# Patient Record
Sex: Female | Born: 1945
Health system: Southern US, Community
[De-identification: ages and names within clinical notes are randomized; demographics above are authoritative.]

## PROBLEM LIST (undated history)

## (undated) VITALS — BP 106/72 | HR 101 | Temp 97.9°F | Resp 16 | Ht 62.0 in | Wt 181.0 lb

## (undated) DIAGNOSIS — K219 Gastro-esophageal reflux disease without esophagitis: Secondary | ICD-10-CM

## (undated) DIAGNOSIS — K635 Polyp of colon: Secondary | ICD-10-CM

## (undated) DIAGNOSIS — E039 Hypothyroidism, unspecified: Secondary | ICD-10-CM

## (undated) DIAGNOSIS — K5792 Diverticulitis of intestine, part unspecified, without perforation or abscess without bleeding: Secondary | ICD-10-CM

## (undated) DIAGNOSIS — H269 Unspecified cataract: Secondary | ICD-10-CM

## (undated) DIAGNOSIS — G4730 Sleep apnea, unspecified: Secondary | ICD-10-CM

## (undated) DIAGNOSIS — K317 Polyp of stomach and duodenum: Secondary | ICD-10-CM

## (undated) DIAGNOSIS — N390 Urinary tract infection, site not specified: Secondary | ICD-10-CM

## (undated) DIAGNOSIS — R112 Nausea with vomiting, unspecified: Secondary | ICD-10-CM

## (undated) DIAGNOSIS — N809 Endometriosis, unspecified: Secondary | ICD-10-CM

## (undated) DIAGNOSIS — O039 Complete or unspecified spontaneous abortion without complication: Secondary | ICD-10-CM

## (undated) DIAGNOSIS — D509 Iron deficiency anemia, unspecified: Secondary | ICD-10-CM

## (undated) DIAGNOSIS — C189 Malignant neoplasm of colon, unspecified: Secondary | ICD-10-CM

## (undated) DIAGNOSIS — F330 Major depressive disorder, recurrent, mild: Secondary | ICD-10-CM

## (undated) DIAGNOSIS — C801 Malignant (primary) neoplasm, unspecified: Secondary | ICD-10-CM

## (undated) DIAGNOSIS — T7840XA Allergy, unspecified, initial encounter: Secondary | ICD-10-CM

## (undated) DIAGNOSIS — M797 Fibromyalgia: Secondary | ICD-10-CM

## (undated) DIAGNOSIS — D649 Anemia, unspecified: Secondary | ICD-10-CM

## (undated) DIAGNOSIS — K649 Unspecified hemorrhoids: Secondary | ICD-10-CM

## (undated) DIAGNOSIS — C4490 Unspecified malignant neoplasm of skin, unspecified: Secondary | ICD-10-CM

## (undated) DIAGNOSIS — Z9889 Other specified postprocedural states: Secondary | ICD-10-CM

## (undated) HISTORY — DX: Polyp of stomach and duodenum: K31.7

## (undated) HISTORY — DX: Fibromyalgia: M79.7

## (undated) HISTORY — DX: Unspecified cataract: H26.9

## (undated) HISTORY — DX: Diverticulitis of intestine, part unspecified, without perforation or abscess without bleeding: K57.92

## (undated) HISTORY — DX: Complete or unspecified spontaneous abortion without complication: O03.9

## (undated) HISTORY — DX: Allergy, unspecified, initial encounter: T78.40XA

## (undated) HISTORY — DX: Unspecified hemorrhoids: K64.9

## (undated) HISTORY — DX: Major depressive disorder, recurrent, mild: F33.0

## (undated) HISTORY — DX: Unspecified malignant neoplasm of skin, unspecified: C44.90

## (undated) HISTORY — DX: Polyp of colon: K63.5

## (undated) HISTORY — PX: COLON SURGERY: SHX602

## (undated) HISTORY — DX: Endometriosis, unspecified: N80.9

## (undated) HISTORY — PX: EYE SURGERY: SHX253

## (undated) HISTORY — DX: Malignant neoplasm of colon, unspecified: C18.9

## (undated) HISTORY — PX: TONSILLECTOMY: SUR1361

## (undated) HISTORY — DX: Urinary tract infection, site not specified: N39.0

## (undated) HISTORY — DX: Iron deficiency anemia, unspecified: D50.9

---

## 1988-05-04 HISTORY — PX: ABDOMINAL HYSTERECTOMY: SHX81

## 1999-05-05 HISTORY — PX: CHOLECYSTECTOMY: SHX55

## 2002-05-04 HISTORY — PX: BREAST BIOPSY: SHX20

## 2004-12-03 ENCOUNTER — Ambulatory Visit: Payer: Self-pay

## 2004-12-09 ENCOUNTER — Ambulatory Visit: Payer: Self-pay

## 2006-02-17 ENCOUNTER — Ambulatory Visit: Payer: Self-pay

## 2007-06-29 ENCOUNTER — Ambulatory Visit: Payer: Self-pay | Admitting: *Deleted

## 2007-07-05 ENCOUNTER — Ambulatory Visit: Payer: Self-pay | Admitting: *Deleted

## 2008-01-30 ENCOUNTER — Ambulatory Visit: Payer: Self-pay | Admitting: *Deleted

## 2008-07-25 ENCOUNTER — Ambulatory Visit: Payer: Self-pay | Admitting: Internal Medicine

## 2008-11-06 ENCOUNTER — Ambulatory Visit: Payer: Self-pay | Admitting: Gastroenterology

## 2008-11-12 ENCOUNTER — Ambulatory Visit: Payer: Self-pay | Admitting: Gastroenterology

## 2009-12-03 ENCOUNTER — Ambulatory Visit: Payer: Self-pay | Admitting: Internal Medicine

## 2010-12-31 ENCOUNTER — Ambulatory Visit: Payer: Self-pay | Admitting: Internal Medicine

## 2011-01-08 ENCOUNTER — Ambulatory Visit: Payer: Self-pay | Admitting: Internal Medicine

## 2011-04-01 ENCOUNTER — Ambulatory Visit: Payer: Self-pay | Admitting: Internal Medicine

## 2012-04-06 ENCOUNTER — Ambulatory Visit: Payer: Self-pay | Admitting: Internal Medicine

## 2012-05-04 HISTORY — PX: CATARACT EXTRACTION, BILATERAL: SHX1313

## 2012-05-16 DIAGNOSIS — H2510 Age-related nuclear cataract, unspecified eye: Secondary | ICD-10-CM | POA: Diagnosis not present

## 2012-05-18 DIAGNOSIS — G4710 Hypersomnia, unspecified: Secondary | ICD-10-CM | POA: Diagnosis not present

## 2012-05-18 DIAGNOSIS — G4730 Sleep apnea, unspecified: Secondary | ICD-10-CM | POA: Diagnosis not present

## 2012-05-23 ENCOUNTER — Ambulatory Visit: Payer: Self-pay | Admitting: Ophthalmology

## 2012-05-23 DIAGNOSIS — F329 Major depressive disorder, single episode, unspecified: Secondary | ICD-10-CM | POA: Diagnosis not present

## 2012-05-23 DIAGNOSIS — H2510 Age-related nuclear cataract, unspecified eye: Secondary | ICD-10-CM | POA: Diagnosis not present

## 2012-05-23 DIAGNOSIS — H25019 Cortical age-related cataract, unspecified eye: Secondary | ICD-10-CM | POA: Diagnosis not present

## 2012-05-23 DIAGNOSIS — Z79899 Other long term (current) drug therapy: Secondary | ICD-10-CM | POA: Diagnosis not present

## 2012-05-23 DIAGNOSIS — Z87891 Personal history of nicotine dependence: Secondary | ICD-10-CM | POA: Diagnosis not present

## 2012-05-23 DIAGNOSIS — K219 Gastro-esophageal reflux disease without esophagitis: Secondary | ICD-10-CM | POA: Diagnosis not present

## 2012-05-23 DIAGNOSIS — G4730 Sleep apnea, unspecified: Secondary | ICD-10-CM | POA: Diagnosis not present

## 2012-05-23 DIAGNOSIS — Z885 Allergy status to narcotic agent status: Secondary | ICD-10-CM | POA: Diagnosis not present

## 2012-05-23 DIAGNOSIS — M129 Arthropathy, unspecified: Secondary | ICD-10-CM | POA: Diagnosis not present

## 2012-05-23 DIAGNOSIS — H269 Unspecified cataract: Secondary | ICD-10-CM | POA: Diagnosis not present

## 2012-05-23 DIAGNOSIS — E079 Disorder of thyroid, unspecified: Secondary | ICD-10-CM | POA: Diagnosis not present

## 2012-06-02 DIAGNOSIS — E559 Vitamin D deficiency, unspecified: Secondary | ICD-10-CM | POA: Diagnosis not present

## 2012-06-02 DIAGNOSIS — E039 Hypothyroidism, unspecified: Secondary | ICD-10-CM | POA: Diagnosis not present

## 2012-06-02 DIAGNOSIS — R5381 Other malaise: Secondary | ICD-10-CM | POA: Diagnosis not present

## 2012-06-02 DIAGNOSIS — E509 Vitamin A deficiency, unspecified: Secondary | ICD-10-CM | POA: Diagnosis not present

## 2012-07-15 ENCOUNTER — Encounter (HOSPITAL_COMMUNITY): Payer: Self-pay | Admitting: Licensed Clinical Social Worker

## 2012-07-15 ENCOUNTER — Inpatient Hospital Stay (HOSPITAL_COMMUNITY)
Admission: RE | Admit: 2012-07-15 | Discharge: 2012-07-19 | DRG: 885 | Disposition: A | Discharge status: Home / Self Care | Payer: Medicare Other | Attending: Psychiatry | Admitting: Psychiatry

## 2012-07-15 DIAGNOSIS — Z79899 Other long term (current) drug therapy: Secondary | ICD-10-CM

## 2012-07-15 DIAGNOSIS — G4730 Sleep apnea, unspecified: Secondary | ICD-10-CM | POA: Diagnosis present

## 2012-07-15 DIAGNOSIS — R45851 Suicidal ideations: Secondary | ICD-10-CM | POA: Diagnosis not present

## 2012-07-15 DIAGNOSIS — E039 Hypothyroidism, unspecified: Secondary | ICD-10-CM | POA: Diagnosis not present

## 2012-07-15 DIAGNOSIS — F411 Generalized anxiety disorder: Secondary | ICD-10-CM | POA: Diagnosis not present

## 2012-07-15 DIAGNOSIS — F332 Major depressive disorder, recurrent severe without psychotic features: Principal | ICD-10-CM | POA: Diagnosis present

## 2012-07-15 DIAGNOSIS — K219 Gastro-esophageal reflux disease without esophagitis: Secondary | ICD-10-CM | POA: Diagnosis present

## 2012-07-15 HISTORY — DX: Sleep apnea, unspecified: G47.30

## 2012-07-15 HISTORY — DX: Hypothyroidism, unspecified: E03.9

## 2012-07-15 HISTORY — DX: Gastro-esophageal reflux disease without esophagitis: K21.9

## 2012-07-15 MED ORDER — PANTOPRAZOLE SODIUM 40 MG PO TBEC
80.0000 mg | DELAYED_RELEASE_TABLET | Freq: Every day | ORAL | Status: DC
  Administered 2012-07-16 – 2012-07-19 (×4): 80 mg via ORAL
  Filled 2012-07-15 (×6): qty 2

## 2012-07-15 MED ORDER — THYROID 30 MG PO TABS
150.0000 mg | ORAL_TABLET | Freq: Every day | ORAL | Status: DC
  Administered 2012-07-16 – 2012-07-19 (×4): 150 mg via ORAL
  Filled 2012-07-15 (×7): qty 1

## 2012-07-15 MED ORDER — ACETAMINOPHEN 325 MG PO TABS: 650.0000 mg | ORAL_TABLET | Freq: Four times a day (QID) | ORAL | Status: DC | PRN

## 2012-07-15 MED ORDER — PNEUMOCOCCAL VAC POLYVALENT 25 MCG/0.5ML IJ INJ: 0.5000 mL | INJECTION | INTRAMUSCULAR | Status: AC

## 2012-07-15 MED ORDER — ESTRADIOL 1 MG PO TABS
0.5000 mg | ORAL_TABLET | Freq: Every day | ORAL | Status: DC
  Administered 2012-07-16 – 2012-07-17 (×2): 0.5 mg via ORAL
  Administered 2012-07-18: 09:00:00 via ORAL
  Administered 2012-07-19: 0.5 mg via ORAL
  Filled 2012-07-15 (×6): qty 0.5

## 2012-07-15 MED ORDER — DULOXETINE HCL 60 MG PO CPEP
60.0000 mg | ORAL_CAPSULE | Freq: Every day | ORAL | Status: DC
  Administered 2012-07-16: 60 mg via ORAL
  Filled 2012-07-15 (×3): qty 1

## 2012-07-15 MED ORDER — MAGNESIUM HYDROXIDE 400 MG/5ML PO SUSP: 30.0000 mL | Freq: Every day | ORAL | Status: DC | PRN

## 2012-07-15 MED ORDER — ALUM & MAG HYDROXIDE-SIMETH 200-200-20 MG/5ML PO SUSP: 30.0000 mL | ORAL | Status: DC | PRN

## 2012-07-15 NOTE — Progress Notes (Signed)
Patient ID: Marisabel Macpherson, female   DOB: 1946-01-07, 67 y.o.   MRN: [id number] Admission note: D:Patient is a  walk -in admission in no acute distress for increase depression and suicide thoughts to overdose on her sleeping pills. Pt stated the reason she did not go through is "too tired to get to the medicine and her grandson".  Pt stated she has dealt with depression since age 89 and have had thoughts of suicide in the past. Pt reports this is the closest she has come to committing suicide. Pt stated there is no reason why she is depressed because she has supportive husband, children, and friends. Pt report mental illness in family with her brother committing suicide years ago. Pt reports seen by her internist Dr. Allena Napoleon and has been prescribed many antidepressants in the past. Pt stated the antidepressant work for awhile and it become less effective. Pt is currently taking cymbalta 60 mg daily. Pt endorse SI but contracts for safety.  A: Pt admitted to unit per protocol, skin assessment and search done.  Pt educated on therapeutic milieu rules. Pt was introduced to milieu by nursing staff. Fall risk safety plan explained to the patient.   R: Pt was receptive to education. Pt contracted for safety, 15 min safety checks started. Writer offered support

## 2012-07-15 NOTE — BH Assessment (Addendum)
Assessment Note   [name] is an 67 y.o. female, married, white who presents to [facility] for assessment accompanied by her husband, who participated in the assessment at the Pt's request. Pt was referred for assessment by her PCP, Mia Creek, MD, who called this [facility] and said that Pt was "severely and profoundly depressed" and experiencing suicidal ideation and would require inpatient psychiatric treatment. Pt reports she has experienced depressive episode all her life and that this was the worst she has ever felt. She says that she began feeling extremely depressed 2-3 weeks ago. Her symptoms include crying spells, increased sleep, staying in bed, social isolation, anhedonia and feelings of "darkness" and hopelessness. Pt and husband report Pt is sleeping 16-18 hours daily, not just staying in bed but sleeping. She states she has poor appetite and has lost approximately 5 lbs over the past month. She reports she has always told herself she wouldn't consider suicide but now is having thoughts of overdosing on her medications. "I have pills... Something just has to happen." She reports one previous suicide attempt when she was an adolescent and when asked what precipitated that attempt she reports "nothing, I was just depressed." Pt reports she had a brother who committed suicide.  Pt denies homicidal ideation or a history of violence. She denies auditory or visual hallucinations. She denies delusions or feeling of paranoia. She denies alcohol or substance abuse, although she say she "drank a lot" in her early twenties. She reports medical problems including hypothyroidism, GERD and sleep apnea (Pt uses CPAP). She reports she is compliant with her current medications which are: Cymbalta 60 mg daily, omeprazole 40 mg daily, estradiol 0.5 mg daily, armour thyroid 150 mg daily. She denies any recent illness or injury.  Pt cannot identify any stressors. She states "my life is really much less  stressful than most other people's." She report her husband is supportive and they have 3 adult sons and grandchildren. She denies any history of abuse or trauma. She reports her brother who committed suicide had depression and substance abuse issues and she suspects her mother had some undiagnosed mental health problem.   Pt denies any history of inpatient psychiatric treatment. She reports she has seen therapists and psychiatrists in the past on an outpatient basis but not currently. She reports she has been on psychiatric medications in the past including Abilify (which was too sedating), Pamelor, Prozac, Wellbutrin and a combination of Effexor and Adderall (which was effective for a period of time). Pt reports she has been on Cymbalta "for a couple of years."  Pt's husband reports Pt's last depressive episode was from 10/2011-12/2011. In 01/2012 Pt saw her PCP who adjusted her medications and Pt's symptoms improved. Pt's husband reports she seemed happy until 2-3 weeks ago and "this is the worst I have ever seen her."  Pt is neatly dressed, alert, oriented x4 with soft speech and somewhat tremulous motor behavior. Her thought process is coherent and goal directed. There appear no deficits in memory or concentration. Her mood is depressed and affect is depressed and somewhat anxious. She appeared to be fighting back tears during the entire assessment.    Axis I: 296.33 Major Depressive Disorder, Recurrent, Severe Without Psychotic Features Axis II: Deferred Axis III:  Past Medical History  Diagnosis Date  . Hypothyroidism   . GERD (gastroesophageal reflux disease)   . Sleep apnea    Axis IV: Chronic mental health problems Axis V: GAF=30  Past Medical History:  Past  Medical History  Diagnosis Date  . Hypothyroidism   . GERD (gastroesophageal reflux disease)   . Sleep apnea     No past surgical history on file.  Family History: No family history on file.  Social History:  reports  that she has never smoked. She does not have any smokeless tobacco history on file. She reports that she does not drink alcohol or use illicit drugs.  Additional Social History:  Alcohol / Drug Use Pain Medications: Denies Prescriptions: Denies Over the Counter: Denies History of alcohol / drug use?: No history of alcohol / drug abuse Longest period of sobriety (when/how long): NA  CIWA: CIWA-Ar BP: 138/73 mmHg Pulse Rate: 85 COWS:    Allergies:  Allergies  Allergen Reactions  . Codeine     Home Medications:  No prescriptions prior to admission    OB/GYN Status:  No LMP recorded.  General Assessment Data [facility] Living Arrangements: Spouse/significant other Can pt return to current living arrangement?: Yes Admission Status: Voluntary Is patient capable of signing voluntary admission?: Yes Transfer from: Home Referral Source: MD Mia Creek, MD)  Education Status Is patient currently in school?: No Current Grade: NA Highest grade of school patient has completed: NA Name of school: NA Contact person: NA  Risk to self Suicidal Ideation: Yes-Currently Present Suicidal Intent: Yes-Currently Present Is patient at risk for suicide?: Yes Suicidal Plan?: Yes-Currently Present Specify Current Suicidal Plan: Thoughts of overdosing on her medications Access to Means: Yes Specify Access to Suicidal Means: Access to multiple prescription medications What has been your use of drugs/alcohol within the last 12 months?: Denies Previous Attempts/Gestures: Yes How many times?: 1 (Overdose at [employer] in suicide attempt) Other Self Harm Risks: None Triggers for Past Attempts: Other (Comment) ("just feeling depressed") Intentional Self Injurious Behavior: None Family Suicide History: Yes (Brother committed suicide) Recent stressful life event(s): Other (Comment) (Pt cannot identify any stressors) Persecutory voices/beliefs?:  No Depression: Yes Depression Symptoms: Despondent;Tearfulness;Isolating;Fatigue;Guilt;Loss of interest in usual pleasures;Feeling worthless/self pity;Feeling angry/irritable Substance abuse history and/or treatment for substance abuse?: No Suicide prevention information given to non-admitted patients: Not applicable  Risk to Others Homicidal Ideation: No Thoughts of Harm to Others: No Current Homicidal Intent: No Current Homicidal Plan: No Access to Homicidal Means: No Identified Victim: None History of harm to others?: No Assessment of Violence: None Noted Violent Behavior Description: None Does patient have access to weapons?: No Criminal Charges Pending?: No Does patient have a court date: No  Psychosis Hallucinations: None noted Delusions: None noted  Mental Status Report Appear/Hygiene: Other (Comment) (Neatly dressed) Eye Contact: Good Motor Activity: Tremors Speech: Logical/coherent;Soft Level of Consciousness: Alert;Crying Mood: Depressed;Sad Affect: Depressed Anxiety Level: Minimal Thought Processes: Coherent;Relevant Judgement: Unimpaired Orientation: Person;Place;Time;Situation Obsessive Compulsive Thoughts/Behaviors: None  Cognitive Functioning Concentration: Normal Memory: Recent Intact;Remote Intact IQ: Average Insight: Fair Impulse Control: Good Appetite: Poor Weight Loss: 5 Weight Gain: 0 Sleep: Increased Total Hours of Sleep: 16 Vegetative Symptoms: Staying in bed  ADLScreening [facility]) Patient's cognitive ability adequate to safely complete daily activities?: Yes Patient able to express need for assistance with ADLs?: Yes Independently performs ADLs?: Yes (appropriate for developmental age)  Abuse/Neglect [facility]) Physical Abuse: Denies Verbal Abuse: Denies Sexual Abuse: Denies  Prior Inpatient Therapy Prior Inpatient Therapy: No Prior Therapy Dates: NA Prior Therapy Facilty/Provider(s): NA Reason for Treatment:  NA  Prior Outpatient Therapy Prior Outpatient Therapy: Yes Prior Therapy Dates: Various times throughout her life Prior Therapy Facilty/Provider(s): Various providers Reason for  Treatment: Depression  ADL Screening (condition at time of admission) Patient's cognitive ability adequate to safely complete daily activities?: Yes Patient able to express need for assistance with ADLs?: Yes Independently performs ADLs?: Yes (appropriate for developmental age) Weakness of Legs: None Weakness of Arms/Hands: None  Home Assistive Devices/Equipment Home Assistive Devices/Equipment: CPAP  Therapy Consults (therapy consults require a physician order) PT Evaluation Needed: No OT Evalulation Needed: No SLP Evaluation Needed: No Abuse/Neglect Assessment (Assessment to be complete while patient is alone) Physical Abuse: Denies Verbal Abuse: Denies Sexual Abuse: Denies Exploitation of patient/patient's resources: Denies Self-Neglect: Denies     Merchant navy officer (For Healthcare) Advance Directive: Patient does not have advance directive;Patient would not like information Pre-existing out of facility DNR order (yellow form or pink MOST form): No Nutrition Screen- [facility] Adult/WL/AP Patient's home diet: Regular Have you recently lost weight without trying?: No Have you been eating poorly because of a decreased appetite?: No Malnutrition Screening Tool Score: 0  Additional Information 1:1 In Past 12 Months?: No CIRT Risk: No Elopement Risk: No Does patient have medical clearance?: No     Disposition:  Disposition Initial Assessment Completed: Yes Disposition of Patient: Inpatient treatment program Type of inpatient treatment program: Adult  On Site Evaluation by:   Reviewed with Physician:  Assunta Found, FNP  Harlin Rain Patsy [place], [facility], [facility] Assessment Counselor    Pamalee Leyden 07/15/2012 7:55 PM

## 2012-07-15 NOTE — Tx Team (Signed)
Initial Interdisciplinary Treatment Plan  PATIENT STRENGTHS: (choose at least two) Ability for insight Average or above average intelligence Capable of independent living Communication skills Motivation for treatment/growth Supportive family/friends  PATIENT STRESSORS: Health problems   PROBLEM LIST: Problem List/Patient Goals Date to be addressed Date deferred Reason deferred Estimated date of resolution  depression 07/15/2012     suicidal thoughts 07/15/2012     anxiety 07/15/2012                                          DISCHARGE CRITERIA:  Ability to meet basic life and health needs Improved stabilization in mood, thinking, and/or behavior Medical problems require only outpatient monitoring Motivation to continue treatment in a less acute level of care Need for constant or close observation no longer present  PRELIMINARY DISCHARGE PLAN: Attend aftercare/continuing care group Attend PHP/IOP Outpatient therapy Return to previous living arrangement  PATIENT/FAMIILY INVOLVEMENT: This treatment plan has been presented to and reviewed with the patient, [name], and/or family member,   The patient and family have been given the opportunity to ask questions and make suggestions.  JEHU-APPIAH, LINDA K 07/15/2012, 11:17 PM

## 2012-07-16 ENCOUNTER — Encounter (HOSPITAL_COMMUNITY): Payer: Self-pay | Admitting: Registered Nurse

## 2012-07-16 LAB — URINALYSIS, ROUTINE W REFLEX MICROSCOPIC
Ketones, ur: NEGATIVE mg/dL
Leukocytes, UA: NEGATIVE
Nitrite: NEGATIVE
Protein, ur: NEGATIVE mg/dL
pH: 5.5 (ref 5.0–8.0)

## 2012-07-16 LAB — CBC
MCH: 30.2 pg (ref 26.0–34.0)
MCHC: 34.6 g/dL (ref 30.0–36.0)
MCV: 87.1 fL (ref 78.0–100.0)
Platelets: 329 10*3/uL (ref 150–400)
RBC: 4.74 MIL/uL (ref 3.87–5.11)

## 2012-07-16 LAB — COMPREHENSIVE METABOLIC PANEL
ALT: 18 U/L (ref 0–35)
AST: 13 U/L (ref 0–37)
Albumin: 3.3 g/dL — ABNORMAL LOW (ref 3.5–5.2)
Alkaline Phosphatase: 82 U/L (ref 39–117)
CO2: 30 mEq/L (ref 19–32)
Chloride: 103 mEq/L (ref 96–112)
Creatinine, Ser: 0.87 mg/dL (ref 0.50–1.10)
GFR calc non Af Amer: 68 mL/min — ABNORMAL LOW (ref >90)
Potassium: 3.9 mEq/L (ref 3.5–5.1)
Sodium: 139 mEq/L (ref 135–145)
Total Bilirubin: 0.4 mg/dL (ref 0.3–1.2)

## 2012-07-16 LAB — TSH: TSH: <0.008 u[IU]/mL — ABNORMAL LOW (ref 0.350–4.500)

## 2012-07-16 LAB — URINE MICROSCOPIC-ADD ON

## 2012-07-16 LAB — RAPID URINE DRUG SCREEN, HOSP PERFORMED
Amphetamines: NOT DETECTED
Opiates: NOT DETECTED

## 2012-07-16 MED ORDER — VENLAFAXINE HCL ER 37.5 MG PO CP24
37.5000 mg | ORAL_CAPSULE | Freq: Every day | ORAL | Status: DC
  Administered 2012-07-17: 37.5 mg via ORAL
  Filled 2012-07-16 (×3): qty 1

## 2012-07-16 MED ORDER — DULOXETINE HCL 30 MG PO CPEP
30.0000 mg | ORAL_CAPSULE | Freq: Every day | ORAL | Status: DC
  Administered 2012-07-17: 30 mg via ORAL
  Filled 2012-07-16 (×3): qty 1

## 2012-07-16 NOTE — Progress Notes (Signed)
Found pt resting in bed where she's been all evening with the exception of wrap up group. She is pleasant but remains flat, depressed in affect and mood.  Reports good visit with husband today. She states, "he's the one who first detected something was wrong with me, that I was worsening." "I have 3 boys and grandchildren that I have to be well for." Support, encouragement given. Pt denies needs at this time. Denies SI/HI/AVH and remains safe. Lawrence Marseilles

## 2012-07-16 NOTE — Progress Notes (Signed)
D Genesi is seen out in the milieu this AM. [facility] is cooperative, takes her medications as ordered, is tearful, but is trying to be present..here...and begin to address her depression .   A She completed her AM self inventory early this morning, on it she wrote she denied SI within the past 24 hrs, she rated her depression and hopelessness " 7 / 5 " and she stated her DC plan is to " take my meds". She is attending her groups as scheduled and she is trying to understand her disease and learn to develop healthier coping skills.   R Safety is in place and POC cont with therapeutic relationship fostered.

## 2012-07-16 NOTE — Progress Notes (Signed)
[facility] MD Progress Note  07/16/2012 3:01 PM [name]  MRN:  [id number] Subjective:  Patient states related to bed and pillow that she wasn't able to sleep last night.  States that her problem is that she sleeps to much.  Patient states she was also given Danocrine once about 30 yrs "And from one day to the next I could really tell the difference.  It is the first time in my life that I felt like a normal person.  All of the hopelessness,and worthlessness went out of the window and it is the first time in my life that I ever had an orgasm."     Diagnosis:  Axis I: Major Depression, Recurrent severe  ADL's:  Intact  Sleep: Fair Appetite:  Poor Patient states "MY appetite is not to good which is unusual for me." Suicidal Ideation:  Yes Plan:  overdose Means:  sleeping pills Homicidal Ideation:  Denies AEB (as evidenced by):  Patient is participating in group secessions and states that they are going food for her.  Psychiatric Specialty Exam: Review of Systems  Constitutional: Positive for malaise/fatigue. Negative for fever, chills, weight loss and diaphoresis.  HENT: Negative.  Negative for neck pain.   Eyes: Negative.   Respiratory: Negative.   Cardiovascular: Negative.   Gastrointestinal: Positive for heartburn. Negative for nausea, vomiting, abdominal pain, diarrhea, constipation, blood in stool and melena.  Genitourinary: Negative.   Musculoskeletal: Positive for joint pain (Patient states that she has arthuritis in hips, hands and knees and has see a physician related to). Negative for myalgias, back pain and falls.  Skin: Negative.   Neurological: Negative.  Negative for weakness.  Endo/Heme/Allergies: Negative.   Psychiatric/Behavioral: Positive for depression (Patient states that she has had depression since she was 67 yr old.  Rates 10 at check in which is the worse she has ever been and today 9/10) and suicidal ideas (Denies today.  The last SI thought was Thursday  07/14/12.    States that she has sleeping pill that she would use.). Negative for hallucinations and substance abuse. Nervous/anxious: Rates 1-2/10.  "I get really anxious when I'm really tired.        Patient states that she has been on cymbalta for a while and started out good but now she is feeling worse than she ever has.  Patient states that she has also tried Effexor XR and it worked but stopped related to weight gain.  Patient alsa states that she has a history of thyroid problems and is taking Armour but feels that her problem is coming from more than just thyroid.    Blood pressure 123/67, pulse 104, temperature 98.5 F (36.9 C), temperature source Oral, resp. rate 20, height 5\' 2"  (1.575 m), weight 82.101 kg (181 lb).Body mass index is 33.1 kg/(m^2).  General Appearance: Casual and Fairly Groomed  Patent attorney::  Fair  Speech:  Clear and Coherent and Normal Rate  Volume:  Normal  Mood:  Anxious and Depressed  Affect:  Depressed and Flat  Thought Process:  Goal Directed  Orientation:  Full (Time, Place, and Person)  Thought Content:  WDL  Suicidal Thoughts:  Yes.  with intent/plan  Homicidal Thoughts:  No  Memory:  Immediate;   Good Recent;   Good Remote;   Good  Judgement:  Fair  Insight:  Fair  Psychomotor Activity:  Normal  Concentration:  Good  Recall:  Good  Akathisia:  No  Handed:  Right  AIMS (if indicated):  Assets:  Communication Skills Desire for Improvement Housing Physical Health Social Support Transportation  Sleep:  Number of Hours: 6.75   Current Medications: Current Facility-Administered Medications  Medication Dose Route Frequency Provider Last Rate Last Dose  . acetaminophen (TYLENOL) tablet 650 mg  650 mg Oral Q6H PRN Shuvon Rankin, NP      . alum & mag hydroxide-simeth (MAALOX/MYLANTA) 200-200-20 MG/5ML suspension 30 mL  30 mL Oral Q4H PRN Shuvon Rankin, NP      . DULoxetine (CYMBALTA) DR capsule 60 mg  60 mg Oral Daily Shuvon Rankin, NP   60 mg  at 07/16/12 0913  . estradiol (ESTRACE) tablet 0.5 mg  0.5 mg Oral Daily Shuvon Rankin, NP   0.5 mg at 07/16/12 0913  . magnesium hydroxide (MILK OF MAGNESIA) suspension 30 mL  30 mL Oral Daily PRN Shuvon Rankin, NP      . pantoprazole (PROTONIX) EC tablet 80 mg  80 mg Oral Daily Shuvon Rankin, NP   80 mg at 07/16/12 0913  . pneumococcal 23 valent vaccine (PNU-IMMUNE) injection 0.5 mL  0.5 mL Intramuscular Tomorrow-1000 Himabindu Ravi, MD      . thyroid (ARMOUR) tablet 150 mg  150 mg Oral QAC breakfast Shuvon Rankin, NP   150 mg at 07/16/12 9147    Lab Results:  Results for orders placed during the hospital encounter of 07/15/12 (from the past 48 hour(s))  COMPREHENSIVE METABOLIC PANEL     Status: Abnormal   Collection Time    07/16/12  6:31 AM      Result Value Range   Sodium 139  135 - 145 mEq/L   Potassium 3.9  3.5 - 5.1 mEq/L   Chloride 103  96 - 112 mEq/L   CO2 30  19 - 32 mEq/L   Glucose, Bld 100 (*) 70 - 99 mg/dL   BUN 15  6 - 23 mg/dL   Creatinine, Ser 8.29  0.50 - 1.10 mg/dL   Calcium 9.7  8.4 - 56.2 mg/dL   Total Protein 6.5  6.0 - 8.3 g/dL   Albumin 3.3 (*) 3.5 - 5.2 g/dL   AST 13  0 - 37 U/L   ALT 18  0 - 35 U/L   Alkaline Phosphatase 82  39 - 117 U/L   Total Bilirubin 0.4  0.3 - 1.2 mg/dL   GFR calc non Af Amer 68 (*) >90 mL/min   GFR calc Af Amer 79 (*) >90 mL/min   Comment:            The eGFR has been calculated     using the CKD EPI equation.     This calculation has not been     validated in all clinical     situations.     eGFR's persistently     <90 mL/min signify     possible Chronic Kidney Disease.  CBC     Status: None   Collection Time    07/16/12  6:31 AM      Result Value Range   WBC 8.2  4.0 - 10.5 K/uL   RBC 4.74  3.87 - 5.11 MIL/uL   Hemoglobin 14.3  12.0 - 15.0 g/dL   HCT 13.0  86.5 - 78.4 %   MCV 87.1  78.0 - 100.0 fL   MCH 30.2  26.0 - 34.0 pg   MCHC 34.6  30.0 - 36.0 g/dL   RDW 69.6  29.5 - 28.4 %   Platelets 329  150 - 400 K/uL  Physical Findings: AIMS: Facial and Oral Movements Muscles of Facial Expression: None, normal Lips and Perioral Area: None, normal Jaw: None, normal Tongue: None, normal,Extremity Movements Upper (arms, wrists, hands, fingers): Minimal Lower (legs, knees, ankles, toes): Minimal, Trunk Movements Neck, shoulders, hips: None, normal, Overall Severity Severity of abnormal movements (highest score from questions above): Minimal Incapacitation due to abnormal movements: None, normal Patient's awareness of abnormal movements (rate only patient's report): Aware, no distress, Dental Status Current problems with teeth and/or dentures?: Yes Does patient usually wear dentures?: No  CIWA:    COWS:     Treatment Plan Summary: Daily contact with patient to assess and evaluate symptoms and progress in treatment Medication management  Plan:  Start Effexor 37.5 mg QD and will titrate Cymbalta DR  down starting by decreasing to 30 mg tomorrow.  Continue all other treatment plans.    Medical Decision Making Problem Points:  Established problem, worsening (2), Review of psycho-social stressors (1) and Self-limited or minor (1) Data Points:  Independent review of image, tracing, or specimen (2) Review or order clinical lab tests (1) Review or order medicine tests (1) Review of new medications or change in dosage (2) Review or order of Psychological tests (1)  I certify that inpatient services furnished can reasonably be expected to improve the patient's condition.   Rankin, Shuvon 07/16/2012, 3:01 PM  Discussed with provider and agree with above findings.   Jacqulyn Cane, M.D.  07/16/2012 10:22 PM

## 2012-07-16 NOTE — Clinical Social Work Note (Signed)
[facility] Group Notes: (Clinical Social Work)   07/16/2012      Type of Therapy:  Group Therapy   Participation Level:  Did Not Attend    Ambrose Mantle, LCSW 07/16/2012, 4:16 PM

## 2012-07-17 DIAGNOSIS — F332 Major depressive disorder, recurrent severe without psychotic features: Principal | ICD-10-CM

## 2012-07-17 MED ORDER — VENLAFAXINE HCL ER 75 MG PO CP24
75.0000 mg | ORAL_CAPSULE | Freq: Every day | ORAL | Status: DC
  Administered 2012-07-18 – 2012-07-19 (×2): 75 mg via ORAL
  Filled 2012-07-17 (×5): qty 1

## 2012-07-17 NOTE — Clinical Social Work Note (Signed)
[facility] Group Notes:  (Clinical Social Work)  07/17/2012   3:00-4:00PM  Summary of Progress/Problems:    The main focus of today's process group was to define "support" and describe what healthy supports are.  We then discussed how and why to increase patient supports, using motivational interviewing.  An emphasis was placed on using counselor, doctor, therapy groups, self-help groups and problem-specific support groups to expand supports.   The patient expressed little during group but stayed alert and observant.  Type of Therapy:  Process Group  Participation Level:  Minimal  Participation Quality:  Attentive  Affect:  Blunted and Depressed  Cognitive:  Oriented  Insight:  Developing/Improving  Engagement in Therapy:  Developing/Improving  Modes of Intervention:  Education,  Support and Processing, Exploration, Discussion   Ambrose Mantle, LCSW 07/17/2012, 4:43 PM

## 2012-07-17 NOTE — Progress Notes (Signed)
[facility] Group Notes:  (Nursing/MHT/Case Management/Adjunct)  Date:  07/16/2012 Time:  2000  Type of Therapy:  Psychoeducational Skills  Participation Level:  Active  Participation Quality:  Attentive  Affect:  Appropriate  Cognitive:  Appropriate  Insight:  Improving  Engagement in Group:  Improving  Modes of Intervention:  Education  Summary of Progress/Problems: The patient shared with the group that she had a good day since she didn't cry. Nothing else was shared with the group regarding her day. Her goal for tomorrow is to sleep less during the daytime.   [name] S 07/17/2012, 12:07 AM

## 2012-07-17 NOTE — Progress Notes (Signed)
Nutrition Brief Note  Patient identified on the Malnutrition Screening Tool (MST) Report  Body mass index is 33.1 kg/(m^2). Patient meets criteria for obesity grade 1 based on current BMI.   Current diet order is regular, patient is consuming good intake of meals at this time. Labs and medications reviewed.   Patient states that she recently started working 10-3, skips lunch and recently lost 5 lbs.  Had been eating to "increase energy" and had been gaining weight.  Patient is obese.  Discussed nutrition with this patient.  Patient able to verbalize.  No nutrition interventions warranted at this time. If nutrition issues arise, please consult RD.   Oran Rein, RD, LDN Clinical Inpatient Dietitian Pager:  [phone] Weekend and after hours pager:  [phone]

## 2012-07-17 NOTE — Progress Notes (Signed)
[name] is seen..mostly lying in her bed in her room today.Marland KitchenMarland KitchenMarland KitchenShe remains sad, depressed and flat and blunted. She requests to be called " [name]". She makes poor eye contact. She attends her groups, is engaged in the groups discussions and is active in her recover.   A She takes her scheduled meds as ordered by her mD. SHe completed her self invnetory and on it she wrote she denied SI within the past 24 hrs, she rated her depression and hopelessness " 5 / 1 " and states her DC plan is to " go to the dr as soon as I start getting deeper into depression".   R Safety is in place and POC cont with therapeutic relationhip fostered.

## 2012-07-17 NOTE — Progress Notes (Signed)
Spoke with pt 1:1 who remains flat and depressed though slightly less so than last night. She has been more visible in the milieu this afternoon and this evening. Pt states, "I feel like the fog has lifted this afternoon. My meds must be kicking in." Pt reports husband and son came to visit which was uplifting. Pt encouraged and praised for progress. Supported and educated on Water engineer upon discharge. She denies SI/HI/AVH at this time and remains safe. No physical complaints voiced, distress noted. Lawrence Marseilles
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
## 2012-07-17 NOTE — Progress Notes (Addendum)
[facility] MD Progress Note  07/17/2012 2:48 PM [name]  MRN:  [id number]  Subjective:  [name] is a 67 y/o woman with a past psychiatric history significant for Major Depression, Recurrent severe. The patient reports she has been on multiple medications in the past. She reports she had been on danocrine in the past which helped, for 6 months but had to stop as is was a steroid. She had been on adderall in the past that also helped for some time and stopped. She states her energy levels are her appetite. She reports that she had been in group therapy and seen a psychiatrist but when she was stabilized she went back to her PCP. She states that she went she starts to deteriorate she has difficult seeking help.     Diagnosis: Major Depressive Disorder, recurrent, severe  ADL's:  Intact  Sleep: Good  Appetite:  Poor  Suicidal Ideation:  Plan:  Patient denies.  Intent:  Patient denies.  Means:  Patient denies.   Homicidal Ideation:  Plan:  Patient denies.  Intent:  Patient denies.  Means:  Patient denies.  AEB (as evidenced by):  Psychiatric Specialty Exam: Review of Systems  Constitutional: Negative for fever, chills and weight loss.  Respiratory: Negative for cough, sputum production and shortness of breath.   Cardiovascular: Negative for chest pain and palpitations.  Gastrointestinal: Positive for nausea. Negative for vomiting, abdominal pain, diarrhea and constipation.  Genitourinary: Negative for dysuria.  Neurological: Negative for dizziness and tingling.    Blood pressure 118/82, pulse 103, temperature 98.5 F (36.9 C), temperature source Oral, resp. rate 16, height 5\' 2"  (1.575 m), weight 82.101 kg (181 lb).Body mass index is 33.1 kg/(m^2).  General Appearance: Casual and Well Groomed  Eye Contact::  Good  Speech:  Clear and Coherent and Normal Rate  Volume:  Normal  Mood:  "extremely tired, extremely depressed."  Affect:  Labile  Thought Process:  Coherent, Linear  and Logical  Orientation:  Full (Time, Place, and Person)  Thought Content:  WDL  Suicidal Thoughts:  Yes.  without intent/plan can contract for safety on the unit.  Homicidal Thoughts:  No  Memory:  Immediate;   Good Recent;   Fair Remote;   Fair  Judgement:  Fair  Insight:  Fair  Psychomotor Activity:  Normal  Concentration:  Fair  Recall:  Fair  Akathisia:  Negative  Handed:  Right  AIMS (if indicated):   Not indicated  Assets:  Communication Skills Desire for Improvement Financial Resources/Insurance Housing Intimacy Social Support Transportation Vocational/Educational  Sleep:  Number of Hours: 6.5   Current Medications: Current Facility-Administered Medications  Medication Dose Route Frequency Provider Last Rate Last Dose  . acetaminophen (TYLENOL) tablet 650 mg  650 mg Oral Q6H PRN Shuvon Rankin, NP      . alum & mag hydroxide-simeth (MAALOX/MYLANTA) 200-200-20 MG/5ML suspension 30 mL  30 mL Oral Q4H PRN Shuvon Rankin, NP      . DULoxetine (CYMBALTA) DR capsule 30 mg  30 mg Oral Daily Shuvon Rankin, NP   30 mg at 07/17/12 0833  . estradiol (ESTRACE) tablet 0.5 mg  0.5 mg Oral Daily Shuvon Rankin, NP   0.5 mg at 07/17/12 0832  . magnesium hydroxide (MILK OF MAGNESIA) suspension 30 mL  30 mL Oral Daily PRN Shuvon Rankin, NP      . pantoprazole (PROTONIX) EC tablet 80 mg  80 mg Oral Daily Shuvon Rankin, NP   80 mg at 07/17/12 0833  . thyroid (  ARMOUR) tablet 150 mg  150 mg Oral QAC breakfast Shuvon Rankin, NP   150 mg at 07/17/12 1610  . venlafaxine XR (EFFEXOR-XR) 24 hr capsule 37.5 mg  37.5 mg Oral Q breakfast Shuvon Rankin, NP   37.5 mg at 07/17/12 9604    Lab Results:  Results for orders placed during the hospital encounter of 07/15/12 (from the past 48 hour(s))  COMPREHENSIVE METABOLIC PANEL     Status: Abnormal   Collection Time    07/16/12  6:31 AM      Result Value Range   Sodium 139  135 - 145 mEq/L   Potassium 3.9  3.5 - 5.1 mEq/L   Chloride 103  96 - 112  mEq/L   CO2 30  19 - 32 mEq/L   Glucose, Bld 100 (*) 70 - 99 mg/dL   BUN 15  6 - 23 mg/dL   Creatinine, Ser 5.40  0.50 - 1.10 mg/dL   Calcium 9.7  8.4 - 98.1 mg/dL   Total Protein 6.5  6.0 - 8.3 g/dL   Albumin 3.3 (*) 3.5 - 5.2 g/dL   AST 13  0 - 37 U/L   ALT 18  0 - 35 U/L   Alkaline Phosphatase 82  39 - 117 U/L   Total Bilirubin 0.4  0.3 - 1.2 mg/dL   GFR calc non Af Amer 68 (*) >90 mL/min   GFR calc Af Amer 79 (*) >90 mL/min   Comment:            The eGFR has been calculated     using the CKD EPI equation.     This calculation has not been     validated in all clinical     situations.     eGFR's persistently     <90 mL/min signify     possible Chronic Kidney Disease.  CBC     Status: None   Collection Time    07/16/12  6:31 AM      Result Value Range   WBC 8.2  4.0 - 10.5 K/uL   RBC 4.74  3.87 - 5.11 MIL/uL   Hemoglobin 14.3  12.0 - 15.0 g/dL   HCT 19.1  47.8 - 29.5 %   MCV 87.1  78.0 - 100.0 fL   MCH 30.2  26.0 - 34.0 pg   MCHC 34.6  30.0 - 36.0 g/dL   RDW 62.1  30.8 - 65.7 %   Platelets 329  150 - 400 K/uL  TSH     Status: Abnormal   Collection Time    07/16/12  6:31 AM      Result Value Range   TSH <0.008 (*) 0.350 - 4.500 uIU/mL  URINE RAPID DRUG SCREEN (HOSP PERFORMED)     Status: None   Collection Time    07/16/12  2:13 PM      Result Value Range   Opiates NONE DETECTED  NONE DETECTED   Cocaine NONE DETECTED  NONE DETECTED   Benzodiazepines NONE DETECTED  NONE DETECTED   Amphetamines NONE DETECTED  NONE DETECTED   Tetrahydrocannabinol NONE DETECTED  NONE DETECTED   Barbiturates NONE DETECTED  NONE DETECTED   Comment:            DRUG SCREEN FOR MEDICAL PURPOSES     ONLY.  IF CONFIRMATION IS NEEDED     FOR ANY PURPOSE, NOTIFY LAB     WITHIN 5 DAYS.  LOWEST DETECTABLE LIMITS     FOR URINE DRUG SCREEN     Drug Class       Cutoff (ng/mL)     Amphetamine      1000     Barbiturate      200     Benzodiazepine   200     Tricyclics       300      Opiates          300     Cocaine          300     THC              50  URINALYSIS, ROUTINE W REFLEX MICROSCOPIC     Status: Abnormal   Collection Time    07/16/12  2:26 PM      Result Value Range   Color, Urine YELLOW  YELLOW   APPearance TURBID (*) CLEAR   Specific Gravity, Urine 1.007  1.005 - 1.030   pH 5.5  5.0 - 8.0   Glucose, UA NEGATIVE  NEGATIVE mg/dL   Hgb urine dipstick NEGATIVE  NEGATIVE   Bilirubin Urine NEGATIVE  NEGATIVE   Ketones, ur NEGATIVE  NEGATIVE mg/dL   Protein, ur NEGATIVE  NEGATIVE mg/dL   Urobilinogen, UA 0.2  0.0 - 1.0 mg/dL   Nitrite NEGATIVE  NEGATIVE   Leukocytes, UA NEGATIVE  NEGATIVE  URINE MICROSCOPIC-ADD ON     Status: Abnormal   Collection Time    07/16/12  2:26 PM      Result Value Range   Squamous Epithelial / LPF MANY (*) RARE   WBC, UA 0-2  <3 WBC/hpf   RBC / HPF 0-2  <3 RBC/hpf   Bacteria, UA FEW (*) RARE    Physical Findings: AIMS: Facial and Oral Movements Muscles of Facial Expression: None, normal Lips and Perioral Area: None, normal Jaw: None, normal Tongue: None, normal,Extremity Movements Upper (arms, wrists, hands, fingers): Minimal Lower (legs, knees, ankles, toes): Minimal, Trunk Movements Neck, shoulders, hips: None, normal, Overall Severity Severity of abnormal movements (highest score from questions above): Minimal Incapacitation due to abnormal movements: None, normal Patient's awareness of abnormal movements (rate only patient's report): Aware, no distress, Dental Status Current problems with teeth and/or dentures?: Yes Does patient usually wear dentures?: No  CIWA:    COWS:     Treatment Plan Summary: Daily contact with patient to assess and evaluate symptoms and progress in treatment Medication management As below.   Plan: Stop Duloxetine 30 mg-taoday Increase Venlafaxine ER 75 mg daily, starting tomorrow.. Will repeat TSH, free T3, free T4 Referral to outpatient psychiatry and therapy prior to  discharge.  Medical Decision Making Problem Points:  Established problem, worsening (2) and Review of psycho-social stressors (1) Data Points:  Review or order clinical lab tests (1) Review of medication regiment & side effects (2) Review of new medications or change in dosage (2)  I certify that inpatient services furnished can reasonably be expected to improve the patient's condition.   Lucile Didonato 07/17/2012, 2:48 PM

## 2012-07-17 NOTE — Progress Notes (Signed)
  Psychoeducational Group Note  Psychoeducational Group Note  Date: 07/17/2012 Time:  07/17/2012  Group Topic/Focus:  Gratefulness:  The focus of this group is to help patients identify what two things they are most grateful for in their lives. What helps ground them and to center them on their work to their recovery.  Participation Level:  Active  Participation Quality:  Appropriate  Affect:  Depressed  Cognitive:  Alert  Insight:  Developing/Improving and Engaged  Engagement in Group:  Engaged  Additional Comments:    Dione Housekeeper

## 2012-07-18 LAB — T4, FREE: Free T4: 1.12 ng/dL (ref 0.80–1.80)

## 2012-07-18 NOTE — Progress Notes (Signed)
[facility] MD Progress Note  07/18/2012 11:12 AM [name]  MRN:  [id number] Subjective:  Patient reports tolerating effexor well, denies any side effects. States her energy levels are low and most recently has gotten worse. She reports that she now knows she needs to seek help earlier when her depression worsens. Reports family is very supportive.  Diagnosis:   Axis I: Major Depression, Recurrent severe Axis II: Deferred Axis III:  Past Medical History  Diagnosis Date  . Hypothyroidism   . GERD (gastroesophageal reflux disease)   . Sleep apnea    Axis IV: other psychosocial or environmental problems Axis V: 41-50 serious symptoms  ADL's:  Intact  Sleep: Fair  Appetite:  Fair   Psychiatric Specialty Exam: Review of Systems  Constitutional: Negative.   HENT: Negative.   Eyes: Negative.   Respiratory: Negative.   Cardiovascular: Negative.   Gastrointestinal: Negative.   Genitourinary: Negative.   Musculoskeletal: Negative.   Skin: Negative.   Neurological: Negative.   Endo/Heme/Allergies: Negative.   Psychiatric/Behavioral: Positive for depression. The patient is nervous/anxious.     Blood pressure 116/64, pulse 101, temperature 98.4 F (36.9 C), temperature source Oral, resp. rate 16, height 5\' 2"  (1.575 m), weight 82.101 kg (181 lb).Body mass index is 33.1 kg/(m^2).  General Appearance: Fairly Groomed  Patent attorney::  Fair  Speech:  Slow  Volume:  Decreased  Mood:  Anxious and Dysphoric  Affect:  Constricted and Flat  Thought Process:  Coherent  Orientation:  Full (Time, Place, and Person)  Thought Content:  WDL  Suicidal Thoughts:  No  Homicidal Thoughts:  No  Memory:  Immediate;   Fair Recent;   Fair Remote;   Fair  Judgement:  Fair  Insight:  Present  Psychomotor Activity:  Decreased  Concentration:  Fair  Recall:  Fair  Akathisia:  No  Handed:  Right  AIMS (if indicated):     Assets:  Communication Skills Desire for  Improvement Housing Resilience Social Support  Sleep:  Number of Hours: 6.75   Current Medications: Current Facility-Administered Medications  Medication Dose Route Frequency Provider Last Rate Last Dose  . acetaminophen (TYLENOL) tablet 650 mg  650 mg Oral Q6H PRN Shuvon Rankin, NP      . alum & mag hydroxide-simeth (MAALOX/MYLANTA) 200-200-20 MG/5ML suspension 30 mL  30 mL Oral Q4H PRN Shuvon Rankin, NP      . estradiol (ESTRACE) tablet 0.5 mg  0.5 mg Oral Daily Shuvon Rankin, NP      . magnesium hydroxide (MILK OF MAGNESIA) suspension 30 mL  30 mL Oral Daily PRN Shuvon Rankin, NP      . pantoprazole (PROTONIX) EC tablet 80 mg  80 mg Oral Daily Shuvon Rankin, NP   80 mg at 07/18/12 0840  . thyroid (ARMOUR) tablet 150 mg  150 mg Oral QAC breakfast Shuvon Rankin, NP   150 mg at 07/18/12 0619  . venlafaxine XR (EFFEXOR-XR) 24 hr capsule 75 mg  75 mg Oral Q breakfast Larena Sox, MD   75 mg at 07/18/12 0840    Lab Results:  Results for orders placed during the hospital encounter of 07/15/12 (from the past 48 hour(s))  URINE RAPID DRUG SCREEN (HOSP PERFORMED)     Status: None   Collection Time    07/16/12  2:13 PM      Result Value Range   Opiates NONE DETECTED  NONE DETECTED   Cocaine NONE DETECTED  NONE DETECTED   Benzodiazepines NONE DETECTED  NONE DETECTED  Amphetamines NONE DETECTED  NONE DETECTED   Tetrahydrocannabinol NONE DETECTED  NONE DETECTED   Barbiturates NONE DETECTED  NONE DETECTED   Comment:            DRUG SCREEN FOR MEDICAL PURPOSES     ONLY.  IF CONFIRMATION IS NEEDED     FOR ANY PURPOSE, NOTIFY LAB     WITHIN 5 DAYS.                LOWEST DETECTABLE LIMITS     FOR URINE DRUG SCREEN     Drug Class       Cutoff (ng/mL)     Amphetamine      1000     Barbiturate      200     Benzodiazepine   200     Tricyclics       300     Opiates          300     Cocaine          300     THC              50  URINALYSIS, ROUTINE W REFLEX MICROSCOPIC     Status:  Abnormal   Collection Time    07/16/12  2:26 PM      Result Value Range   Color, Urine YELLOW  YELLOW   APPearance TURBID (*) CLEAR   Specific Gravity, Urine 1.007  1.005 - 1.030   pH 5.5  5.0 - 8.0   Glucose, UA NEGATIVE  NEGATIVE mg/dL   Hgb urine dipstick NEGATIVE  NEGATIVE   Bilirubin Urine NEGATIVE  NEGATIVE   Ketones, ur NEGATIVE  NEGATIVE mg/dL   Protein, ur NEGATIVE  NEGATIVE mg/dL   Urobilinogen, UA 0.2  0.0 - 1.0 mg/dL   Nitrite NEGATIVE  NEGATIVE   Leukocytes, UA NEGATIVE  NEGATIVE  URINE MICROSCOPIC-ADD ON     Status: Abnormal   Collection Time    07/16/12  2:26 PM      Result Value Range   Squamous Epithelial / LPF MANY (*) RARE   WBC, UA 0-2  <3 WBC/hpf   RBC / HPF 0-2  <3 RBC/hpf   Bacteria, UA FEW (*) RARE  TSH     Status: Abnormal   Collection Time    07/18/12  6:15 AM      Result Value Range   TSH <0.008 (*) 0.350 - 4.500 uIU/mL  T3, FREE     Status: Abnormal   Collection Time    07/18/12  6:15 AM      Result Value Range   T3, Free 4.7 (*) 2.3 - 4.2 pg/mL  T4, FREE     Status: None   Collection Time    07/18/12  6:15 AM      Result Value Range   Free T4 1.12  0.80 - 1.80 ng/dL    Physical Findings: AIMS: Facial and Oral Movements Muscles of Facial Expression: None, normal Lips and Perioral Area: None, normal Jaw: None, normal Tongue: None, normal,Extremity Movements Upper (arms, wrists, hands, fingers): Minimal Lower (legs, knees, ankles, toes): Minimal, Trunk Movements Neck, shoulders, hips: None, normal, Overall Severity Severity of abnormal movements (highest score from questions above): Minimal Incapacitation due to abnormal movements: None, normal Patient's awareness of abnormal movements (rate only patient's report): Aware, no distress, Dental Status Current problems with teeth and/or dentures?: Yes Does patient usually wear dentures?: No  CIWA:    COWS:  Treatment Plan Summary: Daily contact with patient to assess and evaluate  symptoms and progress in treatment Medication management  Plan: Continue current plan of care. Discussed with patient that she will benefit from seeing a psychiatrist for efficient medication management to treat her depression. Recommend ed that patient see a therapist regularly to cope with depression, patient is agreeable. Discharge when stabilized.  Medical Decision Making Problem Points:  Established problem, stable/improving (1), Review of last therapy session (1) and Review of psycho-social stressors (1) Data Points:  Review of medication regiment & side effects (2)  I certify that inpatient services furnished can reasonably be expected to improve the patient's condition.   Ramiz Turpin 07/18/2012, 11:12 AM

## 2012-07-18 NOTE — Progress Notes (Signed)
Recreation Therapy Notes  Date: 03.17.2014  Time: 2:30am  Location: [facility] Art Room   Group Topic/Focus: Values Clarification   Participation Level:  Active   Participation Quality:  Appropriate   Affect:  Euthymic   Cognitive:  Oriented   Additional Comments: Patient was given a worksheet with a shamrock on it. Patient was asked to identify six values that make up her core foundation and list them on the worksheet. Patient listed the following values on her worksheet: Honesty, Reliable, Trustworthy, Sincere, Compassionate, Loving. Patient stated that wellness is "gettign well." Patient stated that values have a positive effect on wellness because you "think positive and they can help you keep a positive attitude." Patient participated in group discussion about values having a positive impact on wellness and coping mechanisms.   Marykay Lex Amardeep Beckers, LRT/CTRS  Elveria Lauderbaugh L 07/18/2012 3:24 PM

## 2012-07-18 NOTE — Progress Notes (Signed)
Adult Psychoeducational Group Note  Date:  07/18/2012 Time:  11:00AM Group Topic/Focus:  Self Care:   The focus of this group is to help patients understand the importance of self-care in order to improve or restore emotional, physical, spiritual, interpersonal, and financial health.  Participation Level:  Active  Participation Quality:  Appropriate, Sharing and Supportive  Affect:  Appropriate  Cognitive:  Alert and Appropriate  Insight: Appropriate  Engagement in Group:  Engaged  Modes of Intervention:  Discussion  Additional Comments:  Pt. Was attentive and appropriate during today's group discussion. Pt. Was able share and discuss self care. Pt. Stated that have her own psychotherapy, make time away from telephones, email, and the internet. Pt. Stated that she has the most trouble with watching tv shows.   Bing Plume D 07/18/2012, 2:23 PM

## 2012-07-18 NOTE — Progress Notes (Signed)
[facility] INPATIENT:  Family/Significant Other Suicide Prevention Education  Suicide Prevention Education:  Education Completed; [place], [place], [phone] has been identified by the patient as the family member/significant other with whom the patient will be residing, and identified as the person(s) who will aid the patient in the event of a mental health crisis (suicidal ideations/suicide attempt).  With written consent from the patient, the family member/significant other has been provided the following suicide prevention education, prior to the and/or following the discharge of the patient.  The suicide prevention education provided includes the following:  Suicide risk factors  Suicide prevention and interventions  National Suicide Hotline telephone number  [facility] assessment telephone number  [facility] Emergency Assistance 911  [facility] and/or Residential Mobile Crisis Unit telephone number  Request made of family/significant other to:  Remove weapons (e.g., guns, rifles, knives), all items previously/currently identified as safety concern.  Husband advised guns have been secured.  Remove drugs/medications (over-the-counter, prescriptions, illicit drugs), all items previously/currently identified as a safety concern.  The family member/significant other verbalizes understanding of the suicide prevention education information provided.  The family member/significant other agrees to remove the items of safety concern listed above.  Wynn Banker 07/18/2012, 3:37 PM Cashius Grandstaff, [place] July 07/18/2012, 3:37 PM

## 2012-07-18 NOTE — Clinical Social Work Note (Signed)
[facility] LCSW Group Therapy          Overcoming Obstacles       1:15 -2:30        07/18/2012   2:49 PM     Type of Therapy:  Group Therapy  Participation Level:  Appropriate  Participation Quality:  Appropriate  Affect:  Appropriate  Cognitive:  Attentive Appropriate  Insight:  Engaged  Engagement in Therapy:  Engaged  Modes of Intervention:  Discussion Exploration Problem-Solving Supportive  Summary of Progress/Problems:  Patient reports the obstacle she has to overcome is staying out of bed.  She shared she is always tired and becomes an obstacle to herself by going back to bed.  She advised of starting to work three days per week and doing volunteer work as a way of focusing herself out of bed.  Patient was advised of the [employer] [facility] for additional services for herself and for family support groups.  Wynn Banker 07/18/2012    2:49 PM

## 2012-07-18 NOTE — Progress Notes (Signed)
[facility] LCSW Aftercare Discharge Planning Group Note  07/18/2012 11:07 AM  Participation Quality:  Appropriate  Affect:  Appropriate  Cognitive:  Appropriate  Insight:  Engaged  Engagement in Group:  Engaged  Modes of Intervention:  Exploration, Problem-solving, Rapport Building and Support  Summary of Progress/Problems:  Patient advised of admitting to hospital due to SI and increased depression.  She advised of a long history of depression and currently rates depression at five.  She rates all other symptoms at two.  Patient shared she has no reason to be depression and believes medications stopped working.  She has home transportation and access to medications.  Wynn Banker 07/18/2012, 11:07 AM

## 2012-07-18 NOTE — Tx Team (Signed)
Interdisciplinary Treatment Plan Update   Date Reviewed:  07/18/2012  Time Reviewed:  9:45 AM  Progress in Treatment:   Attending groups: Yes Participating in groups: Yes Taking medication as prescribed: Yes  Tolerating medication: Yes Family/Significant other contact made: No, contact to be made with husband. Patient understands diagnosis: Yes  Discussing patient identified problems/goals with staff: Yes Medical problems stabilized or resolved: Yes Denies suicidal/homicidal ideation: Yes Patient has not harmed self or others: Yes  For review of initial/current patient goals, please see plan of care.  Estimated Length of Stay:    Reasons for Continued Hospitalization:  Anxiety Depression Medication stabilization   New Problems/Goals identified:    Discharge Plan or Barriers:   Home with outpatient follow up  Additional Comments:  Patient reports a long history of depression.  She is not endorsing SI at this time.  She rates depression at five and all other symptoms at one.  Attendees:  Patient:  07/18/2012 9:45 AM   Signature: Patrick North, MD 07/18/2012 9:45 AM  Signature: 07/18/2012 9:45 AM  Signature:  07/18/2012 9:45 AM  Signature:Beverly Terrilee Croak, RN 07/18/2012 9:45 AM  Signature:  Neill Loft RN 07/18/2012 9:45 AM  Signature:  Juline Patch, LCSW 07/18/2012 9:45 AM  Signature: Silverio Decamp, PMH-NP 07/18/2012 9:45 AM  Signature:  07/18/2012 9:45 AM  Signature:  07/18/2012 9:45 AM  Signature:    Signature:    Signature:      Scribe for Treatment Team:   Juline Patch,  07/18/2012 9:45 AM

## 2012-07-18 NOTE — Progress Notes (Signed)
Patient ID: [name], female   DOB: October 05, 1945, 67 y.o.   MRN: [id number] Patient reports she slept well and that her appetite is good.  Her energy level is normal per her reports and she says she rates her depression an 8 and says this is an improvement from 10.  She started effexor yesterday and said she could feel things lightening.She denies SI/HI.  She says she plans to be more aware when depression is getting worse and seek help quicker.  She is attending groups and interacting with staff and peers.

## 2012-07-18 NOTE — Progress Notes (Signed)
Adult Psychoeducational Group Note  Date:  07/18/2012 Time:  12:24 AM  Group Topic/Focus:  Wrap-Up Group:   The focus of this group is to help patients review their daily goal of treatment and discuss progress on daily workbooks.  Participation Level:  Active  Participation Quality:  Appropriate  Affect:  Flat  Cognitive:  Appropriate  Insight: Limited  Engagement in Group:  Engaged  Modes of Intervention:  Discussion  Additional Comments:  [name] stated that she did meet her goal for today, which was not to sleep all day. Pt stated that she had a bad morning and was feeling very down, but that she started to feel better in the afternoon. Pt stated that overall, she had a good day.  [name] 07/18/2012, 12:24 AM

## 2012-07-19 DIAGNOSIS — F411 Generalized anxiety disorder: Secondary | ICD-10-CM

## 2012-07-19 MED ORDER — VENLAFAXINE HCL ER 75 MG PO CP24: 75.0000 mg | ORAL_CAPSULE | Freq: Every day | ORAL | Status: DC

## 2012-07-19 MED ORDER — THYROID 30 MG PO TABS: 150.0000 mg | ORAL_TABLET | Freq: Every day | ORAL | Status: DC

## 2012-07-19 MED ORDER — ESTRADIOL 0.5 MG PO TABS: 0.5000 mg | ORAL_TABLET | Freq: Every day | ORAL | Status: DC

## 2012-07-19 MED ORDER — OMEPRAZOLE 20 MG PO CPDR: 40.0000 mg | DELAYED_RELEASE_CAPSULE | Freq: Every day | ORAL | Status: DC

## 2012-07-19 MED ORDER — THYROID 300 MG PO TABS: 150.0000 mg | ORAL_TABLET | Freq: Every day | ORAL | Status: DC

## 2012-07-19 NOTE — Discharge Summary (Signed)
Physician Discharge Summary Note  Patient:  [name] is an 67 y.o., female MRN:  [id number] DOB:  1946/01/03 Patient phone:  [phone] (home)  Patient address:   [address] [place] [address],   Date of Admission:  07/15/2012 Date of Discharge: 07/19/2012  Reason for Admission:  Depression with suicidal thoughts  Discharge Diagnoses: Active Problems:   * No active hospital problems. *  Review of Systems  Constitutional: Negative.   HENT: Negative.   Eyes: Negative.   Respiratory: Negative.   Cardiovascular: Negative.   Gastrointestinal: Negative.   Genitourinary: Negative.   Musculoskeletal: Negative.   Skin: Negative.   Neurological: Negative.   Endo/Heme/Allergies: Negative.   Psychiatric/Behavioral: Positive for depression. The patient is nervous/anxious.    Axis Diagnosis:   AXIS I:  Anxiety Disorder NOS and Major Depression, Recurrent severe AXIS II:  Deferred AXIS III:   Past Medical History  Diagnosis Date  . Hypothyroidism   . GERD (gastroesophageal reflux disease)   . Sleep apnea    AXIS IV:  other psychosocial or environmental problems, problems related to social environment and problems with primary support group AXIS V:  61-70 mild symptoms  Level of Care:  OP  Hospital Course:  On admission:  Margretta presented to [facility] for assessment accompanied by her husband, who participated in the assessment at the pt's request. Pt was referred for assessment by her PCP, Mia Creek, MD, who called this [facility] and said that Pt was "severely and profoundly depressed" and experiencing suicidal ideation and would require inpatient psychiatric treatment. Pt reports she has experienced depressive episode all her life and that this was the worst she has ever felt. She says that she began feeling extremely depressed 2-3 weeks ago. Her symptoms include crying spells, increased sleep, staying in bed, social isolation, anhedonia and feelings of "darkness"  and hopelessness. Pt and husband report Pt is sleeping 16-18 hours daily, not just staying in bed but sleeping. She states she has poor appetite and has lost approximately 5 lbs over the past month. She reports she has always told herself she wouldn't consider suicide but now is having thoughts of overdosing on her medications. "I have pills... Something just has to happen." She reports one previous suicide attempt when she was an adolescent and when asked what precipitated that attempt she reports "nothing, I was just depressed." Pt reports she had a brother who committed suicide.  During hospitalization,  her medications were managed as follows--Cymbalta discontinued and Effexor started for her depression.  Amiel sleep and appetite have improved, energy has also improved.  She attended groups and participated.  Leean has a good support system--husband, 3 children.  Patient denied suicidal/homicidal ideations and auditory/visual hallucinations, follow-up appointments encouraged to attend, Rx given at discharge.  Mishel is mentally and physically stable for discharge.  Consults:  None  Significant Diagnostic Studies:  labs: Completed and reviewed, stable  Discharge Vitals:   Blood pressure 106/72, pulse 101, temperature 97.9 F (36.6 C), temperature source Oral, resp. rate 16, height 5\' 2"  (1.575 m), weight 82.101 kg (181 lb). Body mass index is 33.1 kg/(m^2). Lab Results:   Results for orders placed during the hospital encounter of 07/15/12 (from the past 72 hour(s))  TSH     Status: Abnormal   Collection Time    07/18/12  6:15 AM      Result Value Range   TSH <0.008 (*) 0.350 - 4.500 uIU/mL  T3, FREE     Status: Abnormal  Collection Time    07/18/12  6:15 AM      Result Value Range   T3, Free 4.7 (*) 2.3 - 4.2 pg/mL  T4, FREE     Status: None   Collection Time    07/18/12  6:15 AM      Result Value Range   Free T4 1.12  0.80 - 1.80 ng/dL    Physical Findings: AIMS: Facial and  Oral Movements Muscles of Facial Expression: None, normal Lips and Perioral Area: None, normal Jaw: None, normal Tongue: None, normal,Extremity Movements Upper (arms, wrists, hands, fingers): Minimal Lower (legs, knees, ankles, toes): Minimal, Trunk Movements Neck, shoulders, hips: None, normal, Overall Severity Severity of abnormal movements (highest score from questions above): Minimal Incapacitation due to abnormal movements: None, normal Patient's awareness of abnormal movements (rate only patient's report): Aware, no distress, Dental Status Current problems with teeth and/or dentures?: Yes Does patient usually wear dentures?: No  CIWA:    COWS:     Psychiatric Specialty Exam: See Psychiatric Specialty Exam and Suicide Risk Assessment completed by Attending Physician prior to discharge.  Discharge destination:  Home  Is patient on multiple antipsychotic therapies at discharge:  No   Has Patient had three or more failed trials of antipsychotic monotherapy by history:  No Recommended Plan for Multiple Antipsychotic Therapies:  N/A  Discharge Orders   Future Orders Complete By Expires     Activity as tolerated - No restrictions  As directed     Diet - low sodium heart healthy  As directed         Medication List    STOP taking these medications       DULoxetine 60 MG capsule  Commonly known as:  CYMBALTA      TAKE these medications     Indication   estradiol 0.5 MG tablet  Commonly known as:  ESTRACE  Take 1 tablet (0.5 mg total) by mouth daily.   Indication:  Deficiency of the Hormone Estrogen     omeprazole 20 MG capsule  Commonly known as:  PRILOSEC  Take 2 capsules (40 mg total) by mouth daily.   Indication:  Gastroesophageal Reflux Disease with Current Symptoms     thyroid 300 MG tablet  Commonly known as:  ARMOUR  Take 0.5 tablets (150 mg total) by mouth daily before breakfast.   Indication:  Underactive Thyroid     venlafaxine XR 75 MG 24 hr capsule   Commonly known as:  EFFEXOR-XR  Take 1 capsule (75 mg total) by mouth daily with breakfast.   Indication:  Major Depressive Disorder           Follow-up Information   Follow up with Victor[facility] D[facility] On 07/21/2012. (You are scheduled to see Victorino Dike at 10:45 on Thursday, July 21, 2012)    Contact information:   [address] [place], [place]   [address]  [phone]      Follow-up recommendations:  Activity:  As tolerated Diet:  Low-sodium heart healthy diet  Comments:  Patient will follow-up with [facility] for her care after discharge.  Total Discharge Time:  Greater than 30 minutes.  SignedNanine Means, PMH-NP 07/19/2012, 2:31 PM

## 2012-07-19 NOTE — Progress Notes (Signed)
Adult Psychoeducational Group Note  Date:  07/19/2012 Time:  9:41 AM  Group Topic/Focus:  Recovery Goals:   The focus of this group is to identify appropriate goals for recovery and establish a plan to achieve them.  Participation Level:  Active  Participation Quality:  Appropriate and Attentive  Affect:  Appropriate  Cognitive:  Appropriate  Insight: Appropriate  Engagement in Group:  Engaged  Modes of Intervention:  Discussion  Additional Comments:  Pt was appropriate and attentive while attending group. Pt shared that she realizes that recovery is something that she will deal with the rest of her life. She states that she plans to have things in place to deal with her depression when it becomes to much.   Sharyn Lull 07/19/2012, 9:41 AM

## 2012-07-19 NOTE — Progress Notes (Signed)
Resting quietly with eyes closed. Respirations even and unlabored. No distress noted. q 15 minute check continues as ordered to maintain safety.

## 2012-07-19 NOTE — [facility] Suicide Risk Assessment (Signed)
Suicide Risk Assessment  Discharge Assessment     Demographic Factors:  Female, caucasian, married  Mental Status Per Nursing Assessment::   On Admission:  Suicidal ideation indicated by patient;Suicidal ideation indicated by others;Plan includes specific time, place, or method;Self-harm thoughts;Intention to act on plan to harm others;Intention to act on suicide plan;Belief that plan would result in death  Current Mental Status by Physician: Patient is alert and oriented to 4. Denies AH/VH/SI/HI. Fair insight and judgement.  Loss Factors: NA  Historical Factors: Patient has history of MDD.  Risk Reduction Factors:   Sense of responsibility to family, Positive social support and Positive coping skills or problem solving skills  Continued Clinical Symptoms:  Depression:   Recent sense of peace/wellbeing  Cognitive Features That Contribute To Risk:  Cognitively intact  Suicide Risk:  Minimal: No identifiable suicidal ideation.  Patients presenting with no risk factors but with morbid ruminations; may be classified as minimal risk based on the severity of the depressive symptoms  Discharge Diagnoses:   AXIS I:  Major Depression, Recurrent severe AXIS II:  Deferred AXIS III:   Past Medical History  Diagnosis Date  . Hypothyroidism   . GERD (gastroesophageal reflux disease)   . Sleep apnea    AXIS IV:  other psychosocial or environmental problems AXIS V:  61-70 mild symptoms  Plan Of Care/Follow-up recommendations:  Activity:  As tolerated Diet:  regular Follow up with outpatient appointments.  Is patient on multiple antipsychotic therapies at discharge:  No   Has Patient had three or more failed trials of antipsychotic monotherapy by history:  No  Recommended Plan for Multiple Antipsychotic Therapies: NA  [name] 07/19/2012, 12:40 PM

## 2012-07-19 NOTE — Progress Notes (Signed)
[facility] Adult Case Management Discharge Plan :  Will you be returning to the same living situation after discharge: Yes,  Patient will return to the home she shares with her husband At discharge, do you have transportation home?:Yes,  Husband will transport patient home Do you have the ability to pay for your medications:Yes,  Patient can afford to get medications.  Release of information consent forms completed and in the chart;  Patient's signature needed at discharge.  Patient to Follow up at: Follow-up Information   Follow up with [facility] On 07/21/2012. (You are scheduled to see Victorino Dike at 10:45 on Thursday, July 21, 2012)    Contact information:   [address] [place], [place]   [address]  [phone]      Patient denies SI/HI:   Yes,  Patient is not endorsing SI/HI or thoughts of self harm.    Safety Planning and Suicide Prevention discussed:  Yes,  Reviewed during aftercare group  Edvardo Honse, [place] July 07/19/2012, 12:19 PM

## 2012-07-19 NOTE — Progress Notes (Signed)
D:  Patient discharged to home today.  Up and attending all groups this morning.  Interacting well with peers.  Denies depressive symptoms or suicidal ideation.  She states her experience here was positive and she feels ready to go home.  A:  Reviewed all discharge instructions, medications, and follow up care with patient.   R:  Patient verbalized understanding of all instructions.  Patient was escorted to the search room and then to the front lobby where her ride was waiting to pick her up.

## 2012-07-19 NOTE — Progress Notes (Signed)
[facility] LCSW Aftercare Discharge Planning Group Note  07/19/2012 12:21 PM  Participation Quality:  Appropriate  Affect:  Appropriate  Cognitive:  Appropriate  Insight:  Engaged  Engagement in Group:  Engaged  Modes of Intervention:  Exploration, Problem-solving, Rapport Building and Support  Summary of Progress/Problems:  Patient reports doing well and looks forward to discharging home today.  She denies SI/HI and thoughts of self harm.  She rates  Depression at four and anxiety at zero. Wynn Banker 07/19/2012, 12:21 PM

## 2012-07-19 NOTE — Progress Notes (Signed)
Date: 07/19/2012   Time: 10:48 AM   Group Topic/Focus:  Patients participated in therapeutic activity where they were asked to identify coping skills they already have. They were then split into teams and played a game of coping skills pictionary. They were asked to identify new coping skills they learned from the activity before the end of group.   Participation Level: Active   Participation Quality: Appropriate   Affect: Appropriate   Cognitive: Appropriate   Insight: Appropriate   Engagement in Group: Engaged   Modes of Intervention: Activity  Additional Comments: [name] said that she does not currently have very positive coping skills but likes to sleep and watch TV.

## 2012-07-21 DIAGNOSIS — F339 Major depressive disorder, recurrent, unspecified: Secondary | ICD-10-CM | POA: Diagnosis not present

## 2012-07-21 NOTE — Progress Notes (Addendum)
Patient Discharge Instructions:  After Visit Summary (AVS):   Faxed to:  07/21/12 Discharge Summary Note:   Faxed to:  07/21/12 Suicide Risk Assessment - Discharge Assessment:   Faxed to:  07/21/12 Faxed/Sent to the Next Level Care provider:  07/21/12 Faxed to [facility] @ [phone]  Jerelene Redden, 07/21/2012, 4:17 PM

## 2012-07-28 NOTE — H&P (Signed)
Psychiatric Admission Assessment Adult  Patient Identification:  [name] Date of Evaluation:  07/16/2012 Chief Complaint:  Major Depressive Disorder  History of Present Illness: [name] is a 67 year woman with a past psychiatric history significant for symptoms a depression and anxiety.  The patient presented to [facility] assessement reporting suicidal ideation with a plan to overdose on her sleeping medications.  She reports that her grandson was the main reason she had sought help and decided not to end her life. She endorses a long history of depression with symptoms which started at age 33.  The patient had a prior episode of depression from 10/2011 to 01/2012. She states her depression worsened over the past 2-3 weeks she began to have suicidal ideations.  She is unable to identify any specific cause for her depression or trigger for her current suicidal ideation.  She states she has a good support system of family and friends.  She is currently prescribed antidepressants (cymbalta 60 mg)  by her primary care doctor which she reports would work for some time then stop.  She was at her PCP office for an appointment and was referred to [facility] for an assessment.    Elements:  Location:  Inpatient service.. Quality:  Continuous. Severity:  Severe.. Timing:  Current episode for the past 2-3 weeks.. Duration:  Since the age of 67.. Context:  Daily symptoms affecting day to day life..  Associated Signs/Synptoms: Depression Symptoms:  depressed mood, anhedonia, suicidal thoughts with specific plan, hypersomnia, loss of energy/fatigue, weight loss, (Hypo) Manic Symptoms: Negative Anxiety Symptoms:  Excessive Worry, Psychotic Symptoms: Negative PTSD Symptoms: Negative  Psychiatric Specialty Exam: Physical Exam  Nursing note and vitals reviewed. Constitutional: She appears well-developed and well-nourished. No distress.  Please see physical performed by Nurse practitioner.  Skin: She is  not diaphoretic.    ROS Constitutional: Positive for malaise/fatigue. Negative for fever, chills, weight loss and diaphoresis.  HENT: Negative. Negative for neck pain.  Eyes: Negative.  Respiratory: Negative.  Cardiovascular: Negative.  Gastrointestinal: Positive for heartburn. Negative for nausea, vomiting, abdominal pain, diarrhea, constipation, blood in stool and melena.  Genitourinary: Negative.  Musculoskeletal: Positive for joint pain (Patient states that she has arthuritis in hips, hands and knees and has see a physician related to). Negative for myalgias, back pain and falls.  Skin: Negative.  Neurological: Negative. Negative for weakness.  Endo/Heme/Allergies: Negative.  Psychiatric/Behavioral: Positive for depression (Patient states that she has had depression since she was 67 yr old. Rates 10 at check in which is the worse she has ever been and today 9/10) and suicidal ideas (Denies today. The last SI thought was Thursday 07/14/12. States that she has sleeping pill that she would use.). Negative for hallucinations and substance abuse. Nervous/anxious: Rates 1-2/10. "I get really anxious when I'm really tired.    Blood pressure 123/67, pulse 104, temperature 98.5 F (36.9 C), temperature source Oral, resp. rate 20, height 5\' 2"  (1.575 m), weight 82.101 kg (181 lb).Body mass index is 33.1 kg/(m^2).    General Appearance: Casual and Fairly Groomed   Eye Contact::Fair   Speech:Clear and Coherent and Normal Rate   Volume: Normal   Mood: Anxious and Depressed   Affect: Depressed and Flat   Thought Process: Goal Directed   Orientation: Full (Time, Place, and Person)   Thought Content: WDL   Suicidal Thoughts:  Yes. with intent/plan   Homicidal Thoughts:No  Memory:  Immediate;   Good Recent;   Good Remote;   Good  Judgement:  Fair  Insight:  Fair  Psychomotor Activity:  Normal  Concentration:  Good  Recall:  Good  Akathisia:  No  Handed:  Right  AIMS (if indicated):   Not  indicated  Assets:  Desire for Improvement Housing Physical Health Social Support Transportation  Sleep:  Number of Hours: 6.75    Past Psychiatric History: Diagnosis: Major Depressive Disorder  Hospitalizations: Unknown  Outpatient Care: By primary care provider  Substance Abuse Care: Patient denies.  Self-Mutilation: Patient denies.  Suicidal Attempts: Patient reports one attempt as an adolescent by overdose  Violent Behaviors: Patient denies.    Social History:     Current Place of Residence:  [employer] of Birth:   Family Members: Live with husband. Has 3 adult sons. Marital Status:  Married Children: Yes-3 adult sons. Relationships: Patient reports she has good social support from husband, family, and friends. Education:  [facility] Problems/Performance: Religious Beliefs/Practices: Yes History of Abuse: Patient denies.  Occupational Experiences: Retired Hotel manager History:  None. Legal History: Patient denies. Hobbies/Interests:  Family History:  History reviewed. No pertinent family history.  No results found for this or any previous visit (from the past 72 hour(s)). Psychological Evaluations:  Assessment:   AXIS I:  Major Depressive Disorder, Recurrent, Severe Without Psychotic Features  AXIS II:  No diagnosis AXIS III:   Past Medical History  Diagnosis Date  . Hypothyroidism   . GERD (gastroesophageal reflux disease)   . Sleep apnea    AXIS IV:  Chronic mental health disease. AXIS V:  21-30 behavior considerably influenced by delusions or hallucinations OR serious impairment in judgment, communication OR inability to function in almost all areas  Treatment Plan/Recommendations:  As noted below  Treatment Plan Summary: Daily contact with patient to assess and evaluate symptoms and progress in treatment Medication management  Current Medications:  No current facility-administered medications for this encounter.   Current  Outpatient Prescriptions  Medication Sig Dispense Refill  . estradiol (ESTRACE) 0.5 MG tablet Take 1 tablet (0.5 mg total) by mouth daily.  30 tablet  0  . omeprazole (PRILOSEC) 20 MG capsule Take 2 capsules (40 mg total) by mouth daily.  30 capsule  0  . thyroid (ARMOUR) 300 MG tablet Take 0.5 tablets (150 mg total) by mouth daily before breakfast.  15 tablet  0  . venlafaxine XR (EFFEXOR-XR) 75 MG 24 hr capsule Take 1 capsule (75 mg total) by mouth daily with breakfast.  30 capsule  1    Observation Level/Precautions:  15 minute checks  Laboratory:  None  Psychotherapy:  Group therapy  Medications:  Start Effexor 37.5 mg QD and will titrate Cymbalta DR down starting by decreasing to 30 mg tomorrow. Continue all other treatment plans.    Consultations:  None  Discharge Concerns:  Follow up.  Estimated LOS: 3-5 day  Other:  N/A   I certify that inpatient services furnished can reasonably be expected to improve the patient's condition.    Oswald Pott, [facility]

## 2012-07-28 NOTE — [facility] Suicide Risk Assessment (Addendum)
Suicide Risk Assessment  Admission Assessment     Nursing information obtained from:  Patient Demographic factors:  Caucasian;Age 65 or older;Access to firearms Current Mental Status:  Suicidal ideation indicated by patient;Suicidal ideation indicated by others;Plan includes specific time, place, or method;Self-harm thoughts;Intention to act on plan to harm others;Intention to act on suicide plan;Belief that plan would result in death Loss Factors:  Decline in physical health Historical Factors:  Prior suicide attempts;Family history of suicide;Family history of mental illness or substance abuse Risk Reduction Factors:  Sense of responsibility to family;Positive social support  CLINICAL FACTORS:   Severe Anxiety and/or Agitation Depression:   Anhedonia  COGNITIVE FEATURES THAT CONTRIBUTE TO RISK:  Closed-mindedness    SUICIDE RISK:   Minimal: No identifiable suicidal ideation.  Patients presenting with no risk factors but with morbid ruminations; may be classified as minimal risk based on the severity of the depressive symptoms  PLAN OF CARE:  Treatment Plan Summary:  Daily contact with patient to assess and evaluate symptoms and progress in treatment  Medication management   Plan: Start Effexor 37.5 mg QD and will titrate Cymbalta DR down starting by decreasing to 30 mg tomorrow. Continue all other treatment plans.    I certify that inpatient services furnished can reasonably be expected to improve the patient's condition.  Raenah Murley, [facility]

## 2012-08-01 DIAGNOSIS — E7800 Pure hypercholesterolemia, unspecified: Secondary | ICD-10-CM | POA: Diagnosis not present

## 2012-08-25 DIAGNOSIS — M159 Polyosteoarthritis, unspecified: Secondary | ICD-10-CM | POA: Diagnosis not present

## 2012-08-25 DIAGNOSIS — G4714 Hypersomnia due to medical condition: Secondary | ICD-10-CM | POA: Diagnosis not present

## 2012-08-25 DIAGNOSIS — F329 Major depressive disorder, single episode, unspecified: Secondary | ICD-10-CM | POA: Diagnosis not present

## 2012-08-25 DIAGNOSIS — E039 Hypothyroidism, unspecified: Secondary | ICD-10-CM | POA: Diagnosis not present

## 2012-09-16 DIAGNOSIS — G4720 Circadian rhythm sleep disorder, unspecified type: Secondary | ICD-10-CM | POA: Diagnosis not present

## 2012-09-16 DIAGNOSIS — G4730 Sleep apnea, unspecified: Secondary | ICD-10-CM | POA: Diagnosis not present

## 2012-09-17 DIAGNOSIS — G4730 Sleep apnea, unspecified: Secondary | ICD-10-CM | POA: Diagnosis not present

## 2012-09-17 DIAGNOSIS — G4720 Circadian rhythm sleep disorder, unspecified type: Secondary | ICD-10-CM | POA: Diagnosis not present

## 2012-09-22 DIAGNOSIS — Z006 Encounter for examination for normal comparison and control in clinical research program: Secondary | ICD-10-CM | POA: Diagnosis not present

## 2012-09-22 DIAGNOSIS — E039 Hypothyroidism, unspecified: Secondary | ICD-10-CM | POA: Diagnosis not present

## 2012-09-22 DIAGNOSIS — G4710 Hypersomnia, unspecified: Secondary | ICD-10-CM | POA: Diagnosis not present

## 2012-09-22 DIAGNOSIS — K219 Gastro-esophageal reflux disease without esophagitis: Secondary | ICD-10-CM | POA: Diagnosis not present

## 2012-09-22 DIAGNOSIS — F329 Major depressive disorder, single episode, unspecified: Secondary | ICD-10-CM | POA: Diagnosis not present

## 2012-09-22 DIAGNOSIS — M159 Polyosteoarthritis, unspecified: Secondary | ICD-10-CM | POA: Diagnosis not present

## 2012-09-22 DIAGNOSIS — IMO0001 Reserved for inherently not codable concepts without codable children: Secondary | ICD-10-CM | POA: Diagnosis not present

## 2012-09-22 DIAGNOSIS — G4714 Hypersomnia due to medical condition: Secondary | ICD-10-CM | POA: Diagnosis not present

## 2012-10-03 DIAGNOSIS — G4720 Circadian rhythm sleep disorder, unspecified type: Secondary | ICD-10-CM | POA: Diagnosis not present

## 2012-10-03 DIAGNOSIS — G4730 Sleep apnea, unspecified: Secondary | ICD-10-CM | POA: Diagnosis not present

## 2012-10-20 DIAGNOSIS — E039 Hypothyroidism, unspecified: Secondary | ICD-10-CM | POA: Diagnosis not present

## 2012-10-25 DIAGNOSIS — E039 Hypothyroidism, unspecified: Secondary | ICD-10-CM | POA: Diagnosis not present

## 2012-10-25 DIAGNOSIS — M159 Polyosteoarthritis, unspecified: Secondary | ICD-10-CM | POA: Diagnosis not present

## 2012-10-25 DIAGNOSIS — K219 Gastro-esophageal reflux disease without esophagitis: Secondary | ICD-10-CM | POA: Diagnosis not present

## 2012-10-25 DIAGNOSIS — G4714 Hypersomnia due to medical condition: Secondary | ICD-10-CM | POA: Diagnosis not present

## 2012-10-25 DIAGNOSIS — F329 Major depressive disorder, single episode, unspecified: Secondary | ICD-10-CM | POA: Diagnosis not present

## 2012-11-08 DIAGNOSIS — F332 Major depressive disorder, recurrent severe without psychotic features: Secondary | ICD-10-CM | POA: Diagnosis not present

## 2012-11-15 DIAGNOSIS — F332 Major depressive disorder, recurrent severe without psychotic features: Secondary | ICD-10-CM | POA: Diagnosis not present

## 2012-12-05 DIAGNOSIS — F411 Generalized anxiety disorder: Secondary | ICD-10-CM | POA: Diagnosis not present

## 2012-12-05 DIAGNOSIS — F332 Major depressive disorder, recurrent severe without psychotic features: Secondary | ICD-10-CM | POA: Diagnosis not present

## 2012-12-08 DIAGNOSIS — E039 Hypothyroidism, unspecified: Secondary | ICD-10-CM | POA: Diagnosis not present

## 2012-12-08 DIAGNOSIS — F411 Generalized anxiety disorder: Secondary | ICD-10-CM | POA: Diagnosis not present

## 2012-12-08 DIAGNOSIS — F332 Major depressive disorder, recurrent severe without psychotic features: Secondary | ICD-10-CM | POA: Diagnosis not present

## 2012-12-08 DIAGNOSIS — G4733 Obstructive sleep apnea (adult) (pediatric): Secondary | ICD-10-CM | POA: Diagnosis not present

## 2012-12-19 DIAGNOSIS — F411 Generalized anxiety disorder: Secondary | ICD-10-CM | POA: Diagnosis not present

## 2012-12-19 DIAGNOSIS — F332 Major depressive disorder, recurrent severe without psychotic features: Secondary | ICD-10-CM | POA: Diagnosis not present

## 2012-12-29 DIAGNOSIS — F332 Major depressive disorder, recurrent severe without psychotic features: Secondary | ICD-10-CM | POA: Diagnosis not present

## 2013-01-25 DIAGNOSIS — Z961 Presence of intraocular lens: Secondary | ICD-10-CM | POA: Diagnosis not present

## 2013-01-25 DIAGNOSIS — H2510 Age-related nuclear cataract, unspecified eye: Secondary | ICD-10-CM | POA: Diagnosis not present

## 2013-01-26 DIAGNOSIS — F332 Major depressive disorder, recurrent severe without psychotic features: Secondary | ICD-10-CM | POA: Diagnosis not present

## 2013-02-23 DIAGNOSIS — E039 Hypothyroidism, unspecified: Secondary | ICD-10-CM | POA: Diagnosis not present

## 2013-02-23 DIAGNOSIS — F329 Major depressive disorder, single episode, unspecified: Secondary | ICD-10-CM | POA: Diagnosis not present

## 2013-02-23 DIAGNOSIS — G4714 Hypersomnia due to medical condition: Secondary | ICD-10-CM | POA: Diagnosis not present

## 2013-02-23 DIAGNOSIS — F332 Major depressive disorder, recurrent severe without psychotic features: Secondary | ICD-10-CM | POA: Diagnosis not present

## 2013-02-23 DIAGNOSIS — F411 Generalized anxiety disorder: Secondary | ICD-10-CM | POA: Diagnosis not present

## 2013-02-23 DIAGNOSIS — M159 Polyosteoarthritis, unspecified: Secondary | ICD-10-CM | POA: Diagnosis not present

## 2013-02-23 DIAGNOSIS — K219 Gastro-esophageal reflux disease without esophagitis: Secondary | ICD-10-CM | POA: Diagnosis not present

## 2013-02-23 DIAGNOSIS — G4730 Sleep apnea, unspecified: Secondary | ICD-10-CM | POA: Diagnosis not present

## 2013-03-08 DIAGNOSIS — Z23 Encounter for immunization: Secondary | ICD-10-CM | POA: Diagnosis not present

## 2013-03-21 DIAGNOSIS — F411 Generalized anxiety disorder: Secondary | ICD-10-CM | POA: Diagnosis not present

## 2013-03-21 DIAGNOSIS — G4730 Sleep apnea, unspecified: Secondary | ICD-10-CM | POA: Diagnosis not present

## 2013-03-21 DIAGNOSIS — G40309 Generalized idiopathic epilepsy and epileptic syndromes, not intractable, without status epilepticus: Secondary | ICD-10-CM | POA: Diagnosis not present

## 2013-03-21 DIAGNOSIS — R569 Unspecified convulsions: Secondary | ICD-10-CM | POA: Diagnosis not present

## 2013-03-21 DIAGNOSIS — F332 Major depressive disorder, recurrent severe without psychotic features: Secondary | ICD-10-CM | POA: Diagnosis not present

## 2013-03-21 DIAGNOSIS — R5381 Other malaise: Secondary | ICD-10-CM | POA: Diagnosis not present

## 2013-03-23 DIAGNOSIS — R569 Unspecified convulsions: Secondary | ICD-10-CM | POA: Diagnosis not present

## 2013-04-07 ENCOUNTER — Ambulatory Visit: Payer: Self-pay | Admitting: Internal Medicine

## 2013-04-07 ENCOUNTER — Ambulatory Visit: Payer: Self-pay | Admitting: Neurology

## 2013-04-07 DIAGNOSIS — Z1231 Encounter for screening mammogram for malignant neoplasm of breast: Secondary | ICD-10-CM | POA: Diagnosis not present

## 2013-04-07 DIAGNOSIS — R413 Other amnesia: Secondary | ICD-10-CM | POA: Diagnosis not present

## 2013-04-07 DIAGNOSIS — R930 Abnormal findings on diagnostic imaging of skull and head, not elsewhere classified: Secondary | ICD-10-CM | POA: Diagnosis not present

## 2013-04-20 DIAGNOSIS — F411 Generalized anxiety disorder: Secondary | ICD-10-CM | POA: Diagnosis not present

## 2013-04-20 DIAGNOSIS — G4730 Sleep apnea, unspecified: Secondary | ICD-10-CM | POA: Diagnosis not present

## 2013-04-20 DIAGNOSIS — F332 Major depressive disorder, recurrent severe without psychotic features: Secondary | ICD-10-CM | POA: Diagnosis not present

## 2013-05-03 DIAGNOSIS — R569 Unspecified convulsions: Secondary | ICD-10-CM | POA: Diagnosis not present

## 2013-05-03 DIAGNOSIS — G4710 Hypersomnia, unspecified: Secondary | ICD-10-CM | POA: Diagnosis not present

## 2013-05-10 DIAGNOSIS — G4710 Hypersomnia, unspecified: Secondary | ICD-10-CM | POA: Diagnosis not present

## 2013-05-10 DIAGNOSIS — G4730 Sleep apnea, unspecified: Secondary | ICD-10-CM | POA: Diagnosis not present

## 2013-05-19 DIAGNOSIS — F332 Major depressive disorder, recurrent severe without psychotic features: Secondary | ICD-10-CM | POA: Diagnosis not present

## 2013-05-19 DIAGNOSIS — G4700 Insomnia, unspecified: Secondary | ICD-10-CM | POA: Diagnosis not present

## 2013-05-19 DIAGNOSIS — G4730 Sleep apnea, unspecified: Secondary | ICD-10-CM | POA: Diagnosis not present

## 2013-05-19 DIAGNOSIS — F411 Generalized anxiety disorder: Secondary | ICD-10-CM | POA: Diagnosis not present

## 2013-05-25 DIAGNOSIS — G4710 Hypersomnia, unspecified: Secondary | ICD-10-CM | POA: Diagnosis not present

## 2013-05-25 DIAGNOSIS — F329 Major depressive disorder, single episode, unspecified: Secondary | ICD-10-CM | POA: Diagnosis not present

## 2013-05-25 DIAGNOSIS — E039 Hypothyroidism, unspecified: Secondary | ICD-10-CM | POA: Diagnosis not present

## 2013-05-25 DIAGNOSIS — M159 Polyosteoarthritis, unspecified: Secondary | ICD-10-CM | POA: Diagnosis not present

## 2013-05-25 DIAGNOSIS — F3289 Other specified depressive episodes: Secondary | ICD-10-CM | POA: Diagnosis not present

## 2013-05-25 DIAGNOSIS — G4714 Hypersomnia due to medical condition: Secondary | ICD-10-CM | POA: Diagnosis not present

## 2013-05-30 DIAGNOSIS — G40804 Other epilepsy, intractable, without status epilepticus: Secondary | ICD-10-CM | POA: Diagnosis not present

## 2013-05-31 DIAGNOSIS — G40804 Other epilepsy, intractable, without status epilepticus: Secondary | ICD-10-CM | POA: Diagnosis not present

## 2013-06-01 DIAGNOSIS — G40804 Other epilepsy, intractable, without status epilepticus: Secondary | ICD-10-CM | POA: Diagnosis not present

## 2013-06-14 DIAGNOSIS — G9332 Myalgic encephalomyelitis/chronic fatigue syndrome: Secondary | ICD-10-CM | POA: Diagnosis not present

## 2013-06-14 DIAGNOSIS — R5382 Chronic fatigue, unspecified: Secondary | ICD-10-CM | POA: Diagnosis not present

## 2013-06-19 DIAGNOSIS — G4730 Sleep apnea, unspecified: Secondary | ICD-10-CM | POA: Diagnosis not present

## 2013-06-19 DIAGNOSIS — F411 Generalized anxiety disorder: Secondary | ICD-10-CM | POA: Diagnosis not present

## 2013-06-19 DIAGNOSIS — F332 Major depressive disorder, recurrent severe without psychotic features: Secondary | ICD-10-CM | POA: Diagnosis not present

## 2013-06-19 DIAGNOSIS — G4700 Insomnia, unspecified: Secondary | ICD-10-CM | POA: Diagnosis not present

## 2013-06-21 DIAGNOSIS — H26499 Other secondary cataract, unspecified eye: Secondary | ICD-10-CM | POA: Diagnosis not present

## 2013-06-22 DIAGNOSIS — F3289 Other specified depressive episodes: Secondary | ICD-10-CM | POA: Diagnosis not present

## 2013-06-22 DIAGNOSIS — G4710 Hypersomnia, unspecified: Secondary | ICD-10-CM | POA: Diagnosis not present

## 2013-06-22 DIAGNOSIS — I10 Essential (primary) hypertension: Secondary | ICD-10-CM | POA: Diagnosis not present

## 2013-06-22 DIAGNOSIS — F329 Major depressive disorder, single episode, unspecified: Secondary | ICD-10-CM | POA: Diagnosis not present

## 2013-07-17 DIAGNOSIS — G4700 Insomnia, unspecified: Secondary | ICD-10-CM | POA: Diagnosis not present

## 2013-07-17 DIAGNOSIS — G4730 Sleep apnea, unspecified: Secondary | ICD-10-CM | POA: Diagnosis not present

## 2013-07-17 DIAGNOSIS — F411 Generalized anxiety disorder: Secondary | ICD-10-CM | POA: Diagnosis not present

## 2013-07-17 DIAGNOSIS — F332 Major depressive disorder, recurrent severe without psychotic features: Secondary | ICD-10-CM | POA: Diagnosis not present

## 2013-08-08 DIAGNOSIS — J209 Acute bronchitis, unspecified: Secondary | ICD-10-CM | POA: Diagnosis not present

## 2013-08-08 DIAGNOSIS — R079 Chest pain, unspecified: Secondary | ICD-10-CM | POA: Diagnosis not present

## 2013-08-17 DIAGNOSIS — G4733 Obstructive sleep apnea (adult) (pediatric): Secondary | ICD-10-CM | POA: Diagnosis not present

## 2013-08-17 DIAGNOSIS — F332 Major depressive disorder, recurrent severe without psychotic features: Secondary | ICD-10-CM | POA: Diagnosis not present

## 2013-08-17 DIAGNOSIS — F411 Generalized anxiety disorder: Secondary | ICD-10-CM | POA: Diagnosis not present

## 2013-08-17 DIAGNOSIS — G4700 Insomnia, unspecified: Secondary | ICD-10-CM | POA: Diagnosis not present

## 2013-09-14 DIAGNOSIS — G4700 Insomnia, unspecified: Secondary | ICD-10-CM | POA: Diagnosis not present

## 2013-09-14 DIAGNOSIS — F411 Generalized anxiety disorder: Secondary | ICD-10-CM | POA: Diagnosis not present

## 2013-09-14 DIAGNOSIS — F332 Major depressive disorder, recurrent severe without psychotic features: Secondary | ICD-10-CM | POA: Diagnosis not present

## 2013-09-14 DIAGNOSIS — G4730 Sleep apnea, unspecified: Secondary | ICD-10-CM | POA: Diagnosis not present

## 2013-10-12 DIAGNOSIS — F332 Major depressive disorder, recurrent severe without psychotic features: Secondary | ICD-10-CM | POA: Diagnosis not present

## 2013-10-12 DIAGNOSIS — G4700 Insomnia, unspecified: Secondary | ICD-10-CM | POA: Diagnosis not present

## 2013-10-12 DIAGNOSIS — G4733 Obstructive sleep apnea (adult) (pediatric): Secondary | ICD-10-CM | POA: Diagnosis not present

## 2013-10-12 DIAGNOSIS — F411 Generalized anxiety disorder: Secondary | ICD-10-CM | POA: Diagnosis not present

## 2013-10-26 DIAGNOSIS — F329 Major depressive disorder, single episode, unspecified: Secondary | ICD-10-CM | POA: Diagnosis not present

## 2013-10-26 LAB — URINALYSIS, COMPLETE
Bilirubin,UR: NEGATIVE
GLUCOSE, UR: NEGATIVE mg/dL (ref 0–75)
KETONE: NEGATIVE
LEUKOCYTE ESTERASE: NEGATIVE
NITRITE: NEGATIVE
PH: 6 (ref 4.5–8.0)
Protein: NEGATIVE
RBC,UR: 1 /HPF (ref 0–5)
Specific Gravity: 1.004 (ref 1.003–1.030)
WBC UR: <1 /HPF (ref 0–5)

## 2013-10-26 LAB — COMPREHENSIVE METABOLIC PANEL
ANION GAP: 5 — AB (ref 7–16)
AST: 14 U/L — AB (ref 15–37)
Albumin: 3.7 g/dL (ref 3.4–5.0)
Alkaline Phosphatase: 68 U/L
BILIRUBIN TOTAL: 0.3 mg/dL (ref 0.2–1.0)
BUN: 18 mg/dL (ref 7–18)
CREATININE: 0.96 mg/dL (ref 0.60–1.30)
Calcium, Total: 8.8 mg/dL (ref 8.5–10.1)
Chloride: 108 mmol/L — ABNORMAL HIGH (ref 98–107)
Co2: 28 mmol/L (ref 21–32)
EGFR (African American): >60
Glucose: 96 mg/dL (ref 65–99)
OSMOLALITY: 283 (ref 275–301)
POTASSIUM: 3.9 mmol/L (ref 3.5–5.1)
SGPT (ALT): 22 U/L (ref 12–78)
SODIUM: 141 mmol/L (ref 136–145)
Total Protein: 6.4 g/dL (ref 6.4–8.2)

## 2013-10-26 LAB — CBC
HCT: 40.6 % (ref 35.0–47.0)
HGB: 13.2 g/dL (ref 12.0–16.0)
MCH: 29.9 pg (ref 26.0–34.0)
MCHC: 32.5 g/dL (ref 32.0–36.0)
MCV: 92 fL (ref 80–100)
PLATELETS: 292 10*3/uL (ref 150–440)
RBC: 4.41 10*6/uL (ref 3.80–5.20)
RDW: 13.6 % (ref 11.5–14.5)
WBC: 9.4 10*3/uL (ref 3.6–11.0)

## 2013-10-26 LAB — ETHANOL
ETHANOL %: 0.003 % (ref 0.000–0.080)
ETHANOL LVL: 3 mg/dL

## 2013-10-26 LAB — DRUG SCREEN, URINE

## 2013-10-26 LAB — SALICYLATE LEVEL

## 2013-10-26 LAB — ACETAMINOPHEN LEVEL

## 2013-10-27 ENCOUNTER — Inpatient Hospital Stay: Payer: Self-pay | Admitting: Psychiatry

## 2013-10-27 DIAGNOSIS — F411 Generalized anxiety disorder: Secondary | ICD-10-CM | POA: Diagnosis not present

## 2013-10-27 DIAGNOSIS — F332 Major depressive disorder, recurrent severe without psychotic features: Secondary | ICD-10-CM | POA: Diagnosis not present

## 2013-10-27 DIAGNOSIS — E039 Hypothyroidism, unspecified: Secondary | ICD-10-CM | POA: Diagnosis present

## 2013-10-27 DIAGNOSIS — F312 Bipolar disorder, current episode manic severe with psychotic features: Secondary | ICD-10-CM | POA: Diagnosis not present

## 2013-10-27 DIAGNOSIS — F329 Major depressive disorder, single episode, unspecified: Secondary | ICD-10-CM | POA: Diagnosis not present

## 2013-10-27 DIAGNOSIS — K219 Gastro-esophageal reflux disease without esophagitis: Secondary | ICD-10-CM | POA: Diagnosis present

## 2013-10-31 DIAGNOSIS — G4700 Insomnia, unspecified: Secondary | ICD-10-CM | POA: Diagnosis not present

## 2013-10-31 DIAGNOSIS — G4733 Obstructive sleep apnea (adult) (pediatric): Secondary | ICD-10-CM | POA: Diagnosis not present

## 2013-10-31 DIAGNOSIS — F332 Major depressive disorder, recurrent severe without psychotic features: Secondary | ICD-10-CM | POA: Diagnosis not present

## 2013-10-31 DIAGNOSIS — F411 Generalized anxiety disorder: Secondary | ICD-10-CM | POA: Diagnosis not present

## 2013-11-07 DIAGNOSIS — E039 Hypothyroidism, unspecified: Secondary | ICD-10-CM | POA: Diagnosis not present

## 2013-11-07 DIAGNOSIS — F411 Generalized anxiety disorder: Secondary | ICD-10-CM | POA: Diagnosis not present

## 2013-11-07 DIAGNOSIS — F988 Other specified behavioral and emotional disorders with onset usually occurring in childhood and adolescence: Secondary | ICD-10-CM | POA: Diagnosis not present

## 2013-11-07 DIAGNOSIS — IMO0002 Reserved for concepts with insufficient information to code with codable children: Secondary | ICD-10-CM | POA: Diagnosis not present

## 2013-11-08 DIAGNOSIS — R5381 Other malaise: Secondary | ICD-10-CM | POA: Diagnosis not present

## 2013-11-08 DIAGNOSIS — G4710 Hypersomnia, unspecified: Secondary | ICD-10-CM | POA: Diagnosis not present

## 2013-11-08 DIAGNOSIS — R21 Rash and other nonspecific skin eruption: Secondary | ICD-10-CM | POA: Diagnosis not present

## 2013-11-08 DIAGNOSIS — O864 Pyrexia of unknown origin following delivery: Secondary | ICD-10-CM | POA: Diagnosis not present

## 2013-11-08 DIAGNOSIS — R509 Fever, unspecified: Secondary | ICD-10-CM | POA: Diagnosis not present

## 2013-11-10 DIAGNOSIS — F312 Bipolar disorder, current episode manic severe with psychotic features: Secondary | ICD-10-CM | POA: Diagnosis not present

## 2013-11-14 ENCOUNTER — Inpatient Hospital Stay: Payer: Self-pay | Admitting: Psychiatry

## 2013-11-14 DIAGNOSIS — F339 Major depressive disorder, recurrent, unspecified: Secondary | ICD-10-CM | POA: Diagnosis not present

## 2013-11-14 DIAGNOSIS — F332 Major depressive disorder, recurrent severe without psychotic features: Secondary | ICD-10-CM | POA: Diagnosis present

## 2013-11-14 DIAGNOSIS — Z885 Allergy status to narcotic agent status: Secondary | ICD-10-CM | POA: Diagnosis not present

## 2013-11-14 DIAGNOSIS — Z01810 Encounter for preprocedural cardiovascular examination: Secondary | ICD-10-CM | POA: Diagnosis not present

## 2013-11-14 DIAGNOSIS — K219 Gastro-esophageal reflux disease without esophagitis: Secondary | ICD-10-CM | POA: Diagnosis present

## 2013-11-14 DIAGNOSIS — E039 Hypothyroidism, unspecified: Secondary | ICD-10-CM | POA: Diagnosis present

## 2013-11-14 DIAGNOSIS — F312 Bipolar disorder, current episode manic severe with psychotic features: Secondary | ICD-10-CM | POA: Diagnosis not present

## 2013-11-14 DIAGNOSIS — Z90710 Acquired absence of both cervix and uterus: Secondary | ICD-10-CM | POA: Diagnosis not present

## 2013-11-14 DIAGNOSIS — Z87891 Personal history of nicotine dependence: Secondary | ICD-10-CM | POA: Diagnosis not present

## 2013-11-14 DIAGNOSIS — R918 Other nonspecific abnormal finding of lung field: Secondary | ICD-10-CM | POA: Diagnosis not present

## 2013-11-14 DIAGNOSIS — E669 Obesity, unspecified: Secondary | ICD-10-CM | POA: Diagnosis present

## 2013-11-14 LAB — BEHAVIORAL MEDICINE 1 PANEL
ALBUMIN: 3.5 g/dL (ref 3.4–5.0)
ALK PHOS: 79 U/L
ALT: 26 U/L (ref 12–78)
ANION GAP: 6 — AB (ref 7–16)
AST: 20 U/L (ref 15–37)
BILIRUBIN TOTAL: 0.3 mg/dL (ref 0.2–1.0)
BUN: 10 mg/dL (ref 7–18)
Basophil #: 0.1 10*3/uL (ref 0.0–0.1)
Basophil %: 0.5 %
CALCIUM: 9.1 mg/dL (ref 8.5–10.1)
CHLORIDE: 108 mmol/L — AB (ref 98–107)
CREATININE: 1.11 mg/dL (ref 0.60–1.30)
Co2: 27 mmol/L (ref 21–32)
EGFR (African American): 60 — ABNORMAL LOW
Eosinophil #: 0.1 10*3/uL (ref 0.0–0.7)
Eosinophil %: 0.5 %
GFR CALC NON AF AMER: 52 — AB
GLUCOSE: 120 mg/dL — AB (ref 65–99)
HCT: 41 % (ref 35.0–47.0)
HGB: 13.7 g/dL (ref 12.0–16.0)
LYMPHS ABS: 3.7 10*3/uL — AB (ref 1.0–3.6)
Lymphocyte %: 33.5 %
MCH: 30.7 pg (ref 26.0–34.0)
MCHC: 33.4 g/dL (ref 32.0–36.0)
MCV: 92 fL (ref 80–100)
MONO ABS: 0.5 x10 3/mm (ref 0.2–0.9)
MONOS PCT: 4.8 %
NEUTROS ABS: 6.6 10*3/uL — AB (ref 1.4–6.5)
Neutrophil %: 60.7 %
OSMOLALITY: 281 (ref 275–301)
PLATELETS: 302 10*3/uL (ref 150–440)
POTASSIUM: 4.3 mmol/L (ref 3.5–5.1)
RBC: 4.47 10*6/uL (ref 3.80–5.20)
RDW: 13 % (ref 11.5–14.5)
SODIUM: 141 mmol/L (ref 136–145)
THYROID STIMULATING HORM: 0.014 u[IU]/mL — AB
TOTAL PROTEIN: 7.2 g/dL (ref 6.4–8.2)
WBC: 10.9 10*3/uL (ref 3.6–11.0)

## 2013-11-14 LAB — URINALYSIS, COMPLETE
Glucose,UR: NEGATIVE mg/dL (ref 0–75)
KETONE: NEGATIVE
LEUKOCYTE ESTERASE: NEGATIVE
Nitrite: NEGATIVE
PH: 5 (ref 4.5–8.0)
PROTEIN: NEGATIVE
RBC,UR: 1 /HPF (ref 0–5)
Specific Gravity: 1.013 (ref 1.003–1.030)
Squamous Epithelial: 1

## 2013-11-16 ENCOUNTER — Ambulatory Visit: Payer: Self-pay | Admitting: Psychiatry

## 2013-11-16 DIAGNOSIS — K219 Gastro-esophageal reflux disease without esophagitis: Secondary | ICD-10-CM | POA: Diagnosis not present

## 2013-11-16 DIAGNOSIS — F339 Major depressive disorder, recurrent, unspecified: Secondary | ICD-10-CM | POA: Diagnosis not present

## 2013-11-17 DIAGNOSIS — F332 Major depressive disorder, recurrent severe without psychotic features: Secondary | ICD-10-CM | POA: Diagnosis not present

## 2013-11-17 DIAGNOSIS — F339 Major depressive disorder, recurrent, unspecified: Secondary | ICD-10-CM | POA: Diagnosis not present

## 2013-11-17 DIAGNOSIS — K219 Gastro-esophageal reflux disease without esophagitis: Secondary | ICD-10-CM | POA: Diagnosis not present

## 2013-11-20 DIAGNOSIS — F332 Major depressive disorder, recurrent severe without psychotic features: Secondary | ICD-10-CM | POA: Diagnosis not present

## 2013-11-20 DIAGNOSIS — K219 Gastro-esophageal reflux disease without esophagitis: Secondary | ICD-10-CM | POA: Diagnosis not present

## 2013-11-20 DIAGNOSIS — F339 Major depressive disorder, recurrent, unspecified: Secondary | ICD-10-CM | POA: Diagnosis not present

## 2013-11-21 DIAGNOSIS — F332 Major depressive disorder, recurrent severe without psychotic features: Secondary | ICD-10-CM | POA: Diagnosis not present

## 2013-11-21 DIAGNOSIS — F411 Generalized anxiety disorder: Secondary | ICD-10-CM | POA: Diagnosis not present

## 2013-11-21 DIAGNOSIS — G4700 Insomnia, unspecified: Secondary | ICD-10-CM | POA: Diagnosis not present

## 2013-11-21 DIAGNOSIS — G4730 Sleep apnea, unspecified: Secondary | ICD-10-CM | POA: Diagnosis not present

## 2013-11-22 DIAGNOSIS — F332 Major depressive disorder, recurrent severe without psychotic features: Secondary | ICD-10-CM | POA: Diagnosis not present

## 2013-11-22 DIAGNOSIS — F339 Major depressive disorder, recurrent, unspecified: Secondary | ICD-10-CM | POA: Diagnosis not present

## 2013-11-22 DIAGNOSIS — K219 Gastro-esophageal reflux disease without esophagitis: Secondary | ICD-10-CM | POA: Diagnosis not present

## 2013-11-24 DIAGNOSIS — F339 Major depressive disorder, recurrent, unspecified: Secondary | ICD-10-CM | POA: Diagnosis not present

## 2013-11-24 DIAGNOSIS — F332 Major depressive disorder, recurrent severe without psychotic features: Secondary | ICD-10-CM | POA: Diagnosis not present

## 2013-11-24 DIAGNOSIS — K219 Gastro-esophageal reflux disease without esophagitis: Secondary | ICD-10-CM | POA: Diagnosis not present

## 2013-11-27 DIAGNOSIS — F339 Major depressive disorder, recurrent, unspecified: Secondary | ICD-10-CM | POA: Diagnosis not present

## 2013-11-27 DIAGNOSIS — F332 Major depressive disorder, recurrent severe without psychotic features: Secondary | ICD-10-CM | POA: Diagnosis not present

## 2013-11-27 DIAGNOSIS — K219 Gastro-esophageal reflux disease without esophagitis: Secondary | ICD-10-CM | POA: Diagnosis not present

## 2013-12-02 ENCOUNTER — Ambulatory Visit: Payer: Self-pay | Admitting: Psychiatry

## 2013-12-02 DIAGNOSIS — F339 Major depressive disorder, recurrent, unspecified: Secondary | ICD-10-CM | POA: Diagnosis not present

## 2013-12-04 DIAGNOSIS — F332 Major depressive disorder, recurrent severe without psychotic features: Secondary | ICD-10-CM | POA: Diagnosis not present

## 2013-12-04 DIAGNOSIS — F339 Major depressive disorder, recurrent, unspecified: Secondary | ICD-10-CM | POA: Diagnosis not present

## 2013-12-21 DIAGNOSIS — G4733 Obstructive sleep apnea (adult) (pediatric): Secondary | ICD-10-CM | POA: Diagnosis not present

## 2013-12-21 DIAGNOSIS — G4700 Insomnia, unspecified: Secondary | ICD-10-CM | POA: Diagnosis not present

## 2013-12-21 DIAGNOSIS — F411 Generalized anxiety disorder: Secondary | ICD-10-CM | POA: Diagnosis not present

## 2013-12-21 DIAGNOSIS — F332 Major depressive disorder, recurrent severe without psychotic features: Secondary | ICD-10-CM | POA: Diagnosis not present

## 2014-01-01 ENCOUNTER — Ambulatory Visit: Payer: Medicare Other | Admitting: Podiatry

## 2014-01-02 ENCOUNTER — Ambulatory Visit: Payer: Self-pay | Admitting: Psychiatry

## 2014-01-02 ENCOUNTER — Encounter: Payer: Self-pay | Admitting: Podiatry

## 2014-01-02 ENCOUNTER — Ambulatory Visit (INDEPENDENT_AMBULATORY_CARE_PROVIDER_SITE_OTHER): Payer: Medicare Other

## 2014-01-02 ENCOUNTER — Ambulatory Visit: Payer: Medicare Other | Admitting: Podiatry

## 2014-01-02 VITALS — BP 144/78 | HR 84 | Resp 16 | Ht 62.0 in | Wt 170.0 lb

## 2014-01-02 DIAGNOSIS — L02619 Cutaneous abscess of unspecified foot: Secondary | ICD-10-CM

## 2014-01-02 DIAGNOSIS — M6740 Ganglion, unspecified site: Secondary | ICD-10-CM | POA: Diagnosis not present

## 2014-01-02 DIAGNOSIS — L03039 Cellulitis of unspecified toe: Principal | ICD-10-CM

## 2014-01-02 DIAGNOSIS — M79609 Pain in unspecified limb: Secondary | ICD-10-CM

## 2014-01-02 DIAGNOSIS — Z23 Encounter for immunization: Secondary | ICD-10-CM | POA: Diagnosis not present

## 2014-01-02 DIAGNOSIS — M109 Gout, unspecified: Secondary | ICD-10-CM

## 2014-01-02 MED ORDER — CEPHALEXIN 500 MG PO CAPS: 500.0000 mg | ORAL_CAPSULE | Freq: Four times a day (QID) | ORAL | Status: DC

## 2014-01-02 NOTE — Progress Notes (Signed)
   Subjective:    Patient ID: [name], female    DOB: 08-21-1945, 68 y.o.   MRN: [id number]  HPI Comments: The fourth toe on the right foot is infected , red and swollen. It has been forming a blister since last spring it would get better than reoccur, since last week it has got real red and drainage coming out of it . i have done a round of antibiotics in the last 6 months but it was not infected at that time   Toe Pain       Review of Systems  All other systems reviewed and are negative.      Objective:   Physical Exam        Assessment & Plan:

## 2014-01-02 NOTE — Progress Notes (Signed)
Subjective:     Patient ID: [name], female   DOB: [birth year], 68 y.o.   MRN: [id number]  Toe Pain    patient presents stating that her fourth toe right has turned red and the last few days and she has had some kind of blister on it since the spring that seems to get better and then reoccur. She has done 8 round of antibiotics for Lyme's disease several months ago but that is not part of the issue she is dealing with now   Review of Systems  All other systems reviewed and are negative.      Objective:   Physical Exam  Nursing note and vitals reviewed. Constitutional: She is oriented to person, place, and time.  Cardiovascular: Intact distal pulses.   Musculoskeletal: Normal range of motion.  Neurological: She is oriented to person, place, and time.  Skin: Skin is warm.   neurovascular status intact with muscle strength adequate and range of motion subtalar and midtarsal joint within normal limits. Patient is found to have normal digital perfusion and arch height and is well oriented x3. I noted fourth toe right is inflamed and red distal phalanx to the end of the toe and there is a small bump on the inside of the fourth toe right that is localized and crusted with no current drainage noted even though she stated that she played with it and a small amount of drainage came out that she cleaned up and it was yellow and thick     Assessment:     Probable localized infection secondary to trauma to the fourth digit right with possibility for a mucoid or ganglionic cyst formation and also cannot rule out inflammatory condition due to the enlargement of the joint surface    Plan:     H&P and x-rays reviewed. At this time I started her on cephalexin 500 mg 3 times a day and I am ordering blood work to rule out arthritic . gout or infection process. I gave strict instruction if she should develop any systemic signs of infection or proximal erythema edema or drainage she is to go to the  emergency room and may require IV antibiotics. Reappoint in 1 week

## 2014-01-03 LAB — CBC WITH DIFFERENTIAL/PLATELET
BASOS ABS: 0 10*3/uL (ref 0.0–0.2)
BASOS: 0 %
EOS: 0 %
Eosinophils Absolute: 0 10*3/uL (ref 0.0–0.4)
HCT: 39.6 % (ref 34.0–46.6)
HEMOGLOBIN: 13.7 g/dL (ref 11.1–15.9)
Immature Grans (Abs): 0 10*3/uL (ref 0.0–0.1)
Immature Granulocytes: 0 %
LYMPHS: 34 %
Lymphocytes Absolute: 3.5 10*3/uL — ABNORMAL HIGH (ref 0.7–3.1)
MCH: 30.9 pg (ref 26.6–33.0)
MCHC: 34.6 g/dL (ref 31.5–35.7)
MCV: 89 fL (ref 79–97)
MONOCYTES: 6 %
Monocytes Absolute: 0.6 10*3/uL (ref 0.1–0.9)
NEUTROS ABS: 6.2 10*3/uL (ref 1.4–7.0)
Neutrophils Relative %: 60 %
RBC: 4.44 x10E6/uL (ref 3.77–5.28)
RDW: 13.2 % (ref 12.3–15.4)
WBC: 10.4 10*3/uL (ref 3.4–10.8)

## 2014-01-03 LAB — ANA: ANA: POSITIVE — AB

## 2014-01-03 LAB — URIC ACID: URIC ACID: 4.2 mg/dL (ref 2.5–7.1)

## 2014-01-03 LAB — C-REACTIVE PROTEIN: CRP: 3.1 mg/L (ref 0.0–4.9)

## 2014-01-03 LAB — SEDIMENTATION RATE: Sed Rate: 2 mm/hr (ref 0–40)

## 2014-01-03 LAB — RHEUMATOID FACTOR: Rhuematoid fact SerPl-aCnc: 7.7 IU/mL (ref 0.0–13.9)

## 2014-01-04 DIAGNOSIS — G4700 Insomnia, unspecified: Secondary | ICD-10-CM | POA: Diagnosis not present

## 2014-01-04 DIAGNOSIS — F411 Generalized anxiety disorder: Secondary | ICD-10-CM | POA: Diagnosis not present

## 2014-01-04 DIAGNOSIS — F332 Major depressive disorder, recurrent severe without psychotic features: Secondary | ICD-10-CM | POA: Diagnosis not present

## 2014-01-04 DIAGNOSIS — G4730 Sleep apnea, unspecified: Secondary | ICD-10-CM | POA: Diagnosis not present

## 2014-01-16 ENCOUNTER — Ambulatory Visit (INDEPENDENT_AMBULATORY_CARE_PROVIDER_SITE_OTHER): Payer: Medicare Other | Admitting: Podiatry

## 2014-01-16 VITALS — BP 153/79 | HR 78 | Resp 16

## 2014-01-16 DIAGNOSIS — M6740 Ganglion, unspecified site: Secondary | ICD-10-CM | POA: Diagnosis not present

## 2014-01-16 DIAGNOSIS — L02619 Cutaneous abscess of unspecified foot: Secondary | ICD-10-CM | POA: Diagnosis not present

## 2014-01-16 DIAGNOSIS — L03039 Cellulitis of unspecified toe: Secondary | ICD-10-CM | POA: Diagnosis not present

## 2014-01-16 NOTE — Progress Notes (Signed)
Subjective:     Patient ID: [name], female   DOB: 12/29/1945, 68 y.o.   MRN: [id number]  HPI patient states my toe is doing quite a bit better but I bed it will open up again someday   Review of Systems     Objective:   Physical Exam Neurovascular status intact with significant reduction of redness and swelling around the interphalangeal joint fourth toe right with no indications of current cyst formation    Assessment:     Probable mucoid cyst right which allowed for probable bacterial infiltration to the right fourth toe and infection    Plan:     Finish antibiotics continue soaking for several more days and the minute the fourth toe were to open up or a cyst were to appear I want to see her back again

## 2014-01-17 DIAGNOSIS — F411 Generalized anxiety disorder: Secondary | ICD-10-CM | POA: Diagnosis not present

## 2014-01-17 DIAGNOSIS — F332 Major depressive disorder, recurrent severe without psychotic features: Secondary | ICD-10-CM | POA: Diagnosis not present

## 2014-01-17 DIAGNOSIS — G4700 Insomnia, unspecified: Secondary | ICD-10-CM | POA: Diagnosis not present

## 2014-01-17 DIAGNOSIS — G4733 Obstructive sleep apnea (adult) (pediatric): Secondary | ICD-10-CM | POA: Diagnosis not present

## 2014-01-29 DIAGNOSIS — F411 Generalized anxiety disorder: Secondary | ICD-10-CM | POA: Diagnosis not present

## 2014-01-29 DIAGNOSIS — F341 Dysthymic disorder: Secondary | ICD-10-CM | POA: Diagnosis not present

## 2014-01-29 DIAGNOSIS — G4733 Obstructive sleep apnea (adult) (pediatric): Secondary | ICD-10-CM | POA: Diagnosis not present

## 2014-01-29 DIAGNOSIS — G4700 Insomnia, unspecified: Secondary | ICD-10-CM | POA: Diagnosis not present

## 2014-01-30 ENCOUNTER — Encounter (HOSPITAL_COMMUNITY): Payer: Self-pay | Admitting: *Deleted

## 2014-01-30 ENCOUNTER — Institutional Professional Consult (permissible substitution) (HOSPITAL_COMMUNITY): Payer: Medicare Other

## 2014-01-30 NOTE — Progress Notes (Signed)
Patient ID: [name], female   DOB: 1946/04/09, 68 y.o.   MRN: [id number] Pt reported to [facility] in [place] to gather information on Transcranial Magnetic Stimulation for Major Depressive Disorder. Writer provided pt with informational brochures and administered a PHQ-9 -- pt scored a 19 (moderately severe depression). Writer escorted pt to tx area, answered all questions pt had re: [facility]. Pt is interested in [place] as a tx option once she meets criteria. Currently does not meet criterie due to not participating in psychotherapy for adequate duration, per Medicare policy.

## 2014-02-05 DIAGNOSIS — F341 Dysthymic disorder: Secondary | ICD-10-CM | POA: Diagnosis not present

## 2014-02-12 DIAGNOSIS — G4700 Insomnia, unspecified: Secondary | ICD-10-CM | POA: Diagnosis not present

## 2014-02-12 DIAGNOSIS — G4733 Obstructive sleep apnea (adult) (pediatric): Secondary | ICD-10-CM | POA: Diagnosis not present

## 2014-02-12 DIAGNOSIS — F411 Generalized anxiety disorder: Secondary | ICD-10-CM | POA: Diagnosis not present

## 2014-02-12 DIAGNOSIS — F332 Major depressive disorder, recurrent severe without psychotic features: Secondary | ICD-10-CM | POA: Diagnosis not present

## 2014-02-19 DIAGNOSIS — F341 Dysthymic disorder: Secondary | ICD-10-CM | POA: Diagnosis not present

## 2014-03-05 DIAGNOSIS — E039 Hypothyroidism, unspecified: Secondary | ICD-10-CM | POA: Diagnosis not present

## 2014-03-05 DIAGNOSIS — F341 Dysthymic disorder: Secondary | ICD-10-CM | POA: Diagnosis not present

## 2014-03-06 DIAGNOSIS — F341 Dysthymic disorder: Secondary | ICD-10-CM | POA: Diagnosis not present

## 2014-03-08 DIAGNOSIS — F341 Dysthymic disorder: Secondary | ICD-10-CM | POA: Diagnosis not present

## 2014-03-19 DIAGNOSIS — F332 Major depressive disorder, recurrent severe without psychotic features: Secondary | ICD-10-CM | POA: Diagnosis not present

## 2014-03-27 DIAGNOSIS — E039 Hypothyroidism, unspecified: Secondary | ICD-10-CM | POA: Diagnosis not present

## 2014-03-27 DIAGNOSIS — G4730 Sleep apnea, unspecified: Secondary | ICD-10-CM | POA: Diagnosis not present

## 2014-03-27 DIAGNOSIS — E2839 Other primary ovarian failure: Secondary | ICD-10-CM | POA: Diagnosis not present

## 2014-03-28 DIAGNOSIS — F332 Major depressive disorder, recurrent severe without psychotic features: Secondary | ICD-10-CM | POA: Diagnosis not present

## 2014-04-02 DIAGNOSIS — F332 Major depressive disorder, recurrent severe without psychotic features: Secondary | ICD-10-CM | POA: Diagnosis not present

## 2014-04-02 DIAGNOSIS — G4700 Insomnia, unspecified: Secondary | ICD-10-CM | POA: Diagnosis not present

## 2014-04-04 DIAGNOSIS — H43813 Vitreous degeneration, bilateral: Secondary | ICD-10-CM | POA: Diagnosis not present

## 2014-04-05 DIAGNOSIS — F332 Major depressive disorder, recurrent severe without psychotic features: Secondary | ICD-10-CM | POA: Diagnosis not present

## 2014-04-10 ENCOUNTER — Other Ambulatory Visit (HOSPITAL_COMMUNITY): Payer: Medicare Other | Attending: Psychiatry | Admitting: Psychiatry

## 2014-04-10 DIAGNOSIS — F332 Major depressive disorder, recurrent severe without psychotic features: Secondary | ICD-10-CM | POA: Diagnosis not present

## 2014-04-10 DIAGNOSIS — R45851 Suicidal ideations: Secondary | ICD-10-CM

## 2014-04-10 NOTE — Progress Notes (Deleted)
Psychiatric Assessment Adult  Patient Identification:  [name] Date of Evaluation:  04/10/2014 Chief Complaint: chronic severe depression History of Chief Complaint:  [name] says she has been depressed since she was 68 years old.  Has been better or worse but never not depressed.  The best she felt was when she was on a female hormone briefly for another condition and Efferxor was helpful for years but then stopped working.  This episode has lasted 3 years and no medication has helped much if at all.  She was last hospitalized 4 months ago and started therapy with [facility] in the last month.  Wellbutrin was added 2 weeks ago to the Brintellix and seems to be helping some.  She has tried lithium, Wellbutrin, Brintellix, aripiprazole, Effexor, Viibryd, Lexapro, Fetzima among other meds over the years.  She has hypothyroid but that is being treated. Current symptoms will be listed below.  HPI Review of Systems Physical Exam  Depressive Symptoms: depressed mood, anhedonia, hypersomnia, fatigue, feelings of worthlessness/guilt, difficulty concentrating, hopelessness, impaired memory, recurrent thoughts of death, suicidal thoughts without plan, weight gain, increased appetite,  (Hypo) Manic Symptoms:   Elevated Mood:  Negative Irritable Mood:  Yes Grandiosity:  Negative Distractibility:  Negative Labiality of Mood:  Negative Delusions:  Negative Hallucinations:  Negative Impulsivity:  Negative Sexually Inappropriate Behavior:  Negative Financial Extravagance:  Negative Flight of Ideas:  Negative  Anxiety Symptoms: Excessive Worry:  Negative Panic Symptoms:  Negative Agoraphobia:  Negative Obsessive Compulsive: Negative  Symptoms: None, Specific Phobias:  Negative Social Anxiety:  Negative  Psychotic Symptoms:  Hallucinations: Negative None Delusions:  Negative Paranoia:  Negative   Ideas of Reference:  Negative  PTSD Symptoms: Ever had a traumatic exposure:   Negative Had a traumatic exposure in the last month:  Negative Re-experiencing: Negative None Hypervigilance:  Negative Hyperarousal: Negative None Avoidance: Negative None  Traumatic Brain Injury: Negative  Past Psychiatric History: Diagnosis: major depression, recurrent, severe without psychosis  Hospitalizations: several, 2 in the last 2 years  Outpatient Care: has not responded to therapy in the past, currently getting CBT   Substance Abuse Care: none  Self-Mutilation: none  Suicidal Attempts: none  Violent Behaviors: none   Past Medical History:   Past Medical History  Diagnosis Date  . Hypothyroidism   . GERD (gastroesophageal reflux disease)   . Sleep apnea    History of Loss of Consciousness:  Negative Seizure History:  Negative Cardiac History:  Negative Allergies:   Allergies  Allergen Reactions  . Codeine     Nausea   Current Medications:  Current Outpatient Prescriptions  Medication Sig Dispense Refill  . cephALEXin (KEFLEX) 500 MG capsule Take 1 capsule (500 mg total) by mouth 4 (four) times daily. 30 capsule 1  . estradiol (ESTRACE) 0.5 MG tablet Take 1 tablet (0.5 mg total) by mouth daily. 30 tablet 0  . levothyroxine (SYNTHROID, LEVOTHROID) 75 MCG tablet     . LORazepam (ATIVAN) 1 MG tablet     . omeprazole (PRILOSEC) 20 MG capsule Take 2 capsules (40 mg total) by mouth daily. 30 capsule 0  . thyroid (ARMOUR) 30 MG tablet Take 30 mg by mouth daily before breakfast.    . Vortioxetine HBr (BRINTELLIX) 10 MG TABS Take by mouth.    . zolpidem (AMBIEN) 5 MG tablet      No current facility-administered medications for this visit.    Previous Psychotropic Medications:  Medication Dose   Brintellix  40 mg daily  Wellbutrin XL  150 mg daily                  Substance Abuse History in the last 12 months:none Substance Age of 1st Use Last Use Amount Specific Type                                                                                              Medical Consequences of Substance Abuse: none  Legal Consequences of Substance Abuse: none  Family Consequences of Substance Abuse: none   Blackouts:  Negative DT's:  Negative Withdrawal Symptoms:  Negative None  Social History: Current Place of Residence: [place] Place of Birth: [place] Family Members: 2 sons Marital Status:  Married Children: 2 sons  Sons: 2  Daughters: 0 Relationships: good Education:  Dentist Problems/Performance: good Religious Beliefs/Practices: Christian History of Abuse: none Ship broker History:  None. Legal History: none Hobbies/Interests: not asked  Family History:  No family history on file.  Mental Status Examination/Evaluation: Objective:  Appearance: Well Groomed  Eye Contact::  Good  Speech:  Clear and Coherent  Volume:  Normal  Mood:  depressed  Affect:  Congruent  Thought Process:  Coherent and Logical  Orientation:  Full (Time, Place, and Person)  Thought Content:  Negative  Suicidal Thoughts:  Yes.  without intent/plan  Homicidal Thoughts:  No  Judgement:  Good  Insight:  Good  Psychomotor Activity:  Normal  Akathisia:  Negative  Handed:  Right  AIMS (if indicated):  0  Assets:  Communication Skills Desire for Improvement Financial Resources/Insurance Housing Intimacy Leisure Time Physical Health Resilience Social Support Talents/Skills Transportation Vocational/Educational    Laboratory/X-Ray Psychological Evaluation(s)   none  none   Assessment:  Major depression recurrent severe without psychosis                  Treatment Plan/Recommendations:  Plan of Care: TMS recommended  Laboratory:  none  Psychotherapy: sees therapist currently  Medications: continue current meds  Routine PRN Medications:  Negative  Consultations: none  Safety Concerns:  none  Other:  none    [name] D, MD 12/8/20152:00 PM

## 2014-04-11 ENCOUNTER — Encounter (HOSPITAL_COMMUNITY): Payer: Self-pay | Admitting: Psychiatry

## 2014-04-11 DIAGNOSIS — F332 Major depressive disorder, recurrent severe without psychotic features: Secondary | ICD-10-CM | POA: Diagnosis not present

## 2014-04-11 NOTE — Progress Notes (Signed)
Psychiatric Assessment Adult  Patient Identification:  [name] Date of Evaluation:  04/11/2014 Chief Complaint: Chronic Depression not helped by medication or therapy. History of Chief Complaint:  No chief complaint on file.   HPI Review of Systems Physical Exam  Patient Identification: [name] Date of Evaluation: 04/10/2014 Chief Complaint: chronic severe depression History of Chief Complaint: [name] says she has been depressed since she was 68 years old. Has been better or worse but never not depressed. The best she felt was when she was on a female hormone briefly for another condition and Efferxor was helpful for years but then stopped working. This episode has lasted 3 years and no medication has helped much if at all. She was last hospitalized 4 months ago and started therapy with [facility] in the last month. Wellbutrin was added 2 weeks ago to the Brintellix and seems to be helping some. She has tried lithium, Wellbutrin, Brintellix, aripiprazole, Effexor, Viibryd, Lexapro, Fetzima among other meds over the years. She has hypothyroid but that is being treated. Current symptoms will be listed below.  HPI Review of Systems Physical Exam  Depressive Symptoms: depressed mood, anhedonia, hypersomnia, fatigue, feelings of worthlessness/guilt, difficulty concentrating, hopelessness, impaired memory, recurrent thoughts of death, suicidal thoughts without plan, weight gain, increased appetite,  (Hypo) Manic Symptoms:  Elevated Mood: Negative Irritable Mood: Yes Grandiosity: Negative Distractibility: Negative Labiality of Mood: Negative Delusions: Negative Hallucinations: Negative Impulsivity: Negative Sexually Inappropriate Behavior: Negative Financial Extravagance: Negative Flight of Ideas: Negative  Anxiety Symptoms: Excessive Worry: Negative Panic Symptoms: Negative Agoraphobia: Negative Obsessive Compulsive:  Negative Symptoms: None, Specific Phobias: Negative Social Anxiety: Negative  Psychotic Symptoms:  Hallucinations: Negative None Delusions: Negative Paranoia: Negative  Ideas of Reference: Negative  PTSD Symptoms: Ever had a traumatic exposure: Negative Had a traumatic exposure in the last month: Negative Re-experiencing: Negative None Hypervigilance: Negative Hyperarousal: Negative None Avoidance: Negative None  Traumatic Brain Injury: Negative  Past Psychiatric History: Diagnosis: major depression, recurrent, severe without psychosis  Hospitalizations: several, 2 in the last 2 years  Outpatient Care: has not responded to therapy in the past, currently getting CBT   Substance Abuse Care: none  Self-Mutilation: none  Suicidal Attempts: none  Violent Behaviors: none   Past Medical History:  Past Medical History  Diagnosis Date  . Hypothyroidism   . GERD (gastroesophageal reflux disease)   . Sleep apnea    History of Loss of Consciousness: Negative Seizure History: Negative Cardiac History: Negative Allergies:  Allergies  Allergen Reactions  . Codeine     Nausea   Current Medications:  Current Outpatient Prescriptions  Medication Sig Dispense Refill  . cephALEXin (KEFLEX) 500 MG capsule Take 1 capsule (500 mg total) by mouth 4 (four) times daily. 30 capsule 1  . estradiol (ESTRACE) 0.5 MG tablet Take 1 tablet (0.5 mg total) by mouth daily. 30 tablet 0  . levothyroxine (SYNTHROID, LEVOTHROID) 75 MCG tablet     . LORazepam (ATIVAN) 1 MG tablet     . omeprazole (PRILOSEC) 20 MG capsule Take 2 capsules (40 mg total) by mouth daily. 30 capsule 0  . thyroid (ARMOUR) 30 MG tablet Take 30 mg by mouth daily before breakfast.    . Vortioxetine HBr (BRINTELLIX) 10 MG TABS Take by mouth.    . zolpidem (AMBIEN) 5 MG tablet      No current facility-administered medications  for this visit.    Previous Psychotropic Medications:  Medication Dose  Brintellix 40 mg daily  Wellbutrin XL  150  mg daily                  Substance Abuse History in the last 12 months:none Substance Age of 1st Use Last Use Amount Specific Type                                                                                             Medical Consequences of Substance Abuse: none  Legal Consequences of Substance Abuse: none  Family Consequences of Substance Abuse: none   Blackouts: Negative DT's: Negative Withdrawal Symptoms: Negative None  Social History: Current Place of Residence: [place] Place of Birth: [place] Family Members: 2 sons Marital Status: Married Children: 2 sons Sons: 2 Daughters: 0 Relationships: good Education: Dentist Problems/Performance: good Religious Beliefs/Practices: Christian History of Abuse: none Ship broker History: None. Legal History: none Hobbies/Interests: not asked  Family History: No family history on file.  Mental Status Examination/Evaluation: Objective: Appearance: Well Groomed  Eye Contact:: Good  Speech: Clear and Coherent  Volume: Normal  Mood: depressed  Affect: Congruent  Thought Process: Coherent and Logical  Orientation: Full (Time, Place, and Person)  Thought Content: Negative  Suicidal Thoughts: Yes. without intent/plan  Homicidal Thoughts: No  Judgement: Good  Insight: Good  Psychomotor Activity: Normal  Akathisia: Negative  Handed: Right  AIMS (if indicated): 0  Assets: Communication Skills Desire for Improvement Financial Resources/Insurance Housing Intimacy Leisure Time Physical Health Resilience Social Support Talents/Skills Transportation Vocational/Educational     Laboratory/X-Ray Psychological Evaluation(s)  none none   Assessment: Major depression recurrent severe without psychosis                  Treatment Plan/Recommendations:  Plan of Care: TMS recommended  Laboratory: none  Psychotherapy: sees therapist currently  Medications: continue current meds  Routine PRN Medications: Negative  Consultations: none  Safety Concerns: none  Other: none    Cristan Scherzer D, MD 12/8/20152:00 PM

## 2014-04-17 DIAGNOSIS — J069 Acute upper respiratory infection, unspecified: Secondary | ICD-10-CM | POA: Diagnosis not present

## 2014-04-17 DIAGNOSIS — J029 Acute pharyngitis, unspecified: Secondary | ICD-10-CM | POA: Diagnosis not present

## 2014-04-20 DIAGNOSIS — F332 Major depressive disorder, recurrent severe without psychotic features: Secondary | ICD-10-CM | POA: Diagnosis not present

## 2014-04-20 DIAGNOSIS — G4700 Insomnia, unspecified: Secondary | ICD-10-CM | POA: Diagnosis not present

## 2014-04-23 ENCOUNTER — Telehealth (HOSPITAL_COMMUNITY): Payer: Self-pay | Admitting: *Deleted

## 2014-04-23 NOTE — Telephone Encounter (Signed)
Called pt to follow up re: her decision as to whether or not to pursue Transcranial Magnetic Stimulation for Major Depressive Disorder. Pt had an appointment with her psychiatrist last week, and it was decided to defer [place] for the time being, as she is feeling better on her current medication regimen. Staff advised pt to contact this clinic if her depression worsens and she decides she wants [place]. Pt said she would.

## 2014-05-09 DIAGNOSIS — G4733 Obstructive sleep apnea (adult) (pediatric): Secondary | ICD-10-CM | POA: Diagnosis not present

## 2014-05-16 DIAGNOSIS — F332 Major depressive disorder, recurrent severe without psychotic features: Secondary | ICD-10-CM | POA: Diagnosis not present

## 2014-05-30 ENCOUNTER — Ambulatory Visit (HOSPITAL_COMMUNITY): Payer: Medicare Other | Admitting: Psychiatry

## 2014-05-30 VITALS — BP 160/86 | HR 76 | Ht 62.0 in | Wt 182.8 lb

## 2014-05-30 DIAGNOSIS — F332 Major depressive disorder, recurrent severe without psychotic features: Secondary | ICD-10-CM

## 2014-05-30 NOTE — Progress Notes (Signed)
Patient ID: [name], female   DOB: Sep 14, 1945, 69 y.o.   MRN: [id number] Pt reported to [facility] for motor Threshold Determination procedure for Repetitve Transcranial Magnetic Stimulation treatment for Major Depressive Disorder. Pt completed a PHQ-9 with a score of 22 (severe depression). MD and tech were unable to isolate a motor response in this pt despite stimulating multiple areas at various levels of intensity up to 2 SMT. Biological markers such as aligning the coil with the tragus were used as a starting point for locating the motor cortex. Began hunt for motor cortex at 30 degrees SOA, per [employer] protocol. Searched from 4.0 cm to 9.0 cm anterior/posterior along the left side of the pt's head. Also adjusted the SOA between 11 and 35 degrees at multiple A/P locations. No motor response in the pt's right upper extremity was noted in any of the locations attempted. Facial twitch was noted at numerous locations that were stimulated. This response was most often noted in the pt's chin, but eye blinking was also noted in lower SOA locations. Discontinued procedure after approximately 40 minutes of continuous hunting due to possibility of cortical flooding. Pt will return to clinic tomorrow morning to resume procedure.

## 2014-05-31 ENCOUNTER — Ambulatory Visit (INDEPENDENT_AMBULATORY_CARE_PROVIDER_SITE_OTHER): Payer: Medicare Other | Admitting: Psychiatry

## 2014-05-31 VITALS — BP 134/68 | HR 80 | Ht 62.0 in | Wt 182.0 lb

## 2014-05-31 DIAGNOSIS — F332 Major depressive disorder, recurrent severe without psychotic features: Secondary | ICD-10-CM | POA: Diagnosis not present

## 2014-06-01 ENCOUNTER — Other Ambulatory Visit (HOSPITAL_COMMUNITY): Payer: Medicare Other | Attending: Psychiatry | Admitting: *Deleted

## 2014-06-01 VITALS — BP 146/78 | HR 75

## 2014-06-01 DIAGNOSIS — F332 Major depressive disorder, recurrent severe without psychotic features: Secondary | ICD-10-CM | POA: Diagnosis not present

## 2014-06-01 DIAGNOSIS — F329 Major depressive disorder, single episode, unspecified: Secondary | ICD-10-CM | POA: Diagnosis not present

## 2014-06-01 NOTE — Progress Notes (Signed)
Patient ID: [name], female   DOB: [birth year], 69 y.o.   MRN: [id number] Pt reported to [facility] for Repetitve Transcranial Magnetic Stimulation treatment for Major Depressive Disorder. Pt reported no change in medication regimen, alcohol/substance use, caffeine consumption, sleep pattern, or metal implant status since previous tx. Pt reported that her psychiatrist changed the way her Wellbutrin is ordered, and she plans to pick up the new rx today. Pt reported that she experienced mild discomfort in her scalp at the treatment site after leaving the clinic the previous day. Pt stated, "It wasn't really pain, it just felt kind of heavy." Pt reported that this sensation went away after approximately 1 hour with no intervention necessary. Pt tolerated tx well. Pt endorses discomfort in her left temple during stimulation, so coil angle was increased to +10 degrees, which partially mitigated the discomfort. %MT titrated up to 120% where it remained for the rest of tx. Pt with no complaints post-tx. Pt departed from clinic without issue.

## 2014-06-01 NOTE — Progress Notes (Signed)
Patient ID: [name], female   DOB: [birth year], 69 y.o.   MRN: [id number] Pt reported to [facility] for motor threshold determination for Repetitve Transcranial Magnetic Stimulation treatment for Major Depressive Disorder. Pt reported no change in medication regimen, alcohol/substance use, caffeine consumption, sleep pattern, or metal implant status since previous day. Pt's husband accompanied her for tx. Tx also attended by [employer] clinical trainer and additional RN, CMA, and MD who were all receiving [place] training. Obtained verbal consent from patient for all the aforementioned individuals to attend this procedure prior to escorting pt back to tx area. Attempt motor threshold determination on the right side of the patient's head today, as left side attempts failed the previous day (see note dated 05/30/14). Placed coil on right side of pt's head according to anatomical markers (tragus and body angle). Coil was placed at 30 degrees along the superior oblique angle (SOA). Applied single pulses incrementally increasing the power until motor response was provoked. Initial motor response occurred at 1.23 SMT. Conducted anterior/posterior search at 1.23 SMT and SOA 30 degrees in order to isolated the most pronounced motor response. Search conducted from 4.3 cm to 6.8 cm. Most pronounced motor response observed at 5.3 cm, which was mostly isolated to the index finger. Adjusted SOA to isolate thumb twitch, but no response was observed at 25, 28, or 34 degrees. Index finger twitch was observed at 32 degrees, but was not as pronounced as that which was observed at 30 degrees. Switched coil to left side of pt's head attempting to duplicate these findings on the left side. No motor response was noted at 5.3 cm or 5.8 cm, with SOA 30 degrees and power remaining constant at 1.23 SMT. Switched coil back to right side to calculate pt's motor threshold using MT assist algorithm. Pt motor  threshold calculated at 1.31 SMT. According to the above parameters, pt's treatment location coordinates are as follows: A/P -- 10.8 cm, SOA -- 30 degrees, and coil angle -- 0 degrees. Pt will receive a standard course of HF-rTMS on the left side at 3000 pulses per session, 10 pulses per second, 4 seconds of stimulation, and 26 seconds of rest between bursts of stimulation. Pt also received her first treatment session after motor threshold determination procedure. %MT titrated up from 80% to 105%. Will attempt to titrate up to 120% at next tx session. Pt endorsed significant discomfort at outset of tx, so coil angle was increased to +5 degrees. This did not mitigate pt's discomfort, so coil angle was further increased to +10 degrees, which significantly mitigated pt's discomfort. Pt tolerated tx well. Pt with no complaints post-tx. Pt departed from clinic without issue.   [employer] mapping done Hampton Abbot, MD

## 2014-06-04 ENCOUNTER — Encounter (HOSPITAL_COMMUNITY): Payer: Self-pay | Admitting: Psychiatry

## 2014-06-04 ENCOUNTER — Other Ambulatory Visit (HOSPITAL_COMMUNITY): Payer: Medicare Other

## 2014-06-04 ENCOUNTER — Other Ambulatory Visit (HOSPITAL_COMMUNITY): Payer: Medicare Other | Attending: Psychiatry | Admitting: *Deleted

## 2014-06-04 VITALS — BP 139/76 | HR 81

## 2014-06-04 DIAGNOSIS — F329 Major depressive disorder, single episode, unspecified: Secondary | ICD-10-CM | POA: Diagnosis not present

## 2014-06-04 DIAGNOSIS — F332 Major depressive disorder, recurrent severe without psychotic features: Secondary | ICD-10-CM | POA: Insufficient documentation

## 2014-06-04 NOTE — Progress Notes (Signed)
[place] mapping done, unable to complete

## 2014-06-04 NOTE — Progress Notes (Signed)
Patient ID: [name], female   DOB: 06/04/1945, 69 y.o.   MRN: [id number] Pt reported to [facility] for Repetitive Transcranial Magnetic Stimulation treatment for Major Depressive Disorder. Pt reported no change in, alcohol/substance use, caffeine consumption, sleep pattern, or metal implant status since previous tx. Pt was prescribed Wellbutrin immediate release 100 mg BID by her psychiatrist. She began this regimen on 06/03/2014 to replace her previous rx for Wellbutrin XL 150 mg QD due to insomnia. Pt reported that she has only been taking her new rx once per day (as opposed to twice as it is prescribed) due to not getting out of bed early enough for the morning dose. Pt reported that she has been feeling more tired today than normal, and she has been more tearful. Pt acknowledged that she should take the Wellbutrin as prescribed, which might help with the symptoms she is experiencing today. Pt reported that she had a "very slight" headache after her last tx session which resolved after approximately 1 hour without intervention. Pt tolerated TMS tx well today. At outset of tx, pt endorsed a mild pulsating sensation in her nose during stimulation. Pt stated that the sensation was tolerable, so tx was not stopped. This sensation went away within the first 10 minutes of tx. %MT remained at 120% for the duration of tx. Pt with no complaints post-tx. Pt departed from clinic without issue.

## 2014-06-05 ENCOUNTER — Other Ambulatory Visit (INDEPENDENT_AMBULATORY_CARE_PROVIDER_SITE_OTHER): Payer: Medicare Other | Admitting: *Deleted

## 2014-06-05 ENCOUNTER — Other Ambulatory Visit (HOSPITAL_COMMUNITY): Payer: Medicare Other

## 2014-06-05 VITALS — BP 134/70 | HR 85

## 2014-06-05 DIAGNOSIS — F332 Major depressive disorder, recurrent severe without psychotic features: Secondary | ICD-10-CM | POA: Diagnosis not present

## 2014-06-05 DIAGNOSIS — F329 Major depressive disorder, single episode, unspecified: Secondary | ICD-10-CM | POA: Diagnosis not present

## 2014-06-05 NOTE — Progress Notes (Signed)
Patient ID: [name], female   DOB: March 07, 1946, 69 y.o.   MRN: [id number] Pt reported to [facility] for Repetitive Transcranial Magnetic Stimulation treatment for Major Depressive Disorder. Pt reported no change in alcohol/substance use, caffeine consumption, sleep pattern, or metal implant status since previous tx. Pt reported that she took her morning dose of Wellbutrin, and she plans to take the afternoon dose when she gets home from treatment. This will be the second consecutive day she has taken both of her daily doses. Pt also reported that she has been feeling less depressed today than she felt yesterday (yesterday she felt more depressed than normal), although she was tearful at times this morning. Pt stated she feels a slight increase in motivation today. Pt tolerated tx well. %MT remained at 120% for the duration of tx. Pt with no complaints post-tx. Pt departed from clinic without issue.

## 2014-06-06 ENCOUNTER — Other Ambulatory Visit (INDEPENDENT_AMBULATORY_CARE_PROVIDER_SITE_OTHER): Payer: Medicare Other | Admitting: *Deleted

## 2014-06-06 ENCOUNTER — Other Ambulatory Visit (HOSPITAL_COMMUNITY): Payer: Medicare Other

## 2014-06-06 VITALS — BP 145/73 | HR 80 | Ht 62.0 in | Wt 181.4 lb

## 2014-06-06 DIAGNOSIS — F329 Major depressive disorder, single episode, unspecified: Secondary | ICD-10-CM | POA: Diagnosis not present

## 2014-06-06 DIAGNOSIS — F332 Major depressive disorder, recurrent severe without psychotic features: Secondary | ICD-10-CM

## 2014-06-06 NOTE — Progress Notes (Signed)
Patient ID: [name], female   DOB: [birth year], 69 y.o.   MRN: [id number] Pt reported to [facility] for Repetitive Transcranial Magnetic Stimulation treatment for Major Depressive Disorder. Pt reported no change in medication regimen, alcohol/substance use, caffeine consumption, sleep pattern, or metal implant status since previous tx. Pt tolerated tx well. %MT remained at 120% for the duration of tx. Pt with no complaints post-tx. After tx session, pt completed a PHQ-9 with a score of 21 (severe depression). This is slightly decreased from pt's baseline score of 22 taken on 05/30/14. Pt departed from clinic without issue.

## 2014-06-07 ENCOUNTER — Other Ambulatory Visit (INDEPENDENT_AMBULATORY_CARE_PROVIDER_SITE_OTHER): Payer: Medicare Other | Admitting: *Deleted

## 2014-06-07 ENCOUNTER — Other Ambulatory Visit (HOSPITAL_COMMUNITY): Payer: Medicare Other

## 2014-06-07 VITALS — BP 140/67 | HR 81

## 2014-06-07 DIAGNOSIS — F332 Major depressive disorder, recurrent severe without psychotic features: Secondary | ICD-10-CM | POA: Diagnosis not present

## 2014-06-07 DIAGNOSIS — F329 Major depressive disorder, single episode, unspecified: Secondary | ICD-10-CM | POA: Diagnosis not present

## 2014-06-07 NOTE — Progress Notes (Signed)
Patient ID: [name], female   DOB: Sep 07, 1945, 69 y.o.   MRN: [id number] Pt reported to [facility] for Repetitive Transcranial Magnetic Stimulation treatment for Major Depressive Disorder. Pt reported no change in medication regimen, alcohol/substance use, caffeine consumption, sleep pattern, or metal implant status since previous tx. Pt reported that she is doing well with the treatment, experiencing no intolerable side effects during or after treatment sessions. Pt tolerated tx well. %MT remained at 120% for the duration of tx. Pt with no complaints post-tx. Pt departed from clinic without issue.

## 2014-06-08 ENCOUNTER — Other Ambulatory Visit (INDEPENDENT_AMBULATORY_CARE_PROVIDER_SITE_OTHER): Payer: Medicare Other | Admitting: *Deleted

## 2014-06-08 ENCOUNTER — Other Ambulatory Visit (HOSPITAL_COMMUNITY): Payer: Medicare Other

## 2014-06-08 VITALS — BP 130/68 | HR 75

## 2014-06-08 DIAGNOSIS — F329 Major depressive disorder, single episode, unspecified: Secondary | ICD-10-CM | POA: Diagnosis not present

## 2014-06-08 DIAGNOSIS — F332 Major depressive disorder, recurrent severe without psychotic features: Secondary | ICD-10-CM | POA: Diagnosis not present

## 2014-06-08 NOTE — Progress Notes (Signed)
Patient ID: [name], female   DOB: [birth year], 69 y.o.   MRN: [id number] Pt reported to [facility] for Repetitive Transcranial Magnetic Stimulation treatment for Major Depressive Disorder. Pt reported no change in medication regimen, alcohol/substance use, caffeine consumption, sleep pattern, or metal implant status since previous tx. Pt reported that she has continued to feel more motivated than normal, and she has had more energy. Pt reported that she is getting out of bed consistently in the morning, which is an improvement, as she had become accustomed to staying in bed for most of the day. Further TMS tx is warranted, as the pt appears to be benefiting from the tx, but still has much room for progress. Pt tolerated tx well. %MT remained at 120% for the duration of tx. Pt with no complaints post-tx. Pt departed from clinic without issue.

## 2014-06-11 ENCOUNTER — Other Ambulatory Visit (HOSPITAL_COMMUNITY): Payer: Medicare Other

## 2014-06-11 ENCOUNTER — Other Ambulatory Visit (INDEPENDENT_AMBULATORY_CARE_PROVIDER_SITE_OTHER): Payer: Medicare Other | Admitting: *Deleted

## 2014-06-11 VITALS — BP 148/74 | HR 78

## 2014-06-11 DIAGNOSIS — F332 Major depressive disorder, recurrent severe without psychotic features: Secondary | ICD-10-CM

## 2014-06-11 DIAGNOSIS — F329 Major depressive disorder, single episode, unspecified: Secondary | ICD-10-CM | POA: Diagnosis not present

## 2014-06-11 NOTE — Progress Notes (Signed)
Patient ID: [name], female   DOB: 03/09/1946, 69 y.o.   MRN: [id number] Pt reported to [facility] for Repetitive Transcranial Magnetic Stimulation treatment for Major Depressive Disorder. Pt reported no change in medication regimen, alcohol/substance use, caffeine consumption, or metal implant status since previous tx. Pt stated that she slept less than normal over the weekend due to visiting her son in another city. Pt slept 9-10 hours per night over the weekend, whereas she normally sleeps 12 hours per night. Pt stated she did not require a nap on Saturday, which she feels is an improvement in her depressive symptoms. Pt tolerated tx well. %MT remained at 120% for the duration of tx. Pt with no complaints post-tx. Pt departed from clinic without issue.

## 2014-06-12 ENCOUNTER — Other Ambulatory Visit (INDEPENDENT_AMBULATORY_CARE_PROVIDER_SITE_OTHER): Payer: Medicare Other | Admitting: *Deleted

## 2014-06-12 ENCOUNTER — Other Ambulatory Visit (HOSPITAL_COMMUNITY): Payer: Medicare Other

## 2014-06-12 VITALS — BP 122/66 | HR 77

## 2014-06-12 DIAGNOSIS — F332 Major depressive disorder, recurrent severe without psychotic features: Secondary | ICD-10-CM

## 2014-06-12 DIAGNOSIS — F329 Major depressive disorder, single episode, unspecified: Secondary | ICD-10-CM | POA: Diagnosis not present

## 2014-06-12 NOTE — Progress Notes (Signed)
Patient ID: Mahina Salatino, female   DOB: Aug 27, 1945, 69 y.o.   MRN: [id number] Pt reported to [facility] for Repetitive Transcranial Magnetic Stimulation treatment for Major Depressive Disorder. Pt reported no change in medication regimen, alcohol/substance use, caffeine consumption, sleep pattern, or metal implant status since previous tx. Pt reported that she is experiencing increased levels of fatigue and depression today, whereas she felt that she had been improving since beginning [place] tx. Pt slightly tearful during tx. Pt stated "I don't even know why I'm crying. I'm not thinking about anything sad." Pt's affect brightened later in tx session. Pt tolerated tx well. %MT remained at 120% for the duration of tx. Pt with no complaints post-tx. Pt departed from clinic without issue.

## 2014-06-13 ENCOUNTER — Other Ambulatory Visit (INDEPENDENT_AMBULATORY_CARE_PROVIDER_SITE_OTHER): Payer: Medicare Other | Admitting: *Deleted

## 2014-06-13 ENCOUNTER — Other Ambulatory Visit (HOSPITAL_COMMUNITY): Payer: Medicare Other

## 2014-06-13 VITALS — BP 149/79 | HR 85 | Ht 62.0 in | Wt 182.2 lb

## 2014-06-13 DIAGNOSIS — F332 Major depressive disorder, recurrent severe without psychotic features: Secondary | ICD-10-CM | POA: Diagnosis not present

## 2014-06-13 DIAGNOSIS — F329 Major depressive disorder, single episode, unspecified: Secondary | ICD-10-CM | POA: Diagnosis not present

## 2014-06-13 NOTE — Progress Notes (Signed)
Patient ID: Natacia Chaisson, female   DOB: 1946-02-09, 69 y.o.   MRN: [id number] Pt reported to [facility] for Repetitive Transcranial Magnetic Stimulation treatment for Major Depressive Disorder. Pt reported no change in medication regimen, alcohol/substance use, caffeine consumption, sleep pattern, or metal implant status since previous tx. Pt somewhat tearful at outset of treatment session. Pt stated she is unaware of a reason for her tearfulness. Pt's affect brightened after Probation officer started a conversation with her. Pt later Medical sales representative for distracting her. Pt completed a PHQ-9 with a score of 18 (moderately severe depression), which is decreased from her previous score of 21 (severe depression) on 06/06/14. Further tx is warranted, as pt appears to be responding to [place] tx. Pt tolerated tx well. %MT remained at 120% for the duration of tx. Pt with no complaints post-tx. Pt departed from clinic without issue.

## 2014-06-14 ENCOUNTER — Other Ambulatory Visit (HOSPITAL_COMMUNITY): Payer: Medicare Other

## 2014-06-15 ENCOUNTER — Other Ambulatory Visit (INDEPENDENT_AMBULATORY_CARE_PROVIDER_SITE_OTHER): Payer: Medicare Other | Admitting: *Deleted

## 2014-06-15 ENCOUNTER — Other Ambulatory Visit (HOSPITAL_COMMUNITY): Payer: Medicare Other

## 2014-06-15 VITALS — BP 135/77 | HR 73

## 2014-06-15 DIAGNOSIS — F332 Major depressive disorder, recurrent severe without psychotic features: Secondary | ICD-10-CM | POA: Diagnosis not present

## 2014-06-15 DIAGNOSIS — F329 Major depressive disorder, single episode, unspecified: Secondary | ICD-10-CM | POA: Diagnosis not present

## 2014-06-15 NOTE — Progress Notes (Signed)
Patient ID: [name], female   DOB: 1946/02/13, 69 y.o.   MRN: [id number] Pt reported to [facility] for Repetitive Transcranial Magnetic Stimulation treatment for Major Depressive Disorder. Pt reported no change in medication regimen, alcohol/substance use, caffeine consumption, sleep pattern, or metal implant status since previous tx. Pt reported that she had a "bad day" yesterday, being more tearful than normal in the morning and unable to get out of bed. Pt tolerated tx well. %MT remained at 120% for the duration of tx. Pt with no complaints post-tx. Pt departed from clinic without issue.

## 2014-06-18 ENCOUNTER — Other Ambulatory Visit (HOSPITAL_COMMUNITY): Payer: Medicare Other

## 2014-06-19 ENCOUNTER — Other Ambulatory Visit (INDEPENDENT_AMBULATORY_CARE_PROVIDER_SITE_OTHER): Payer: Medicare Other | Admitting: *Deleted

## 2014-06-19 ENCOUNTER — Other Ambulatory Visit (HOSPITAL_COMMUNITY): Payer: Medicare Other

## 2014-06-19 VITALS — BP 141/72 | HR 75

## 2014-06-19 DIAGNOSIS — F329 Major depressive disorder, single episode, unspecified: Secondary | ICD-10-CM | POA: Diagnosis not present

## 2014-06-19 DIAGNOSIS — F332 Major depressive disorder, recurrent severe without psychotic features: Secondary | ICD-10-CM | POA: Diagnosis not present

## 2014-06-19 NOTE — Progress Notes (Signed)
Patient ID: [name], female   DOB: 1945/08/25, 69 y.o.   MRN: [id number] Pt reported to [facility] for Repetitive Transcranial Magnetic Stimulation treatment for Major Depressive Disorder. Pt reported no change in medication regimen, alcohol/substance use, caffeine consumption, sleep pattern, or metal implant status since previous tx. Pt stated that her mood was elevated for the past three consecutive days, and she feels better than baseline today, although not as good as the past three days. Pt tolerated tx well. %MT remained at 120% for the duration of tx. Pt with no complaints post-tx. Pt departed from clinic without issue.

## 2014-06-20 ENCOUNTER — Other Ambulatory Visit (INDEPENDENT_AMBULATORY_CARE_PROVIDER_SITE_OTHER): Payer: Medicare Other | Admitting: *Deleted

## 2014-06-20 ENCOUNTER — Other Ambulatory Visit (HOSPITAL_COMMUNITY): Payer: Medicare Other

## 2014-06-20 VITALS — BP 139/71 | HR 77

## 2014-06-20 DIAGNOSIS — F332 Major depressive disorder, recurrent severe without psychotic features: Secondary | ICD-10-CM | POA: Diagnosis not present

## 2014-06-20 DIAGNOSIS — F329 Major depressive disorder, single episode, unspecified: Secondary | ICD-10-CM | POA: Diagnosis not present

## 2014-06-20 NOTE — Progress Notes (Signed)
Patient ID: [name], female   DOB: 1945/10/24, 69 y.o.   MRN: [id number] Pt reported to [facility] for Repetitive Transcranial Magnetic Stimulation treatment for Major Depressive Disorder. Pt reported no change in medication regimen, alcohol/substance use, caffeine consumption, sleep pattern, or metal implant status since previous tx. Pt reported that she began reading a new book today, which she characterized as a "huge improvement." Pt stated that reading is one of her past interests, so she is looking forward to starting again. This shows that the pt is continuing to improve, so further tx is warranted. Pt completed a PHQ-9 with a score of 21 (severe depression). This is slightly increased from the previous week's score of 18 (moderately severe depression). Pt tolerated tx well. %MT remained at 120% for the duration of tx. Pt with no complaints post-tx. Pt departed from clinic without issue.

## 2014-06-21 ENCOUNTER — Other Ambulatory Visit (HOSPITAL_COMMUNITY): Payer: Medicare Other

## 2014-06-21 ENCOUNTER — Other Ambulatory Visit (INDEPENDENT_AMBULATORY_CARE_PROVIDER_SITE_OTHER): Payer: Medicare Other | Admitting: *Deleted

## 2014-06-21 VITALS — BP 124/64 | HR 80

## 2014-06-21 DIAGNOSIS — F329 Major depressive disorder, single episode, unspecified: Secondary | ICD-10-CM | POA: Diagnosis not present

## 2014-06-21 DIAGNOSIS — E039 Hypothyroidism, unspecified: Secondary | ICD-10-CM | POA: Diagnosis not present

## 2014-06-21 DIAGNOSIS — F332 Major depressive disorder, recurrent severe without psychotic features: Secondary | ICD-10-CM

## 2014-06-21 DIAGNOSIS — G4700 Insomnia, unspecified: Secondary | ICD-10-CM | POA: Diagnosis not present

## 2014-06-21 NOTE — Progress Notes (Signed)
Patient ID: [name], female   DOB: [birth year], 69 y.o.   MRN: [id number]  Pt reported to [facility] for Repetitive Transcranial Magnetic Stimulation treatment for Major Depressive Disorder. Pt reported no change in alcohol/substance use, caffeine consumption, sleep pattern, or metal implant status since previous tx. Pt reported that she forgot to take her 100 mg dose of Wellbutrin yesterday afternoon. Pt tolerated tx well. %MT remained at 120% for the duration of tx. Pt with no complaints post-tx. Pt departed from clinic without issue.

## 2014-06-22 ENCOUNTER — Other Ambulatory Visit (INDEPENDENT_AMBULATORY_CARE_PROVIDER_SITE_OTHER): Payer: Medicare Other | Admitting: *Deleted

## 2014-06-22 ENCOUNTER — Other Ambulatory Visit (HOSPITAL_COMMUNITY): Payer: Medicare Other

## 2014-06-22 VITALS — BP 128/74 | HR 75

## 2014-06-22 DIAGNOSIS — F332 Major depressive disorder, recurrent severe without psychotic features: Secondary | ICD-10-CM | POA: Diagnosis not present

## 2014-06-22 DIAGNOSIS — F329 Major depressive disorder, single episode, unspecified: Secondary | ICD-10-CM | POA: Diagnosis not present

## 2014-06-22 NOTE — Progress Notes (Signed)
Patient ID: [name], female   DOB: 07/13/1945, 69 y.o.   MRN: [id number] Pt reported to [facility] for Repetitive Transcranial Magnetic Stimulation treatment for Major Depressive Disorder.  Pt reported no change in medication regimen, alcohol/substance use, caffeine consumption, or metal implant status since previous tx. Pt reported she felt tired yesterday afternoon, so she took a 1.5 hour nap. She also slept later this morning that she normally does. Pt attributed the lack of energy to "going out every day, whereas I normally stay at home." Pt tolerated tx well. %MT remained at 120% for the duration of tx. Pt with no complaints post-tx. Pt departed from clinic without issue.

## 2014-06-25 ENCOUNTER — Other Ambulatory Visit (INDEPENDENT_AMBULATORY_CARE_PROVIDER_SITE_OTHER): Payer: Medicare Other | Admitting: *Deleted

## 2014-06-25 ENCOUNTER — Other Ambulatory Visit (HOSPITAL_COMMUNITY): Payer: Medicare Other

## 2014-06-25 VITALS — BP 140/75 | HR 73

## 2014-06-25 DIAGNOSIS — F332 Major depressive disorder, recurrent severe without psychotic features: Secondary | ICD-10-CM

## 2014-06-25 DIAGNOSIS — F329 Major depressive disorder, single episode, unspecified: Secondary | ICD-10-CM | POA: Diagnosis not present

## 2014-06-25 NOTE — Progress Notes (Signed)
Patient ID: [name], female   DOB: December 11, 1945, 69 y.o.   MRN: [id number] Pt reported to [facility] for Repetitive Transcranial Magnetic Stimulation treatment for Major Depressive Disorder. Pt reported no change in medication regimen, alcohol/substance use, caffeine consumption, or metal implant status since previous tx. Pt reported that she felt a higher level of fatigue over the weekend than normal, so she slept more. Pt stated that her mood has continued to improve. Pt tolerated tx well. %MT remained at 120% for the duration of tx. Pt with no complaints post-tx. Pt departed from clinic without issue.

## 2014-06-26 ENCOUNTER — Other Ambulatory Visit (HOSPITAL_COMMUNITY): Payer: Medicare Other

## 2014-06-26 ENCOUNTER — Other Ambulatory Visit (INDEPENDENT_AMBULATORY_CARE_PROVIDER_SITE_OTHER): Payer: Medicare Other | Admitting: *Deleted

## 2014-06-26 VITALS — BP 147/76 | HR 77

## 2014-06-26 DIAGNOSIS — F332 Major depressive disorder, recurrent severe without psychotic features: Secondary | ICD-10-CM | POA: Diagnosis not present

## 2014-06-26 DIAGNOSIS — F329 Major depressive disorder, single episode, unspecified: Secondary | ICD-10-CM | POA: Diagnosis not present

## 2014-06-26 NOTE — Progress Notes (Signed)
Patient ID: [name], female   DOB: 1945-05-22, 69 y.o.   MRN: [id number] Pt reported to [facility] for Repetitive Transcranial Magnetic Stimulation treatment for Major Depressive Disorder. Hope Pigeon, CMA, as well as [employer] clinical training staff participated in tx, as well, in order to continue Ms. Smith's [place] training. Pt reported no change in medication regimen, alcohol/substance use, caffeine consumption, sleep pattern, or metal implant status since previous tx. Hope Pigeon, CMA set pt up in tx chair and place tx coil on pt's head according to pt's tx parameters. All parameters were verified by Probation officer and [employer] clinical training staff prior to beginning tx. Pt tolerated tx well. %MT remained at 120% for the duration of tx. Pt with no complaints post-tx. Pt departed from clinic without issue.

## 2014-06-27 ENCOUNTER — Other Ambulatory Visit (INDEPENDENT_AMBULATORY_CARE_PROVIDER_SITE_OTHER): Payer: Medicare Other | Admitting: *Deleted

## 2014-06-27 ENCOUNTER — Other Ambulatory Visit (HOSPITAL_COMMUNITY): Payer: Medicare Other

## 2014-06-27 VITALS — BP 142/73 | HR 76 | Ht 62.0 in | Wt 178.4 lb

## 2014-06-27 DIAGNOSIS — F329 Major depressive disorder, single episode, unspecified: Secondary | ICD-10-CM | POA: Diagnosis not present

## 2014-06-27 DIAGNOSIS — F332 Major depressive disorder, recurrent severe without psychotic features: Secondary | ICD-10-CM

## 2014-06-27 NOTE — Progress Notes (Signed)
Patient ID: [name], female   DOB: November 17, 1945, 69 y.o.   MRN: [id number] Pt reported to [facility] for Repetitive Transcranial Magnetic Stimulation treatment for Major Depressive Disorder. Pt reported no change in medication regimen, alcohol/substance use, caffeine consumption, sleep pattern, or metal implant status since previous tx. Pt completed a PHQ-9 with a score of 14 (moderate depression). This is reduced from the previous week's score of 21 (sever depression). Pt also completed a Beck's Depression Inventory with a score of 16, which is reduced from her baseline score of 34). Pt reported that she feels that her mood is lifting, and she feels a mch higher level of motivation to accomplish household tasks than she did before starting [place]. Pt is working with her LCSW to set reasonable goals for accomplishing more tasks on a daily basis. As the pt is responding to [place] tx, but has not yet reached remission, futher tx is warranted. Pt tolerated tx well. %MT remained at 120% for the duration of tx. Pt with no complaints post-tx. Pt departed from clinic without issue.

## 2014-06-28 ENCOUNTER — Other Ambulatory Visit (HOSPITAL_COMMUNITY): Payer: Medicare Other

## 2014-06-28 ENCOUNTER — Other Ambulatory Visit (INDEPENDENT_AMBULATORY_CARE_PROVIDER_SITE_OTHER): Payer: Medicare Other | Admitting: *Deleted

## 2014-06-28 VITALS — BP 135/63 | HR 76

## 2014-06-28 DIAGNOSIS — F332 Major depressive disorder, recurrent severe without psychotic features: Secondary | ICD-10-CM | POA: Diagnosis not present

## 2014-06-28 DIAGNOSIS — F329 Major depressive disorder, single episode, unspecified: Secondary | ICD-10-CM | POA: Diagnosis not present

## 2014-06-28 NOTE — Progress Notes (Signed)
Patient ID: [name], female   DOB: 1945/10/25, 69 y.o.   MRN: [id number] Pt reported to [facility] for Repetitive Transcranial Magnetic Stimulation treatment for Major Depressive Disorder. Pt reported no change in alcohol/substance use, caffeine consumption, sleep pattern, or metal implant status since previous tx. Pt stated she is no longer taking her afternoon dose of Wellbutrin 100mg . Pt stated that she stopped the afternoon dose "2 or 3 days ago" after she forgot to take it 2 days in a row but felt no change in her mood. Pt tolerated tx well. %MT remained at 120% for the duration of tx. Pt with no complaints post-tx. Pt departed from clinic without issue.

## 2014-06-29 ENCOUNTER — Other Ambulatory Visit (HOSPITAL_COMMUNITY): Payer: Medicare Other

## 2014-06-29 ENCOUNTER — Other Ambulatory Visit (INDEPENDENT_AMBULATORY_CARE_PROVIDER_SITE_OTHER): Payer: Medicare Other | Admitting: *Deleted

## 2014-06-29 VITALS — BP 142/76 | HR 77

## 2014-06-29 DIAGNOSIS — F332 Major depressive disorder, recurrent severe without psychotic features: Secondary | ICD-10-CM

## 2014-06-29 DIAGNOSIS — F329 Major depressive disorder, single episode, unspecified: Secondary | ICD-10-CM | POA: Diagnosis not present

## 2014-06-29 NOTE — Progress Notes (Signed)
Patient ID: [name], female   DOB: 1946-02-12, 69 y.o.   MRN: [id number] Pt reported to [facility] for Repetitive Transcranial Magnetic Stimulation treatment for Major Depressive Disorder. Pt reported no change in alcohol/substance use, caffeine consumption, sleep pattern, or metal implant status since previous tx. Pt reported that she forgot to take her afternoon dose of Wellbutrin again yesterday. Pt stated that she intends to take it today, as she has been tearful more than normal today. Pt tolerated tx well. %MT remained at 120% for the duration of tx. Pt with no complaints post-tx. Pt departed from clinic without issue.

## 2014-07-02 ENCOUNTER — Other Ambulatory Visit (INDEPENDENT_AMBULATORY_CARE_PROVIDER_SITE_OTHER): Payer: Medicare Other | Admitting: *Deleted

## 2014-07-02 ENCOUNTER — Other Ambulatory Visit (HOSPITAL_COMMUNITY): Payer: Medicare Other

## 2014-07-02 VITALS — BP 145/70 | HR 75

## 2014-07-02 DIAGNOSIS — F329 Major depressive disorder, single episode, unspecified: Secondary | ICD-10-CM | POA: Diagnosis not present

## 2014-07-02 DIAGNOSIS — F332 Major depressive disorder, recurrent severe without psychotic features: Secondary | ICD-10-CM

## 2014-07-02 NOTE — Progress Notes (Signed)
Patient ID: [name], female   DOB: [birth year], 69 y.o.   MRN: [id number] Pt reported to [facility] for Repetitive Transcranial Magnetic Stimulation treatment for Major Depressive Disorder. Pt reported no change in medication regimen, alcohol/substance use, caffeine consumption, sleep pattern, or metal implant status since previous tx. Pt reported that she has been taking her afternoon dose of Wellbutrin 100mg  the past several days. Pt tolerated tx well. %MT remained at 120% for the duration of tx. Pt with no complaints post-tx. Pt departed from clinic without issue.

## 2014-07-03 ENCOUNTER — Other Ambulatory Visit (HOSPITAL_COMMUNITY): Payer: Medicare Other | Attending: Psychiatry | Admitting: *Deleted

## 2014-07-03 ENCOUNTER — Other Ambulatory Visit (HOSPITAL_COMMUNITY): Payer: Medicare Other

## 2014-07-03 VITALS — BP 144/68 | HR 74

## 2014-07-03 DIAGNOSIS — F3341 Major depressive disorder, recurrent, in partial remission: Secondary | ICD-10-CM | POA: Diagnosis not present

## 2014-07-03 DIAGNOSIS — F332 Major depressive disorder, recurrent severe without psychotic features: Secondary | ICD-10-CM | POA: Diagnosis not present

## 2014-07-03 DIAGNOSIS — F329 Major depressive disorder, single episode, unspecified: Secondary | ICD-10-CM | POA: Insufficient documentation

## 2014-07-03 DIAGNOSIS — G4730 Sleep apnea, unspecified: Secondary | ICD-10-CM | POA: Diagnosis not present

## 2014-07-03 DIAGNOSIS — Z23 Encounter for immunization: Secondary | ICD-10-CM | POA: Diagnosis not present

## 2014-07-03 DIAGNOSIS — E039 Hypothyroidism, unspecified: Secondary | ICD-10-CM | POA: Diagnosis not present

## 2014-07-03 DIAGNOSIS — E2839 Other primary ovarian failure: Secondary | ICD-10-CM | POA: Diagnosis not present

## 2014-07-03 NOTE — Progress Notes (Signed)
Patient ID: [name], female   DOB: 1945/07/30, 69 y.o.   MRN: [id number] Pt reported to [facility] for Repetitive Transcranial Magnetic Stimulation treatment for Major Depressive Disorder. Pt reported no change in medication regimen, alcohol/substance use, caffeine consumption, sleep pattern, or metal implant status since previous tx. Pt tolerated tx well. %MT remained at 120% for the duration of tx. Pt with no complaints post-tx. Pt departed from clinic without issue.

## 2014-07-04 ENCOUNTER — Other Ambulatory Visit (INDEPENDENT_AMBULATORY_CARE_PROVIDER_SITE_OTHER): Payer: Medicare Other | Admitting: *Deleted

## 2014-07-04 ENCOUNTER — Other Ambulatory Visit (HOSPITAL_COMMUNITY): Payer: Medicare Other

## 2014-07-04 VITALS — BP 140/70 | HR 77

## 2014-07-04 DIAGNOSIS — F332 Major depressive disorder, recurrent severe without psychotic features: Secondary | ICD-10-CM

## 2014-07-04 DIAGNOSIS — F329 Major depressive disorder, single episode, unspecified: Secondary | ICD-10-CM | POA: Diagnosis not present

## 2014-07-04 NOTE — Progress Notes (Signed)
Patient ID: [name], female   DOB: 1945-09-17, 69 y.o.   MRN: [id number] Pt reported to [facility] for Repetitive Transcranial Magnetic Stimulation treatment for Major Depressive Disorder. Pt reported no change in medication regimen, alcohol/substance use, caffeine consumption, sleep pattern, or metal implant status since previous tx. Pt completed a PHQ-9 with a score of 7 (mild depression). This is a 50% reduction from her previous score of 14 on 06/27/14. Further tx is warranted to promote further symptom reduction, as the pt is close to remission. Pt tolerated tx well. %MT remained at 120% for the duration of tx. Pt with no complaints post-tx. Pt departed from clinic without issue.

## 2014-07-05 ENCOUNTER — Other Ambulatory Visit (INDEPENDENT_AMBULATORY_CARE_PROVIDER_SITE_OTHER): Payer: Medicare Other | Admitting: *Deleted

## 2014-07-05 ENCOUNTER — Other Ambulatory Visit (HOSPITAL_COMMUNITY): Payer: Medicare Other

## 2014-07-05 VITALS — BP 135/85 | HR 75

## 2014-07-05 DIAGNOSIS — F332 Major depressive disorder, recurrent severe without psychotic features: Secondary | ICD-10-CM | POA: Diagnosis not present

## 2014-07-05 DIAGNOSIS — F329 Major depressive disorder, single episode, unspecified: Secondary | ICD-10-CM | POA: Diagnosis not present

## 2014-07-05 NOTE — Progress Notes (Signed)
Patient ID: [name], female   DOB: 06-03-1945, 69 y.o.   MRN: [id number] Pt reported to [facility] for Repetitive Transcranial Magnetic Stimulation treatment for Major Depressive Disorder. Pt reported no change in medication regimen, alcohol/substance use, caffeine consumption, sleep pattern, or metal implant status since previous tx. Pt reported that she feels particularly sleepy and fatigued today. Pt unaware of what may have caused this. Pt tolerated tx well. %MT remained at 120% for the duration of tx. Pt with no complaints post-tx. Pt departed from clinic without issue.

## 2014-07-06 ENCOUNTER — Other Ambulatory Visit (INDEPENDENT_AMBULATORY_CARE_PROVIDER_SITE_OTHER): Payer: Medicare Other | Admitting: *Deleted

## 2014-07-06 ENCOUNTER — Other Ambulatory Visit (HOSPITAL_COMMUNITY): Payer: Medicare Other

## 2014-07-06 VITALS — BP 145/79 | HR 78

## 2014-07-06 DIAGNOSIS — F332 Major depressive disorder, recurrent severe without psychotic features: Secondary | ICD-10-CM | POA: Diagnosis not present

## 2014-07-06 DIAGNOSIS — F329 Major depressive disorder, single episode, unspecified: Secondary | ICD-10-CM | POA: Diagnosis not present

## 2014-07-06 NOTE — Progress Notes (Signed)
Patient ID: [name], female   DOB: Aug 19, 1945, 69 y.o.   MRN: [id number] Pt reported to [facility] for Repetitive Transcranial Magnetic Stimulation treatment for Major Depressive Disorder. Pt reported no change in medication regimen, alcohol/substance use, caffeine consumption, sleep pattern, or metal implant status since previous tx. Pt tolerated tx well. %MT remained at 120% for the duration of tx. Pt with no complaints post-tx. Pt departed from clinic without issue.

## 2014-07-09 ENCOUNTER — Other Ambulatory Visit (HOSPITAL_COMMUNITY): Payer: Medicare Other

## 2014-07-09 ENCOUNTER — Other Ambulatory Visit (INDEPENDENT_AMBULATORY_CARE_PROVIDER_SITE_OTHER): Payer: Medicare Other | Admitting: *Deleted

## 2014-07-09 VITALS — BP 137/68 | HR 82

## 2014-07-09 DIAGNOSIS — F329 Major depressive disorder, single episode, unspecified: Secondary | ICD-10-CM | POA: Diagnosis not present

## 2014-07-09 DIAGNOSIS — F332 Major depressive disorder, recurrent severe without psychotic features: Secondary | ICD-10-CM

## 2014-07-09 NOTE — Progress Notes (Signed)
Patient ID: [name], female   DOB: 31-Mar-1946, 69 y.o.   MRN: [id number] Pt reported to [facility] for Repetitive Transcranial Magnetic Stimulation treatment for Major Depressive Disorder. Pt reported no change in alcohol/substance use, caffeine consumption, sleep pattern, or metal implant status since previous tx. Pt reported that she did not take her lorazepam 1mg  Friday or Saturday night before bed. Pt reported that she did not sleep as well as a result, so she took .5 mg lorazepam before bed last night, and she stated she slept well. Pt complained of severe itching in both of her right extremities, beginning about a week ago. Pt asked if this may have anything to do with [place] tx. Writer informed pt that he is not aware of any such side effect associated with [place]. Pt tolerated tx well. %MT remained at 120% for the duration of tx. Pt with no complaints post-tx. Pt departed from clinic without issue.

## 2014-07-10 ENCOUNTER — Other Ambulatory Visit (INDEPENDENT_AMBULATORY_CARE_PROVIDER_SITE_OTHER): Payer: Medicare Other

## 2014-07-10 ENCOUNTER — Ambulatory Visit (HOSPITAL_COMMUNITY): Payer: Medicare Other | Admitting: Psychiatry

## 2014-07-10 ENCOUNTER — Other Ambulatory Visit (HOSPITAL_COMMUNITY): Payer: Medicare Other

## 2014-07-10 VITALS — BP 134/78 | HR 76

## 2014-07-10 DIAGNOSIS — F332 Major depressive disorder, recurrent severe without psychotic features: Secondary | ICD-10-CM

## 2014-07-10 DIAGNOSIS — F329 Major depressive disorder, single episode, unspecified: Secondary | ICD-10-CM | POA: Diagnosis not present

## 2014-07-10 NOTE — Progress Notes (Signed)
D. Patient presented to [facility] ordered continuation of  Repetitive Transcranial Magnetic Stimulation treatment for Major Depressive Disorder. Patient presented with appropriate affect, level mood and engaging in conversations.  Patient reported no change in her current medication regimen, alcohol or substance use, caffeine consumption, sleep pattern, or metal implant status since previous treatment.  A. [place] set to appropriate settings for patient's MT and patient remained at 120% MT for the treatment.  R. Patient tolerated treatment without any complaints of pain or discomfort.  Patient continued with no complaints after treatment and reminded the nurse she would not be able to attend treatment on 07/11/14 as her husband is having an MRI on tomorrow.  Patient agreed to return on Thursday 07/12/14 for next treatment and thanked patient for coming in earlier today for treatment with this nurse in the absence of [facility]-TMS coordinator.  Patient departed from clinic without issue.

## 2014-07-11 ENCOUNTER — Other Ambulatory Visit (HOSPITAL_COMMUNITY): Payer: Medicare Other

## 2014-07-11 DIAGNOSIS — F332 Major depressive disorder, recurrent severe without psychotic features: Secondary | ICD-10-CM | POA: Diagnosis not present

## 2014-07-11 DIAGNOSIS — G4700 Insomnia, unspecified: Secondary | ICD-10-CM | POA: Diagnosis not present

## 2014-07-12 ENCOUNTER — Encounter (HOSPITAL_COMMUNITY): Payer: Self-pay

## 2014-07-12 ENCOUNTER — Other Ambulatory Visit (INDEPENDENT_AMBULATORY_CARE_PROVIDER_SITE_OTHER): Payer: Medicare Other

## 2014-07-12 VITALS — BP 110/78 | HR 78 | Ht 62.0 in | Wt 176.4 lb

## 2014-07-12 DIAGNOSIS — F329 Major depressive disorder, single episode, unspecified: Secondary | ICD-10-CM | POA: Diagnosis not present

## 2014-07-12 DIAGNOSIS — F332 Major depressive disorder, recurrent severe without psychotic features: Secondary | ICD-10-CM

## 2014-07-12 NOTE — Progress Notes (Signed)
D. Patient presented to [facility] ordered continuation of Repetitive Transcranial Magnetic Stimulation treatment for Major Depressive Disorder. Patient presented with appropriate affect, level and pleasant mood and talkative today. Patient reported no change in her current medication regimen, alcohol or substance use, caffeine consumption, sleep pattern, or metal implant status since previous treatment.Patient reported she had enjoyed reading her book while waiting for treatment session today A. [place] set to appropriate settings for patient's MT and patient remained at 120% MT for the treatment. Patient scored a 5 on the PHQ9 depression scale as was not completed 07/10/13 due to patient having to miss one day of treatment taking her husband to another appointment.  Patient stated "that's the best I have scored" and reported feeling TMS was "really helping".  On a scale of 0-10 with 0 being not at all and 10 being the highest you have ever experienced, patient rated herself at a 0 for depression 0 for hopelessness and a 2 for anxiety.  R. Patient tolerated treatment session without any complaints of pain or discomfort. Patient remained talkative throughout the session and continued with no complaints after treatment.  Patient with stable vital signs of BP 110/60, pulse = 78 and weight 176.4 lbs.  Patient departed from clinic without and issues and will return for next session 07/13/14.

## 2014-07-13 ENCOUNTER — Other Ambulatory Visit (INDEPENDENT_AMBULATORY_CARE_PROVIDER_SITE_OTHER): Payer: Medicare Other

## 2014-07-13 VITALS — BP 118/76 | HR 75

## 2014-07-13 DIAGNOSIS — F329 Major depressive disorder, single episode, unspecified: Secondary | ICD-10-CM | POA: Diagnosis not present

## 2014-07-13 DIAGNOSIS — F332 Major depressive disorder, recurrent severe without psychotic features: Secondary | ICD-10-CM

## 2014-07-13 NOTE — Progress Notes (Signed)
D. Patient presented to [facility] ordered continuation of Repetitive Transcranial Magnetic Stimulation treatment for Major Depressive Disorder. Patient presented with appropriate affect, pleasant mood, smiling and engaging today. Patient reported no change in her current medication regimen, alcohol or substance use, caffeine consumption, sleep pattern, or metal implant status since previous treatment.Patient reported no real plans for the coming weekend and that she actually woke up a 1/2 hour earlier today and felt "fine".  A. [place] set to appropriate settings for patient's MT and patient remained at 120% MT for the treatment. R. Patient tolerated treatment session without any complaints of pain or discomfort. Patient remained engaging and spontaneously smiling during session as was related to appropriate conversations. Patient reported feeling "fine"  with no present concerns upon finishing this days session.  Patient departed from clinic without and issues and will return for next session 07/16/14.

## 2014-07-16 ENCOUNTER — Other Ambulatory Visit (INDEPENDENT_AMBULATORY_CARE_PROVIDER_SITE_OTHER): Payer: Medicare Other | Admitting: *Deleted

## 2014-07-16 VITALS — BP 143/79 | HR 75

## 2014-07-16 DIAGNOSIS — F332 Major depressive disorder, recurrent severe without psychotic features: Secondary | ICD-10-CM

## 2014-07-16 DIAGNOSIS — F329 Major depressive disorder, single episode, unspecified: Secondary | ICD-10-CM | POA: Diagnosis not present

## 2014-07-16 NOTE — Progress Notes (Signed)
Patient ID: [name], female   DOB: Sep 19, 1945, 69 y.o.   MRN: [id number] Pt reported to [facility] for Repetitive Transcranial Magnetic Stimulation treatment for Major Depressive Disorder. Pt reported no change in medication regimen, alcohol/substance use, caffeine consumption, sleep pattern, or metal implant status since previous tx. Pt reported "I feel even better the more [place] I get." Pt reported that her mood has remained level and euthymic, and she has also experienced higher motivation and less anxiety recently. Pt stated she plans to join a gym to start exercising soon. Pt tolerated tx well. %MT remained at 120% for the duration of tx. Pt with no complaints post-tx. Pt departed from clinic without issue.

## 2014-07-18 ENCOUNTER — Other Ambulatory Visit (INDEPENDENT_AMBULATORY_CARE_PROVIDER_SITE_OTHER): Payer: Medicare Other | Admitting: *Deleted

## 2014-07-18 VITALS — BP 139/73 | HR 75 | Wt 178.4 lb

## 2014-07-18 DIAGNOSIS — F332 Major depressive disorder, recurrent severe without psychotic features: Secondary | ICD-10-CM

## 2014-07-18 DIAGNOSIS — F329 Major depressive disorder, single episode, unspecified: Secondary | ICD-10-CM | POA: Diagnosis not present

## 2014-07-18 NOTE — Progress Notes (Signed)
Patient ID: Samuel Rittenhouse, female   DOB: 12/03/1945, 69 y.o.   MRN: [id number] Pt reported to [facility] for Repetitive Transcranial Magnetic Stimulation treatment for Major Depressive Disorder. Pt is in taper down phase of tx. Pt reported no change in medication regimen, alcohol/substance use, caffeine consumption, sleep pattern, or metal implant status since previous tx. Pt completed a PHQ-9 with a score of 3. Per PHQ-9, pt is now in remission from her depression. As pt is in taper down, further tx is warranted to complete taper down phase in order to prevent relapse of symptoms. Pt tolerated tx well. %MT remained at 120% for the duration of tx. Pt with no complaints post-tx. Pt departed from clinic without issue.

## 2014-07-19 DIAGNOSIS — M8580 Other specified disorders of bone density and structure, unspecified site: Secondary | ICD-10-CM | POA: Diagnosis not present

## 2014-07-19 DIAGNOSIS — Z1231 Encounter for screening mammogram for malignant neoplasm of breast: Secondary | ICD-10-CM | POA: Diagnosis not present

## 2014-07-19 DIAGNOSIS — M8588 Other specified disorders of bone density and structure, other site: Secondary | ICD-10-CM | POA: Diagnosis not present

## 2014-07-20 ENCOUNTER — Other Ambulatory Visit (INDEPENDENT_AMBULATORY_CARE_PROVIDER_SITE_OTHER): Payer: Medicare Other | Admitting: *Deleted

## 2014-07-20 VITALS — BP 144/71 | HR 74

## 2014-07-20 DIAGNOSIS — F332 Major depressive disorder, recurrent severe without psychotic features: Secondary | ICD-10-CM | POA: Diagnosis not present

## 2014-07-20 DIAGNOSIS — F329 Major depressive disorder, single episode, unspecified: Secondary | ICD-10-CM | POA: Diagnosis not present

## 2014-07-20 NOTE — Progress Notes (Signed)
Patient ID: [name], female   DOB: Jul 30, 1945, 69 y.o.   MRN: [id number] Pt reported to [facility] for Repetitive Transcranial Magnetic Stimulation treatment for Major Depressive Disorder. Pt reported with level mood and full, appropriate affect. Pt reported no change in medication regimen, alcohol/substance use, caffeine consumption, sleep pattern, or metal implant status since previous tx. Pt tolerated tx well. %MT remained at 120% for the duration of tx. Pt with no complaints post-tx. Pt departed from clinic without issue.

## 2014-07-23 ENCOUNTER — Other Ambulatory Visit (INDEPENDENT_AMBULATORY_CARE_PROVIDER_SITE_OTHER): Payer: Medicare Other | Admitting: *Deleted

## 2014-07-23 VITALS — BP 133/68 | HR 72

## 2014-07-23 DIAGNOSIS — F329 Major depressive disorder, single episode, unspecified: Secondary | ICD-10-CM | POA: Diagnosis not present

## 2014-07-23 DIAGNOSIS — F332 Major depressive disorder, recurrent severe without psychotic features: Secondary | ICD-10-CM | POA: Diagnosis not present

## 2014-07-23 NOTE — Progress Notes (Signed)
Patient ID: [name], female   DOB: 04-12-1946, 69 y.o.   MRN: [id number] Pt reported to [facility] for Repetitive Transcranial Magnetic Stimulation treatment for Major Depressive Disorder. Pt reported no change in medication regimen, alcohol/substance use, caffeine consumption, or metal implant status since previous tx. Pt reported that she felt more tired over the weekend, which resulted in sleeping more (approximately 12 hours per night with a 1.5 hour nap in the afternoon). Pt stated that she drastically reduced her carbohydrate intake Friday night (07/20/14) and through the weekend in an effort to eat healthier, which may have caused her lack of energy over the weekend. Pt tolerated tx well. %MT remained at 120% for the duration of tx. Pt with no complaints post-tx. Pt departed from clinic without issue.

## 2014-07-25 ENCOUNTER — Other Ambulatory Visit (INDEPENDENT_AMBULATORY_CARE_PROVIDER_SITE_OTHER): Payer: Medicare Other | Admitting: *Deleted

## 2014-07-25 VITALS — BP 134/70 | HR 77 | Ht 62.0 in | Wt 176.4 lb

## 2014-07-25 DIAGNOSIS — F332 Major depressive disorder, recurrent severe without psychotic features: Secondary | ICD-10-CM

## 2014-07-25 DIAGNOSIS — F329 Major depressive disorder, single episode, unspecified: Secondary | ICD-10-CM | POA: Diagnosis not present

## 2014-07-25 NOTE — Progress Notes (Signed)
Patient ID: [name], female   DOB: Dec 03, 1945, 69 y.o.   MRN: [id number] Pt reported to [facility] for Repetitive Transcranial Magnetic Stimulation treatment for Major Depressive Disorder. Pt reported no change in medication regimen, alcohol/substance use, caffeine consumption, sleep pattern, or metal implant status since previous tx. Pt completed a PHQ-9 with a score of 7 (mild depression). This is an increase from the previous week's score of 3. Pt reported that she has been sleeping poorly, and she still has little energy this week, which has decreased her level of motivation. Pt's affect is full and bright today. Pt stated that her mood has remained consistently good recently, but her fatigue has worsened. Pt reported that she is still limiting her carbohydrate intake. Pt tolerated tx well. %MT remained at 120% for the duration of tx. Pt with no complaints post-tx. Pt departed from clinic without issue.

## 2014-07-26 DIAGNOSIS — F332 Major depressive disorder, recurrent severe without psychotic features: Secondary | ICD-10-CM | POA: Diagnosis not present

## 2014-07-27 ENCOUNTER — Other Ambulatory Visit (INDEPENDENT_AMBULATORY_CARE_PROVIDER_SITE_OTHER): Payer: Medicare Other | Admitting: *Deleted

## 2014-07-27 VITALS — BP 135/66 | HR 76

## 2014-07-27 DIAGNOSIS — F332 Major depressive disorder, recurrent severe without psychotic features: Secondary | ICD-10-CM

## 2014-07-27 DIAGNOSIS — F329 Major depressive disorder, single episode, unspecified: Secondary | ICD-10-CM | POA: Diagnosis not present

## 2014-07-27 NOTE — Progress Notes (Signed)
Patient ID: [name], female   DOB: 02-08-1946, 69 y.o.   MRN: [id number] Pt reported to [facility] for Repetitive Transcranial Magnetic Stimulation treatment for Major Depressive Disorder. Pt continues taper down phase of TMS tx. Pt reported no change in medication regimen, alcohol/substance use, caffeine consumption, sleep pattern, or metal implant status since previous tx. Pt reported that she has continued to feel fatigued, but she has gotten things done by "forcing myself." Pt's affect bright, appropriate, and talkative today. Pt tolerated tx well. %MT remained at 120% for the duration of tx. Pt with no complaints post-tx. Pt departed from clinic without issue.

## 2014-07-31 ENCOUNTER — Other Ambulatory Visit (INDEPENDENT_AMBULATORY_CARE_PROVIDER_SITE_OTHER): Payer: Medicare Other | Admitting: *Deleted

## 2014-07-31 VITALS — BP 142/69 | HR 77 | Ht 62.0 in | Wt 176.2 lb

## 2014-07-31 DIAGNOSIS — F332 Major depressive disorder, recurrent severe without psychotic features: Secondary | ICD-10-CM

## 2014-07-31 DIAGNOSIS — F329 Major depressive disorder, single episode, unspecified: Secondary | ICD-10-CM | POA: Diagnosis not present

## 2014-07-31 NOTE — Progress Notes (Signed)
Patient ID: [name], female   DOB: Sep 10, 1945, 69 y.o.   MRN: [id number] Pt reported to [facility] for Repetitive Transcranial Magnetic Stimulation treatment for Major Depressive Disorder. This is the pt's final session in her course of [place] tx. Pt stated that she feels slightly nervous about receiving no more TMS after today, but otherwise, she feels good about the condition she is in regarding her depression. Pt reported no change in medication regimen, alcohol/substance use, caffeine consumption, sleep pattern, or metal implant status since previous tx. Pt completed a PHQ-9 with a score of 5 (mild depression), which is decreased from the previous week's score of 7. Pt also completed a Beck's Depression Inventory with a score of 10 (remission score), which is decreased from her score of 16 on 06/27/14. Pt tolerated tx well. %MT remained at 120% for the duration of tx. Pt with no complaints post-tx. Pt departed from clinic without issue. Pt will return to clinic for [place] follow visit with MD in approximately 1 month.

## 2014-08-09 DIAGNOSIS — F332 Major depressive disorder, recurrent severe without psychotic features: Secondary | ICD-10-CM | POA: Diagnosis not present

## 2014-08-13 DIAGNOSIS — F332 Major depressive disorder, recurrent severe without psychotic features: Secondary | ICD-10-CM | POA: Diagnosis not present

## 2014-08-13 DIAGNOSIS — G4700 Insomnia, unspecified: Secondary | ICD-10-CM | POA: Diagnosis not present

## 2014-08-24 NOTE — Op Note (Signed)
PATIENT NAME:  [name], [name] MR#:  [id number] DATE OF BIRTH:  July 04, 1945  DATE OF PROCEDURE:  05/23/2012  PREOPERATIVE DIAGNOSIS: Cataract, left eye.   POSTOPERATIVE DIAGNOSIS: Cataract, left eye.   PROCEDURE PERFORMED: Extracapsular cataract extraction using phacoemulsification with placement of Alcon [id number] 18.0-diopter posterior chamber lens with cylinder of 3.75 diopters. Serial [id number].   SURGEON: Loura Back. Edis Huish, M.D.   ANESTHESIA: 4% lidocaine and 0.75% Marcaine in a 50-50 mixture with 10 units/mL of Hyalex added, given as a peribulbar.   ANESTHESIOLOGIST: Julianne Handler, MD  COMPLICATIONS: None.   ESTIMATED BLOOD LOSS: Less than 1 mL.   DESCRIPTION OF PROCEDURE: The patient was brought to the operating room and had each eye anesthetized with topical proparacaine. Sitting upright and fixing on a distant target, a 3 and 9 position was marked on the left eye using an ASICO Toric marker. The marks were reinforced using a marking pen and the patient was placed supine. She was given IV sedation, peribulbar block. She was then prepped and draped in the usual fashion. The vertical rectus muscles were imbricated using 5-0 silk suture, bridle sutures. Toric marker was used to mark the 88 degree mark. Conjunctival peritomy was performed centered on 90 degrees and hemostasis obtained with cautery. A partial thickness scleral groove was made at the posterior surgical limbus and dissected anteriorly into clear cornea with an Alcon crescent knife. The anterior chamber was entered superotemporally through clear cornea with a paracentesis knife and through the lamellar dissection with a 2.6 mm keratome. DisCoVisc was used to replace the aqueous and a continuous tear circular capsulorrhexis was carried out. Hydrodelineation was used to loosen the nucleus and phacoemulsification was carried out in a divide and conquer technique. Irrigation-aspiration was used to remove the residual  cortex. The capsular bag was inflated with DisCoVisc and the intraocular lens was inserted in the capsular bag using [employer]. Ultrasound time by the way was 49.2 seconds with an average power of 19.5% and CDE 7.39. The marks on the optic were, at the base of the haptic, were aligned with the 88 degree mark. Irrigation-aspiration was used to remove the residual DisCoVisc. The wound was inflated with balanced salt and Miostat was injected through the paracentesis track. Cefuroxime was injected through the paracentesis track as well, under the lip of the capsule. The wound was checked for leaks, none were found. The conjunctiva was closed with cautery. The bridle sutures were removed. Two drops of Vigamox were placed on the eye, a shield was placed on the eye and the patient was discharged to the recovery room in good condition.  ____________________________ Loura Back Tomasita Beevers, MD sad:sb D: 05/23/2012 13:39:41 ET T: 05/23/2012 14:03:37 ET JOB#: [id number]  cc: Remo Lipps A. Teja Judice, MD, <Dictator> Martie Lee MD ELECTRONICALLY SIGNED 05/30/2012 13:18

## 2014-08-25 NOTE — Discharge Summary (Signed)
PATIENT NAME:  [name], [name] MR#:  [id number] DATE OF BIRTH:  [birth year]  DATE OF ADMISSION:  10/27/2013 DATE OF DISCHARGE:  10/30/2013  HOSPITAL COURSE: See dictated history and physical for details of admission. A 69 year old woman with major depression admitted to the hospital feeling like her mood was getting worse. She was feeling more anxious, more depressed, was considering requesting ECT. In the hospital, she did not engage in any dangerous or aggressive behavior. Showed no suicidal behavior. Medication was adjusted. She was initially continued on her prescription, Viibryd, but reported that she felt it was making her feel more anxious. It was discontinued and she was started back on Effexor 75 mg a day, which she had taken previously. At the time of discharge, she is reporting that her mood is feeling better and she is more calm and relaxed. Denies suicidal ideation. She is agreeable to an outpatient plan of going home and following up with her regular psychiatrist. The patient has been reassured that in the future if ECT is considered necessary she can be referred to me easily because Dr. Annitta Jersey is in the same office as me. The patient was continued on her combined thyroid treatment and remained medically stable.   DISCHARGE MEDICATIONS: Trazodone 100 mg at night as needed for sleep, Effexor extended-release 75 mg once per day, Lexapro 10 mg once a day, lorazepam 0.5 mg once every 4 hours as needed for anxiety, zolpidem 5 mg at night, omeprazole 40 mg once a day, estradiol 1 mg once a day, levothyroxine 75 mcg once a day, and [place] thyroid 30 mg once per day.   MENTAL STATUS EXAMINATION AT DISCHARGE: Neatly dressed and groomed woman, looks her stated age, cooperative with the interview, good eye contact, normal psychomotor activity. Speech normal rate, tone and volume. Affect upbeat, appropriate. Thoughts lucid. No evidence of loosening of associations. Denies auditory or visual hallucinations.  Shows good insight and judgment. Normal intelligence. Short and long-term memory intact.   LABORATORY RESULTS: Her TSH was low at 0.048, but her T4 and T3 update and free thyroxine were all within the normal range. Salicylates negative. Acetaminophen negative. Alcohol negative. Chemistry panel: Low AST of doubtful significance at 14. CBC normal. Urinalysis unremarkable. Drug screen all negative.   DISPOSITION: She will be discharged home and follow up with Dr. Annitta Jersey in the outpatient office.   DIAGNOSIS, PRINCIPAL AND PRIMARY:  AXIS I: Major depression, recurrent, severe.   SECONDARY DIAGNOSES: AXIS I: No further diagnosis.  AXIS II: Deferred.  AXIS III: Hypothyroidism and gastric reflux disease.  AXIS IV: Moderate to severe from chronic social impairment.  AXIS V: Functioning at time of discharge is 55.   ____________________________ Gonzella Lex, MD jtc:sb D: 10/30/2013 21:12:02 ET T: 10/31/2013 07:05:52 ET JOB#: [id number]  cc: Gonzella Lex, MD, <Dictator> Gonzella Lex MD ELECTRONICALLY SIGNED 11/06/2013 22:44

## 2014-08-25 NOTE — H&P (Signed)
PATIENT NAME:  [name], [name] MR#:  [id number] DATE OF BIRTH:  [birth year]  DATE OF ADMISSION:  11/14/2013  IDENTIFYING INFORMATION AND CHIEF COMPLAINT: This is a 69 year old woman with a history of severe depression who is being admitted for electroconvulsive therapy.  CHIEF COMPLAINT: "I'm nervous."   HISTORY OF PRESENT ILLNESS: Information obtained from the patient and the chart and the patient's husband. The patient reports long-standing depression going on for 40 years. Recently for the last couple of years, she has been consistently depressed. Mood feels down most of the time. Has little to no enjoyment in usual activities. Has very low energy all of the time. Sleep is okay on current medicine. Feels jittery,  nervous, and worried. Had some passive thoughts about hopelessness and wishing she were dead, but no plan to hurt himself. Does not have psychotic symptoms. The patient is currently seeing Dr. Annitta Jersey in the outpatient clinic here ,who has tried her on multiple medications. The patient has not responded to many antidepressants including serotonin reuptake inhibitors and other drugs and is referred for ECT consideration.   PAST PSYCHIATRIC HISTORY: As noted, she has had long-standing depression. She has one suicide attempt many, many years ago. She had a recent hospitalization here because depression. At that time, we could not start ECT because she came in just as I was going on vacation. She has tried tricyclics, serotonin reuptake inhibitors, and mood stabilizers. Never really had a manic episode.   SOCIAL HISTORY: Does not work. Lives with her husband, who is also retired.  Have 3 adult children. Has very little social activity.   PAST MEDICAL HISTORY: Hypothyroidism. He is on combination treatment. Gastric reflux. No history of coronary artery disease or stroke.   SUBSTANCE ABUSE HISTORY: Denies alcohol or drug abuse, current or in the past.   FAMILY HISTORY: None identified.   REVIEW  OF SYSTEMS: Depressed mood, lack of energy, lack of interest in usual activities. Fatigue. No acute pain. No pulmonary, cardiac or GI complaints. No suicidal intent or plan, although she is hopeless.   MENTAL STATUS EXAMINATION: Neatly dressed and groomed woman, looks her stated age, cooperative with the interview. Good eye contact. Normal psychomotor activity. Speech normal rate, tone and volume. Affect blunted, dysphoric. Mood stated as depressed. Thoughts are lucid, a little bit slow. No evidence of delusional thinking or loosening of associations. Denies auditory or visual hallucinations. Denies suicidal intent, but has passive suicidal thoughts. No homicidal ideation. Judgment and insight intact.  Short and long-term memory intact. Alert and oriented x 4.   PHYSICAL EXAMINATION:  GENERAL: The patient appears to be in no acute physical distress.  HEENT: Pupils are equal and reactive. Face symmetric. Oral mucosa dry.  NECK AND BACK: Nontender to palpation. She has full range of motion at all extremities. Strength and reflexes are symmetric and normal throughout. Gait is normal. Cranial nerves are symmetric and normal. Has symmetric touch and strength throughout.  LUNGS: Clear with no wheezes.  HEART: Regular rate and rhythm.  ABDOMEN: Soft, nontender, normal bowel sounds.   CURRENT VITAL SIGNS: Show a blood pressure 134/69, respirations 18, pulse 78, temperature 98.9.   LABORATORY RESULTS: Chest x-ray shows no disease. The chemistry panels show chloride elevated at 108, glucose 120, GFR just borderline at 60. CBC unremarkable.  Other labs pending.   ASSESSMENT: A 69 year old woman with recurrent severe major depression, unresponsive to multiple medications. She is currently taking Effexor-XR 150 mg a day. Continues on her thyroid hormone as well and  takes Ambien at night. The patient has been admitted for electroconvulsive therapy.   TREATMENT PLAN: The patient has been educated about ECT  including the risks and benefits and the treatment procedure. She has been given opportunity to ask questions. She is agreeable to proceeding with treatment. We will discontinue the Ativan she had been taking previously. Get laboratory studies including x-ray, urinalysis, and EKG completed. Orders are done, and she will be started on right unilateral treatment beginning tomorrow.   DIAGNOSIS, PRINCIPAL AND PRIMARY:  AXIS I: Major depression, severe, recurrent. No psychotic features.   SECONDARY DIAGNOSES:  AXIS I: No further.  AXIS II: Deferred.  AXIS III: Hypothyroidism.  AXIS IV: Moderate from chronic illness.  AXIS V: Functioning at time of evaluation 35.    ____________________________ Gonzella Lex, MD jtc:ts D: 11/14/2013 15:40:21 ET T: 11/14/2013 15:54:52 ET JOB#: [id number]  cc: Gonzella Lex, MD, <Dictator> Gonzella Lex MD ELECTRONICALLY SIGNED 11/19/2013 12:41

## 2014-08-25 NOTE — H&P (Signed)
PATIENT NAME:  [name], [name] MR#:  [id number] DATE OF BIRTH:  1945-06-22  DATE OF ADMISSION:  10/27/2013  IDENTIFYING INFORMATION AND CHIEF COMPLAINT: A 69 year old woman who presented to the Emergency Room reporting severe depression and passive suicidal thoughts, wanting psychiatric treatment.   CHIEF COMPLAINT: "I'm depressed."   HISTORY OF PRESENT ILLNESS: Information obtained from the patient and the chart. The patient states that she has been depressed on and off for 40 years or more. Her current depression, she says, has been going on for about 3 years. During that time, she has been down and sad most of the time along with anxiety. Currently her chief complaint is of severe fatigue. Says that she never gets out of bed, does not do anything. Does not feel like doing anything. She feels tired during the day and also feels tired and sleepy at night. Appetite is fine. Does not have any suicidal intent, but has had passive hopelessness and passive suicidal ideation. No psychotic symptoms. Denies any substance abuse. She is currently seeing a psychiatrist in [place] Dr. Lorine Bears and has been compliant with medication, but does not feel like any medicine has lasted long. She feels like she will get a transient improvement from medicines, but nothing that last longer.   PAST PSYCHIATRIC HISTORY: She has had one hospitalization in the past at [facility] a little over a year ago. Had one suicide attempt in the past by overdose when she was much younger. No other recent suicide attempts. Multiple medications including trycyclics, multiple serotonin reuptake inhibitors, and mood stabilizers, all with only transient improvement. No history identified of substance abuse. Does not describe anything that really sounds like a true manic episode or even hypomanic episode.   SOCIAL HISTORY: The patient does not work, she lives with her husband, who also does not work. They have three adult children who  she stays in touch with. 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   PAST MEDICAL HISTORY: The patient has hypothyroidism, which allegedly has been difficult to treat and is currently on a combination of Armour thyroid and levothyroxine. She also has gastric reflux symptoms. No history of coronary artery disease or diabetes.   REVIEW OF SYSTEMS: Fatigue. Depression. Negative thinking. Low mood. Tearfulness at times. No suicidal intent, but general hopelessness. No hallucinations. No delusions. No other specific physical symptoms besides the fatigue.   MENTAL STATUS EXAM: Casually dressed, adequately groomed woman, looks her stated age, cooperative with the interview. Eye contact good. Psychomotor activity a little slow. Speech is of normal rate, tone, and volume. Affect is sad, tearful much of the time. Mood stated as depressed. Thoughts are generally lucid. No sign of delusional thinking. Denies hallucinations. Has a sense of hopelessness, but no suicidal intent or plan. No homicidal ideation. General: Alert and oriented intact. Judgment and insight okay. Short and long-term memory grossly intact.   PHYSICAL EXAMINATION:  GENERAL: The patient appears to be in no acute physical distress.  HEENT: Pupils equal and reactive. Face symmetric. Oral mucosa dry.  NECK AND BACK: Nontender.  EXTREMITIES: Full range of motion at extremities. Strength and reflexes symmetric and normal throughout. Gait normal. NEUROLOGIC: Cranial nerves symmetric and normal.  LUNGS: Clear with no wheezes.  HEART: Regular rate and rhythm.  ABDOMEN: Soft, nontender, normal bowel sounds.  VITAL SIGNS: Currently, blood pressure 139/85, respirations 18, pulse 75, temperature 98.2.   LABORATORY RESULTS: Admission laboratories  include drug screen negative. CBC: Slightly elevated chloride. Nothing significant. Alcohol negative. CBC all normal.   URINALYSIS: No infection.    CURRENT MEDICATIONS: Armour Thyroid 30 mg a day, levothyroxine not clear on the dose, zolpidem 10 mg at night, Ativan 1 mg at night, appears to be currently taking Viibryd  20 mg per day, and also Lexapro 10 mg a day, and she is cross titrating those. Estradiol 1 mg per day and omeprazole 40 mg in the morning.   ALLERGIES: CODEINE.   ASSESSMENT: This is a 69 year old woman with major depression, severe, although without psychotic symptoms or acute suicidal threat.  She still has a history of suicide attempts and she is feeling more hopeless and desperate. Admitted to the hospital for stabilization and treatment.   TREATMENT PLAN: The patient was educated about treatment of depression. She inquired about ECT. She actually would be a potential ECT candidate.  Unfortunately, I will be out of the office all of the next week, so we will needs defer thinking about that for now. Currently, I will put her back on her usual antidepressants and other medications and try to get her involved in groups and activities on the unit. Daily individual and group psychotherapy and evaluation.   DIAGNOSIS, PRINCIPAL AND PRIMARY:  AXIS I: Major depression, severe, recurrent.   SECONDARY DIAGNOSES: AXIS I:  None further. AXIS II: No diagnosis.  AXIS III: Hypothyroidism and gastric reflux symptoms. AXIS IV: Moderate from the effect of her illness on her social life.  AXIS V: Functioning at time of evaluation is 64.    ____________________________ Gonzella Lex, MD jtc:ts D: 10/27/2013 14:24:10 ET T: 10/27/2013 15:22:18 ET JOB#: [id number]  cc: Gonzella Lex, MD, <Dictator> Gonzella Lex MD ELECTRONICALLY SIGNED 10/28/2013 1:49

## 2014-09-04 ENCOUNTER — Encounter (HOSPITAL_COMMUNITY): Payer: Self-pay | Admitting: Psychiatry

## 2014-09-04 ENCOUNTER — Ambulatory Visit (INDEPENDENT_AMBULATORY_CARE_PROVIDER_SITE_OTHER): Payer: Medicare Other | Admitting: Psychiatry

## 2014-09-04 VITALS — BP 134/78 | HR 77 | Ht 62.0 in | Wt 175.0 lb

## 2014-09-04 DIAGNOSIS — F332 Major depressive disorder, recurrent severe without psychotic features: Secondary | ICD-10-CM

## 2014-09-04 DIAGNOSIS — F322 Major depressive disorder, single episode, severe without psychotic features: Secondary | ICD-10-CM | POA: Diagnosis not present

## 2014-09-04 NOTE — Progress Notes (Signed)
[facility] [phone] Progress Note  [name] [id number] 69 y.o.  09/04/2014 2:43 PM  Chief Complaint: Doing good  History of Present Illness: Pt completed ECT at the end of March 2016. She states TMS was a great experience. She went in after failing ECT and states TMS was effective.   Today reports mood has drastically improved but she remains tired with excessive sleepiness. Does not think mood has declined since finished ECT. Pt has moments of depression every few days when she is very tried. Pt is sleeping 12 hrs/night and takes a 3 hr nap during the day. She is not able to a few things some days of the week. She is easily worn out. Pt has been isolating herself due to low energy. She has ongoing anhedonia and low motivation due to low energy.   Appetite is normal and weight is steady.   Denies crying spells, worthlessness and hopelessness.   States meds are not helping much with energy. Pt has been f/up with Dr. Maricela Bo her regular psychiatrists.   Pt had no concerns today or questions about her prognosis today.   Suicidal Ideation: No Plan Formed: No Patient has means to carry out plan: No  Homicidal Ideation: No Plan Formed: No Patient has means to carry out plan: No  Review of Systems: Psychiatric: Agitation: No Hallucination: No Depressed Mood: No Insomnia: No Hypersomnia: Yes Altered Concentration: No Feels Worthless: No Grandiose Ideas: No Belief In Special Powers: No New/Increased Substance Abuse: No Compulsions: No  Neurologic: Headache: No Seizure: No Paresthesias: No   Review of Systems  Constitutional: Positive for malaise/fatigue. Negative for fever, chills and weight loss.  HENT: Negative for congestion, ear pain, nosebleeds, sore throat and tinnitus.   Eyes: Negative for blurred vision, double vision and pain.  Respiratory: Negative for cough, sputum production and shortness of breath.   Cardiovascular: Negative for chest pain,  palpitations and leg swelling.  Gastrointestinal: Negative for heartburn, nausea, vomiting and abdominal pain.  Musculoskeletal: Positive for myalgias. Negative for back pain, joint pain and neck pain.  Skin: Negative for itching and rash.  Neurological: Positive for tremors. Negative for dizziness, sensory change, seizures, loss of consciousness and headaches.  Psychiatric/Behavioral: Negative.      Past Medical Family, Social History: lives in [place], [place]. Married has 2 sons.  reports that she has never smoked. She has never used smokeless tobacco. She reports that she drinks about 4.2 oz of alcohol per week. She reports that she does not use illicit drugs.  Family History  Problem Relation Age of Onset  . Bipolar disorder Neg Hx   . Depression Brother   . Suicidality Brother     Past Medical History  Diagnosis Date  . Hypothyroidism   . GERD (gastroesophageal reflux disease)   . Sleep apnea   . Major depressive disorder, recurrent episode, mild     Outpatient Encounter Prescriptions as of 09/04/2014  Medication Sig  . buPROPion (WELLBUTRIN) 100 MG tablet Take 100 mg by mouth 2 (two) times daily.  Marland Kitchen estradiol (ESTRACE) 0.5 MG tablet Take 1 tablet (0.5 mg total) by mouth daily.  Marland Kitchen levothyroxine (SYNTHROID, LEVOTHROID) 88 MCG tablet   . LORazepam (ATIVAN) 1 MG tablet   . omeprazole (PRILOSEC) 40 MG capsule   . thyroid (ARMOUR) 30 MG tablet Take 30 mg by mouth daily before breakfast.  . Vortioxetine HBr (BRINTELLIX) 10 MG TABS Take by mouth.  Marland Kitchen buPROPion (WELLBUTRIN XL) 150 MG 24 hr tablet   .  cephALEXin (KEFLEX) 500 MG capsule Take 1 capsule (500 mg total) by mouth 4 (four) times daily. (Patient not taking: Reported on 09/04/2014)  . chlorhexidine (PERIDEX) 0.12 % solution   . nystatin (MYCOSTATIN) 100000 UNIT/ML suspension   . zolpidem (AMBIEN) 5 MG tablet    No facility-administered encounter medications on file as of 09/04/2014.    Past Psychiatric  History/Hospitalization(s): Anxiety: No Bipolar Disorder: No Depression: Yes Mania: No Psychosis: No Schizophrenia: No Personality Disorder: No Hospitalization for psychiatric illness: Yes History of Electroconvulsive Shock Therapy: Yes Prior Suicide Attempts: No  Physical Exam: Constitutional:  BP 134/78 mmHg  Pulse 77  Ht 5\' 2"  (1.575 m)  Wt 175 lb (79.379 kg)  BMI 32.00 kg/m2  General Appearance: alert, oriented, no acute distress  Musculoskeletal: Strength & Muscle Tone: within normal limits Gait & Station: normal Patient leans: N/A  Mental Status Examination/Evaluation: Objective: Attitude: Calm and cooperative  Appearance: Fairly Groomed, appears to be stated age  Eye Contact::  Good  Speech:  Clear and Coherent and Normal Rate  Volume:  Normal  Mood:  euhtymic  Affect:  Full Range  Thought Process:  Goal Directed, Linear and Logical  Orientation:  Full (Time, Place, and Person)  Thought Content:  Negative  Suicidal Thoughts:  No  Homicidal Thoughts:  No  Judgement:  Good  Insight:  Good  Concentration: good  Memory: Immediate-good Recent-good Remote-good  Recall: fair  Language: fair  Gait and Station: normal  [employer] of Knowledge: average  Psychomotor Activity:  Normal  Akathisia:  No  Handed:  Right  AIMS (if indicated):  Facial and Oral Movements  Muscles of Facial Expression: None, normal  Lips and Perioral Area: None, normal  Jaw: None, normal  Tongue: None, normal Extremity Movements: Upper (arms, wrists, hands, fingers): None, normal  Lower (legs, knees, ankles, toes): None, normal,  Trunk Movements:  Neck, shoulders, hips: None, normal,  Overall Severity : Severity of abnormal movements (highest score from questions above): None, normal  Incapacitation due to abnormal movements: None, normal  Patient's awareness of abnormal movements (rate only patient's report): No Awareness, Dental Status  Current problems with teeth and/or  dentures?: No  Does patient usually wear dentures?: No    Assets:  Communication Skills Desire for Improvement Financial Resources/Insurance Housing Intimacy Leisure Time Resilience Social Support Engineer, drilling (Choose Three): Established Problem, Stable/Improving (1) and Review of Psycho-Social Stressors (1)  Assessment: Axis I: MDD- severe without psychotic features  Axis II: deferred  Axis III:  Past Medical History  Diagnosis Date  . Hypothyroidism   . GERD (gastroesophageal reflux disease)   . Sleep apnea   . Major depressive disorder, recurrent episode, mild    Axis IV: health stressors  Axis V: GAF   Plan: BECK 11 PHQ 8 Encouraged to f/up with her psychiatrist regarding her [place] and PCP regarding her energy issues  Pt denies SI and is at an acute low risk for suicide.Patient told to call clinic if any problems occur. Patient advised to go to ER if they should develop SI/HI, side effects, or if symptoms worsen. Has crisis numbers to call if needed. Pt verbalized understanding.  We will continue to f/up on regular basis with PHQ's Charlcie Cradle, MD 09/04/2014

## 2014-09-12 DIAGNOSIS — F332 Major depressive disorder, recurrent severe without psychotic features: Secondary | ICD-10-CM | POA: Diagnosis not present

## 2014-09-12 DIAGNOSIS — G4700 Insomnia, unspecified: Secondary | ICD-10-CM | POA: Diagnosis not present

## 2014-10-15 DIAGNOSIS — Z124 Encounter for screening for malignant neoplasm of cervix: Secondary | ICD-10-CM | POA: Diagnosis not present

## 2014-10-15 DIAGNOSIS — E079 Disorder of thyroid, unspecified: Secondary | ICD-10-CM | POA: Diagnosis not present

## 2014-10-15 DIAGNOSIS — Z0001 Encounter for general adult medical examination with abnormal findings: Secondary | ICD-10-CM | POA: Diagnosis not present

## 2014-10-15 DIAGNOSIS — N959 Unspecified menopausal and perimenopausal disorder: Secondary | ICD-10-CM | POA: Diagnosis not present

## 2014-10-15 DIAGNOSIS — R300 Dysuria: Secondary | ICD-10-CM | POA: Diagnosis not present

## 2014-10-15 DIAGNOSIS — R87810 Cervical high risk human papillomavirus (HPV) DNA test positive: Secondary | ICD-10-CM | POA: Diagnosis not present

## 2014-10-15 DIAGNOSIS — E039 Hypothyroidism, unspecified: Secondary | ICD-10-CM | POA: Diagnosis not present

## 2014-10-15 DIAGNOSIS — F3341 Major depressive disorder, recurrent, in partial remission: Secondary | ICD-10-CM | POA: Diagnosis not present

## 2014-10-15 DIAGNOSIS — G4733 Obstructive sleep apnea (adult) (pediatric): Secondary | ICD-10-CM | POA: Diagnosis not present

## 2014-10-17 ENCOUNTER — Encounter: Payer: Self-pay | Admitting: Psychiatry

## 2014-10-17 ENCOUNTER — Telehealth: Payer: Self-pay | Admitting: Psychiatry

## 2014-10-17 ENCOUNTER — Ambulatory Visit (INDEPENDENT_AMBULATORY_CARE_PROVIDER_SITE_OTHER): Payer: Medicare Other | Admitting: Psychiatry

## 2014-10-17 VITALS — BP 119/53 | HR 70 | Resp 12 | Ht 62.0 in

## 2014-10-17 DIAGNOSIS — F332 Major depressive disorder, recurrent severe without psychotic features: Secondary | ICD-10-CM | POA: Diagnosis not present

## 2014-10-17 DIAGNOSIS — I10 Essential (primary) hypertension: Secondary | ICD-10-CM | POA: Diagnosis not present

## 2014-10-17 DIAGNOSIS — E559 Vitamin D deficiency, unspecified: Secondary | ICD-10-CM | POA: Diagnosis not present

## 2014-10-17 DIAGNOSIS — E039 Hypothyroidism, unspecified: Secondary | ICD-10-CM | POA: Diagnosis not present

## 2014-10-17 DIAGNOSIS — N959 Unspecified menopausal and perimenopausal disorder: Secondary | ICD-10-CM | POA: Diagnosis not present

## 2014-10-17 DIAGNOSIS — Z0001 Encounter for general adult medical examination with abnormal findings: Secondary | ICD-10-CM | POA: Diagnosis not present

## 2014-10-17 DIAGNOSIS — E889 Metabolic disorder, unspecified: Secondary | ICD-10-CM | POA: Diagnosis not present

## 2014-10-17 LAB — HM PAP SMEAR: HM Pap smear: NORMAL

## 2014-10-17 MED ORDER — LORAZEPAM 1 MG PO TABS: 1.0000 mg | ORAL_TABLET | Freq: Every day | ORAL | Status: DC

## 2014-10-17 MED ORDER — VORTIOXETINE HBR 10 MG PO TABS: 30.0000 mg | ORAL_TABLET | Freq: Every day | ORAL | Status: DC

## 2014-10-17 NOTE — Progress Notes (Signed)
BH MD/PA/NP OP Progress Note  10/17/2014 12:08 PM [name]  MRN:  [id number]  Subjective: Patient returns for follow-up for major depressive disorder, recurrent, severe. She states that she ended up having to stop taking the Wellbutrin 100 mg twice a day. She states she had side effects of feeling tired and GI upset. She states she went back to her previous dose of 75 mg twice a day but ran out of those tablets. She states as such she has been off of the Wellbutrin completely. She states she feels better. She states she's been taking the [facility] as well as her Ativan at night and has felt her mood is good. She's been traveling on family vacations and she states she's been able to be active there. She states on the most recent trip she was active with her 67-year-old grandchild. Chief Complaint:  Chief Complaint    Depression     Visit Diagnosis:  No diagnosis found.  Past Medical History:  Past Medical History  Diagnosis Date  . Hypothyroidism   . GERD (gastroesophageal reflux disease)   . Sleep apnea   . Major depressive disorder, recurrent episode, mild     Past Surgical History  Procedure Laterality Date  . No past surgeries     Family History:  Family History  Problem Relation Age of Onset  . Bipolar disorder Neg Hx   . Depression Brother   . Suicidality Brother    Social History:  History   Social History  . Marital Status: Married    Spouse Name: N/A  . Number of Children: N/A  . Years of Education: N/A   Social History Main Topics  . Smoking status: Never Smoker   . Smokeless tobacco: Never Used  . Alcohol Use: 4.2 oz/week    0 Standard drinks or equivalent, 7 Glasses of wine per week     Comment: 1 drink per night  . Drug Use: No  . Sexual Activity: Not on file   Other Topics Concern  . Not on file   Social History Narrative   Additional History:   Assessment:   Musculoskeletal: Strength & Muscle Tone: within normal limits Gait & Station:  normal Patient leans: N/A  Psychiatric Specialty Exam: HPI  Review of Systems  Psychiatric/Behavioral: Negative for depression, suicidal ideas, hallucinations, memory loss and substance abuse. The patient is not nervous/anxious and does not have insomnia.     Blood pressure 119/53, pulse 70, resp. rate 12, height 5\' 2"  (1.575 m).There is no weight on file to calculate BMI.  General Appearance: Well Groomed  Eye Contact:  Good  Speech:  Clear and Coherent and Normal Rate  Volume:  Normal  Mood:  Better  Affect:  Congruent  Thought Process:  Linear and Logical  Orientation:  Full (Time, Place, and Person)  Thought Content:  Negative  Suicidal Thoughts:  No  Homicidal Thoughts:  No  Memory:  Immediate;   Good Recent;   Good Remote;   Good  Judgement:  Good  Insight:  Good  Psychomotor Activity:  Negative  Concentration:  Good  Recall:  Good  Fund of Knowledge: Good  Language: Good  Akathisia:  Negative  Handed:  Right unknown  AIMS (if indicated):  Not done  Assets:  Communication Skills Desire for Improvement Social Support  ADL's:  Intact  Cognition: WNL  Sleep:  hypersomnia   Is the patient at risk to self?  No. Has the patient been a risk to self in  the past 6 months?  No. Has the patient been a risk to self within the distant past?  No. Is the patient a risk to others?  No. Has the patient been a risk to others in the past 6 months?  No. Has the patient been a risk to others within the distant past?  No.  Current Medications: Current Outpatient Prescriptions  Medication Sig Dispense Refill  . estradiol (ESTRACE) 0.5 MG tablet Take 1 tablet (0.5 mg total) by mouth daily. 30 tablet 0  . levothyroxine (SYNTHROID, LEVOTHROID) 88 MCG tablet   2  . LORazepam (ATIVAN) 1 MG tablet Take 1 tablet (1 mg total) by mouth at bedtime. 30 tablet 2  . thyroid (ARMOUR) 30 MG tablet Take 30 mg by mouth daily before breakfast.    . Vortioxetine HBr (BRINTELLIX) 10 MG TABS Take by  mouth.    . zolpidem (AMBIEN) 5 MG tablet     . buPROPion (WELLBUTRIN XL) 150 MG 24 hr tablet   0  . buPROPion (WELLBUTRIN) 100 MG tablet Take 100 mg by mouth 2 (two) times daily.    . cephALEXin (KEFLEX) 500 MG capsule Take 1 capsule (500 mg total) by mouth 4 (four) times daily. (Patient not taking: Reported on 09/04/2014) 30 capsule 1  . chlorhexidine (PERIDEX) 0.12 % solution   0  . nystatin (MYCOSTATIN) 100000 UNIT/ML suspension   0  . omeprazole (PRILOSEC) 40 MG capsule   1  . Vortioxetine HBr 10 MG TABS Take 3 tablets (30 mg total) by mouth daily. 30 tablet 1   No current facility-administered medications for this visit.    Medical Decision Making:  Established Problem, Stable/Improving (1) A she has been reporting good and stable mood. She does have a follow-up with her [place] treatment team in approximately 6 months. However she states she feels good on her current regimen of Bryn Telex and lorazepam.  Treatment Plan Summary:Medication management We will decrease the Parente licks from 40 mg daily to 30 mg daily as the patient has been on a doses higher than the manufactured recommended maximum dose.   [name] 10/17/2014, 12:08 PM

## 2014-10-23 ENCOUNTER — Telehealth: Payer: Self-pay | Admitting: Psychiatry

## 2014-10-23 MED ORDER — VORTIOXETINE HBR 20 MG PO TABS: 20.0000 mg | ORAL_TABLET | Freq: Every day | ORAL | Status: DC

## 2014-10-23 NOTE — Telephone Encounter (Signed)
let pt know that rx was sent into pharmacy

## 2014-11-06 ENCOUNTER — Telehealth (HOSPITAL_COMMUNITY): Payer: Self-pay | Admitting: *Deleted

## 2014-11-06 NOTE — Telephone Encounter (Signed)
Called pt to conduct 90 day follow up for Transcranial Magnet Stimulation treatment program. Administered PHQ-9 over the phone, with pt scoring a 6, which is in the mild category of depression. Pt only endorsed two items on the PHQ-9; namely, hypersomnia and fatigue. Pt denied any depressed mood over the past two weeks. Pt reported that she recently had "a full work up" with her PCP, and she is also having an ultrasound of her heart later this week in an effort to determine the source of her profound fatigue and lack of energy. Pt stated that she feels that these symptoms are not a result of her depression, as she has not been feeling as depressed since receiving [place] treatment. Pt reported that she has been able to complete more tasks at home that she used to before beginning [place], but her hypersomnia and lack of energy prevent her from getting out as much as she would like. Will reach out to pt in approximately 3 months for 6 month follow up.

## 2014-11-07 DIAGNOSIS — G4733 Obstructive sleep apnea (adult) (pediatric): Secondary | ICD-10-CM | POA: Diagnosis not present

## 2014-11-08 DIAGNOSIS — R0602 Shortness of breath: Secondary | ICD-10-CM | POA: Diagnosis not present

## 2014-11-08 DIAGNOSIS — R079 Chest pain, unspecified: Secondary | ICD-10-CM | POA: Diagnosis not present

## 2014-11-21 ENCOUNTER — Encounter: Payer: Self-pay | Admitting: Psychiatry

## 2014-11-21 ENCOUNTER — Ambulatory Visit (INDEPENDENT_AMBULATORY_CARE_PROVIDER_SITE_OTHER): Payer: Medicare Other | Admitting: Psychiatry

## 2014-11-21 VITALS — BP 124/82 | HR 68 | Temp 98.0°F | Ht 62.0 in | Wt 176.6 lb

## 2014-11-21 DIAGNOSIS — F332 Major depressive disorder, recurrent severe without psychotic features: Secondary | ICD-10-CM

## 2014-11-21 MED ORDER — ZOLPIDEM TARTRATE 5 MG PO TABS: 5.0000 mg | ORAL_TABLET | Freq: Every evening | ORAL | Status: DC | PRN

## 2014-11-21 NOTE — Progress Notes (Signed)
BH MD/PA/NP OP Progress Note  11/21/2014 12:05 PM [name]  MRN:  [id number]  Subjective: Patient returns for follow-up for major depressive disorder, recurrent, severe. Patient is now taking her Trintellix and lorazepam. She states her mood has been good. She has discussed a return from a recent vacation in which they were able to snorkel with their grandchildren. She does feel her depression has improved. Her main issues continue to be low energy and low motivation. Her primary care is assessing other causes for this fatigue and she has been undergoing a cardiac workup which is included a cardiac ultrasound. She indicated her primary care wants to her to engage in an exercise program. 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  She is also undergoing additional sleep studies. She has a diagnosis of sleep apnea. She indicates she did use some Ambien from a prior prescription and she did feel like after using the Ambien she felt more rested the next day. She is requesting a prescription for this and states that she plans to use it only as needed and does not plan to use it every day. I did discuss the issues with sleep apnea and sleep medications however she states she is going to have follow-up with her sleep medicine doctor.  I did discuss with her any behavioral aspects to improving her motivation. I did discuss perhaps planning activities ahead of time and planning joint activities that involve others. She states she does engage in some activities with her husband such as trimming bushes. However I did discuss whether she plans ahead about activities she might be interested in doing. Patient catered sometime she is not always plan ahead.   Chief Complaint: "motivation" Chief Complaint    Depression; Medication Refill     Visit Diagnosis:  No diagnosis  found.  Past Medical History:  Past Medical History  Diagnosis Date  . Hypothyroidism   . GERD (gastroesophageal reflux disease)   . Sleep apnea   . Major depressive disorder, recurrent episode, mild     Past Surgical History  Procedure Laterality Date  . No past surgeries    . Abdominal hysterectomy    . Cholecystectomy     Family History:  Family History  Problem Relation Age of Onset  . Bipolar disorder Neg Hx   . Depression Brother   . Alcohol abuse Brother   . Stroke Mother   . Neuropathy Mother   . Suicidality Brother    Social History:  History   Social History  . Marital Status: Married    Spouse Name: N/A  . Number of Children: N/A  . Years of Education: N/A   Social History Main Topics  . Smoking status: Never Smoker   . Smokeless tobacco: Never Used  . Alcohol Use: 6.6 oz/week    0 Standard drinks or equivalent, 4 Glasses of wine, 1 Cans of beer, 6 Shots of liquor per week     Comment: 1 drink per night  . Drug Use: No  . Sexual Activity: Yes    Birth Control/ Protection: None   Other Topics Concern  . None   Social History Narrative   Additional History:   Assessment:   Musculoskeletal: Strength & Muscle Tone: within normal limits Gait & Station: normal Patient leans: N/A  Psychiatric Specialty Exam: HPI   Review of Systems  Psychiatric/Behavioral: Negative for depression, suicidal ideas, hallucinations, memory loss and substance abuse. The patient is not nervous/anxious and does not have insomnia.     Blood pressure 124/82, pulse 68, temperature 98 F (36.7 C), temperature source Tympanic, height 5\' 2"  (1.575 m), weight 176 lb 9.6 oz (80.105 kg), SpO2 97 %.Body mass index is 32.29 kg/(m^2).  General Appearance: Well Groomed  Eye Contact:  Good  Speech:  Clear and Coherent and Normal Rate  Volume:  Normal  Mood:  Good  Affect:  Congruent and , Smiling able to laugh.  Thought Process:  Linear and Logical  Orientation:  Full (Time,  Place, and Person)  Thought Content:  Negative  Suicidal Thoughts:  No  Homicidal Thoughts:  No  Memory:  Immediate;   Good Recent;   Good Remote;   Good  Judgement:  Good  Insight:  Good  Psychomotor Activity:  Negative  Concentration:  Good  Recall:  Good  Fund of Knowledge: Good  Language: Good  Akathisia:  Negative  Handed:  Right unknown  AIMS (if indicated):  Not done  Assets:  Communication Skills Desire for Improvement Social Support  ADL's:  Intact  Cognition: WNL  Sleep:  hypersomnia   Is the patient at risk to self?  No. Has the patient been a risk to self in the past 6 months?  No. Has the patient been a risk to self within the distant past?  No. Is the patient a risk to others?  No. Has the patient been a risk to others in the past 6 months?  No. Has the patient been a risk to others within the distant past?  No.  Current Medications: Current Outpatient Prescriptions  Medication Sig Dispense Refill  . amoxicillin (AMOXIL) 500 MG capsule   0  . chlorhexidine (PERIDEX) 0.12 % solution   0  . estradiol (ESTRACE) 0.5 MG tablet Take 1 tablet (0.5 mg total) by mouth daily. 30 tablet 0  . levothyroxine (SYNTHROID, LEVOTHROID) 75 MCG tablet   0  . LORazepam (ATIVAN) 1 MG tablet Take 1 tablet (1 mg total) by mouth at bedtime. 30 tablet 2  . omeprazole (PRILOSEC) 40 MG capsule   1  . thyroid (ARMOUR) 30 MG tablet Take 30 mg by mouth daily before breakfast.    . TRINTELLIX 20 MG TABS   0  . zolpidem (AMBIEN) 5 MG tablet Take 1 tablet (5 mg total) by mouth at bedtime as needed for sleep. 30 tablet 2   No current facility-administered medications for this visit.    Medical Decision Making:  Established Problem, Stable/Improving (1) A she has been reporting good and stable mood. She does have a follow-up with her [place] treatment team in approximately 6 months. However she states she feels good on her current regimen of Bryn Telex and lorazepam.  Treatment Plan  Summary:Medication management Continue the patient on Trintellix 20 mg daily, lorazepam 1 mg Qhs prn. Via the patient with a prescription for Ambien 5 mg daily at bedtime when necessary. Patient will follow up in 2 months. She's been encouraged call any questions or concerns prior to her next appointment. She should have adequate supply of her medications through her next appointment with the exception of a prescription for Ambien. Patient was provided a prescription for Ambien 5 mg, #30 with 2 refills. [name] 11/21/2014, 12:05 PM

## 2014-12-03 DIAGNOSIS — G2581 Restless legs syndrome: Secondary | ICD-10-CM | POA: Diagnosis not present

## 2014-12-03 DIAGNOSIS — G4733 Obstructive sleep apnea (adult) (pediatric): Secondary | ICD-10-CM | POA: Diagnosis not present

## 2014-12-11 DIAGNOSIS — E039 Hypothyroidism, unspecified: Secondary | ICD-10-CM | POA: Diagnosis not present

## 2014-12-11 DIAGNOSIS — F3341 Major depressive disorder, recurrent, in partial remission: Secondary | ICD-10-CM | POA: Diagnosis not present

## 2015-01-17 ENCOUNTER — Encounter: Payer: Self-pay | Admitting: Psychiatry

## 2015-01-17 ENCOUNTER — Ambulatory Visit (INDEPENDENT_AMBULATORY_CARE_PROVIDER_SITE_OTHER): Payer: Medicare Other | Admitting: Psychiatry

## 2015-01-17 VITALS — BP 122/86 | HR 82 | Temp 97.2°F | Ht 62.0 in | Wt 174.4 lb

## 2015-01-17 DIAGNOSIS — F332 Major depressive disorder, recurrent severe without psychotic features: Secondary | ICD-10-CM

## 2015-01-17 MED ORDER — TRINTELLIX 20 MG PO TABS: 20.0000 mg | ORAL_TABLET | Freq: Every day | ORAL | Status: DC

## 2015-01-17 MED ORDER — LORAZEPAM 1 MG PO TABS: 1.0000 mg | ORAL_TABLET | Freq: Every day | ORAL | Status: DC

## 2015-01-17 MED ORDER — ZOLPIDEM TARTRATE 5 MG PO TABS: 5.0000 mg | ORAL_TABLET | Freq: Every evening | ORAL | Status: DC | PRN

## 2015-01-17 NOTE — Progress Notes (Signed)
BH MD/PA/NP OP Progress Note  01/17/2015 2:42 PM [name]  MRN:  [id number]  Subjective: Patient returns for follow-up for major depressive disorder, recurrent, severe. Patient indicates she is continued on her regimen of Trintellix, lorazepam and Ambien. She states that she did experience a bad couple of days about 3 weeks ago where she was depressed. She then states that over the past 3 days she's been more irritable. She states she did get into some type of argument with her husband. She states that these things are concerning however she does state that overall she does feel her mood is fairly good but that these instances worry her.  Cuss that she's been on multiple medications and that she has ready had a trial of augmentation with Wellbutrin. I did discuss other augmentation strategies such as rexulti or Seroquel and she has not been on these medications. Also had been placed on lithium but did not take that for very long.  She does state that they continue to travel. She states they do have an upcoming trip to [place] and then another trip to [place] to see a new grandchild.   Chief Complaint: irritable Chief Complaint    Follow-up     Visit Diagnosis:  No diagnosis found.  Past Medical History:  Past Medical History  Diagnosis Date  . Hypothyroidism   . GERD (gastroesophageal reflux disease)   . Sleep apnea   . Major depressive disorder, recurrent episode, mild     Past Surgical History  Procedure Laterality Date  . No past surgeries    . Abdominal hysterectomy    . Cholecystectomy     Family History:  Family History  Problem Relation Age of Onset  . Bipolar disorder Neg Hx   . Depression Brother   . Alcohol abuse Brother   . Stroke Mother   . Neuropathy Mother   . Suicidality Brother    Social History:  Social History   Social History  . Marital Status: Married    Spouse Name: N/A  . Number of Children: N/A  . Years of Education: N/A   Social  History Main Topics  . Smoking status: Never Smoker   . Smokeless tobacco: Never Used  . Alcohol Use: 6.6 oz/week    0 Standard drinks or equivalent, 4 Glasses of wine, 1 Cans of beer, 6 Shots of liquor per week     Comment: 1 drink per night  . Drug Use: No  . Sexual Activity: Yes    Birth Control/ Protection: None   Other Topics Concern  . None   Social History Narrative   Additional History:   Assessment:   Musculoskeletal: Strength & Muscle Tone: within normal limits Gait & Station: normal Patient leans: N/A  Psychiatric Specialty Exam: HPI  Review of Systems  Psychiatric/Behavioral: Negative for depression, suicidal ideas, hallucinations, memory loss and substance abuse. The patient is nervous/anxious and has insomnia.     Blood pressure 122/86, pulse 82, temperature 97.2 F (36.2 C), temperature source Tympanic, height 5\' 2"  (1.575 m), weight 174 lb 6.4 oz (79.107 kg), SpO2 96 %.Body mass index is 31.89 kg/(m^2).  General Appearance: Well Groomed  Eye Contact:  Good  Speech:  Clear and Coherent and Normal Rate  Volume:  Normal  Mood:  irritable  Affect:  Constricted  Thought Process:  Linear and Logical  Orientation:  Full (Time, Place, and Person)  Thought Content:  Negative  Suicidal Thoughts:  No  Homicidal Thoughts:  No  Memory:  Immediate;   Good Recent;   Good Remote;   Good  Judgement:  Good  Insight:  Good  Psychomotor Activity:  Negative  Concentration:  Good  Recall:  Good  Fund of Knowledge: Good  Language: Good  Akathisia:  Negative  Handed:  Right unknown  AIMS (if indicated):  Not done  Assets:  Communication Skills Desire for Improvement Social Support  ADL's:  Intact  Cognition: WNL  Sleep:  hypersomnia   Is the patient at risk to self?  No. Has the patient been a risk to self in the past 6 months?  No. Has the patient been a risk to self within the distant past?  No. Is the patient a risk to others?  No. Has the patient been a  risk to others in the past 6 months?  No. Has the patient been a risk to others within the distant past?  No.  Current Medications: Current Outpatient Prescriptions  Medication Sig Dispense Refill  . amoxicillin (AMOXIL) 500 MG capsule   0  . chlorhexidine (PERIDEX) 0.12 % solution   0  . estradiol (ESTRACE) 0.5 MG tablet Take 1 tablet (0.5 mg total) by mouth daily. 30 tablet 0  . levothyroxine (SYNTHROID, LEVOTHROID) 75 MCG tablet   0  . LORazepam (ATIVAN) 1 MG tablet Take 1 tablet (1 mg total) by mouth at bedtime. 30 tablet 2  . omeprazole (PRILOSEC) 40 MG capsule   1  . thyroid (ARMOUR) 30 MG tablet Take 30 mg by mouth daily before breakfast.    . zolpidem (AMBIEN) 5 MG tablet Take 1 tablet (5 mg total) by mouth at bedtime as needed for sleep. 30 tablet 2  . TRINTELLIX 20 MG TABS Take 20 mg by mouth daily. 30 tablet 2   No current facility-administered medications for this visit.    Medical Decision Making:  Established Problem, Stable/Improving (1) A she has been reporting good and stable mood. She does have a follow-up with her [place] treatment team in approximately 6 months. However she states she feels good on her current regimen of Bryn Telex and lorazepam.  Treatment Plan Summary:Medication management Continue the patient on Trintellix 20 mg daily, lorazepam 1 mg Qhs prn. Via the patient with a prescription for Ambien 5 mg daily at bedtime when necessary. Overall patient is doing fairly well but does appear to have had some breakthrough issues with depression, anxiety and irritability.  Major Depressive disorder, recurrent severe without psychotic features. Continue Trintellix 20 mg daily. Asian did comment that she had discontinued therapy when her therapist left this clinic. However she is interested in resuming this. She states this might also help with dealing with some of the breakthrough issues discussed above.  Patient will follow up in 1 month. She's been encouraged colony  questions or concerns prior to her next appointment.   [name] 01/17/2015, 2:42 PM

## 2015-02-04 DIAGNOSIS — E039 Hypothyroidism, unspecified: Secondary | ICD-10-CM | POA: Diagnosis not present

## 2015-02-04 DIAGNOSIS — R5383 Other fatigue: Secondary | ICD-10-CM | POA: Diagnosis not present

## 2015-02-04 DIAGNOSIS — K219 Gastro-esophageal reflux disease without esophagitis: Secondary | ICD-10-CM | POA: Diagnosis not present

## 2015-02-04 DIAGNOSIS — G4733 Obstructive sleep apnea (adult) (pediatric): Secondary | ICD-10-CM | POA: Diagnosis not present

## 2015-02-11 ENCOUNTER — Encounter: Payer: Self-pay | Admitting: Psychiatry

## 2015-02-11 ENCOUNTER — Ambulatory Visit (INDEPENDENT_AMBULATORY_CARE_PROVIDER_SITE_OTHER): Payer: Medicare Other | Admitting: Psychiatry

## 2015-02-11 VITALS — BP 122/86 | HR 88 | Temp 97.9°F | Ht 62.0 in | Wt 173.8 lb

## 2015-02-11 DIAGNOSIS — F332 Major depressive disorder, recurrent severe without psychotic features: Secondary | ICD-10-CM

## 2015-02-11 DIAGNOSIS — Z23 Encounter for immunization: Secondary | ICD-10-CM | POA: Diagnosis not present

## 2015-02-11 DIAGNOSIS — E039 Hypothyroidism, unspecified: Secondary | ICD-10-CM | POA: Diagnosis not present

## 2015-02-11 NOTE — Progress Notes (Signed)
BH MD/PA/NP OP Progress Note  02/11/2015 11:04 AM [name]  MRN:  [id number]  Subjective: Patient returns for follow-up for major depressive disorder, recurrent, severe gout psychotic features. Patient indicates she is doing well. She states that she has had follow-up with a sleep medicine doctor who started her on some clonazepam .25 mg at bedtime. Patient states she is not taking the lorazepam anymore. She states this was started to address sleep issues. She describes some tensing up behaviors at night that of been disturbing to her. I asked her what the sleep.had diagnosed her with and she was not sure but states it is a type of sleep disorder. She hopes that this will address her fatigue that has been going on for several months if her sleep issues can be corrected.  She really has not been experiencing depression and feels like she has had an improvement in that area. Chief Complaint: we are good Chief Complaint    Follow-up; Medication Refill     Visit Diagnosis:     ICD-9-CM ICD-10-CM   1. Major depressive disorder, recurrent, severe without psychotic features ([name]) 296.33 F33.2     Past Medical History:  Past Medical History  Diagnosis Date  . Hypothyroidism   . GERD (gastroesophageal reflux disease)   . Sleep apnea   . Major depressive disorder, recurrent episode, mild (HCC)     Past Surgical History  Procedure Laterality Date  . No past surgeries    . Abdominal hysterectomy    . Cholecystectomy     Family History:  Family History  Problem Relation Age of Onset  . Bipolar disorder Neg Hx   . Depression Brother   . Alcohol abuse Brother   . Stroke Mother   . Neuropathy Mother   . Suicidality Brother    Social History:  Social History   Social History  . Marital Status: Married    Spouse Name: N/A  . Number of Children: N/A  . Years of Education: N/A   Social History Main Topics  . Smoking status: Never Smoker   . Smokeless tobacco: Never Used  .  Alcohol Use: 6.6 oz/week    0 Standard drinks or equivalent, 4 Glasses of wine, 1 Cans of beer, 6 Shots of liquor per week     Comment: 1 drink per night  . Drug Use: No  . Sexual Activity: Yes    Birth Control/ Protection: None   Other Topics Concern  . None   Social History Narrative   Additional History:   Assessment:   Musculoskeletal: Strength & Muscle Tone: within normal limits Gait & Station: normal Patient leans: N/A  Psychiatric Specialty Exam: HPI  Review of Systems  Psychiatric/Behavioral: Negative for depression, suicidal ideas, hallucinations, memory loss and substance abuse. The patient has insomnia (feels that her sleep issues have been a little better since taking the Klonopin). The patient is not nervous/anxious.     Blood pressure 122/86, pulse 88, temperature 97.9 F (36.6 C), temperature source Tympanic, height 5\' 2"  (1.575 m), weight 173 lb 12.8 oz (78.835 kg), SpO2 99 %.Body mass index is 31.78 kg/(m^2).  General Appearance: Well Groomed  Eye Contact:  Good  Speech:  Clear and Coherent and Normal Rate  Volume:  Normal  Mood:  irritable  Affect:  Constricted  Thought Process:  Linear and Logical  Orientation:  Full (Time, Place, and Person)  Thought Content:  Negative  Suicidal Thoughts:  No  Homicidal Thoughts:  No  Memory:  Immediate;  Good Recent;   Good Remote;   Good  Judgement:  Good  Insight:  Good  Psychomotor Activity:  Negative  Concentration:  Good  Recall:  Good  Fund of Knowledge: Good  Language: Good  Akathisia:  Negative  Handed:  Right unknown  AIMS (if indicated):  Not done  Assets:  Communication Skills Desire for Improvement Social Support  ADL's:  Intact  Cognition: WNL  Sleep:  hypersomnia   Is the patient at risk to self?  No. Has the patient been a risk to self in the past 6 months?  No. Has the patient been a risk to self within the distant past?  No. Is the patient a risk to others?  No. Has the patient been  a risk to others in the past 6 months?  No. Has the patient been a risk to others within the distant past?  No.  Current Medications: Current Outpatient Prescriptions  Medication Sig Dispense Refill  . estradiol (ESTRACE) 0.5 MG tablet Take 1 tablet (0.5 mg total) by mouth daily. 30 tablet 0  . levothyroxine (SYNTHROID, LEVOTHROID) 88 MCG tablet   2  . omeprazole (PRILOSEC) 40 MG capsule   1  . thyroid (ARMOUR) 30 MG tablet Take 30 mg by mouth daily before breakfast.    . TRINTELLIX 20 MG TABS Take 20 mg by mouth daily. 30 tablet 2  . zolpidem (AMBIEN) 5 MG tablet Take 1 tablet (5 mg total) by mouth at bedtime as needed for sleep. 30 tablet 2  . LORazepam (ATIVAN) 1 MG tablet Take 1 tablet (1 mg total) by mouth at bedtime. (Patient not taking: Reported on 02/11/2015) 30 tablet 2   No current facility-administered medications for this visit.    Medical Decision Making:  Established Problem, Stable/Improving (1) A she has been reporting good and stable mood. She does have a follow-up with her [place] treatment team in approximately 6 months. However she states she feels good on her current regimen of Bryn Telex and lorazepam.  Treatment Plan Summary:Medication management Continue the patient on Trintellix 20 mg daily, lorazepam 1 mg Qhs prn. Via the patient with a prescription for Ambien 5 mg daily at bedtime when necessary. Overall patient is doing fairly well but does appear to have had some breakthrough issues with depression, anxiety and irritability.  Major Depressive disorder, recurrent severe without psychotic features. Continue Trintellix 20 mg daily.   Insomnia-patient is seen a sleep doctor that his written her for clonazepam 0.25 mg at bedtime. She is no longer taking her lorazepam. She will follow with the sleep medicine doctor in 1 month. She continues on the Ambien but states she is only taking one half of a 5 mg tablet.  Patient will follow up in 1 month. She's been encouraged  colony questions or concerns prior to her next appointment.   [name] 02/11/2015, 11:04 AM

## 2015-03-14 DIAGNOSIS — G2581 Restless legs syndrome: Secondary | ICD-10-CM | POA: Diagnosis not present

## 2015-03-14 DIAGNOSIS — G4733 Obstructive sleep apnea (adult) (pediatric): Secondary | ICD-10-CM | POA: Diagnosis not present

## 2015-03-21 ENCOUNTER — Encounter: Payer: Self-pay | Admitting: Psychiatry

## 2015-03-21 ENCOUNTER — Ambulatory Visit (INDEPENDENT_AMBULATORY_CARE_PROVIDER_SITE_OTHER): Payer: Medicare Other | Admitting: Psychiatry

## 2015-03-21 VITALS — BP 122/80 | HR 84 | Temp 98.1°F | Ht 62.0 in | Wt 175.2 lb

## 2015-03-21 DIAGNOSIS — F332 Major depressive disorder, recurrent severe without psychotic features: Secondary | ICD-10-CM

## 2015-03-21 MED ORDER — TRINTELLIX 20 MG PO TABS: 20.0000 mg | ORAL_TABLET | Freq: Every day | ORAL | Status: DC

## 2015-03-21 MED ORDER — ZOLPIDEM TARTRATE 5 MG PO TABS: 5.0000 mg | ORAL_TABLET | Freq: Every evening | ORAL | Status: DC | PRN

## 2015-03-21 NOTE — Progress Notes (Signed)
BH MD/PA/NP OP Progress Note  03/21/2015 10:58 AM [name]  MRN:  [id number]  Subjective: Patient returns for follow-up for major depressive disorder, recurrent, severe gout psychotic features. She states right now her biggest issue is anxiety. She states that a lot of things are going on right now. She states that their house is on the market, she is flying out for a funeral for her last remaining sibling and they will be doing some traveling over the holidays.  Despite the issues she states that she is aware it's probably due to the multiple things going on is causing her anxiety. She states her depression she feels is under reasonable control. She stated that she's had follow-up with the sleep doctor who is can continue her on the Klonopin.  States for sleep she continues to take a half of a 5 mg Ambien. She continues on chin Telex and is finding that to be of good benefit for her mood. In regards to depression she states things have not gotten worse or gotten any better.   Chief Complaint: Anxious Chief Complaint    Follow-up; Medication Refill     Visit Diagnosis:   No diagnosis found.  Past Medical History:  Past Medical History  Diagnosis Date  . Hypothyroidism   . GERD (gastroesophageal reflux disease)   . Sleep apnea   . Major depressive disorder, recurrent episode, mild (HCC)     Past Surgical History  Procedure Laterality Date  . No past surgeries    . Abdominal hysterectomy    . Cholecystectomy     Family History:  Family History  Problem Relation Age of Onset  . Bipolar disorder Neg Hx   . Depression Brother   . Alcohol abuse Brother   . Stroke Mother   . Neuropathy Mother   . Suicidality Brother    Social History:  Social History   Social History  . Marital Status: Married    Spouse Name: N/A  . Number of Children: N/A  . Years of Education: N/A   Social History Main Topics  . Smoking status: Never Smoker   . Smokeless tobacco: Never Used   . Alcohol Use: 6.6 oz/week    0 Standard drinks or equivalent, 4 Glasses of wine, 1 Cans of beer, 6 Shots of liquor per week     Comment: 1 drink per night  . Drug Use: No  . Sexual Activity: Yes    Birth Control/ Protection: None   Other Topics Concern  . None   Social History Narrative   Additional History:   Assessment:   Musculoskeletal: Strength & Muscle Tone: within normal limits Gait & Station: normal Patient leans: N/A  Psychiatric Specialty Exam: HPI  Review of Systems  Psychiatric/Behavioral: Negative for depression, suicidal ideas, hallucinations, memory loss and substance abuse. The patient has insomnia (feels that her sleep issues have been a little better since taking the Klonopin). The patient is not nervous/anxious.     Blood pressure 122/80, pulse 84, temperature 98.1 F (36.7 C), temperature source Tympanic, height 5\' 2"  (1.575 m), weight 175 lb 3.2 oz (79.47 kg), SpO2 97 %.Body mass index is 32.04 kg/(m^2).  General Appearance: Well Groomed  Eye Contact:  Good  Speech:  Clear and Coherent and Normal Rate  Volume:  Normal  Mood:  Anxious  Affect:  mood congruent but able to smile  Thought Process:  Linear and Logical  Orientation:  Full (Time, Place, and Person)  Thought Content:  Negative  Suicidal  Thoughts:  No  Homicidal Thoughts:  No  Memory:  Immediate;   Good Recent;   Good Remote;   Good  Judgement:  Good  Insight:  Good  Psychomotor Activity:  Negative  Concentration:  Good  Recall:  Good  Fund of Knowledge: Good  Language: Good  Akathisia:  Negative  Handed:  Right unknown  AIMS (if indicated):  Not done  Assets:  Communication Skills Desire for Improvement Social Support  ADL's:  Intact  Cognition: WNL  Sleep:  hypersomnia   Is the patient at risk to self?  No. Has the patient been a risk to self in the past 6 months?  No. Has the patient been a risk to self within the distant past?  No. Is the patient a risk to others?   No. Has the patient been a risk to others in the past 6 months?  No. Has the patient been a risk to others within the distant past?  No.  Current Medications: Current Outpatient Prescriptions  Medication Sig Dispense Refill  . clonazePAM (KLONOPIN) 0.5 MG tablet Take 0.5 mg by mouth 2 (two) times daily as needed for anxiety.    Marland Kitchen estradiol (ESTRACE) 0.5 MG tablet Take 1 tablet (0.5 mg total) by mouth daily. 30 tablet 0  . levothyroxine (SYNTHROID, LEVOTHROID) 88 MCG tablet   2  . omeprazole (PRILOSEC) 40 MG capsule   1  . thyroid (ARMOUR) 30 MG tablet Take 30 mg by mouth daily before breakfast.    . TRINTELLIX 20 MG TABS Take 20 mg by mouth daily. 30 tablet 2  . zolpidem (AMBIEN) 5 MG tablet Take 1 tablet (5 mg total) by mouth at bedtime as needed for sleep. 30 tablet 2   No current facility-administered medications for this visit.    Medical Decision Making:  Established Problem, Stable/Improving (1) A she has been reporting good and stable mood. She does have a follow-up with her [place] treatment team in approximately 6 months. However she states she feels good on her current regimen of Bryn Telex and lorazepam.  Treatment Plan Summary:Medication management Continue the patient on Trintellix 20 mg daily, lorazepam 1 mg Qhs prn. Via the patient with a prescription for Ambien 5 mg daily at bedtime when necessary. Overall patient is doing fairly well but does appear to have had some breakthrough issues with depression, anxiety and irritability.  Major Depressive disorder, recurrent severe without psychotic features. Continue Trintellix 20 mg daily.   Insomnia-patient is seen a sleep doctor that his written her for clonazepam 0.25 mg at bedtime. . She continues on the Ambien but states she is only taking one half of a 5 mg tablet.  Patient will follow up in 1 month. She's been encouraged colony questions or concerns prior to her next appointment.   [name] 03/21/2015, 10:58 AM

## 2015-05-22 ENCOUNTER — Ambulatory Visit (INDEPENDENT_AMBULATORY_CARE_PROVIDER_SITE_OTHER): Payer: Medicare Other | Admitting: Psychiatry

## 2015-05-22 ENCOUNTER — Encounter: Payer: Self-pay | Admitting: Psychiatry

## 2015-05-22 VITALS — BP 110/80 | HR 81 | Temp 97.9°F | Ht 62.0 in | Wt 175.2 lb

## 2015-05-22 DIAGNOSIS — F332 Major depressive disorder, recurrent severe without psychotic features: Secondary | ICD-10-CM

## 2015-05-22 MED ORDER — LORAZEPAM 1 MG PO TABS: 1.0000 mg | ORAL_TABLET | Freq: Every evening | ORAL | Status: DC | PRN

## 2015-05-22 MED ORDER — TRINTELLIX 20 MG PO TABS: 20.0000 mg | ORAL_TABLET | Freq: Every day | ORAL | Status: DC

## 2015-05-22 MED ORDER — ZOLPIDEM TARTRATE 5 MG PO TABS: 5.0000 mg | ORAL_TABLET | Freq: Every evening | ORAL | Status: DC | PRN

## 2015-05-22 NOTE — Progress Notes (Signed)
BH MD/PA/NP OP Progress Note  05/22/2015 11:04 AM [name]  MRN:  [id number]  Subjective: Patient returns for follow-up for major depressive disorder, recurrent, severe without psychotic features. She states overall she is doing very well. She states she was worried she was in a do well with the travel that she was taking to a funeral and then spending 6 weeks in [place] with her son and grandchildren. She states her mood was pretty good. She states she was worried about perhaps being out there and not interacting and not getting up. She states overall she was able to get up and do things. She states they got back about one week ago and things have continued to go well.  He feels like the medications are working. She states that she continues to only use one half of a 5 mg Ambien as needed. She states that she is largely taking the clonazepam from her sleep medicine doctor. She states she does not use the lorazepam on a regular basis but does like to have some available.   Chief Complaint: Anxious Chief Complaint    Follow-up; Medication Refill     Visit Diagnosis:     ICD-9-CM ICD-10-CM   1. Major depressive disorder, recurrent, severe without psychotic features ([facility]) 296.33 F33.2     Past Medical History:  Past Medical History  Diagnosis Date  . Hypothyroidism   . GERD (gastroesophageal reflux disease)   . Sleep apnea   . Major depressive disorder, recurrent episode, mild (HCC)     Past Surgical History  Procedure Laterality Date  . No past surgeries    . Abdominal hysterectomy    . Cholecystectomy     Family History:  Family History  Problem Relation Age of Onset  . Bipolar disorder Neg Hx   . Depression Brother   . Alcohol abuse Brother   . Stroke Mother   . Neuropathy Mother   . Suicidality Brother    Social History:  Social History   Social History  . Marital Status: Married    Spouse Name: N/A  . Number of Children: N/A  . Years of Education: N/A    Social History Main Topics  . Smoking status: Never Smoker   . Smokeless tobacco: Never Used  . Alcohol Use: 6.6 oz/week    0 Standard drinks or equivalent, 4 Glasses of wine, 1 Cans of beer, 6 Shots of liquor per week     Comment: 1 drink per night  . Drug Use: No  . Sexual Activity: Yes    Birth Control/ Protection: None   Other Topics Concern  . None   Social History Narrative   Additional History:   Assessment:   Musculoskeletal: Strength & Muscle Tone: within normal limits Gait & Station: normal Patient leans: N/A  Psychiatric Specialty Exam: HPI  Review of Systems  Psychiatric/Behavioral: Negative for depression, suicidal ideas, hallucinations, memory loss and substance abuse. The patient has insomnia (feels that her sleep issues have been a little better since taking the Klonopin). The patient is not nervous/anxious.     Blood pressure 110/80, pulse 81, temperature 97.9 F (36.6 C), temperature source Tympanic, height 5\' 2"  (1.575 m), weight 175 lb 3.2 oz (79.47 kg), SpO2 93 %.Body mass index is 32.04 kg/(m^2).  General Appearance: Well Groomed  Eye Contact:  Good  Speech:  Clear and Coherent and Normal Rate  Volume:  Normal  Mood:  Good  Affect:  Congruent, able to smile when discussing the visit with  her grandchildren   Thought Process:  Linear and Logical  Orientation:  Full (Time, Place, and Person)  Thought Content:  Negative  Suicidal Thoughts:  No  Homicidal Thoughts:  No  Memory:  Immediate;   Good Recent;   Good Remote;   Good  Judgement:  Good  Insight:  Good  Psychomotor Activity:  Negative  Concentration:  Good  Recall:  Good  Fund of Knowledge: Good  Language: Good  Akathisia:  Negative  Handed:  Right unknown  AIMS (if indicated):  Not done  Assets:  Communication Skills Desire for Improvement Social Support  ADL's:  Intact  Cognition: WNL  Sleep:  hypersomnia   Is the patient at risk to self?  No. Has the patient been a risk to  self in the past 6 months?  No. Has the patient been a risk to self within the distant past?  No. Is the patient a risk to others?  No. Has the patient been a risk to others in the past 6 months?  No. Has the patient been a risk to others within the distant past?  No.  Current Medications: Current Outpatient Prescriptions  Medication Sig Dispense Refill  . clonazePAM (KLONOPIN) 0.5 MG tablet Take 0.5 mg by mouth 2 (two) times daily as needed for anxiety.    Marland Kitchen estradiol (ESTRACE) 0.5 MG tablet Take 1 tablet (0.5 mg total) by mouth daily. 30 tablet 0  . levothyroxine (SYNTHROID, LEVOTHROID) 88 MCG tablet   2  . omeprazole (PRILOSEC) 40 MG capsule   1  . thyroid (ARMOUR) 30 MG tablet Take 30 mg by mouth daily before breakfast.    . TRINTELLIX 20 MG TABS Take 20 mg by mouth daily. 30 tablet 4  . zolpidem (AMBIEN) 5 MG tablet Take 1 tablet (5 mg total) by mouth at bedtime as needed for sleep. 30 tablet 4  . LORazepam (ATIVAN) 1 MG tablet Take 1 tablet (1 mg total) by mouth at bedtime as needed for anxiety. 30 tablet 4   No current facility-administered medications for this visit.    Medical Decision Making:  Established Problem, Stable/Improving (1) A she has been reporting good and stable mood. She does have a follow-up with her [place] treatment team in approximately 6 months. However she states she feels good on her current regimen of Bryn Telex and lorazepam.  Treatment Plan Summary:Medication management Continue the patient on Trintellix 20 mg daily, lorazepam 1 mg Qhs prn. He indicates she is not using the lorazepam as she is prescribed clonazepam by sleep medicine physician.   Major Depressive disorder, recurrent severe without psychotic features. Continue Trintellix 20 mg daily.   Insomnia-patient is seen a sleep doctor that his written her for clonazepam 0.25 mg at bedtime.  She continues on the Ambien but states she is only taking one half of a 5 mg tablet.  Patient will follow up in  a month if needed. Patient requests a referral to another psychiatrist given the issues with transitions in this clinic. She has been referred to Dr. Nicolasa Ducking. However she's been told that there is any issues with the referral that she can contact this clinic for further care.   [name] 05/22/2015, 11:04 AM

## 2015-05-22 NOTE — Patient Instructions (Signed)
PATIENT REQUEST REFERRAL TO DR. KAPUR.

## 2015-05-29 DIAGNOSIS — G4733 Obstructive sleep apnea (adult) (pediatric): Secondary | ICD-10-CM | POA: Diagnosis not present

## 2015-06-13 DIAGNOSIS — F3341 Major depressive disorder, recurrent, in partial remission: Secondary | ICD-10-CM | POA: Diagnosis not present

## 2015-06-13 DIAGNOSIS — E889 Metabolic disorder, unspecified: Secondary | ICD-10-CM | POA: Diagnosis not present

## 2015-06-13 DIAGNOSIS — G4733 Obstructive sleep apnea (adult) (pediatric): Secondary | ICD-10-CM | POA: Diagnosis not present

## 2015-06-13 DIAGNOSIS — E039 Hypothyroidism, unspecified: Secondary | ICD-10-CM | POA: Diagnosis not present

## 2015-06-20 DIAGNOSIS — E039 Hypothyroidism, unspecified: Secondary | ICD-10-CM | POA: Diagnosis not present

## 2015-07-02 ENCOUNTER — Other Ambulatory Visit: Payer: Self-pay | Admitting: Nurse Practitioner

## 2015-07-02 ENCOUNTER — Ambulatory Visit
Admission: RE | Admit: 2015-07-02 | Discharge: 2015-07-02 | Disposition: A | Discharge status: Home / Self Care | Payer: Medicare Other | Source: Ambulatory Visit | Attending: Nurse Practitioner | Admitting: Nurse Practitioner

## 2015-07-02 DIAGNOSIS — R531 Weakness: Secondary | ICD-10-CM | POA: Diagnosis not present

## 2015-07-02 DIAGNOSIS — J309 Allergic rhinitis, unspecified: Secondary | ICD-10-CM | POA: Diagnosis not present

## 2015-07-02 DIAGNOSIS — M25562 Pain in left knee: Secondary | ICD-10-CM | POA: Diagnosis not present

## 2015-07-02 DIAGNOSIS — R262 Difficulty in walking, not elsewhere classified: Secondary | ICD-10-CM

## 2015-07-02 DIAGNOSIS — F3341 Major depressive disorder, recurrent, in partial remission: Secondary | ICD-10-CM | POA: Diagnosis not present

## 2015-07-02 DIAGNOSIS — E039 Hypothyroidism, unspecified: Secondary | ICD-10-CM | POA: Diagnosis not present

## 2015-07-02 DIAGNOSIS — F411 Generalized anxiety disorder: Secondary | ICD-10-CM | POA: Diagnosis not present

## 2015-07-05 DIAGNOSIS — H2511 Age-related nuclear cataract, right eye: Secondary | ICD-10-CM | POA: Diagnosis not present

## 2015-07-09 ENCOUNTER — Ambulatory Visit (INDEPENDENT_AMBULATORY_CARE_PROVIDER_SITE_OTHER): Payer: Medicare Other | Admitting: Psychiatry

## 2015-07-09 ENCOUNTER — Encounter (HOSPITAL_COMMUNITY): Payer: Self-pay | Admitting: Psychiatry

## 2015-07-09 VITALS — BP 118/68 | HR 92 | Ht 62.0 in | Wt 171.8 lb

## 2015-07-09 DIAGNOSIS — F331 Major depressive disorder, recurrent, moderate: Secondary | ICD-10-CM | POA: Diagnosis not present

## 2015-07-09 MED ORDER — LAMOTRIGINE 25 MG PO TABS: ORAL_TABLET | ORAL | Status: DC

## 2015-07-09 NOTE — Progress Notes (Signed)
[facility] Initial Assessment Note  [name] [id number] 70 y.o.  07/09/2015 11:48 AM  Chief Complaint:  I need a new psychiatrist.  My psychiatrist is leaving.  I have long history of depression.  I don't think my medicine is working as good.  History of Present Illness:  Patient is 70 year old Caucasian, retired, married female who is referred from Dr. Gwyndolyn Garner for the management of depression.  Patient is seeing Dr. Arsenio Garner for past few months and she is taking Trintellex 20 mg.  Patient mentioned her physician is leaving and she liked to find a new psychiatrist.  She is currently taking Klonopin 0.5 mg half tablet at bedtime.  She is also prescribed Ambien and lorazepam but she does not take these medication on a regular basis.  Patient endorse history of depression since age 35.  She remember taking multiple psychiatric medication but either it stopped working or does not help them.  She's not sure about previous medication trial and remembered taking Prozac, Wellbutrin, Concerta, lithium, Effexor, Lexapro and Abilify.  She endorsed most of the symptoms of depression are lack of energy, motivation, crying spells, anhedonia, feeling hopeless and helpless.  She endorse decreased energy and decreased socialization.  She stays most of the time to herself and does not leave her bed until it is something important.  She lives with her husband was very supportive.  Last year she had ECT and [place].  She did not like ECT after a treatment and she was recommended DMS.  She did better [place] for some time but then she is feeling her medicines are not working as good.  Though she denies any active suicidal thoughts but endorsed irritability, social withdrawal, fatigue, crying spells, lack of appetite and sometimes easily emotional.  She unable to mention her stressors contributing to depression but endorsed lack of responsibilities and boredom may be contributing to her depression.  She used to  enjoy volunteer work, exercise and yoga but has not done in past 3 years.  She endorse chronic sadness, discouragement, low self esteem, and indecisiveness.  Patient has sleep apnea and restless leg.  Patient denies any paranoia, hallucination, aggressive behavior, impulsive behavior, OCD, PTSD symptoms.  She denies any panic attacks.  She denies drinking or using any illegal substances.  She lives with her husband who is very supportive.  Patient recently had a 6 week trip to [place] and she really enjoyed seeing her grandkids.  Patient currently not seeing any therapist.  Suicidal Ideation: No Plan Formed: No Patient has means to carry out plan: No  Homicidal Ideation: No Plan Formed: No Patient has means to carry out plan: No  Past Psychiatric History/Hospitalization(s): Patient endorse history of depression since age 83.  She had suicidal attempt at age 53 when she took overdose on medication.  Patient has at least 2 psychiatric hospitalization.  Patient endorse history of anger issues and irritability.  She denies any history of mania but endorsed history of impulsive behavior.  She remember taking Concerta, Wellbutrin, Prozac, lithium, Effexor, Lexapro, Abilify and Cymbalta.  She remember the best treatment when she had given a female hormone to help endometriosis.  In 2016 she has history of ECT treatment which was failed and then she did [place] which helped her.  She remember Abilify lithium and Wellbutrin did not work.  Patient denies any history of psychosis, hallucination, nightmares or flashback.  She denies any history of physical sexual verbal abuse. Anxiety: No Bipolar Disorder: No Depression: Yes Mania: No  Psychosis: No Schizophrenia: No Personality Disorder: No Hospitalization for psychiatric illness: Yes History of Electroconvulsive Shock Therapy: Yes Prior Suicide Attempts: Yes  Family History; Patient endorse a brother committed suicide .  She mentioned he has a history of  alcoholism.    Medical History; Patient see Dr. Tiburcio Garner in [place].  She has hypothyroidism, sleep apnea and restless leg   Traumatic brain injury: Patient denies any history of traumatic brain injury.    Education and Work History; Patient has college education.  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    Psychosocial History; Patient born in [place].  She grew up there.  She has been married since 1969 .  Her husband is very supportive.  She has 3 boys .  Her son lives in [place], [place] and [place].  She has 5 grandkids.    Legal History; Patient denies any legal issues.    History Of Abuse; Patient denies any history of abuse.    Substance Abuse History; Patient denies any history of drinking or using any illegal substance use.    Review of Systems: Psychiatric: Agitation: No Hallucination: No Depressed Mood: Yes Insomnia: Yes Hypersomnia: No Altered Concentration: No Feels Worthless: Yes Grandiose Ideas: No Belief In Special Powers: No New/Increased Substance Abuse: No Compulsions: No  Neurologic: Headache: No Seizure: No Paresthesias: No   Outpatient Encounter Prescriptions as of 07/09/2015  Medication Sig  . clonazePAM (KLONOPIN) 0.5 MG tablet Take 0.5 mg by mouth 2 (two) times daily as needed for anxiety.  Marland Kitchen estradiol (ESTRACE) 0.5 MG tablet Take 1 tablet (0.5 mg total) by mouth daily.  Marland Kitchen lamoTRIgine (LAMICTAL) 25 MG tablet Take 1 tab daily for 1 wek and than 2 tab daily  . levothyroxine (SYNTHROID, LEVOTHROID) 88 MCG tablet   . omeprazole (PRILOSEC) 40 MG capsule   . thyroid (ARMOUR) 30 MG tablet Take 30 mg by mouth daily before breakfast.  . TRINTELLIX 20 MG TABS Take 20 mg by mouth daily.  . [DISCONTINUED] LORazepam (ATIVAN) 1 MG tablet Take 1 tablet (1 mg total) by mouth at bedtime as needed for anxiety.  . [DISCONTINUED] zolpidem (AMBIEN) 5 MG tablet Take 1 tablet  (5 mg total) by mouth at bedtime as needed for sleep.   No facility-administered encounter medications on file as of 07/09/2015.    No results found for this or any previous visit (from the past 2160 hour(s)).    Constitutional:  BP 118/68 mmHg  Pulse 92  Ht 5\' 2"  (1.575 m)  Wt 171 lb 12.8 oz (77.928 kg)  BMI 31.41 kg/m2  LMP  (LMP Unknown)   Musculoskeletal: Strength & Muscle Tone: within normal limits Gait & Station: normal Patient leans: N/A  Psychiatric Specialty Exam: General Appearance: Casual  Eye Contact::  Fair  Speech:  Slow  Volume:  Decreased  Mood:  Depressed and Dysphoric  Affect:  Constricted and Depressed  Thought Process:  Intact  Orientation:  Full (Time, Place, and Person)  Thought Content:  WDL and Rumination  Suicidal Thoughts:  No  Homicidal Thoughts:  No  Memory:  Immediate;   Fair Recent;   Fair Remote;   Fair  Judgement:  Fair  Insight:  Fair  Psychomotor Activity:  Normal  Concentration:  Fair  Recall:  Good  Fund of Knowledge:  Fair  Language:  Fair  Akathisia:  No  Handed:  Right  AIMS (if indicated):  Assets:  Communication Skills Desire for Improvement Financial Resources/Insurance [place] [employer]ents/Skills Transportation  ADL's:  Intact  Cognition:  WNL  Sleep:        New problem, with additional work up planned, Review of Psycho-Social Stressors (1), Review or order clinical lab tests (1), Decision to obtain old records (1), Review and summation of old records (2), Established Problem, Worsening (2), New Problem, with no additional work-up planned (3), Review of Medication Regimen & Side Effects (2) and Review of New Medication or Change in Dosage (2)  Assessment: Axis I: Major depressive disorder, recurrent moderate  Axis II: Deferred  Axis III:  Past Medical History  Diagnosis Date  . Hypothyroidism   . GERD (gastroesophageal reflux disease)   . Sleep apnea   . Major depressive  disorder, recurrent episode, mild (HCC)      Plan:  I review her symptoms, history, collateral information, current medication and psychosocial stressors.  She is taking Trintellex 20 mg daily.  I recommended to add a mood stabilizer to help irritability depression.  I also believe patient should restart her volunteer work as one of her depression is contributed by boredom and lack of activities.  She used to do volunteer work at [employer].  Patient agreed to restart volunteer work.  I recommended to discontinue lorazepam and Ambien and continue Klonopin 0.5 mg at bedtime and half tablet as needed for anxiety.  We will schedule appointment with Tharon Aquas in this office for counseling.  We will get records from her primary care physician Dr. Humphrey Rolls.  Recommended to call us back if she has any question or any concern.  Follow-up in 3 weeks.  Discuss safety plan that anytime having active suicidal thoughts or homicidal thoughts and she need to call 911 or go to the local emergency room.  Ellison Leisure T., MD 07/09/2015

## 2015-07-17 DIAGNOSIS — M76892 Other specified enthesopathies of left lower limb, excluding foot: Secondary | ICD-10-CM | POA: Diagnosis not present

## 2015-07-23 DIAGNOSIS — Z1231 Encounter for screening mammogram for malignant neoplasm of breast: Secondary | ICD-10-CM | POA: Diagnosis not present

## 2015-07-29 ENCOUNTER — Ambulatory Visit: Payer: Medicare Other | Attending: Student

## 2015-07-29 ENCOUNTER — Encounter: Payer: Self-pay | Admitting: Physical Therapy

## 2015-07-29 DIAGNOSIS — R269 Unspecified abnormalities of gait and mobility: Secondary | ICD-10-CM | POA: Diagnosis not present

## 2015-07-29 DIAGNOSIS — M25562 Pain in left knee: Secondary | ICD-10-CM

## 2015-07-29 NOTE — Patient Instructions (Signed)
Stretching: Piriformis    Cross left leg over other thigh and place elbow over outside of knee. Gently stretch buttock muscles by pushing bent knee across body. Hold _30___ seconds. Repeat _3___ times per session. Do _3___ sessions per day.   KNEE: Quadriceps - Prone    Place strap around ankle. Bring ankle toward buttocks. Press hip into surface. Hold _30__ seconds. Repeat _3___ times per session. Do _3___ sessions per day.  FUNCTIONAL MOBILITY: Wall Squat    Stance: shoulder-width on floor, against wall. Place feet in front of hips. Bend hips and knees. Keep back straight. Do not allow knees to bend past toes. Hold for 10-15 seconds. Squeeze glutes and quads to stand. Rest 15 seconds then repeat 4 more times.  2 sets per day.

## 2015-07-30 NOTE — Therapy (Signed)
[facility] [address] [place], [address] Phone: [phone]   Fax:  [phone]  Physical Therapy Evaluation  Patient Details  Name: [name] MRN: [id number] Date of Birth: May 23, 1964 Referring Provider: Cameron Proud  Encounter Date: 07/29/2015      PT End of Session - 07/30/15 1407    Visit Number 1   Number of Visits 13   Date for PT Re-Evaluation Sep 24, 2015   Authorization Type g codes every 10th visit/30 days   PT Start Time 1015   PT Stop Time 1115   PT Time Calculation (min) 60 min   Activity Tolerance Patient tolerated treatment well      Past Medical History  Diagnosis Date  . Hypothyroidism   . GERD (gastroesophageal reflux disease)   . Sleep apnea   . Major depressive disorder, recurrent episode, mild (HCC)     Past Surgical History  Procedure Laterality Date  . No past surgeries    . Abdominal hysterectomy    . Cholecystectomy      There were no vitals filed for this visit.  Visit Diagnosis:  Pain in left knee - Plan: PT plan of care cert/re-cert  Abnormality of gait - Plan: PT plan of care cert/re-cert      Subjective Assessment - 07/29/15 1244    Subjective L knee pain   Pertinent History Pt reports she worked with a Clinical research associate about 68 weeks ago. She did not feel like she "over did it" however the following day she felt like her L knee wanted to "give out on her." This occurs while she is walking and pt does not know of any aggravating factors. Pt denies any trauma or popping during the exercise. Pt reports the pain is "right at the top of the knee cap." She describes the pain as sharp when the knee would give out on her. She has a knee brace that she wears that tends to help with the pain. Pt reports that pain is gradually improving. She has not had pain in the last few days and the knee has not given out of her in the last couple days. Pt states she is anxious that it may  happen again. She is also complaining of some muscle soreness/pain on the lateral aspect of the knee. She walks around a lot barefooted while at home. No radiating symptoms. Pt reports initial swelling in L knee but has since resolved. No bruising or warmth. Pt reports history of R side hip pain with walking on hard surfaces. Intermittent "tightness" in low back but it occurs rarely and does not coincide with L knee weakness/pain. Denies numbness/tingling. Denies catching, popping, or clicking in L knee.    Patient Stated Goals Decrease pain with all activities at home   Currently in Pain? No/denies  Worst: 1/10, Present/Best: 0/10            [facility] PT Assessment - 07/29/15 1010    Assessment   Medical Diagnosis L quadriceps tendonitis   Referring Provider Cameron Proud   Next MD Visit Not reported   Prior Therapy Not for current condition   Precautions   Precautions None   Restrictions   Weight Bearing Restrictions No   Balance Screen   Has the patient fallen in the past 6 months No   Has the patient had a decrease in activity level because of a fear of falling?  Yes   Is the patient reluctant to leave their  home because of a fear of falling?  No   Home Ec[place] residence   Living Arrangements Spouse/significant other   Available Help at Discharge Family   Type of [place] to enter   Entrance Stairs-Number of Steps 8   Entrance Stairs-Rails Left   Home Layout Two level   Alternate Level Stairs-Number of Steps 12   Alternate Level Stairs-Rails Right;Left   Prior Function   Level of Independence Independent   Vocation Retired   Leisure Walking   Cognition   Overall Cognitive Status Within Functional Limits for tasks assessed   Observation/Other Assessments   Other Surveys  Other Surveys   Lower Extremity Functional Scale  50/80  0s for all running and hopping   Sensation   Light Touch Appears Intact  L2-S2 dermatomes  intact to light touch   Coordination   Gross Motor Movements are Fluid and Coordinated Yes   Functional Tests   Functional tests Step down   Step Down   Comments No gross valgus noted with step down test   Posture/Postural Control   Posture Comments Pt overweight but otherwise not gross abnormalities in posture noted. Knee caps appears symmetrical without any significant femoral rotation noted in standing posture   Tone   Assessment Location Other (comment)   Tone Assessment - Other   Other Tone Location Comments No abnormla tone noted   ROM / Strength   AROM / PROM / Strength Strength;AROM   AROM   Overall AROM  Within functional limits for tasks performed   Overall AROM Comments Bilateral knee AAROM is 0-134 degrees and painless.    Strength   Overall Strength Within functional limits for tasks performed   Overall Strength Comments Ankle DF/PF fully intact   Strength Assessment Site Hip;Knee   Right/Left Hip Right;Left   Right Hip Flexion 4+/5   Right Hip Extension 4+/5   Right Hip External Rotation  4+/5   Right Hip Internal Rotation 4+/5   Right Hip ABduction 4/5   Right Hip ADduction 4+/5   Left Hip Flexion 4+/5   Left Hip Extension 4+/5   Left Hip External Rotation 4+/5   Left Hip Internal Rotation 4+/5   Left Hip ABduction 4/5   Left Hip ADduction 4+/5   Right/Left Knee Right;Left   Right Knee Flexion 4+/5   Right Knee Extension 5/5   Left Knee Flexion 4+/5   Left Knee Extension 5/5   Flexibility   Soft Tissue Assessment /Muscle Length yes  Positive Obers bilaterally   Quadriceps Mild tightness bilaterally but also difficult to assess due to soft tissue approximation   Palpation   Patella mobility Patellar mobility is grossly symmetrical, full, and painless with the exception of painful inferior patellar mobs on L side   Spinal mobility Negative repeated lumbar flexion/extension for positive reproduction of knee pain; CPA and L UPA are negative for positive  reproduction of knee pain   Palpation comment Pt with pain to palpation on superior aspect of L patella near insertion of quadricep tendon. Painless on R side. No gross tendon defect noted. No bruising, swelling, or warmth noted. Negative grind test. Pt with some clicking with inferior patellar movement   Special Tests    Special Tests Knee Special Tests   Knee Special tests  other   other    Comments Negative ACL and PCL testing. Pt with some mild medical knee laxity noted with valgus force but symmetrical  and painless. Negative meniscus testing.   Ambulation/Gait   Gait Comments Pt with slight loss of end range L knee extension just prior to initial contact with slight decrease in heel strike on L side. Mild pronation noted but not severe. No severe knee valgus or trendelenberg noted       TREATMENT  Pt issued and performed HEP with therapist that includes prone L quad stretching, L piriformis stretch, and wall squat. Completed all exercises performing 3 reps of each; Therapist performed IASTM with "Edge" tool over distal quadricep tendon;                    PT Education - 07/29/15 1247    Education provided Yes   Education Details HEP furnished, plan of care   Person(s) Educated Patient   Methods Explanation;Demonstration;Tactile cues;Verbal cues;Handout   Comprehension Verbalized understanding;Returned demonstration             PT Long Term Goals - 07/30/15 1413    PT LONG TERM GOAL #1   Title Pt will be independent with HEP in order to decrease pain and improve function at home and with volunteer work.   Time 6   Period Weeks   Status New   PT LONG TERM GOAL #2   Title Pt will report decrease in LEFS by at least 9 points in order to see clinically significant improvement in lower extremity function   Baseline 07/29/15: 50/80   Time 6   Period Weeks   Status New   PT LONG TERM GOAL #3   Title Pt will report 0/10 pain in L knee with all aggravating  activities to complete all responsibilities at home and with volunteer work without pain   Baseline 07/29/15: worst 1/10   Time 6   Period Weeks   Status New   PT LONG TERM GOAL #4   Title Pt will demonstrate normalized gait pattern with full L knee extension prior to initial contact (symmetrical to R side) to decrease pain with ambulation and prevent further hip/low back injury   Time 6   Period Weeks   Status New               Plan - 07/30/15 1408    Clinical Impression Statement Pt is a pleasant 70 year-old female who was referred for quadricep tendonitis of L knee. Pt demonstrates full AROM of L knee with negative testing for ligamentous laxity. Mild loss of quadricep length bilaterally. No associated L hip or back pain and strength is full and painless throughout lower extremities. Tender to palpation over distal quadricep tendon near insertion at superior border of patella. Pt issued home exercise program for isometric quad strengthening as well as stretching of quad and hip muscles. Pt will benefit from skilled PT services to address deficits in order to resume full function at home and with volunteer work with less pain   Pt will benefit from skilled therapeutic intervention in order to improve on the following deficits Abnormal gait;Difficulty walking;Pain   Rehab Potential Good   Clinical Impairments Affecting Rehab Potential Positive: motivation, improvement; Negative: anxiety   PT Frequency 2x / week   PT Duration 6 weeks   PT Treatment/Interventions Aquatic Therapy;Electrical Stimulation;Cryotherapy;Iontophoresis 4mg /ml Dexamethasone;Moist Heat;Traction;Ultrasound;DME Instruction;Gait training;Stair training;Functional mobility training;Therapeutic activities;Therapeutic exercise;Balance training;Neuromuscular re-education;Patient/family education;Manual techniques;Dry needling   PT Next Visit Plan Progress isometric and eccentric L quad strengthening, L quad stretching,  manual techniques   PT Home Exercise Plan Prone quad stretch, wall squats,  piriformis stretch   Consulted and Agree with Plan of Care Patient          G-Codes - 08/03/15 1515    Functional Assessment Tool Used clinical judgement, NPRS, LEFS   Functional Limitation Mobility: Walking and moving around   Mobility: Walking and Moving Around Current Status [phone]) At least 1 percent but less than 20 percent impaired, limited or restricted   Mobility: Walking and Moving Around Goal Status [phone]) 0 percent impaired, limited or restricted       Problem List Patient Active Problem List   Diagnosis Date Noted  . MDD (major depressive disorder), recurrent episode, severe ([place]) 06/04/2014   Phillips Grout PT, DPT   Huprich,Jason 08/03/2015, 3:19 PM  [facility] [address], [place], [address] Phone: [phone]   Fax:  [phone]  Name: Kadison Nishimura MRN: [id number] Date of Birth: 27-Jun-1945

## 2015-08-01 ENCOUNTER — Ambulatory Visit: Payer: Medicare Other

## 2015-08-01 ENCOUNTER — Ambulatory Visit (INDEPENDENT_AMBULATORY_CARE_PROVIDER_SITE_OTHER): Payer: Medicare Other | Admitting: Psychiatry

## 2015-08-01 ENCOUNTER — Encounter (HOSPITAL_COMMUNITY): Payer: Self-pay | Admitting: Psychiatry

## 2015-08-01 VITALS — BP 111/69 | HR 89 | Ht 62.0 in | Wt 170.2 lb

## 2015-08-01 DIAGNOSIS — R269 Unspecified abnormalities of gait and mobility: Secondary | ICD-10-CM

## 2015-08-01 DIAGNOSIS — F331 Major depressive disorder, recurrent, moderate: Secondary | ICD-10-CM

## 2015-08-01 DIAGNOSIS — M25562 Pain in left knee: Secondary | ICD-10-CM

## 2015-08-01 MED ORDER — TRINTELLIX 20 MG PO TABS: 20.0000 mg | ORAL_TABLET | Freq: Every day | ORAL | Status: DC

## 2015-08-01 MED ORDER — LAMOTRIGINE 25 MG PO TABS
ORAL_TABLET | ORAL | Status: DC
Start: 2015-08-01 — End: 2015-09-02

## 2015-08-01 MED ORDER — TRAZODONE HCL 50 MG PO TABS: ORAL_TABLET | ORAL | Status: DC

## 2015-08-01 NOTE — Progress Notes (Signed)
[place] [phone] progress Note  [name] [id number] 70 y.o.  08/01/2015 3:58 PM  Chief Complaint:  I tried new medication for few days but I feel very tired and a stop taking it.  I should have taken from oh days.  I'm feeling more depressed now.  Last night I took Ambien because I could not sleep.    History of Present Illness:  [name] came for her follow-up appointment .  She is a 70 year old Caucasian retired married female who is referred from Dr. Gwyndolyn Saxon for treatment of depression.  She has been seeing Dr. Gwyndolyn Saxon for past few months.  We have recommended to try Klonopin and a stop Ativan and Ambien.  We tried her on Lamictal however patient stopped taking the Lamictal after 2 days when she is feeling tired and having achy feeling.  But patient realized that she has had given enough time to the Lamictal.  She is taking Trintellix 20 mg daily.  She like Klonopin 0.5 mg at bedtime and half tablet as needed which is prescribed by her primary care physician.  However she has noticed last night she could not sleep and she has taken the Ambien.  She still feels chronic depression with fatigue, lack of motivation and energy.  She has not started volunteer work but scheduled to see therapist in this office.  Patient denies any suicidal parts or homicidal thought.  She endorse chronic sadness, discouragement and low self-esteem and indecisiveness.  Her appetite is okay.  Her vitals are stable.  Patient used to enjoy her volunteer work and going for regular exercise and yoga but has not done in past 3 years.  Patient denies drinking or using any illegal substances.  She lives with her husband who is very supportive. We still awaiting collateral information from her primary care physician including any recent blood work results.  Suicidal Ideation: No Plan Formed: No Patient has means to carry out plan: No  Homicidal Ideation: No Plan Formed: No Patient has means to carry out plan:  No  Past Psychiatric History/Hospitalization(s): Patient has history of depression since age 50 and she had suicidal attempt at that time when she took overdose on medication.  Patient has at least 2 psychiatric hospitalization.  Patient endorse history of anger issues and irritability.  She denies any history of mania but endorsed history of impulsive behavior.  She remember taking Concerta, Wellbutrin, Prozac, lithium, Effexor, Lexapro, Abilify and Cymbalta.  She remember the best treatment when she had given a female hormone to help endometriosis.  In 2016 she has history of ECT treatment which was failed and then she did [place] which helped her.  She remember Abilify lithium and Wellbutrin did not work.  Patient denies any history of psychosis, hallucination, nightmares or flashback.  She denies any history of physical sexual verbal abuse. Anxiety: No Bipolar Disorder: No Depression: Yes Mania: No Psychosis: No Schizophrenia: No Personality Disorder: No Hospitalization for psychiatric illness: Yes History of Electroconvulsive Shock Therapy: Yes Prior Suicide Attempts: Yes  Family History; Patient endorse a brother committed suicide .  She mentioned he has a history of alcoholism.    Medical History; Patient see Dr. Tiburcio Bash in [place].  She has hypothyroidism, sleep apnea and restless leg   Review of Systems  Constitutional: Negative.   Cardiovascular: Negative for chest pain and palpitations.  Musculoskeletal: Negative.   Skin: Negative for itching and rash.  Neurological: Negative for headaches.    Psychiatric: Agitation: No Hallucination: No Depressed  Mood: Yes Insomnia: Yes Hypersomnia: No Altered Concentration: No Feels Worthless: Yes Grandiose Ideas: No Belief In Special Powers: No New/Increased Substance Abuse: No Compulsions: No  Neurologic: Headache: No Seizure: No Paresthesias: No   Outpatient Encounter Prescriptions as of 08/01/2015  Medication Sig  .  lamoTRIgine (LAMICTAL) 25 MG tablet Take 1 tab daily for 1 wek and than 2 tab daily  . [DISCONTINUED] lamoTRIgine (LAMICTAL) 25 MG tablet Take 1 tab daily for 1 wek and than 2 tab daily  . clonazePAM (KLONOPIN) 0.5 MG tablet Take 0.5 mg by mouth 2 (two) times daily as needed for anxiety.  Marland Kitchen estradiol (ESTRACE) 0.5 MG tablet Take 1 tablet (0.5 mg total) by mouth daily.  Marland Kitchen levothyroxine (SYNTHROID, LEVOTHROID) 88 MCG tablet   . omeprazole (PRILOSEC) 40 MG capsule   . thyroid (ARMOUR) 30 MG tablet Take 30 mg by mouth daily before breakfast.  . traZODone (DESYREL) 50 MG tablet Take 1to 1/2 tab daily at bed time  . TRINTELLIX 20 MG TABS Take 20 mg by mouth daily.  . [DISCONTINUED] TRINTELLIX 20 MG TABS Take 20 mg by mouth daily.   No facility-administered encounter medications on file as of 08/01/2015.    No results found for this or any previous visit (from the past 2160 hour(s)).    Constitutional:  BP 111/69 mmHg  Pulse 89  Ht 5\' 2"  (1.575 m)  Wt 170 lb 3.2 oz (77.202 kg)  BMI 31.12 kg/m2  LMP  (LMP Unknown)   Musculoskeletal: Strength & Muscle Tone: within normal limits Gait & Station: normal Patient leans: N/A  Psychiatric Specialty Exam: General Appearance: Casual  Eye Contact::  Fair  Speech:  Slow  Volume:  Decreased  Mood:  Depressed and Dysphoric  Affect:  Constricted and Depressed  Thought Process:  Intact  Orientation:  Full (Time, Place, and Person)  Thought Content:  WDL and Rumination  Suicidal Thoughts:  No  Homicidal Thoughts:  No  Memory:  Immediate;   Fair Recent;   Fair Remote;   Fair  Judgement:  Fair  Insight:  Fair  Psychomotor Activity:  Normal  Concentration:  Fair  Recall:  Good  Fund of Knowledge:  Fair  Language:  Fair  Akathisia:  No  Handed:  Right  AIMS (if indicated):     Assets:  Communication Skills Desire for Improvement Financial Resources/Insurance Housing Physical Health Social Support Talents/Skills Transportation   ADL's:  Intact  Cognition:  WNL  Sleep:        Review of Psycho-Social Stressors (1), Decision to obtain old records (1), Review and summation of old records (2), Established Problem, Worsening (2), Review of Last Therapy Session (1), Review of Medication Regimen & Side Effects (2) and Review of New Medication or Change in Dosage (2)  Assessment: Axis I: Major depressive disorder, recurrent moderate  Axis II: Deferred  Axis III:  Past Medical History  Diagnosis Date  . Hypothyroidism   . GERD (gastroesophageal reflux disease)   . Sleep apnea   . Major depressive disorder, recurrent episode, mild ([place])      Plan:  I had a long discussion with the patient about giving an of time for the medication to work.  I explained few days of Lamictal will not help her depression as these medication requires more time.  Patient agreed and promised to continue the Lamictal for little longer.  She has no rash, itching, headaches or any shakes.  I also recommended not to take Ambien and try trazodone  50 mg half to one tablet which had helped her in the past for insomnia.  He also discuss if symptoms do not improve we can consider a refresher course for [place].  Begin is still reading collateral from her primary care physician.  Patient had appointment with therapist on 11th.  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 Recommended to call us back if she has any question or any concern.  Follow-up in 4 weeks.  Discuss safety plan that anytime having active suicidal thoughts or homicidal thoughts and she need to call 911 or go to the local emergency room.  Abriel Hattery T., MD 08/01/2015

## 2015-08-01 NOTE — Therapy (Signed)
[facility] [address] [place], [address] Phone: [phone]   Fax:  [phone]  Physical Therapy Treatment/Discharge Summary  Patient Details  Name: [name] MRN: [id number] Date of Birth: 1945/06/14 Referring Provider: Cameron Proud  Encounter Date: 08/01/2015      PT End of Session - 08/01/15 1131    Visit Number 2   Number of Visits 13   Date for PT Re-Evaluation 2015-10-03   Authorization Type g codes every 10th visit/30 days   PT Start Time 1035   PT Stop Time 1115   PT Time Calculation (min) 40 min   Activity Tolerance Patient tolerated treatment well   Behavior During Therapy [facility] for tasks assessed/performed      Past Medical History  Diagnosis Date  . Hypothyroidism   . GERD (gastroesophageal reflux disease)   . Sleep apnea   . Major depressive disorder, recurrent episode, mild (HCC)     Past Surgical History  Procedure Laterality Date  . No past surgeries    . Abdominal hysterectomy    . Cholecystectomy      There were no vitals filed for this visit.  Visit Diagnosis:  Pain in left knee  Abnormality of gait      Subjective Assessment - 08/01/15 1130    Subjective Pt reports L knee pain continues to improve. She was only able to perform her exercises once since the initial evaluation. Pt does not believe that she will need any further PT services after today's visit as knee appears to be improving.    Pertinent History Pt reports she worked with a Clinical research associate about 6 weeks ago. She did not feel like she "over did it" however the following day she felt like her L knee wanted to "give out on her." This occurs while she is walking and pt does not know of any aggravating factors. Pt denies any trauma or popping during the exercise. Pt reports the pain is "right at the top of the knee cap." She describes the pain as sharp when the knee would give out on her. She has a knee brace that  she wears that tends to help with the pain. Pt reports that pain is gradually improving. She has not had pain in the last few days and the knee has not given out of her in the last couple days. Pt states she is anxious that it may happen again. She is also complaining of some muscle soreness/pain on the lateral aspect of the knee. She walks around a lot barefooted while at home. No radiating symptoms. Pt reports initial swelling in L knee but has since resolved. No bruising or warmth. Pt reports history of R side hip pain with walking on hard surfaces. Intermittent "tightness" in low back but it occurs rarely and does not coincide with L knee weakness/pain. Denies numbness/tingling. Denies catching, popping, or clicking in L knee.    Patient Stated Goals Decrease pain with all activities at home   Currently in Pain? No/denies         Treatment  Manual Therapy IASTM and ischemic compression to distal L quadriceps/quad tendon; L patellar mobilizations grade II in inferior, medial, and lateral directions 3 bouts x 30 seconds each; Prone R quad stretch 30 second hold x 3; Reinforced stretching techniques for home;  Ther-ex Prone eccentric quad contraction with slow flexion and manual resistance 2 x 5; Total gym isometric "wall squats" with focus on LLE for eccentric  phase and RLE for concentric phase, 10 sec on/off 2 x 5; Education about home exercise program;                       PT Education - 08/18/2015 1131    Education provided Yes   Education Details Continue HEP, follow-up as needed   Person(s) Educated Patient   Methods Explanation   Comprehension Verbalized understanding             PT Long Term Goals - 2015/08/18 1133    PT LONG TERM GOAL #1   Title Pt will be independent with HEP in order to decrease pain and improve function at home and with volunteer work.   Time 6   Period Weeks   Status Achieved   PT LONG TERM GOAL #2   Title Pt will report  decrease in LEFS by at least 9 points in order to see clinically significant improvement in lower extremity function   Baseline 07/29/15: 50/80,08/18/2015: not repeated on this date   Time 6   Period Weeks   Status Deferred   PT LONG TERM GOAL #3   Title Pt will report 0/10 pain in L knee with all aggravating activities to complete all responsibilities at home and with volunteer work without pain   Baseline 07/29/15: worst 1/10; 2015/08/18: Pt reports no further pain at this time.    Time 6   Period Weeks   Status Achieved   PT LONG TERM GOAL #4   Title Pt will demonstrate normalized gait pattern with full L knee extension prior to initial contact (symmetrical to R side) to decrease pain with ambulation and prevent further hip/low back injury   Time 6   Period Weeks   Status Deferred               Plan - August 18, 2015 1131    Clinical Impression Statement Pt reports minimal pain with soft tissue mobilization of L distal quad on this date. She reports some mild reproduction of pain with isometric total gym squats. Pt reports that her L knee symptoms are improving and she has not had any more episodes of her leg knee giving out. She does not believe she needs any further PT after today's ession. Pt will be discharged at this time. Encouraged to follow-up with orthopedist as needed if symptoms recur.   Pt will benefit from skilled therapeutic intervention in order to improve on the following deficits Abnormal gait;Difficulty walking;Pain   Rehab Potential Good   Clinical Impairments Affecting Rehab Potential Positive: motivation, improvement; Negative: anxiety   PT Frequency 2x / week   PT Duration 6 weeks   PT Treatment/Interventions Aquatic Therapy;Electrical Stimulation;Cryotherapy;Iontophoresis 4mg /ml Dexamethasone;Moist Heat;Traction;Ultrasound;DME Instruction;Gait training;Stair training;Functional mobility training;Therapeutic activities;Therapeutic exercise;Balance training;Neuromuscular  re-education;Patient/family education;Manual techniques;Dry needling   PT Next Visit Plan Discharge   PT Home Exercise Plan Prone quad stretch, wall squats, piriformis stretch   Consulted and Agree with Plan of Care Patient          G-Codes - 2015-08-18 1134    Functional Assessment Tool Used clinical judgement, NPRS   Functional Limitation Mobility: Walking and moving around   Mobility: Walking and Moving Around Current Status [id number]) 0 percent impaired, limited or restricted   Mobility: Walking and Moving Around Goal Status [id number]) 0 percent impaired, limited or restricted      Problem List Patient Active Problem List   Diagnosis Date Noted  . MDD (major depressive disorder), recurrent episode, severe ([place])  06/04/2014   Phillips Grout PT, DPT   Huprich,Jason 08/01/2015, 11:39 AM  [place] [address], [place], [address] Phone: [phone]   Fax:  [phone]  Name: Aaliayh Biddulph MRN: [id number] Date of Birth: 06-Apr-1946

## 2015-08-05 ENCOUNTER — Ambulatory Visit: Payer: Medicare Other | Admitting: Physical Therapy

## 2015-08-08 ENCOUNTER — Encounter: Payer: Medicare Other | Admitting: Physical Therapy

## 2015-08-28 ENCOUNTER — Ambulatory Visit (INDEPENDENT_AMBULATORY_CARE_PROVIDER_SITE_OTHER): Payer: Medicare Other | Admitting: Clinical

## 2015-08-28 ENCOUNTER — Encounter (HOSPITAL_COMMUNITY): Payer: Self-pay | Admitting: Clinical

## 2015-08-28 DIAGNOSIS — F331 Major depressive disorder, recurrent, moderate: Secondary | ICD-10-CM | POA: Diagnosis not present

## 2015-09-02 ENCOUNTER — Encounter (HOSPITAL_COMMUNITY): Payer: Self-pay | Admitting: Psychiatry

## 2015-09-02 ENCOUNTER — Ambulatory Visit (INDEPENDENT_AMBULATORY_CARE_PROVIDER_SITE_OTHER): Payer: BLUE CROSS/BLUE SHIELD | Admitting: Psychiatry

## 2015-09-02 VITALS — BP 132/74 | HR 77 | Ht 62.0 in | Wt 173.4 lb

## 2015-09-02 DIAGNOSIS — F331 Major depressive disorder, recurrent, moderate: Secondary | ICD-10-CM

## 2015-09-02 MED ORDER — LAMOTRIGINE 25 MG PO TABS: ORAL_TABLET | ORAL | Status: DC

## 2015-09-02 MED ORDER — TRINTELLIX 20 MG PO TABS: 20.0000 mg | ORAL_TABLET | Freq: Every day | ORAL | Status: DC

## 2015-09-02 MED ORDER — TRAZODONE HCL 50 MG PO TABS: ORAL_TABLET | ORAL | Status: DC
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
## 2015-09-02 NOTE — Progress Notes (Signed)
[place] progress Note  [name] [id number] 70 y.o.  09/02/2015 4:57 PM  Chief Complaint:  I am doing much better with Lamictal.  However I like to go back on lorazepam because I was sleeping better with lorazepam.  Klonopin does not help me as much.  History of Present Illness:  [name] came for her follow-up appointment .  She is taking her medication as prescribed.  However she like to go back on lorazepam which was given by her primary care physician because she is not sleeping as good with Klonopin.  She really liked Lamictal.  She she is more active, social and denies any irritability or any crying spells.  She is no longer taking Ambien.  She scheduled to see her primary care physician and she will request lorazepam from her.  She also started [place] for counseling.  Her energy level is good.  Her vitals are stable.  Patient denies drinking or using any illegal substances.  She lives with her husband who is very supportive.   Suicidal Ideation: No Plan Formed: No Patient has means to carry out plan: No  Homicidal Ideation: No Plan Formed: No Patient has means to carry out plan: No  Past Psychiatric History/Hospitalization(s): Patient has history of depression since age 72 and she had suicidal attempt at that time when she took overdose on medication.  Patient has at least 2 psychiatric hospitalization.  Patient endorse history of anger issues and irritability.  She denies any history of mania but endorsed history of impulsive behavior.  She remember taking Concerta, Wellbutrin, Prozac, lithium, Effexor, Lexapro, Abilify and Cymbalta.  She remember the best treatment when she had given a female hormone to help endometriosis.  In 2016 she has history of ECT treatment which was failed and then she did [place] which helped her.  She remember Abilify lithium and Wellbutrin did not work.  Patient denies any history of psychosis, hallucination, nightmares or flashback.  She  denies any history of physical sexual verbal abuse. Anxiety: No Bipolar Disorder: No Depression: Yes Mania: No Psychosis: No Schizophrenia: No Personality Disorder: No Hospitalization for psychiatric illness: Yes History of Electroconvulsive Shock Therapy: Yes Prior Suicide Attempts: Yes  Family History; Patient endorse a brother committed suicide .  She mentioned he has a history of alcoholism.    Medical History; Patient see Dr. Tiburcio Bash in [place].  She has hypothyroidism, sleep apnea and restless leg   Review of Systems  Constitutional: Negative.   Cardiovascular: Negative for chest pain and palpitations.  Musculoskeletal: Negative.   Skin: Negative for itching and rash.  Neurological: Negative for headaches.    Psychiatric: Agitation: No Hallucination: No Depressed Mood: Yes Insomnia: Yes Hypersomnia: No Altered Concentration: No Feels Worthless: Yes Grandiose Ideas: No Belief In Special Powers: No New/Increased Substance Abuse: No Compulsions: No  Neurologic: Headache: No Seizure: No Paresthesias: No   Outpatient Encounter Prescriptions as of 09/02/2015  Medication Sig  . estradiol (ESTRACE) 0.5 MG tablet Take 1 tablet (0.5 mg total) by mouth daily.  Marland Kitchen lamoTRIgine (LAMICTAL) 25 MG tablet Take 2 tab daily  . levothyroxine (SYNTHROID, LEVOTHROID) 88 MCG tablet   . LORazepam (ATIVAN) 1 MG tablet   . omeprazole (PRILOSEC) 40 MG capsule   . thyroid (ARMOUR) 30 MG tablet Take 30 mg by mouth daily before breakfast.  . traZODone (DESYREL) 50 MG tablet Take 1to 1/2 tab daily at bed time  . TRINTELLIX 20 MG TABS Take 20 mg by mouth daily.  . [  DISCONTINUED] clonazePAM (KLONOPIN) 0.5 MG tablet Take 0.5 mg by mouth 2 (two) times daily as needed for anxiety.  . [DISCONTINUED] lamoTRIgine (LAMICTAL) 25 MG tablet Take 1 tab daily for 1 wek and than 2 tab daily  . [DISCONTINUED] traZODone (DESYREL) 50 MG tablet Take 1to 1/2 tab daily at bed time  . [DISCONTINUED]  TRINTELLIX 20 MG TABS Take 20 mg by mouth daily.   No facility-administered encounter medications on file as of 09/02/2015.    No results found for this or any previous visit (from the past 2160 hour(s)).    Constitutional:  BP 132/74 mmHg  Pulse 77  Ht 5\' 2"  (1.575 m)  Wt 173 lb 6.4 oz (78.654 kg)  BMI 31.71 kg/m2  LMP  (LMP Unknown)   Musculoskeletal: Strength & Muscle Tone: within normal limits Gait & Station: normal Patient leans: N/A  Psychiatric Specialty Exam: General Appearance: Casual  Eye Contact::  Fair  Speech:  Slow  Volume:  Decreased  Mood:  Anxious  Affect:  Congruent  Thought Process:  Intact  Orientation:  Full (Time, Place, and Person)  Thought Content:  WDL  Suicidal Thoughts:  No  Homicidal Thoughts:  No  Memory:  Immediate;   Fair Recent;   Fair Remote;   Fair  Judgement:  Fair  Insight:  Fair  Psychomotor Activity:  Normal  Concentration:  Fair  Recall:  Good  Fund of Knowledge:  Fair  Language:  Fair  Akathisia:  No  Handed:  Right  AIMS (if indicated):     Assets:  Communication Skills Desire for Improvement Financial Resources/Insurance Housing Physical Health Social Support Talents/Skills Transportation  ADL's:  Intact  Cognition:  WNL  Sleep:        Established Problem, Stable/Improving (1), Review of Psycho-Social Stressors (1), Review of Last Therapy Session (1) and Review of Medication Regimen & Side Effects (2)  Assessment: Axis I: Major depressive disorder, recurrent moderate  Axis II: Deferred  Axis III:  Past Medical History  Diagnosis Date  . Hypothyroidism   . GERD (gastroesophageal reflux disease)   . Sleep apnea   . Major depressive disorder, recurrent episode, mild ([place])      Plan:  Patient is doing better on Lamictal.  She like to go back on lorazepam .  I will discontinue on a pain.  She will continue trazodone at bedtime.  She has no rash or itching.  She has no shakes or tremors.  Encouraged to  keep appointment with Tharon Aquas. Recommended to call us back if she has any question or any concern.  Follow-up in 12 weeks.  Discuss safety plan that anytime having active suicidal thoughts or homicidal thoughts and she need to call 911 or go to the local emergency room.  Verenise Moulin T., MD 09/02/2015

## 2015-09-10 DIAGNOSIS — F339 Major depressive disorder, recurrent, unspecified: Secondary | ICD-10-CM | POA: Diagnosis not present

## 2015-09-10 DIAGNOSIS — G4733 Obstructive sleep apnea (adult) (pediatric): Secondary | ICD-10-CM | POA: Diagnosis not present

## 2015-09-19 ENCOUNTER — Ambulatory Visit (HOSPITAL_COMMUNITY): Payer: Self-pay | Admitting: Clinical

## 2015-09-25 ENCOUNTER — Ambulatory Visit (INDEPENDENT_AMBULATORY_CARE_PROVIDER_SITE_OTHER): Payer: Medicare Other | Admitting: Clinical

## 2015-09-25 ENCOUNTER — Encounter (HOSPITAL_COMMUNITY): Payer: Self-pay | Admitting: Clinical

## 2015-09-25 ENCOUNTER — Telehealth (HOSPITAL_COMMUNITY): Payer: Self-pay

## 2015-09-25 DIAGNOSIS — F331 Major depressive disorder, recurrent, moderate: Secondary | ICD-10-CM

## 2015-09-25 MED ORDER — LAMOTRIGINE 25 MG PO TABS: 75.0000 mg | ORAL_TABLET | Freq: Every day | ORAL | Status: DC

## 2015-09-25 NOTE — Progress Notes (Signed)
   THERAPIST PROGRESS NOTE  Session Time: 12:00 -12:57  Participation Level: Active  Behavioral Response: CasualAlertDepressed  Type of Therapy: Individual Therapy  Treatment Goals addressed: Improve psychiatric symptoms, stress management (stress reduction), improve unhelpful thought patterns, elevate mood, interpersonal relationship skills, learn about diagnosis,   Interventions: CBT and Motivational Interviewing, psycho education, grounding and mindfulness techniques  Summary: [name] is a 70 y.o. female who presents with Major Depressive Disorder, Recurrent, Moderate.   Suicidal/Homicidal: Nowithout intent/plan  Therapist Response: Joaquim Lai met with clinician for an individual session. [name] shared about her psychiatric symptoms, current life events and her goals for therapy. She shared that she is continued to be depressed since her last session. She shared that she has been making some effort to increase her social interaction. Client and clinician discussed her effort and ways that she may improve it. [name] discussed some of her relationship issues in more detail. She also gave an example of how her negative thinking has affected her. Clinician introduced some basic CBT concepts. Client and clinician discussed how our thoughts affect our emotions and also are behaviors. Clinician introduced some grounding and mindfulness techniques. Clinician explained the process the purpose of the practice of the techniques. Client and clinician practice some of the grounding techniques for interrupting negative thought cycles. Client and clinician discussed how this would be used and conjunction with CBT to change her negative thought patterns. Clinician also discussed how different grounding and mindfulness techniques could be used to reduce stress. Clinician introduced a homework packet on depression. [name] agreed to complete the packet and bring it back with her to next session. Clinician  also introduce the idea of keeping a gratitude journal client and clinician discussed the process and how to get the most therapeutic benefit from her practice. Client also agreed to practice at least 2 grounding or mindfulness techniques daily until next session.   Plan: Return again in 1-2 weeks.  Diagnosis: Axis I: Major Depressive Disorder, Recurrent, Moderate.      Tina Gruner,Aniza A, LCSW 09/25/2015

## 2015-09-25 NOTE — Telephone Encounter (Signed)
Patient came in and left a note for Dr. Adele Schilder that she had increased her Lamictal to 3 tabs a day. I sent in a new prescription to reflect the new sig and called the pharmacy to d.c the last 2 refills on the 2 a day.

## 2015-10-17 ENCOUNTER — Ambulatory Visit (HOSPITAL_COMMUNITY): Payer: Self-pay | Admitting: Clinical

## 2015-10-22 DIAGNOSIS — Z0001 Encounter for general adult medical examination with abnormal findings: Secondary | ICD-10-CM | POA: Diagnosis not present

## 2015-10-22 DIAGNOSIS — F3341 Major depressive disorder, recurrent, in partial remission: Secondary | ICD-10-CM | POA: Diagnosis not present

## 2015-10-22 DIAGNOSIS — G4733 Obstructive sleep apnea (adult) (pediatric): Secondary | ICD-10-CM | POA: Diagnosis not present

## 2015-10-22 DIAGNOSIS — E039 Hypothyroidism, unspecified: Secondary | ICD-10-CM | POA: Diagnosis not present

## 2015-10-22 DIAGNOSIS — F411 Generalized anxiety disorder: Secondary | ICD-10-CM | POA: Diagnosis not present

## 2015-10-31 ENCOUNTER — Ambulatory Visit (INDEPENDENT_AMBULATORY_CARE_PROVIDER_SITE_OTHER): Payer: Medicare Other | Admitting: Psychiatry

## 2015-10-31 ENCOUNTER — Encounter (HOSPITAL_COMMUNITY): Payer: Self-pay | Admitting: Psychiatry

## 2015-10-31 VITALS — BP 170/80 | HR 96 | Ht 62.0 in | Wt 174.8 lb

## 2015-10-31 DIAGNOSIS — F331 Major depressive disorder, recurrent, moderate: Secondary | ICD-10-CM | POA: Diagnosis not present

## 2015-10-31 MED ORDER — LAMOTRIGINE 100 MG PO TABS: 100.0000 mg | ORAL_TABLET | Freq: Every day | ORAL | Status: DC

## 2015-10-31 NOTE — Progress Notes (Signed)
[place] [phone] progress Note  [name] [id number] 70 y.o.  10/31/2015 4:42 PM  Chief Complaint:  I started to feel depressed and very anxious.  I think my Lamictal is not strong enough.  I'm feeling very isolated, having crying spells and depressed.   History of Present Illness:  [name] came earlier than her scheduled appointment.  On her last visit we switch her Klonopin to lorazepam and she is sleeping better.  However she is feeling more sad depressed and anxious.  She is not sure what is causing her depression but admitted having crying spells, fatigue, lack of motivation to do things.  She has no tremors shakes or any EPS.  She has no rash or itching.  She recently seen her primary care physician Dr. Rogue Garner but here were no changes.  Her appetite is fair.  Her vital signs are stable.  She is not drinking or using any illegal substances.  She lives with her husband was very supportive.  She is wondering if Lamictal can further increase.   Suicidal Ideation: No Plan Formed: No Patient has means to carry out plan: No  Homicidal Ideation: No Plan Formed: No Patient has means to carry out plan: No  Past Psychiatric History/Hospitalization(s): Patient has history of depression since age 13 and she had suicidal attempt at that time when she took overdose on medication.  Patient has at least 2 psychiatric hospitalization.  Patient endorse history of anger issues and irritability.  She denies any history of mania but endorsed history of impulsive behavior.  She remember taking Concerta, Wellbutrin, Prozac, lithium, Effexor, Lexapro, Abilify and Cymbalta.  She remember the best treatment when she had given a female hormone to help endometriosis.  In 2016 she has history of ECT treatment which was failed and then she did [employer] which helped her.  She remember Abilify lithium and Wellbutrin did not work.  Patient denies any history of psychosis, hallucination, nightmares or  flashback.  She denies any history of physical sexual verbal abuse. Anxiety: No Bipolar Disorder: No Depression: Yes Mania: No Psychosis: No Schizophrenia: No Personality Disorder: No Hospitalization for psychiatric illness: Yes History of Electroconvulsive Shock Therapy: Yes Prior Suicide Attempts: Yes  Family History; Patient endorse a brother committed suicide .  She mentioned he has a history of alcoholism.    Medical History; Patient see Dr. Tiburcio Garner in [place].  She has hypothyroidism, sleep apnea and restless leg   Review of Systems  Constitutional: Negative.   Cardiovascular: Negative for chest pain and palpitations.  Musculoskeletal: Negative.   Skin: Negative for itching and rash.  Neurological: Negative for headaches.  Psychiatric/Behavioral: Positive for depression. The patient is nervous/anxious. The patient does not have insomnia.     Psychiatric: Agitation: No Hallucination: No Depressed Mood: Yes Insomnia: Yes Hypersomnia: No Altered Concentration: No Feels Worthless: Yes Grandiose Ideas: No Belief In Special Powers: No New/Increased Substance Abuse: No Compulsions: No  Neurologic: Headache: No Seizure: No Paresthesias: No   Outpatient Encounter Prescriptions as of 10/31/2015  Medication Sig  . estradiol (ESTRACE) 0.5 MG tablet Take 1 tablet (0.5 mg total) by mouth daily.  Marland Kitchen lamoTRIgine (LAMICTAL) 100 MG tablet Take 1 tablet (100 mg total) by mouth daily.  Marland Kitchen levothyroxine (SYNTHROID, LEVOTHROID) 88 MCG tablet   . LORazepam (ATIVAN) 1 MG tablet   . omeprazole (PRILOSEC) 40 MG capsule   . thyroid (ARMOUR) 30 MG tablet Take 30 mg by mouth daily before breakfast.  . traZODone (DESYREL) 50 MG  tablet Take 1to 1/2 tab daily at bed time  . TRINTELLIX 20 MG TABS Take 20 mg by mouth daily.  . [DISCONTINUED] lamoTRIgine (LAMICTAL) 25 MG tablet Take 3 tablets (75 mg total) by mouth daily. Take 2 tab daily   No facility-administered encounter  medications on file as of 10/31/2015.    No results found for this or any previous visit (from the past 2160 hour(s)).    Constitutional:  BP 170/80 mmHg  Pulse 96  Ht 5\' 2"  (1.575 m)  Wt 174 lb 12.8 oz (79.289 kg)  BMI 31.96 kg/m2  LMP  (LMP Unknown)   Musculoskeletal: Strength & Muscle Tone: within normal limits Gait & Station: normal Patient leans: N/A  Psychiatric Specialty Exam: General Appearance: Casual  Eye Contact::  Fair  Speech:  Slow  Volume:  Decreased  Mood:  Anxious, Depressed and Tearful  Affect:  Congruent  Thought Process:  Intact  Orientation:  Full (Time, Place, and Person)  Thought Content:  WDL  Suicidal Thoughts:  No  Homicidal Thoughts:  No  Memory:  Immediate;   Fair Recent;   Fair Remote;   Fair  Judgement:  Fair  Insight:  Fair  Psychomotor Activity:  Normal  Concentration:  Fair  Recall:  Good  Fund of Knowledge:  Fair  Language:  Fair  Akathisia:  No  Handed:  Right  AIMS (if indicated):     Assets:  Communication Skills Desire for Improvement Financial Resources/Insurance Housing Physical Health Social Support Talents/Skills Transportation  ADL's:  Intact  Cognition:  WNL  Sleep:        Established Problem, Stable/Improving (1), Review of Psycho-Social Stressors (1), Established Problem, Worsening (2), Review of Last Therapy Session (1), Review of Medication Regimen & Side Effects (2) and Review of New Medication or Change in Dosage (2)  Assessment: Axis I: Major depressive disorder, recurrent moderate  Axis II: Deferred  Axis III:  Past Medical History  Diagnosis Date  . Hypothyroidism   . GERD (gastroesophageal reflux disease)   . Sleep apnea   . Major depressive disorder, recurrent episode, mild ([place])      Plan:  I review her symptoms.  Recommended to increase Lamictal 100 mg daily.  Discussed medication side effects and if she develop any rash that she needed to stop the Lamictal immediately.  Patient like  to continue lorazepam and trazodone which is helping her sleep.  Encouraged to keep appointment with [facility] for counseling.  We will contact her primary care physician for blood work results .  Recommended to call us back if she has any question or any concern.  Follow-up in 6 weeks. Discuss safety plan that anytime having active suicidal thoughts or homicidal thoughts and she need to call 911 or go to the local emergency room.  Jo Cerone T., MD 10/31/2015

## 2015-11-06 ENCOUNTER — Ambulatory Visit (INDEPENDENT_AMBULATORY_CARE_PROVIDER_SITE_OTHER): Payer: Medicare Other | Admitting: Clinical

## 2015-11-06 ENCOUNTER — Encounter (HOSPITAL_COMMUNITY): Payer: Self-pay | Admitting: Clinical

## 2015-11-06 DIAGNOSIS — E2839 Other primary ovarian failure: Secondary | ICD-10-CM | POA: Diagnosis not present

## 2015-11-06 DIAGNOSIS — F339 Major depressive disorder, recurrent, unspecified: Secondary | ICD-10-CM | POA: Diagnosis not present

## 2015-11-06 DIAGNOSIS — I10 Essential (primary) hypertension: Secondary | ICD-10-CM | POA: Diagnosis not present

## 2015-11-06 DIAGNOSIS — F331 Major depressive disorder, recurrent, moderate: Secondary | ICD-10-CM | POA: Diagnosis not present

## 2015-11-06 DIAGNOSIS — Z0001 Encounter for general adult medical examination with abnormal findings: Secondary | ICD-10-CM | POA: Diagnosis not present

## 2015-11-06 DIAGNOSIS — G2581 Restless legs syndrome: Secondary | ICD-10-CM | POA: Diagnosis not present

## 2015-11-06 DIAGNOSIS — E039 Hypothyroidism, unspecified: Secondary | ICD-10-CM | POA: Diagnosis not present

## 2015-11-06 NOTE — Progress Notes (Signed)
   THERAPIST PROGRESS NOTE  Session Time:   3:30 -4:25  Participation Level: Active  Behavioral Response: CasualAlertNA and Depressed Type of Therapy: Individual Therapy  Treatment Goals addressed: Improve psychiatric symptoms, , improve unhelpful thought patterns, elevate mood (  increased social interaction), interpersonal relationship skills, learn about diagnosis,   Interventions: CBT and Motivational Interviewing, psycho education,   Summary: [name] is Garner 70 y.o. female who presents with Major Depressive Disorder, Recurrent, Moderate.   Suicidal/Homicidal: No without intent/plan  Therapist Response: [name] met with clinician for an individual session. [name] shared about her psychiatric symptoms, current life events and her goals for therapy. She shared that she has seen an improvement in her depression after an recent adjustment to her medications. She shared that she has been with family and had realized that she was not doing well, so asked for her meds to be reviewed. She shared that she had been using her grounding technique to help interrupt racing thoughts at bed time. She shared she finds this very helpful. [name] shared that she had completed her depression packet  Homework. Client and clinician reviewed and discussed her homework. [name] was teary when discussing depression and isolation. Client and clinician discussed how depression had affected her interactions with others. [name] shared that it continues to affect her. She shared that even though she is working not to isolate her negative thoughts tell her because she had isolated so long that she does not have anything interesting to offer in conversation with others. Client and clinician discussed how our thoughts inform our perceptions and emotions. Client and clinician discussed the evidence for and against her negative thoughts. Client and clinician discussed interpersonal relationship skills. Client and  clinician briefly discussed [name]'s gratitude journal. [name] agreed to complete packet 3 of depression, continue to keep her gratitude journal and to continue practicing her grounding and mindfulness techniques.       Plan: Return again in 1-2 weeks.  Diagnosis: Axis I: Major Depressive Disorder, Recurrent, Moderate.    [name],Emily A, LCSW 11/06/2015

## 2015-11-27 ENCOUNTER — Ambulatory Visit (HOSPITAL_COMMUNITY): Payer: Self-pay | Admitting: Clinical

## 2015-11-28 ENCOUNTER — Ambulatory Visit (HOSPITAL_COMMUNITY): Payer: Self-pay | Admitting: Clinical

## 2015-12-03 ENCOUNTER — Ambulatory Visit (HOSPITAL_COMMUNITY): Payer: Self-pay | Admitting: Psychiatry

## 2015-12-05 ENCOUNTER — Ambulatory Visit (INDEPENDENT_AMBULATORY_CARE_PROVIDER_SITE_OTHER): Payer: Medicare Other | Admitting: Clinical

## 2015-12-05 DIAGNOSIS — F331 Major depressive disorder, recurrent, moderate: Secondary | ICD-10-CM | POA: Diagnosis not present

## 2015-12-05 NOTE — Progress Notes (Signed)
   THERAPIST PROGRESS NOTE  Session Time:3:30 -4:28  Participation Level: Active  Behavioral Response: CasualAlertDepressed  Type of Therapy: Individual Therapy  Treatment Goals addressed: Improve psychiatric symptoms, stress management (stress reduction and stress tolerance), improve unhelpful thought patterns, elevate mood (increased energy, improve concentration, increased social interaction), interpersonal relationship skills, learn about diagnosis, healthy coping skills,   Interventions: CBT and Motivational Interviewing, psycho education, grounding and mindfulness techniques  Summary: [name] is a 70 y.o. female who presents with Major Depressive Disorder, Recurrent, Moderate.   Suicidal/Homicidal: No without intent/plan  Therapist Response: Joaquim Lai met with clinician for an individual session. [name] shared about her psychiatric symptoms, current life events and her goals for therapy. She shared that she has been depressed but there "is no cause for it". Then she shared that her husband had surgery and another health scare. She shared she had not completed her homework packet and she needed assistance understanding how to challenge thoughts. Client and clinician reviewed and discussed her homework. She asked clarifying questions and clinician answered. Client and clinician discussed the events that led to her recent hospitalization. Client and clinician discussed her diagnosis and the benefit of therapy with medication. Client and clinician discussed her symptoms and her behaviors.   Clinician asked open ended questions and [name] shared that when the depression began she started doing things such as isolating that increased her depression. Clinician asked about the thoughts and beliefs that made her isolate. [name] identified some of her negative automatic thoughts. Client and clinician discussed the thought emotion connection. Client and clinician used a 7 panel thought record  sheet and explored her negative automatic thoughts. Joaquim Lai and clinician completed it for a few different thoughts and [name] said she understood. Joaquim Lai and clinician practiced a grounding and a mindfulness technique. [name] agreed to complete her homework and to practice her grounding and mindfulness techniques daily until next session.  Plan: Return again in 1-2 weeks.  Diagnosis: Axis I: Major Depressive Disorder, Recurrent, Moderate.    Coutney Wildermuth,Jacari A, LCSW 12/05/2015

## 2015-12-06 ENCOUNTER — Encounter (HOSPITAL_COMMUNITY): Payer: Self-pay | Admitting: Clinical

## 2015-12-25 ENCOUNTER — Other Ambulatory Visit (HOSPITAL_COMMUNITY): Payer: Self-pay | Admitting: Psychiatry

## 2015-12-25 DIAGNOSIS — F331 Major depressive disorder, recurrent, moderate: Secondary | ICD-10-CM

## 2015-12-28 ENCOUNTER — Encounter: Payer: Self-pay | Admitting: Physical Therapy

## 2015-12-31 ENCOUNTER — Other Ambulatory Visit: Payer: Self-pay

## 2015-12-31 ENCOUNTER — Ambulatory Visit (INDEPENDENT_AMBULATORY_CARE_PROVIDER_SITE_OTHER): Payer: Medicare Other | Admitting: Clinical

## 2015-12-31 DIAGNOSIS — F331 Major depressive disorder, recurrent, moderate: Secondary | ICD-10-CM | POA: Diagnosis not present

## 2015-12-31 NOTE — Progress Notes (Signed)
   THERAPIST PROGRESS NOTE  Session Time: 3:35 - 4:30   Participation Level: Active  Behavioral Response: CasualAlertDepressed  Type of Therapy: Individual Therapy  Treatment Goals addressed: Improve psychiatric symptoms, stress management (stress reduction and stress tolerance), improve unhelpful thought patterns, elevate mood (increased energy, improve concentration, increased social interaction), interpersonal relationship skills, learn about diagnosis, healthy coping skills,   Interventions: CBT and Motivational Interviewing, psycho education, grounding and mindfulness techniques  Summary: [name] is a 70 y.o. female who presents with Major Depressive Disorder, Recurrent, Moderate.   Suicidal/Homicidal: No without intent/plan  Therapist Response: Joaquim Lai met with clinician for an individual session. [name] shared about her psychiatric symptoms, current life events and her homework. She shared that she had completed some 7 panel cbt thought record sheets. She shared she was having some difficulty. Client and clinician discussed her challenges and [name] then better understood the process. [name] shared that she was feeling more depressed this week. Clinician asked open ended questions and  she shared that she was having some difficulty with her husband. She shared about some events that were increasing her depression. Clinician asked open ended questions and [name] shared her thoughts and interpretations of the events. Client and clinician discussed the evidence for and against her interpretation. Client and clinician discussed what thoughts or actions would need to change in order for her depression to improve. Clinician asked open ended question and [name] identified how changing her perspective would change her emotional experience. [name] identified alternative perspectives. Clinician gave her a homework packet on depression which she agreed to complete before next  session.  Plan: Return again in 1-2 weeks.  Diagnosis: Axis I: Major Depressive Disorder, Recurrent, Moderate.    Mckay Brandt,Emerald A, LCSW 12/31/2015

## 2016-01-07 ENCOUNTER — Encounter (HOSPITAL_COMMUNITY): Payer: Self-pay | Admitting: Clinical

## 2016-01-15 DIAGNOSIS — G4733 Obstructive sleep apnea (adult) (pediatric): Secondary | ICD-10-CM | POA: Diagnosis not present

## 2016-01-18 ENCOUNTER — Other Ambulatory Visit (HOSPITAL_COMMUNITY): Payer: Self-pay | Admitting: Psychiatry

## 2016-01-18 DIAGNOSIS — F331 Major depressive disorder, recurrent, moderate: Secondary | ICD-10-CM

## 2016-01-22 ENCOUNTER — Ambulatory Visit (HOSPITAL_COMMUNITY): Payer: Self-pay | Admitting: Psychiatry

## 2016-02-03 ENCOUNTER — Ambulatory Visit (INDEPENDENT_AMBULATORY_CARE_PROVIDER_SITE_OTHER): Payer: Medicare Other | Admitting: Clinical

## 2016-02-03 DIAGNOSIS — F332 Major depressive disorder, recurrent severe without psychotic features: Secondary | ICD-10-CM

## 2016-02-03 NOTE — Progress Notes (Signed)
   THERAPIST PROGRESS NOTE  Session Time: 1:30 - 2:28  Participation Level: Active  Behavioral Response: CasualAlertDepressed   Type of Therapy: Individual Therapy  Treatment Goals addressed: Improve psychiatric symptoms, stress management (stress reduction and stress tolerance), improve unhelpful thought patterns, elevate mood (increased energy, improve concentration, increased social interaction), interpersonal relationship skills, learn about diagnosis, healthy coping skills,   Interventions: CBT and Motivational Interviewing, psycho education, grounding and mindfulness techniques  Summary: Vicke Plotner is a 70 y.o. female who presents with Major Depressive Disorder, Recurrent, Moderate.   Suicidal/Homicidal: No without intent/plan  Therapist Response: Joaquim Lai met with clinician for an individual session. Jaysie shared about her psychiatric symptoms, current life events and her homework. She shared that she did not do her homework packet 5 of depression that she would do it this upcoming week and bring it back with her next session. Lalitha shared that she has been experiencing a lot of depression. Clinician asked open ended questions. She cried as she told clinician a bout her depression. She shared that she was not sleeping well she shared that she that the thoughts I wish I was dead was present. She denied any intention or plan of harming herself and her thoughts were not about harming herself wishing that she didn't exist. Clinician asked open ended questions and Opal shared that there were not any big events that happened but that she was frustrated because they had not the house. Clinician asked open ended questions a bout her thoughts. Daenerys's thoughts were catastrophic. Client and clinician used a 7 panel thought record sheet to next or her negative automatic thoughts. Clinician asked open ended questions and Pranika identified her negative thoughts and the emotions. She  identified the evidence for and the evidence against these thoughts. She was then able to formulate healthier alternative thoughts which included some solutions if the house to sell such as binging out of her room to a graduate student. Client and clinician discussed the thought emotional connection. Client and clinician discussed how solutions are easier to find when her focus is on the solutions rather than on the catastrophic thoughts. Client and clinician discussed Alynah is belief that things would never be better. Clinician asked open ended questions and Kamyra identified how she currently felt. Clinician asked a bout the times where she does not feel like this. Jaymi said that she can had feel fine some days and even some partial days. Arriah shared those feelings. Times always away. Clinician pointed out that if she felt was good some days that her negative feelings also were temporary. Client and clinician discussed how depression review to feeling that way. Client and clinician discussed decreasing depression and increasing the good days, while accepting that they would still be some negative days  Plan: Return again in 1-2 weeks.  Diagnosis: Axis I: Major Depressive Disorder, Recurrent, Moderate.    Shalah Estelle,Rynlee A, LCSW 02/03/2016

## 2016-02-07 ENCOUNTER — Other Ambulatory Visit (HOSPITAL_COMMUNITY): Payer: Self-pay | Admitting: Psychiatry

## 2016-02-07 DIAGNOSIS — F331 Major depressive disorder, recurrent, moderate: Secondary | ICD-10-CM

## 2016-02-10 ENCOUNTER — Encounter (HOSPITAL_COMMUNITY): Payer: Self-pay | Admitting: Clinical

## 2016-03-10 DIAGNOSIS — I10 Essential (primary) hypertension: Secondary | ICD-10-CM | POA: Diagnosis not present

## 2016-03-10 DIAGNOSIS — Z23 Encounter for immunization: Secondary | ICD-10-CM | POA: Diagnosis not present

## 2016-03-10 DIAGNOSIS — F339 Major depressive disorder, recurrent, unspecified: Secondary | ICD-10-CM | POA: Diagnosis not present

## 2016-03-10 DIAGNOSIS — E039 Hypothyroidism, unspecified: Secondary | ICD-10-CM | POA: Diagnosis not present

## 2016-03-12 ENCOUNTER — Other Ambulatory Visit (HOSPITAL_COMMUNITY): Payer: Self-pay | Admitting: Psychiatry

## 2016-03-12 DIAGNOSIS — F331 Major depressive disorder, recurrent, moderate: Secondary | ICD-10-CM

## 2016-03-13 ENCOUNTER — Other Ambulatory Visit (HOSPITAL_COMMUNITY): Payer: Self-pay

## 2016-03-13 MED ORDER — LORAZEPAM 1 MG PO TABS: 1.0000 mg | ORAL_TABLET | Freq: Every day | ORAL | 0 refills | Status: DC

## 2016-03-16 ENCOUNTER — Other Ambulatory Visit (HOSPITAL_COMMUNITY): Payer: Self-pay | Admitting: Psychiatry

## 2016-03-16 DIAGNOSIS — F331 Major depressive disorder, recurrent, moderate: Secondary | ICD-10-CM

## 2016-03-31 ENCOUNTER — Ambulatory Visit (INDEPENDENT_AMBULATORY_CARE_PROVIDER_SITE_OTHER): Payer: Medicare Other | Admitting: Psychiatry

## 2016-03-31 ENCOUNTER — Encounter (HOSPITAL_COMMUNITY): Payer: Self-pay | Admitting: Psychiatry

## 2016-03-31 DIAGNOSIS — Z79899 Other long term (current) drug therapy: Secondary | ICD-10-CM | POA: Diagnosis not present

## 2016-03-31 DIAGNOSIS — F331 Major depressive disorder, recurrent, moderate: Secondary | ICD-10-CM

## 2016-03-31 MED ORDER — TRAZODONE HCL 50 MG PO TABS: 50.0000 mg | ORAL_TABLET | Freq: Every day | ORAL | 2 refills | Status: DC

## 2016-03-31 MED ORDER — LORAZEPAM 1 MG PO TABS: 1.0000 mg | ORAL_TABLET | Freq: Every day | ORAL | 2 refills | Status: DC

## 2016-03-31 MED ORDER — LAMOTRIGINE 150 MG PO TABS: 150.0000 mg | ORAL_TABLET | Freq: Every day | ORAL | 2 refills | Status: DC

## 2016-03-31 MED ORDER — TRINTELLIX 20 MG PO TABS: 20.0000 mg | ORAL_TABLET | Freq: Every day | ORAL | 2 refills | Status: DC

## 2016-03-31 NOTE — Progress Notes (Signed)
[place] progress Note  [name] [id number] 70 y.o.  03/31/2016 2:25 PM  Chief Complaint:  I still feel anxious and depressed.  I like Lamictal but I think it needs to go.  I'm sleeping better with trazodone.     History of Present Illness:  [name] came for her appointment.  On her last visit we increase Lamictal to 100.  She is tolerating medication reported no side effects.  She continues to have episodes of crying spells, feeling hopelessness and excessive sadness.  She had a good Thanksgiving.  She is taking lorazepam and trazodone together at bedtime which is helping her sleep.  Though she denies any suicidal thoughts but endorsed some time fatigue, lack of motivation to do things and crying for no reason.  She has no rash or itching.  She denies any paranoia or any hallucination.  Her energy level is fair.  Her appetite is okay.  She is not drinking or using any illegal substances.  Her primary care physician is Dr. Rogue Jury in [place].  Patient lives with her husband who is very supportive.  She is hoping to have a Christmas at [facility] where her family members are coming.  She is somewhat anxious but also excited.  Suicidal Ideation: No Plan Formed: No Patient has means to carry out plan: No  Homicidal Ideation: No Plan Formed: No Patient has means to carry out plan: No  Past Psychiatric History/Hospitalization(s): Patient has history of depression since age 55 and she had suicidal attempt at that time when she took overdose on medication.  Patient has at least 2 psychiatric hospitalization.  Patient endorse history of anger issues and irritability.  She denies any history of mania but endorsed history of impulsive behavior.  She remember taking Concerta, Wellbutrin, Prozac, lithium, Effexor, Lexapro, Abilify and Cymbalta.  She remember the best treatment when she had given a female hormone to help endometriosis.  In 2016 she has history of ECT  treatment which was failed and then she did [place] which helped her.  She remember Abilify lithium and Wellbutrin did not work.  Patient denies any history of psychosis, hallucination, nightmares or flashback.  She denies any history of physical sexual verbal abuse. Anxiety: No Bipolar Disorder: No Depression: Yes Mania: No Psychosis: No Schizophrenia: No Personality Disorder: No Hospitalization for psychiatric illness: Yes History of Electroconvulsive Shock Therapy: Yes Prior Suicide Attempts: Yes  Family History; Patient endorse a brother committed suicide .  She mentioned he has a history of alcoholism.    Medical History; Patient see Dr. Tiburcio Bash in [place].  She has hypothyroidism, sleep apnea and restless leg   Review of Systems  HENT: Negative.   Respiratory: Negative.   Cardiovascular: Negative.   Musculoskeletal: Negative.   Skin: Negative.  Negative for itching and rash.  Neurological: Negative.  Negative for dizziness and headaches.  Psychiatric/Behavioral: Positive for depression. The patient is nervous/anxious. The patient does not have insomnia.     Psychiatric: Agitation: No Hallucination: No Depressed Mood: Yes Insomnia: No Hypersomnia: No Altered Concentration: No Feels Worthless: No Grandiose Ideas: No Belief In Special Powers: No New/Increased Substance Abuse: No Compulsions: No  Neurologic: Headache: No Seizure: No Paresthesias: No   Outpatient Encounter Prescriptions as of 03/31/2016  Medication Sig  . estradiol (ESTRACE) 0.5 MG tablet Take 1 tablet (0.5 mg total) by mouth daily.  Mar[place] Kitchen lamoTRIgine (LAMICTAL) 150 MG tablet Take 1 tablet (150 mg total) by mouth daily.  Marland Kitchen levothyroxine (SYNTHROID, LEVOTHROID)  88 MCG tablet   . LORazepam (ATIVAN) 1 MG tablet Take 1 tablet (1 mg total) by mouth daily.  Mar[place] Kitchen omeprazole (PRILOSEC) 40 MG capsule   . thyroid (ARMOUR) 30 MG tablet Take 30 mg by mouth daily before breakfast.  . traZODone (DESYREL) 50 MG  tablet Take 1 tablet (50 mg total) by mouth at bedtime.  . TRINTELLIX 20 MG TABS Take 20 mg by mouth daily.  . [DISCONTINUED] lamoTRIgine (LAMICTAL) 100 MG tablet TAKE 1 TABLET BY MOUTH DAILY  . [DISCONTINUED] LORazepam (ATIVAN) 1 MG tablet Take 1 tablet (1 mg total) by mouth daily.  . [DISCONTINUED] traZODone (DESYREL) 50 MG tablet TAKE 1/2-1 TABLET BY MOUTH AT BEDTIME  . [DISCONTINUED] TRINTELLIX 20 MG TABS Take 20 mg by mouth daily.   No facility-administered encounter medications on file as of 03/31/2016.     No results found for this or any previous visit (from the past 2160 hour(s)).    Constitutional:  BP 114/70 (BP Location: Left Arm, Patient Position: Sitting, Cuff Size: Normal)   Pulse 74   Ht 5\' 2"  (1.575 m)   Wt 179 lb (81.2 kg)   LMP  (LMP Unknown)   BMI 32.74 kg/m    Musculoskeletal: Strength & Muscle Tone: within normal limits Gait & Station: normal Patient leans: N/A  Psychiatric Specialty Exam: General Appearance: Casual and Fairly Groomed  Engineer, water::  Good  Speech:  Slow  Volume:  Decreased  Mood:  Dysphoric  Affect:  Appropriate and Congruent  Thought Process:  Intact  Orientation:  Full (Time, Place, and Person)  Thought Content:  WDL and Logical  Suicidal Thoughts:  No  Homicidal Thoughts:  No  Memory:  Immediate;   Good Recent;   Good Remote;   Fair  Judgement:  Good  Insight:  Good  Psychomotor Activity:  Normal  Concentration:  Fair  Recall:  Good  Fund of Knowledge:  Fair  Language:  Fair  Akathisia:  No  Handed:  Right  AIMS (if indicated):     Assets:  Communication Skills Desire for Improvement Financial Resources/Insurance Housing Physical Health Social Support Talents/Skills Transportation  ADL's:  Intact  Cognition:  WNL  Sleep:        Established Problem, Stable/Improving (1), Review of Psycho-Social Stressors (1), Established Problem, Worsening (2), Review of Last Therapy Session (1), Review of Medication Regimen  & Side Effects (2) and Review of New Medication or Change in Dosage (2)  Assessment: Axis I: Major depressive disorder, recurrent moderate, anxiety disorder NOS  Axis II: Deferred  Axis III:  Past Medical History:  Diagnosis Date  . GERD (gastroesophageal reflux disease)   . Hypothyroidism   . Major depressive disorder, recurrent episode, mild ([place])   . Sleep apnea      Plan:  Patient continues to have symptoms of depression and anxiety.  I would increase Lamictal 150 mg daily.  She has no tremors shakes or any EPS.  She has no rash or itching.  I will also continue no Ativan 1 mg at bedtime and trazodone 50 mg at bedtime.  She does not want to change her antidepressant and we will continue Trintellix 20 mg daily.  Encouraged to continue counseling with Th[facility] Aqu[facility].  I also recommended if symptoms do not improve and she should call us in few weeks.  Follow-up in 3 months.Discussed medication side effects and benefits.  Recommended to call us back if there is any question, concern or worsening of the symptoms.  Discuss safety  plan that anytime having active suicidal thoughts or homicidal thoughts and she need to call 911 or go to the local emergency room.   Al Gagen T., MD 03/31/2016

## 2016-04-01 DIAGNOSIS — R0602 Shortness of breath: Secondary | ICD-10-CM | POA: Diagnosis not present

## 2016-04-01 DIAGNOSIS — G4733 Obstructive sleep apnea (adult) (pediatric): Secondary | ICD-10-CM | POA: Diagnosis not present

## 2016-04-01 DIAGNOSIS — F3341 Major depressive disorder, recurrent, in partial remission: Secondary | ICD-10-CM | POA: Diagnosis not present

## 2016-04-07 ENCOUNTER — Ambulatory Visit (HOSPITAL_COMMUNITY): Payer: Self-pay | Admitting: Clinical

## 2016-04-07 DIAGNOSIS — G4733 Obstructive sleep apnea (adult) (pediatric): Secondary | ICD-10-CM | POA: Diagnosis not present

## 2016-04-20 DIAGNOSIS — R0602 Shortness of breath: Secondary | ICD-10-CM | POA: Diagnosis not present

## 2016-04-20 DIAGNOSIS — F3341 Major depressive disorder, recurrent, in partial remission: Secondary | ICD-10-CM | POA: Diagnosis not present

## 2016-04-20 DIAGNOSIS — J309 Allergic rhinitis, unspecified: Secondary | ICD-10-CM | POA: Diagnosis not present

## 2016-04-20 DIAGNOSIS — G4733 Obstructive sleep apnea (adult) (pediatric): Secondary | ICD-10-CM | POA: Diagnosis not present

## 2016-05-12 ENCOUNTER — Ambulatory Visit (HOSPITAL_COMMUNITY): Payer: Self-pay | Admitting: Clinical

## 2016-06-01 DIAGNOSIS — J301 Allergic rhinitis due to pollen: Secondary | ICD-10-CM | POA: Diagnosis not present

## 2016-07-01 ENCOUNTER — Ambulatory Visit (INDEPENDENT_AMBULATORY_CARE_PROVIDER_SITE_OTHER): Payer: Medicare Other | Admitting: Psychiatry

## 2016-07-01 ENCOUNTER — Encounter (HOSPITAL_COMMUNITY): Payer: Self-pay | Admitting: Psychiatry

## 2016-07-01 DIAGNOSIS — Z818 Family history of other mental and behavioral disorders: Secondary | ICD-10-CM

## 2016-07-01 DIAGNOSIS — Z888 Allergy status to other drugs, medicaments and biological substances status: Secondary | ICD-10-CM

## 2016-07-01 DIAGNOSIS — F331 Major depressive disorder, recurrent, moderate: Secondary | ICD-10-CM | POA: Diagnosis not present

## 2016-07-01 DIAGNOSIS — Z811 Family history of alcohol abuse and dependence: Secondary | ICD-10-CM

## 2016-07-01 DIAGNOSIS — Z79899 Other long term (current) drug therapy: Secondary | ICD-10-CM

## 2016-07-01 DIAGNOSIS — Z9049 Acquired absence of other specified parts of digestive tract: Secondary | ICD-10-CM | POA: Diagnosis not present

## 2016-07-01 DIAGNOSIS — Z90710 Acquired absence of both cervix and uterus: Secondary | ICD-10-CM

## 2016-07-01 MED ORDER — TRINTELLIX 20 MG PO TABS: 20.0000 mg | ORAL_TABLET | Freq: Every day | ORAL | 2 refills | Status: DC

## 2016-07-01 MED ORDER — TRAZODONE HCL 50 MG PO TABS: 50.0000 mg | ORAL_TABLET | Freq: Every day | ORAL | 2 refills | Status: DC

## 2016-07-01 MED ORDER — LAMOTRIGINE 200 MG PO TABS: 200.0000 mg | ORAL_TABLET | Freq: Every day | ORAL | 2 refills | Status: DC

## 2016-07-01 MED ORDER — LORAZEPAM 1 MG PO TABS: 1.0000 mg | ORAL_TABLET | Freq: Every day | ORAL | 2 refills | Status: DC

## 2016-07-01 NOTE — Progress Notes (Signed)
[facility] MD/PA/NP OP Progress Note  07/01/2016 1:10 PM [name]  MRN:  [id number]  Chief Complaint:  Subjective:  I was doing good with increase Lamictal but I started to feel down again.  In the morning I am irritable but am sleeping better.  HPI: [name] came for her follow-up appointment.  She is taking Lamictal 150 mg which was increased on her last visit.  She admitted in the beginning she feels good with increase Lamictal but lately she is feeling down again.  She is not sure what is causing her depression but she is noticed more irritable and frustrated in the morning.  She sleeping good.  She denies any suicidal thoughts or homicidal thought but admitted some time fatigue and lack of motivation to do things.  Her energy level is fair.  Her appetite is okay.  Patient denies drinking alcohol or using any illegal substances.  She recently seen her primary care physician Dr. Rogue Jury in [place] but there were no changes in her medication.  She lives with her husband is very supportive.  Her vital signs are stable.  Visit Diagnosis:    ICD-9-CM ICD-10-CM   1. Moderate episode of recurrent major depressive disorder (HCC) 296.32 F33.1 TRINTELLIX 20 MG TABS     traZODone (DESYREL) 50 MG tablet     LORazepam (ATIVAN) 1 MG tablet     lamoTRIgine (LAMICTAL) 200 MG tablet    Past Psychiatric History: Reviewed. Patient has history of depression since age 71 and she had suicidal attempt at that time she took overdose on medication.  Patient has at least 2 psychiatric hospitalization.  Patient endorse history of anger issues and irritability.  She denies any history of mania but endorsed history of impulsive behavior.  She remember taking Concerta, Wellbutrin, Prozac, lithium, Effexor, Lexapro, Abilify and Cymbalta.  She remember the best treatment when she had given a female hormone to help endometriosis.  In 2016 she has history of ECT treatment which was failed and then she did [place] which helped  her.  She remember Abilify lithium and Wellbutrin did not work.  Patient denies any history of psychosis, hallucination, nightmares or flashback.  She denies any history of physical sexual verbal abuse.  Past Medical History:  Past Medical History:  Diagnosis Date  . GERD (gastroesophageal reflux disease)   . Hypothyroidism   . Major depressive disorder, recurrent episode, mild ([place])   . Sleep apnea     Past Surgical History:  Procedure Laterality Date  . ABDOMINAL HYSTERECTOMY    . CHOLECYSTECTOMY    . NO PAST SURGERIES      Family Psychiatric History: Reviewed.  Family History:  Family History  Problem Relation Age of Onset  . Bipolar disorder Neg Hx   . Depression Brother   . Alcohol abuse Brother   . Suicidality Brother   . Stroke Mother   . Neuropathy Mother     Social History:  Social History   Social History  . Marital status: Married    Spouse name: N/A  . Number of children: N/A  . Years of education: N/A   Social History Main Topics  . Smoking status: Never Smoker  . Smokeless tobacco: Never Used  . Alcohol use 4.2 oz/week    7 Shots of liquor per week     Comment: 1 drink per night  . Drug use: No  . Sexual activity: Yes    Partners: Male    Birth control/ protection: None   Other  Topics Concern  . Not on file   Social History Narrative  . No narrative on file    Allergies:  Allergies  Allergen Reactions  . Codeine     Nausea    Metabolic Disorder Labs: No results found for: HGBA1C, MPG No results found for: PROLACTIN No results found for: CHOL, TRIG, HDL, CHOLHDL, VLDL, LDLCALC   Current Medications: Current Outpatient Prescriptions  Medication Sig Dispense Refill  . estradiol (ESTRACE) 0.5 MG tablet Take 1 tablet (0.5 mg total) by mouth daily. 30 tablet 0  . lamoTRIgine (LAMICTAL) 150 MG tablet Take 1 tablet (150 mg total) by mouth daily. 30 tablet 2  . levothyroxine (SYNTHROID, LEVOTHROID) 88 MCG tablet   2  . LORazepam (ATIVAN)  1 MG tablet Take 1 tablet (1 mg total) by mouth daily. 30 tablet 2  . omeprazole (PRILOSEC) 40 MG capsule   1  . thyroid (ARMOUR) 30 MG tablet Take 30 mg by mouth daily before breakfast.    . traZODone (DESYREL) 50 MG tablet Take 1 tablet (50 mg total) by mouth at bedtime. 30 tablet 2  . TRINTELLIX 20 MG TABS Take 20 mg by mouth daily. 30 tablet 2   No current facility-administered medications for this visit.     Neurologic: Headache: No Seizure: No Paresthesias: No  Musculoskeletal: Strength & Muscle Tone: within normal limits Gait & Station: normal Patient leans: N/A  Psychiatric Specialty Exam: Review of Systems  Constitutional:       Feels tired  HENT: Negative.   Musculoskeletal: Negative.   Skin: Negative.  Negative for itching and rash.  Neurological: Negative.   Psychiatric/Behavioral: Positive for depression.    Blood pressure 130/76, pulse 83, height 5\' 2"  (1.575 m), weight 170 lb 6.4 oz (77.3 kg).There is no height or weight on file to calculate BMI.  General Appearance: Casual  Eye Contact:  Good  Speech:  Clear and Coherent  Volume:  Normal  Mood:  Anxious and Depressed  Affect:  Congruent  Thought Process:  Goal Directed  Orientation:  Full (Time, Place, and Person)  Thought Content: WDL and Logical   Suicidal Thoughts:  No  Homicidal Thoughts:  No  Memory:  Immediate;   Good Recent;   Good Remote;   Good  Judgement:  Good  Insight:  Good  Psychomotor Activity:  Normal  Concentration:  Concentration: Good and Attention Span: Fair  Recall:  Good  Fund of Knowledge: Good  Language: Good  Akathisia:  No  Handed:  Right  AIMS (if indicated):  0  Assets:  Communication Skills Desire for Improvement Housing Resilience Social Support  ADL's:  Intact  Cognition: WNL  Sleep:  good   Assessment: Major depressive disorder, recurrent.  Anxiety disorder NOS  Plan: Discuss medication side effects and benefits.  Recommended to increase Lamictal 200 mg  daily.  She has no rash, itching or any other concerns.  Continue lorazepam 1 mg at bedtime and trazodone 50 mg at bedtime.  She does not want to switch her antidepressant like to continue Trintellex 20 mg daily.  She is not interested in counseling.  I recommended if symptoms do not improve then she should call us in few weeks and we will consider TMS maintenance treatment.  In the past she had a good response with [facility].  Recommended to call us back if she has any question or any concern.  Follow-up in 3 months.  Johntavious Francom T., MD 07/01/2016, 1:10 PM

## 2016-07-21 DIAGNOSIS — R062 Wheezing: Secondary | ICD-10-CM | POA: Diagnosis not present

## 2016-07-21 DIAGNOSIS — J45991 Cough variant asthma: Secondary | ICD-10-CM | POA: Diagnosis not present

## 2016-08-07 DIAGNOSIS — J209 Acute bronchitis, unspecified: Secondary | ICD-10-CM | POA: Diagnosis not present

## 2016-08-13 DIAGNOSIS — Z961 Presence of intraocular lens: Secondary | ICD-10-CM | POA: Diagnosis not present

## 2016-09-14 DIAGNOSIS — E039 Hypothyroidism, unspecified: Secondary | ICD-10-CM | POA: Diagnosis not present

## 2016-09-14 DIAGNOSIS — J029 Acute pharyngitis, unspecified: Secondary | ICD-10-CM | POA: Diagnosis not present

## 2016-09-14 DIAGNOSIS — N959 Unspecified menopausal and perimenopausal disorder: Secondary | ICD-10-CM | POA: Diagnosis not present

## 2016-09-14 DIAGNOSIS — J309 Allergic rhinitis, unspecified: Secondary | ICD-10-CM | POA: Diagnosis not present

## 2016-09-25 ENCOUNTER — Other Ambulatory Visit (HOSPITAL_COMMUNITY): Payer: Self-pay | Admitting: Psychiatry

## 2016-09-25 DIAGNOSIS — F331 Major depressive disorder, recurrent, moderate: Secondary | ICD-10-CM

## 2016-09-27 ENCOUNTER — Other Ambulatory Visit (HOSPITAL_COMMUNITY): Payer: Self-pay | Admitting: Psychiatry

## 2016-09-27 DIAGNOSIS — F331 Major depressive disorder, recurrent, moderate: Secondary | ICD-10-CM

## 2016-09-29 ENCOUNTER — Encounter (HOSPITAL_COMMUNITY): Payer: Self-pay | Admitting: Psychiatry

## 2016-09-29 ENCOUNTER — Ambulatory Visit (INDEPENDENT_AMBULATORY_CARE_PROVIDER_SITE_OTHER): Payer: Medicare Other | Admitting: Psychiatry

## 2016-09-29 VITALS — BP 118/70 | HR 83 | Ht 62.0 in | Wt 182.6 lb

## 2016-09-29 DIAGNOSIS — F331 Major depressive disorder, recurrent, moderate: Secondary | ICD-10-CM | POA: Diagnosis not present

## 2016-09-29 DIAGNOSIS — Z818 Family history of other mental and behavioral disorders: Secondary | ICD-10-CM | POA: Diagnosis not present

## 2016-09-29 DIAGNOSIS — F900 Attention-deficit hyperactivity disorder, predominantly inattentive type: Secondary | ICD-10-CM

## 2016-09-29 DIAGNOSIS — Z811 Family history of alcohol abuse and dependence: Secondary | ICD-10-CM

## 2016-09-29 MED ORDER — LORAZEPAM 1 MG PO TABS: 1.0000 mg | ORAL_TABLET | Freq: Every day | ORAL | 2 refills | Status: DC

## 2016-09-29 MED ORDER — LISDEXAMFETAMINE DIMESYLATE 30 MG PO CAPS: 30.0000 mg | ORAL_CAPSULE | Freq: Every day | ORAL | 0 refills | Status: DC

## 2016-09-29 MED ORDER — TRINTELLIX 20 MG PO TABS: 20.0000 mg | ORAL_TABLET | Freq: Every day | ORAL | 2 refills | Status: DC

## 2016-09-29 MED ORDER — TRAZODONE HCL 50 MG PO TABS: 50.0000 mg | ORAL_TABLET | Freq: Every day | ORAL | 2 refills | Status: DC

## 2016-09-29 MED ORDER — LAMOTRIGINE 200 MG PO TABS: 200.0000 mg | ORAL_TABLET | Freq: Every day | ORAL | 2 refills | Status: DC

## 2016-09-29 NOTE — Progress Notes (Signed)
[facility] MD/PA/NP OP Progress Note  09/29/2016 3:20 PM [name]  MRN:  [id number]  Chief Complaint:  Subjective:  I have no energy.  I'm sleeping too much.  I gained weight.  HPI: [name] came for her follow-up appointment.  She is taking Lamictal which was increased on her last visit.  She reported her depression is okay but she has no energy and she is feeling very tired and sleeping too much.  In the past she was diagnosed with ADD and she had leftover Adderall which she took but she did not like the effect because it was make her more anxious and did not last long.  She admitted having binge eating and she is very frustrated with the weight.  Though she denies any suicidal thoughts or homicidal thought but endorsed lack of energy, multitasking, attention and concentration.  She scheduled to see her primary care physician next month and she will get blood work.  She is taking multiple medication including trazodone, lorazepam, Lamictal and Trintellix.  She has no tremors or shakes.  She lives with her husband who is supportive.  Patient denies drinking alcohol or using any illegal substances.  Visit Diagnosis:    ICD-9-CM ICD-10-CM   1. Attention deficit hyperactivity disorder (ADHD), predominantly inattentive type 314.00 F90.0 lisdexamfetamine (VYVANSE) 30 MG capsule  2. Moderate episode of recurrent major depressive disorder (HCC) 296.32 F33.1 TRINTELLIX 20 MG TABS     traZODone (DESYREL) 50 MG tablet     LORazepam (ATIVAN) 1 MG tablet     lamoTRIgine (LAMICTAL) 200 MG tablet     lisdexamfetamine (VYVANSE) 30 MG capsule    Past Psychiatric History: Reviewed. Patient has history of depression since age 10 and she had suicidal attempt at that time she took overdose on medication. Patient has at least 2 psychiatric hospitalization. Patient endorse history of anger issues and irritability. She denies any history of mania but endorsed history of impulsive behavior.  She was diagnosed with ADD  and she had psychological testing in the past.She remember taking Concerta, Adderall , Wellbutrin, Prozac, lithium, Effexor, Lexapro, Abilify and Cymbalta. She remember the best treatment when she had given a female hormone to help endometriosis. In 2016 she has history of ECT treatment which was failed and then she did [place] which helped her. She remember Abilify lithium and Wellbutrin did not work. Patient denies any history of psychosis, hallucination, nightmares or flashback. She denies any history of physical sexual verbal abuse.  Past Medical History:  Past Medical History:  Diagnosis Date  . GERD (gastroesophageal reflux disease)   . Hypothyroidism   . Major depressive disorder, recurrent episode, mild ([place])   . Sleep apnea     Past Surgical History:  Procedure Laterality Date  . ABDOMINAL HYSTERECTOMY    . CHOLECYSTECTOMY    . NO PAST SURGERIES      Family Psychiatric History: Reviewed.  Family History:  Family History  Problem Relation Age of Onset  . Depression Brother   . Alcohol abuse Brother   . Suicidality Brother   . Stroke Mother   . Neuropathy Mother   . Bipolar disorder Neg Hx     Social History:  Social History   Social History  . Marital status: Married    Spouse name: N/A  . Number of children: N/A  . Years of education: N/A   Social History Main Topics  . Smoking status: Never Smoker  . Smokeless tobacco: Never Used  . Alcohol use 4.2 oz/week  7 Shots of liquor per week     Comment: 1 drink per night  . Drug use: No  . Sexual activity: Yes    Partners: Male    Birth control/ protection: None   Other Topics Concern  . None   Social History Narrative  . None    Allergies:  Allergies  Allergen Reactions  . Codeine     Nausea    Metabolic Disorder Labs: No results found for: HGBA1C, MPG No results found for: PROLACTIN No results found for: CHOL, TRIG, HDL, CHOLHDL, VLDL, LDLCALC   Current Medications: Current Outpatient  Prescriptions  Medication Sig Dispense Refill  . estradiol (ESTRACE) 0.5 MG tablet Take 1 tablet (0.5 mg total) by mouth daily. 30 tablet 0  . lamoTRIgine (LAMICTAL) 200 MG tablet Take 1 tablet (200 mg total) by mouth daily. 30 tablet 2  . levothyroxine (SYNTHROID, LEVOTHROID) 88 MCG tablet   2  . LORazepam (ATIVAN) 1 MG tablet Take 1 tablet (1 mg total) by mouth daily. 30 tablet 2  . omeprazole (PRILOSEC) 40 MG capsule   1  . thyroid (ARMOUR) 30 MG tablet Take 30 mg by mouth daily before breakfast.    . traZODone (DESYREL) 50 MG tablet Take 1 tablet (50 mg total) by mouth at bedtime. 30 tablet 2  . TRINTELLIX 20 MG TABS Take 20 mg by mouth daily. 30 tablet 2   No current facility-administered medications for this visit.     Neurologic: Headache: No Seizure: No Paresthesias: No  Musculoskeletal: Strength & Muscle Tone: within normal limits Gait & Station: normal Patient leans: N/A  Psychiatric Specialty Exam: Review of Systems  Constitutional: Positive for malaise/fatigue. Negative for weight loss.  HENT: Negative.   Eyes: Negative.   Respiratory: Negative.   Cardiovascular: Negative.   Gastrointestinal: Negative.   Genitourinary: Negative.   Musculoskeletal: Negative.   Skin: Negative.   Neurological: Negative.   Psychiatric/Behavioral: Positive for depression.    Blood pressure 118/70, pulse 83, height 5\' 2"  (1.575 m), weight 182 lb 9.6 oz (82.8 kg).Body mass index is 33.4 kg/m.  General Appearance: Casual  Eye Contact:  Fair  Speech:  Slow  Volume:  Normal  Mood:  Depressed  Affect:  Constricted  Thought Process:  Goal Directed  Orientation:  Full (Time, Place, and Person)  Thought Content: Rumination   Suicidal Thoughts:  No  Homicidal Thoughts:  No  Memory:  Immediate;   Good Recent;   Good Remote;   Fair  Judgement:  Good  Insight:  Good  Psychomotor Activity:  Decreased  Concentration:  Concentration: Fair and Attention Span: Fair  Recall:  [place]  of Knowledge: Good  Language: Good  Akathisia:  No  Handed:  Right  AIMS (if indicated):  0  Assets:  Communication Skills Desire for Improvement Housing Physical Health Resilience Social Support  ADL's:  Intact  Cognition: WNL  Sleep:  Too much     Assessment: Major depressive disorder, recurrent.  ADD inattentive type  Plan: I review her history, symptoms, current medication and response to the medication.  Recommended not to take Adderall since it does not help and cause worsening of anxiety.  She had never tried Vyvanse, we will recommended to try Vyvanse to help her attention, concentration, binge eating and ADD symptoms.  It may also help her weight loss.  Patient does not want to change her other psychiatric medication.  I will continue lorazepam 1 mg at bedtime, trazodone 50 mg at bedtime,  Lamictal 200 mg daily and Trintellix 20 mg daily.  Discussed medication side effects specialty benzodiazepine dependence tolerance and withdrawal and the stimulant abuse.  If patient do not see improvement we will consider TMS maintenance treatment.  Patient is not interested in counseling.  I also recommended to have her blood work results faxed to [name].  Time spent 25 minutes.  More than 50% of the time spent in psychoeducation, counseling and coordination of care.  Follow-up in 4-6 weeks.  Anwitha Mapes T., MD 09/29/2016, 3:20 PM

## 2016-10-02 DIAGNOSIS — I10 Essential (primary) hypertension: Secondary | ICD-10-CM | POA: Diagnosis not present

## 2016-10-02 DIAGNOSIS — Z0001 Encounter for general adult medical examination with abnormal findings: Secondary | ICD-10-CM | POA: Diagnosis not present

## 2016-10-02 DIAGNOSIS — E039 Hypothyroidism, unspecified: Secondary | ICD-10-CM | POA: Diagnosis not present

## 2016-10-02 DIAGNOSIS — E559 Vitamin D deficiency, unspecified: Secondary | ICD-10-CM | POA: Diagnosis not present

## 2016-10-22 ENCOUNTER — Encounter (HOSPITAL_COMMUNITY): Payer: Self-pay | Admitting: Psychiatry

## 2016-10-22 ENCOUNTER — Ambulatory Visit (INDEPENDENT_AMBULATORY_CARE_PROVIDER_SITE_OTHER): Payer: Medicare Other | Admitting: Psychiatry

## 2016-10-22 VITALS — BP 112/68 | HR 87 | Ht 62.5 in | Wt 183.0 lb

## 2016-10-22 DIAGNOSIS — F332 Major depressive disorder, recurrent severe without psychotic features: Secondary | ICD-10-CM | POA: Diagnosis not present

## 2016-10-22 DIAGNOSIS — Z811 Family history of alcohol abuse and dependence: Secondary | ICD-10-CM

## 2016-10-22 DIAGNOSIS — F900 Attention-deficit hyperactivity disorder, predominantly inattentive type: Secondary | ICD-10-CM | POA: Diagnosis not present

## 2016-10-22 DIAGNOSIS — Z818 Family history of other mental and behavioral disorders: Secondary | ICD-10-CM

## 2016-10-22 MED ORDER — METHYLPHENIDATE HCL 5 MG PO TABS: 5.0000 mg | ORAL_TABLET | Freq: Every day | ORAL | 0 refills | Status: DC

## 2016-10-22 NOTE — Progress Notes (Signed)
School

## 2016-10-22 NOTE — Progress Notes (Signed)
[name] OP Progress Note  10/22/2016 2:33 PM [name]  MRN:  [id number]  Chief Complaint:  Chief Complaint    Follow-up     Subjective:  I tried Vyvanse but it makes me anxious.  I still feel depressed.  HPI: [name] came for her follow-up appointment.  On her last visit we started low-dose Vyvanse because she feels Adderall makes her more anxious and does not help sleep.  She took few days while is 30 mg but started to have anxiety and nervousness.  She also difficult to afford Vyvanse because it is expensive.  She still have binge eating but her weight has been stable.  She continued to endorse chronic depression with lack of energy, difficulty multitasking, fatigue and lack of attention concentration.  She is taking Lamictal, lorazepam, trazodone and Trintellix.  She has no tremors or shakes.  We do see blood work from her primary care physician it was done on June 2.  Her glucoses 100.  Her basic chemistry including liver function test is normal.  Her cholesterol is slightly high.  Her total cholesterol is 169.  Her TSH is 0.049 which is low.  Patient denies any hallucination, paranoia but continued to feel sad depressed and withdrawn.  She lives with her husband is supportive.  Patient denies drinking alcohol or using any illegal substances.  She is thinking to consider [place] which helped her in the past.  Visit Diagnosis:    ICD-10-CM   1. Attention deficit hyperactivity disorder (ADHD), predominantly inattentive type F90.0 methylphenidate (RITALIN) 5 MG tablet    DISCONTINUED: methylphenidate (RITALIN) 5 MG tablet    Past Psychiatric History: Reviewed. Patient has history of depression since age 24 when she took overdose on medication.Patient has at least 2 psychiatric hospitalization. Patient endorse history of anger issues and irritability. She denies any history of mania but reported history of impulsive behavior.  She was psychological testing and diagnosed with ADD. She  remember taking Concerta, Adderall , Wellbutrin, Prozac, lithium, Effexor, Lexapro, Abilify and Cymbalta. She remember the best treatment when she had given a female hormone to help endometriosis. In 2016 she has history of ECT treatment which was failed and then she did [place] which helped her. She remember Abilify lithium and Wellbutrin did not work. Recently we tried Vyvanse because Adderall making her more anxious.  Patient denies any history of psychosis, hallucination, nightmares or flashback. She denies any history of physical sexual verbal abuse.  Past Medical History:  Past Medical History:  Diagnosis Date  . GERD (gastroesophageal reflux disease)   . Hypothyroidism   . Major depressive disorder, recurrent episode, mild ([place])   . Sleep apnea     Past Surgical History:  Procedure Laterality Date  . ABDOMINAL HYSTERECTOMY    . CHOLECYSTECTOMY    . NO PAST SURGERIES      Family Psychiatric History: Reviewed.  Family History:  Family History  Problem Relation Age of Onset  . Depression Brother   . Alcohol abuse Brother   . Suicidality Brother   . Stroke Mother   . Neuropathy Mother   . Bipolar disorder Neg Hx     Social History:  Social History   Social History  . Marital status: Married    Spouse name: [name]  . Number of children: [name]  . Years of education: [name]   Social History Main Topics  . Smoking status: Never Smoker  . Smokeless tobacco: Never Used  . Alcohol use 4.2 oz/week  7 Shots of liquor per week     Comment: 1 drink per night  . Drug use: No  . Sexual activity: Yes    Partners: Male    Birth control/ protection: None   Other Topics Concern  . None   Social History Narrative  . None    Allergies:  Allergies  Allergen Reactions  . Codeine     Nausea    Metabolic Disorder Labs: No results found for: HGBA1C, MPG No results found for: PROLACTIN No results found for: CHOL, TRIG, HDL, CHOLHDL, VLDL, LDLCALC   Current  Medications: Current Outpatient Prescriptions  Medication Sig Dispense Refill  . amphetamine-dextroamphetamine (ADDERALL) 15 MG tablet Take 7.5 mg by mouth daily as needed.    Marland Kitchen estradiol (ESTRACE) 0.5 MG tablet Take 1 tablet (0.5 mg total) by mouth daily. 30 tablet 0  . lamoTRIgine (LAMICTAL) 200 MG tablet Take 1 tablet (200 mg total) by mouth daily. 30 tablet 2  . levothyroxine (SYNTHROID, LEVOTHROID) 88 MCG tablet   2  . LORazepam (ATIVAN) 1 MG tablet Take 1 tablet (1 mg total) by mouth daily. 30 tablet 2  . omeprazole (PRILOSEC) 40 MG capsule   1  . thyroid (ARMOUR) 30 MG tablet Take 30 mg by mouth daily before breakfast.    . traZODone (DESYREL) 50 MG tablet Take 1 tablet (50 mg total) by mouth at bedtime. 30 tablet 2  . TRINTELLIX 20 MG TABS Take 20 mg by mouth daily. 30 tablet 2  . lisdexamfetamine (VYVANSE) 30 MG capsule Take 1 capsule (30 mg total) by mouth daily. (Patient not taking: Reported on 10/22/2016) 30 capsule 0   No current facility-administered medications for this visit.     Neurologic: Headache: No Seizure: No Paresthesias: No  Musculoskeletal: Strength & Muscle Tone: within normal limits Gait & Station: normal Patient leans: [name]  Psychiatric Specialty Exam: ROS  Blood pressure 112/68, pulse 87, height 5' 2.5" (1.588 m), weight 183 lb (83 kg), SpO2 97 %.Body mass index is 32.94 kg/m.  General Appearance: Casual  Eye Contact:  Fair  Speech:  Slow  Volume:  Normal  Mood:  Anxious and Depressed  Affect:  Congruent  Thought Process:  Goal Directed  Orientation:  Full (Time, Place, and Person)  Thought Content: Logical and Rumination   Suicidal Thoughts:  No  Homicidal Thoughts:  No  Memory:  Immediate;   Good Recent;   Good Remote;   Good  Judgement:  Good  Insight:  Good  Psychomotor Activity:  Normal  Concentration:  Concentration: Fair and Attention Span: Fair  Recall:  [place] of Knowledge: Good  Language: Good  Akathisia:  No  Handed:   Right  AIMS (if indicated):  0  Assets:  Communication Skills Desire for Improvement Housing Resilience Social Support  ADL's:  Intact  Cognition: WNL  Sleep:  Okay     Assessment: Major depressive disorder, recurrent.  Attention deficit disorder, inattentive type  Plan: I will discontinue Vyvanse since patient complaining of increased anxiety and she cannot afford.  Recommended to try low-dose Ritalin to help attention and focus.  She had never tried Ritalin before.  We will try 5 mg.  Discussed medication side effects and benefits.  Continue Lamictal 200 mg daily, trazodone 50 mg at bedtime, lorazepam 1 mg at bedtime and Trintellix 20 mg daily.  She has no rash, itching, tremors or shakes.  I reviewed labwork from her primary care physician.  We will schedule appointment with Dr.  Eskir for [facility] evaluation.  I will see her again in 2 months.  Recommended to call us back if she feels worsening of the symptom or anytime having suicidal thoughts or homicidal thoughts then she need to call 911 or go to the local emergency room.  Iveth Heidemann T., MD 10/22/2016, 2:33 PM

## 2016-10-27 ENCOUNTER — Telehealth (HOSPITAL_COMMUNITY): Payer: Self-pay | Admitting: Psychiatry

## 2016-10-27 NOTE — Telephone Encounter (Signed)
Received TMS consult for patient. Left VM with instructions to call office to schedule a [facility] consult appointment.

## 2016-10-27 NOTE — Telephone Encounter (Signed)
Thank you, Cristie Hem!

## 2016-10-29 DIAGNOSIS — D519 Vitamin B12 deficiency anemia, unspecified: Secondary | ICD-10-CM | POA: Diagnosis not present

## 2016-10-29 DIAGNOSIS — E039 Hypothyroidism, unspecified: Secondary | ICD-10-CM | POA: Diagnosis not present

## 2016-10-29 DIAGNOSIS — D649 Anemia, unspecified: Secondary | ICD-10-CM | POA: Diagnosis not present

## 2016-10-29 DIAGNOSIS — I10 Essential (primary) hypertension: Secondary | ICD-10-CM | POA: Diagnosis not present

## 2016-10-29 DIAGNOSIS — R5383 Other fatigue: Secondary | ICD-10-CM | POA: Diagnosis not present

## 2016-10-29 DIAGNOSIS — F3341 Major depressive disorder, recurrent, in partial remission: Secondary | ICD-10-CM | POA: Diagnosis not present

## 2016-10-29 DIAGNOSIS — K219 Gastro-esophageal reflux disease without esophagitis: Secondary | ICD-10-CM | POA: Diagnosis not present

## 2016-10-29 DIAGNOSIS — E538 Deficiency of other specified B group vitamins: Secondary | ICD-10-CM | POA: Diagnosis not present

## 2016-10-29 DIAGNOSIS — D509 Iron deficiency anemia, unspecified: Secondary | ICD-10-CM | POA: Diagnosis not present

## 2016-10-29 DIAGNOSIS — Z0001 Encounter for general adult medical examination with abnormal findings: Secondary | ICD-10-CM | POA: Diagnosis not present

## 2016-11-10 DIAGNOSIS — D509 Iron deficiency anemia, unspecified: Secondary | ICD-10-CM | POA: Diagnosis not present

## 2016-11-13 ENCOUNTER — Ambulatory Visit (HOSPITAL_COMMUNITY): Payer: Self-pay | Admitting: Psychiatry

## 2016-11-17 DIAGNOSIS — Z1231 Encounter for screening mammogram for malignant neoplasm of breast: Secondary | ICD-10-CM | POA: Diagnosis not present

## 2016-11-18 DIAGNOSIS — D509 Iron deficiency anemia, unspecified: Secondary | ICD-10-CM | POA: Diagnosis not present

## 2016-11-24 DIAGNOSIS — R1012 Left upper quadrant pain: Secondary | ICD-10-CM | POA: Diagnosis not present

## 2016-12-15 ENCOUNTER — Ambulatory Visit: Payer: Self-pay | Admitting: Gastroenterology

## 2016-12-21 ENCOUNTER — Encounter (HOSPITAL_COMMUNITY): Payer: Self-pay | Admitting: Psychiatry

## 2016-12-21 ENCOUNTER — Ambulatory Visit (INDEPENDENT_AMBULATORY_CARE_PROVIDER_SITE_OTHER): Payer: Medicare Other | Admitting: Psychiatry

## 2016-12-21 DIAGNOSIS — Z811 Family history of alcohol abuse and dependence: Secondary | ICD-10-CM | POA: Diagnosis not present

## 2016-12-21 DIAGNOSIS — Z818 Family history of other mental and behavioral disorders: Secondary | ICD-10-CM | POA: Diagnosis not present

## 2016-12-21 DIAGNOSIS — F331 Major depressive disorder, recurrent, moderate: Secondary | ICD-10-CM

## 2016-12-21 DIAGNOSIS — F900 Attention-deficit hyperactivity disorder, predominantly inattentive type: Secondary | ICD-10-CM | POA: Diagnosis not present

## 2016-12-21 MED ORDER — LORAZEPAM 1 MG PO TABS: 1.0000 mg | ORAL_TABLET | Freq: Every day | ORAL | 2 refills | Status: DC

## 2016-12-21 MED ORDER — TRINTELLIX 20 MG PO TABS: 20.0000 mg | ORAL_TABLET | Freq: Every day | ORAL | 2 refills | Status: DC

## 2016-12-21 MED ORDER — TRAZODONE HCL 50 MG PO TABS: 50.0000 mg | ORAL_TABLET | Freq: Every day | ORAL | 2 refills | Status: DC

## 2016-12-21 MED ORDER — LAMOTRIGINE 200 MG PO TABS: 200.0000 mg | ORAL_TABLET | Freq: Every day | ORAL | 2 refills | Status: DC

## 2016-12-21 MED ORDER — METHYLPHENIDATE HCL 5 MG PO TABS: 5.0000 mg | ORAL_TABLET | Freq: Every day | ORAL | 0 refills | Status: DC

## 2016-12-21 NOTE — Progress Notes (Signed)
[facility] MD/PA/NP OP Progress Note  12/21/2016 2:05 PM [name]  MRN:  [id number]  Chief Complaint:  Subjective:  I am doing better.  I did not schedule for TMS because I realize I have anemia and off for the treatment I got better.  HPI: [name] came for her follow-up appointment.  She is doing much better.  She find out that she was suffering from anemia and she was given medication and now she is feeling much better.  Her energy is improved from the past.  She also like Ritalin much better than Vyvanse and Adderall.  She sleeping good.  She denies any crying spells or any feeling of hopelessness or worthlessness.  She is taking Lamictal, lorazepam, trazodone, Trintellix denies any rash, itching, tremors or shakes.  Her her multitasking, attention and focus is much better.  Today she is disappointed because she was hoping to sell her house which is on the market for 2 years but today she got a call from potential buyer that they are not interested.  Patient like to move out and live close with her son in [place].  She is seeing her primary care physician Dr. Humphrey Rolls regularly.  She lives with her husband.  Patient denies drinking alcohol or using any illegal substances.  Her appetite is okay.  Her sleep is good.  She has no tremors or shakes.  Visit Diagnosis:    ICD-10-CM   1. Moderate episode of recurrent major depressive disorder (HCC) F33.1 TRINTELLIX 20 MG TABS    traZODone (DESYREL) 50 MG tablet    LORazepam (ATIVAN) 1 MG tablet    lamoTRIgine (LAMICTAL) 200 MG tablet  2. Attention deficit hyperactivity disorder (ADHD), predominantly inattentive type F90.0 methylphenidate (RITALIN) 5 MG tablet    DISCONTINUED: methylphenidate (RITALIN) 5 MG tablet    DISCONTINUED: methylphenidate (RITALIN) 5 MG tablet    Past Psychiatric History: Reviewed. Patient has history of depression since age 22 when she took overdose on medication.Patient has at least 2 psychiatric hospitalization. Patient  endorse history of anger issues and irritability. She denies any history of mania but reported history of impulsive behavior. She was psychological testing and diagnosed with ADD. She remember taking Concerta, Adderall , Wellbutrin, Prozac, lithium, Effexor, Lexapro, Abilify and Cymbalta. She remember the best treatment when she had given a female hormone to help endometriosis. In 2016 she has history of ECT treatment which was failed and then she did [place] which helped her. She remember Abilify lithium and Wellbutrin did not work. We tried Vyvanse but it makes her more anxious like Adderall did .  Patient denies any history of psychosis, hallucination, nightmares or flashback. She denies any history of physical sexual verbal abuse.  Past Medical History:  Past Medical History:  Diagnosis Date  . GERD (gastroesophageal reflux disease)   . Hypothyroidism   . Major depressive disorder, recurrent episode, mild ([place])   . Sleep apnea     Past Surgical History:  Procedure Laterality Date  . ABDOMINAL HYSTERECTOMY    . CHOLECYSTECTOMY    . NO PAST SURGERIES      Family Psychiatric History: Reviewed.  Family History:  Family History  Problem Relation Age of Onset  . Depression Brother   . Alcohol abuse Brother   . Suicidality Brother   . Stroke Mother   . Neuropathy Mother   . Bipolar disorder Neg Hx     Social History:  Social History   Social History  . Marital status: Married  Spouse name: N/A  . Number of children: N/A  . Years of education: N/A   Social History Main Topics  . Smoking status: Never Smoker  . Smokeless tobacco: Never Used  . Alcohol use 4.2 oz/week    7 Shots of liquor per week     Comment: 1 drink per night  . Drug use: No  . Sexual activity: Yes    Partners: Male    Birth control/ protection: None   Other Topics Concern  . Not on file   Social History Narrative  . No narrative on file    Allergies:  Allergies  Allergen Reactions  .  Codeine     Nausea    Metabolic Disorder Labs: No results found for: HGBA1C, MPG No results found for: PROLACTIN No results found for: CHOL, TRIG, HDL, CHOLHDL, VLDL, LDLCALC   Current Medications: Current Outpatient Prescriptions  Medication Sig Dispense Refill  . estradiol (ESTRACE) 0.5 MG tablet Take 1 tablet (0.5 mg total) by mouth daily. 30 tablet 0  . lamoTRIgine (LAMICTAL) 200 MG tablet Take 1 tablet (200 mg total) by mouth daily. 30 tablet 2  . levothyroxine (SYNTHROID, LEVOTHROID) 88 MCG tablet   2  . LORazepam (ATIVAN) 1 MG tablet Take 1 tablet (1 mg total) by mouth daily. 30 tablet 2  . methylphenidate (RITALIN) 5 MG tablet Take 1 tablet (5 mg total) by mouth daily. 30 tablet 0  . omeprazole (PRILOSEC) 40 MG capsule   1  . thyroid (ARMOUR) 30 MG tablet Take 30 mg by mouth daily before breakfast.    . traZODone (DESYREL) 50 MG tablet Take 1 tablet (50 mg total) by mouth at bedtime. 30 tablet 2  . TRINTELLIX 20 MG TABS Take 20 mg by mouth daily. 30 tablet 2   No current facility-administered medications for this visit.     Neurologic: Headache: No Seizure: No Paresthesias: No  Musculoskeletal: Strength & Muscle Tone: within normal limits Gait & Station: normal Patient leans: N/A  Psychiatric Specialty Exam: ROS  Blood pressure 120/72, pulse 77, height 5\' 2"  (1.575 m), weight 178 lb 3.2 oz (80.8 kg).There is no height or weight on file to calculate BMI.  General Appearance: Casual  Eye Contact:  Good  Speech:  Clear and Coherent  Volume:  Normal  Mood:  Euthymic  Affect:  Appropriate  Thought Process:  Goal Directed  Orientation:  Full (Time, Place, and Person)  Thought Content: Logical   Suicidal Thoughts:  No  Homicidal Thoughts:  No  Memory:  Immediate;   Good Recent;   Good Remote;   Good  Judgement:  Good  Insight:  Good  Psychomotor Activity:  Normal  Concentration:  Concentration: Good and Attention Span: Good  Recall:  Good  Fund of Knowledge:  Good  Language: Good  Akathisia:  No  Handed:  Right  AIMS (if indicated):  0  Assets:  Communication Skills Desire for Improvement Housing Resilience Social Support  ADL's:  Intact  Cognition: WNL  Sleep:  ok    Assessment: Attention deficit disorder, inattentive type.  Major depressive disorder, recurrent.  Plan: Patient is doing much better since she did treatment for the anemia and her energy is improved.  She does not want to change her medication.  She has no concerns.  I will continue Ritalin 5 mg daily, trazodone 50 mg at bedtime, Lamictal 200 mg daily, Trintellix 20 mg daily and lorazepam 1 mg at bedtime.  Discussed medication side effects and benefits.  She is not interested in counseling.  Recommended to call us back if she has any question or any concern.  Follow-up in 3 months.  Eleonora Peeler T., MD 12/21/2016, 2:05 PM

## 2016-12-22 DIAGNOSIS — L821 Other seborrheic keratosis: Secondary | ICD-10-CM | POA: Diagnosis not present

## 2016-12-22 DIAGNOSIS — L298 Other pruritus: Secondary | ICD-10-CM | POA: Diagnosis not present

## 2016-12-22 DIAGNOSIS — L538 Other specified erythematous conditions: Secondary | ICD-10-CM | POA: Diagnosis not present

## 2016-12-22 DIAGNOSIS — L820 Inflamed seborrheic keratosis: Secondary | ICD-10-CM | POA: Diagnosis not present

## 2016-12-22 DIAGNOSIS — D2262 Melanocytic nevi of left upper limb, including shoulder: Secondary | ICD-10-CM | POA: Diagnosis not present

## 2016-12-22 DIAGNOSIS — C44712 Basal cell carcinoma of skin of right lower limb, including hip: Secondary | ICD-10-CM | POA: Diagnosis not present

## 2016-12-22 DIAGNOSIS — D485 Neoplasm of uncertain behavior of skin: Secondary | ICD-10-CM | POA: Diagnosis not present

## 2016-12-24 ENCOUNTER — Encounter: Payer: Self-pay | Admitting: Emergency Medicine

## 2016-12-24 ENCOUNTER — Emergency Department
Admission: EM | Admit: 2016-12-24 | Discharge: 2016-12-24 | Disposition: A | Discharge status: Home / Self Care | Payer: Medicare Other | Attending: Emergency Medicine | Admitting: Emergency Medicine

## 2016-12-24 ENCOUNTER — Emergency Department: Payer: Medicare Other

## 2016-12-24 DIAGNOSIS — E039 Hypothyroidism, unspecified: Secondary | ICD-10-CM | POA: Insufficient documentation

## 2016-12-24 DIAGNOSIS — R1012 Left upper quadrant pain: Secondary | ICD-10-CM | POA: Diagnosis not present

## 2016-12-24 DIAGNOSIS — Z79899 Other long term (current) drug therapy: Secondary | ICD-10-CM | POA: Diagnosis not present

## 2016-12-24 DIAGNOSIS — K5730 Diverticulosis of large intestine without perforation or abscess without bleeding: Secondary | ICD-10-CM | POA: Diagnosis not present

## 2016-12-24 LAB — URINALYSIS, COMPLETE (UACMP) WITH MICROSCOPIC
BILIRUBIN URINE: NEGATIVE
Bacteria, UA: NONE SEEN
GLUCOSE, UA: NEGATIVE mg/dL
Hgb urine dipstick: NEGATIVE
KETONES UR: NEGATIVE mg/dL
LEUKOCYTES UA: NEGATIVE
Nitrite: NEGATIVE
PH: 5 (ref 5.0–8.0)
PROTEIN: NEGATIVE mg/dL
RBC / HPF: NONE SEEN RBC/hpf (ref 0–5)
Specific Gravity, Urine: 1.014 (ref 1.005–1.030)

## 2016-12-24 LAB — CBC
HCT: 31.3 % — ABNORMAL LOW (ref 35.0–47.0)
HEMOGLOBIN: 10.4 g/dL — AB (ref 12.0–16.0)
MCH: 27.5 pg (ref 26.0–34.0)
MCHC: 33.3 g/dL (ref 32.0–36.0)
MCV: 82.4 fL (ref 80.0–100.0)
PLATELETS: 393 10*3/uL (ref 150–440)
RBC: 3.8 MIL/uL (ref 3.80–5.20)
RDW: 19.6 % — ABNORMAL HIGH (ref 11.5–14.5)
WBC: 9.6 10*3/uL (ref 3.6–11.0)

## 2016-12-24 LAB — COMPREHENSIVE METABOLIC PANEL
ALT: 17 U/L (ref 14–54)
ANION GAP: 6 (ref 5–15)
AST: 16 U/L (ref 15–41)
Albumin: 3.8 g/dL (ref 3.5–5.0)
Alkaline Phosphatase: 67 U/L (ref 38–126)
BUN: 12 mg/dL (ref 6–20)
CHLORIDE: 107 mmol/L (ref 101–111)
CO2: 27 mmol/L (ref 22–32)
CREATININE: 0.94 mg/dL (ref 0.44–1.00)
Calcium: 9.1 mg/dL (ref 8.9–10.3)
Glucose, Bld: 104 mg/dL — ABNORMAL HIGH (ref 65–99)
POTASSIUM: 4.1 mmol/L (ref 3.5–5.1)
SODIUM: 140 mmol/L (ref 135–145)
Total Bilirubin: 0.2 mg/dL — ABNORMAL LOW (ref 0.3–1.2)
Total Protein: 6.6 g/dL (ref 6.5–8.1)

## 2016-12-24 LAB — LIPASE, BLOOD: LIPASE: 27 U/L (ref 11–51)

## 2016-12-24 MED ORDER — IOPAMIDOL (ISOVUE-300) INJECTION 61%: 15.0000 mL | INTRAVENOUS | Status: DC

## 2016-12-24 MED ORDER — ONDANSETRON HCL 4 MG/2ML IJ SOLN
4.0000 mg | Freq: Once | INTRAMUSCULAR | Status: AC
  Administered 2016-12-24: 4 mg via INTRAVENOUS
  Filled 2016-12-24: qty 2

## 2016-12-24 MED ORDER — OXYCODONE-ACETAMINOPHEN 5-325 MG PO TABS
1.0000 | ORAL_TABLET | Freq: Once | ORAL | Status: AC
  Administered 2016-12-24: 1 via ORAL
  Filled 2016-12-24: qty 1

## 2016-12-24 MED ORDER — MORPHINE SULFATE (PF) 2 MG/ML IV SOLN
2.0000 mg | Freq: Once | INTRAVENOUS | Status: DC
  Filled 2016-12-24: qty 1

## 2016-12-24 MED ORDER — IOPAMIDOL (ISOVUE-300) INJECTION 61%
100.0000 mL | Freq: Once | INTRAVENOUS | Status: AC | PRN
  Administered 2016-12-24: 100 mL via INTRAVENOUS

## 2016-12-24 MED ORDER — IOPAMIDOL (ISOVUE-300) INJECTION 61%
30.0000 mL | Freq: Once | INTRAVENOUS | Status: AC | PRN
  Administered 2016-12-24: 30 mL via ORAL

## 2016-12-24 MED ORDER — OXYCODONE-ACETAMINOPHEN 5-325 MG PO TABS: 1.0000 | ORAL_TABLET | ORAL | 0 refills | Status: DC | PRN

## 2016-12-24 MED ORDER — SODIUM CHLORIDE 0.9 % IV SOLN
Freq: Once | INTRAVENOUS | Status: AC
  Administered 2016-12-24: 16:00:00 via INTRAVENOUS

## 2016-12-24 MED ORDER — ONDANSETRON 4 MG PO TBDP: 4.0000 mg | ORAL_TABLET | Freq: Three times a day (TID) | ORAL | 0 refills | Status: DC | PRN

## 2016-12-24 NOTE — ED Provider Notes (Signed)
[facility] Emergency Department Provider Note   ____________________________________________   First MD Initiated Contact with Patient 12/24/16 1514     (approximate)  I have reviewed the triage vital signs and the nursing notes.   HISTORY  Chief Complaint Abdominal Pain    HPI [name] is a 71 y.o. female Patient reports that left upper quadrant pain off and on since the end of June. Last anywhere from one day one week when it comes on there is some nausea with no vomiting or diarrhea. She has some pain in the right lower quadrant but that's been there for many years. She reports she seen GI and is put on omeprazole and iron as her hemoglobin was 9, but the pain is getting worse.she denies any blood in her stool. hink she does makes the pain any better or worse. The pain feels throbbing in nature.  Past Medical History:  Diagnosis Date  . GERD (gastroesophageal reflux disease)   . Hypothyroidism   . Major depressive disorder, recurrent episode, mild ([place])   . Sleep apnea     Patient Active Problem List   Diagnosis Date Noted  . MDD (major depressive disorder), recurrent episode, severe ([place]) 06/04/2014    Past Surgical History:  Procedure Laterality Date  . ABDOMINAL HYSTERECTOMY    . CHOLECYSTECTOMY    . NO PAST SURGERIES      Prior to Admission medications   Medication Sig Start Date End Date Taking? Authorizing Provider  estradiol (ESTRACE) 0.5 MG tablet Take 1 tablet (0.5 mg total) by mouth daily. 07/19/12  Yes Patrecia Pour, NP  lamoTRIgine (LAMICTAL) 200 MG tablet Take 1 tablet (200 mg total) by mouth daily. 12/21/16  Yes Arfeen, Arlyce Harman, MD  levothyroxine (SYNTHROID, LEVOTHROID) 88 MCG tablet Take 88 mcg by mouth daily before breakfast.  12/11/14  Yes [provider]  LORazepam (ATIVAN) 1 MG tablet Take 1 tablet (1 mg total) by mouth daily. 12/21/16  Yes Arfeen, Arlyce Harman, MD  methylphenidate (RITALIN) 5 MG tablet Take 1  tablet (5 mg total) by mouth daily. 12/21/16 12/21/17 Yes Arfeen, Arlyce Harman, MD  omeprazole (PRILOSEC) 40 MG capsule Take 40 mg by mouth 2 (two) times daily.  05/18/14  Yes [provider]  traZODone (DESYREL) 50 MG tablet Take 1 tablet (50 mg total) by mouth at bedtime. 12/21/16  Yes Arfeen, Arlyce Harman, MD  TRINTELLIX 20 MG TABS Take 20 mg by mouth daily. 12/21/16  Yes Arfeen, Arlyce Harman, MD  ondansetron (ZOFRAN ODT) 4 MG disintegrating tablet Take 1 tablet (4 mg total) by mouth every 8 (eight) hours as needed for nausea or vomiting. 12/24/16   Nena Polio, MD    Allergies Codeine  Family History  Problem Relation Age of Onset  . Depression Brother   . Alcohol abuse Brother   . Suicidality Brother   . Stroke Mother   . Neuropathy Mother   . Bipolar disorder Neg Hx     Social History Social History  Substance Use Topics  . Smoking status: Never Smoker  . Smokeless tobacco: Never Used  . Alcohol use 4.2 oz/week    7 Shots of liquor per week     Comment: 1 drink per night    Review of Systems  Constitutional: No fever/chills Eyes: No visual changes. ENT: No sore throat. Cardiovascular: Denies chest pain. Respiratory: Denies shortness of breath. Gastrointestinal: see history of present illness Genitourinary: Negative for dysuria. Musculoskeletal: Negative for back pain. Skin:  Negative for rash. Neurological: Negative for headaches, focal weakness   ____________________________________________   PHYSICAL EXAM:  VITAL SIGNS: ED Triage Vitals  Enc Vitals Group     BP 12/24/16 1420 136/71     Pulse Rate 12/24/16 1420 76     Resp 12/24/16 1420 18     Temp 12/24/16 1420 98.3 F (36.8 C)     Temp Source 12/24/16 1420 Oral     SpO2 12/24/16 1420 100 %     Weight 12/24/16 1421 180 lb (81.6 kg)     Height 12/24/16 1421 5\' 2"  (1.575 m)     Head Circumference --      Peak Flow --      Pain Score 12/24/16 1419 4     Pain Loc --      Pain Edu? --      Excl. in [place]? --      Constitutional: Alert and oriented. Well appearing and in no acute distress. Eyes: Conjunctivae are normal. PERRL. EOMI. Head: Atraumatic. Nose: No congestion/rhinnorhea. Mouth/Throat: Mucous membranes are moist.  Oropharynx non-erythematous. Neck: No stridor. Cardiovascular: Normal rate, regular rhythm. Grossly normal heart sounds.  Good peripheral circulation. Respiratory: Normal respiratory effort.  No retractions. Lungs CTAB. Gastrointestinal: Soft and nontenderon exam except for one small area in the right lower quadrant with patient's little bit tender to palpation.. No distention. No abdominal bruits. No CVA tenderness. Musculoskeletal: No lower extremity tenderness nor edema.  No joint effusions. Neurologic:  Normal speech and language. No gross focal neurologic deficits are appreciated. No gait instability. Skin:  Skin is warm, dry and intact. No rash noted. Psychiatric: Mood and affect are normal. Speech and behavior are normal.  ____________________________________________   LABS (all labs ordered are listed, but only abnormal results are displayed)  Labs Reviewed  COMPREHENSIVE METABOLIC PANEL - Abnormal; Notable for the following:       Result Value   Glucose, Bld 104 (*)    Total Bilirubin 0.2 (*)    All other components within normal limits  CBC - Abnormal; Notable for the following:    Hemoglobin 10.4 (*)    HCT 31.3 (*)    RDW 19.6 (*)    All other components within normal limits  URINALYSIS, COMPLETE (UACMP) WITH MICROSCOPIC - Abnormal; Notable for the following:    Color, Urine YELLOW (*)    APPearance CLEAR (*)    Squamous Epithelial / LPF 0-5 (*)    All other components within normal limits  LIPASE, BLOOD   ____________________________________________  EKG   ____________________________________________  RADIOLOGY  IMPRESSION: Suggestion of masslike thickening involving the the cecum. While this may potentially be secondary to  stool/intraluminal debris, cecal mass cannot be excluded. In the nonacute setting, recommend further evaluation with colonoscopy.  Multiple nonspecific nodules within the gastric lumen. Recommend attention on upcoming upper endoscopy.  No acute process within the abdomen.  Sigmoid colonic diverticulosis without CT evidence for acute diverticulitis.   Electronically Signed   By: Lovey Newcomer M.D.   On: 12/24/2016 17:19 ____________________________________________   PROCEDURES  Procedure(s) performed:   Procedures  Critical Care performed:  ____________________________________________   INITIAL IMPRESSION / ASSESSMENT AND PLAN / ED COURSE  Pertinent labs & imaging results that were available during my care of the patient were reviewed by me and considered in my medical decision making (see chart for details).        ____________________________________________   FINAL CLINICAL IMPRESSION(S) / ED DIAGNOSES  Final diagnoses:  Left upper  quadrant pain      NEW MEDICATIONS STARTED DURING THIS VISIT:  New Prescriptions   ONDANSETRON (ZOFRAN ODT) 4 MG DISINTEGRATING TABLET    Take 1 tablet (4 mg total) by mouth every 8 (eight) hours as needed for nausea or vomiting.     Note:  This document was prepared using Dragon voice recognition software and may include unintentional dictation errors.    Nena Polio, MD 12/24/16 1739

## 2016-12-24 NOTE — ED Notes (Signed)
Patient verbalizes understanding of d/c instructions and follow-up. VS stable and pain controlled per patient.  Patient in NAD at time of d/c and denies further concerns regarding this visit. Patient stable at the time of departure from the unit, departing unit by the safest and most appropriate manner per that patients condition and limitations. Patient advised to return to the ED at any time for emergent concerns, or for new/worsening symptoms.   Pt refused wheel chair out

## 2016-12-24 NOTE — ED Triage Notes (Signed)
Pt reports LLQ abdominal pain and nausea since 10/25/16. Pt reports has seen PCP and GI multiple times in July. Has been treated with iron for hemoglobin of 9. Pt denies rectal bleed. Pt reports is taking omeprazole and zofran. Pt is scheduled for endoscopy 03/2017 but the pain is increasing. Pt ambulatory to triage, no apparent distress noted.

## 2016-12-24 NOTE — Discharge Instructions (Signed)
Dr. Vira Agar answered for Dr. Gustavo Lah. He wants to see you in the office at 2:00 tomorrow. Please make sure he go there. Use the Zofranorally disintegrating tablets 13 times a day if needed for nausea.

## 2016-12-25 DIAGNOSIS — R1031 Right lower quadrant pain: Secondary | ICD-10-CM | POA: Diagnosis not present

## 2016-12-25 DIAGNOSIS — R1012 Left upper quadrant pain: Secondary | ICD-10-CM | POA: Diagnosis not present

## 2016-12-25 DIAGNOSIS — R110 Nausea: Secondary | ICD-10-CM | POA: Diagnosis not present

## 2016-12-25 DIAGNOSIS — R935 Abnormal findings on diagnostic imaging of other abdominal regions, including retroperitoneum: Secondary | ICD-10-CM | POA: Diagnosis not present

## 2016-12-28 ENCOUNTER — Encounter
Admission: RE | Disposition: A | Discharge status: Home / Self Care | Payer: Self-pay | Source: Ambulatory Visit | Attending: Unknown Physician Specialty

## 2016-12-28 ENCOUNTER — Ambulatory Visit: Payer: Medicare Other | Admitting: Anesthesiology

## 2016-12-28 ENCOUNTER — Encounter: Payer: Self-pay | Admitting: *Deleted

## 2016-12-28 ENCOUNTER — Ambulatory Visit
Admission: RE | Admit: 2016-12-28 | Discharge: 2016-12-28 | Disposition: A | Discharge status: Home / Self Care | Payer: Medicare Other | Source: Ambulatory Visit | Attending: Unknown Physician Specialty | Admitting: Unknown Physician Specialty

## 2016-12-28 DIAGNOSIS — Z9049 Acquired absence of other specified parts of digestive tract: Secondary | ICD-10-CM | POA: Diagnosis not present

## 2016-12-28 DIAGNOSIS — Z820 Family history of epilepsy and other diseases of the nervous system: Secondary | ICD-10-CM | POA: Diagnosis not present

## 2016-12-28 DIAGNOSIS — D127 Benign neoplasm of rectosigmoid junction: Secondary | ICD-10-CM | POA: Diagnosis not present

## 2016-12-28 DIAGNOSIS — C180 Malignant neoplasm of cecum: Secondary | ICD-10-CM | POA: Insufficient documentation

## 2016-12-28 DIAGNOSIS — K317 Polyp of stomach and duodenum: Secondary | ICD-10-CM | POA: Insufficient documentation

## 2016-12-28 DIAGNOSIS — K635 Polyp of colon: Secondary | ICD-10-CM | POA: Diagnosis not present

## 2016-12-28 DIAGNOSIS — Z818 Family history of other mental and behavioral disorders: Secondary | ICD-10-CM | POA: Insufficient documentation

## 2016-12-28 DIAGNOSIS — K5730 Diverticulosis of large intestine without perforation or abscess without bleeding: Secondary | ICD-10-CM | POA: Insufficient documentation

## 2016-12-28 DIAGNOSIS — K219 Gastro-esophageal reflux disease without esophagitis: Secondary | ICD-10-CM | POA: Diagnosis not present

## 2016-12-28 DIAGNOSIS — Z823 Family history of stroke: Secondary | ICD-10-CM | POA: Insufficient documentation

## 2016-12-28 DIAGNOSIS — Z885 Allergy status to narcotic agent status: Secondary | ICD-10-CM | POA: Diagnosis not present

## 2016-12-28 DIAGNOSIS — K633 Ulcer of intestine: Secondary | ICD-10-CM | POA: Insufficient documentation

## 2016-12-28 DIAGNOSIS — D123 Benign neoplasm of transverse colon: Secondary | ICD-10-CM | POA: Diagnosis not present

## 2016-12-28 DIAGNOSIS — R1012 Left upper quadrant pain: Secondary | ICD-10-CM | POA: Insufficient documentation

## 2016-12-28 DIAGNOSIS — Z811 Family history of alcohol abuse and dependence: Secondary | ICD-10-CM | POA: Diagnosis not present

## 2016-12-28 DIAGNOSIS — Z79899 Other long term (current) drug therapy: Secondary | ICD-10-CM | POA: Insufficient documentation

## 2016-12-28 DIAGNOSIS — K449 Diaphragmatic hernia without obstruction or gangrene: Secondary | ICD-10-CM | POA: Insufficient documentation

## 2016-12-28 DIAGNOSIS — Z90710 Acquired absence of both cervix and uterus: Secondary | ICD-10-CM | POA: Insufficient documentation

## 2016-12-28 DIAGNOSIS — D122 Benign neoplasm of ascending colon: Secondary | ICD-10-CM | POA: Diagnosis not present

## 2016-12-28 DIAGNOSIS — F329 Major depressive disorder, single episode, unspecified: Secondary | ICD-10-CM | POA: Insufficient documentation

## 2016-12-28 DIAGNOSIS — R1031 Right lower quadrant pain: Secondary | ICD-10-CM | POA: Diagnosis not present

## 2016-12-28 DIAGNOSIS — G4730 Sleep apnea, unspecified: Secondary | ICD-10-CM | POA: Insufficient documentation

## 2016-12-28 DIAGNOSIS — E039 Hypothyroidism, unspecified: Secondary | ICD-10-CM | POA: Insufficient documentation

## 2016-12-28 HISTORY — PX: COLONOSCOPY WITH PROPOFOL: SHX5780

## 2016-12-28 HISTORY — DX: Other specified postprocedural states: Z98.890

## 2016-12-28 HISTORY — PX: ESOPHAGOGASTRODUODENOSCOPY (EGD) WITH PROPOFOL: SHX5813

## 2016-12-28 HISTORY — DX: Nausea with vomiting, unspecified: R11.2

## 2016-12-28 SURGERY — ESOPHAGOGASTRODUODENOSCOPY (EGD) WITH PROPOFOL
Anesthesia: General

## 2016-12-28 MED ORDER — MIDAZOLAM HCL 2 MG/2ML IJ SOLN
INTRAMUSCULAR | Status: DC | PRN
  Administered 2016-12-28: 2 mg via INTRAVENOUS

## 2016-12-28 MED ORDER — MIDAZOLAM HCL 2 MG/2ML IJ SOLN
INTRAMUSCULAR | Status: AC
  Filled 2016-12-28: qty 2

## 2016-12-28 MED ORDER — ONDANSETRON HCL 4 MG/2ML IJ SOLN: 4.0000 mg | Freq: Once | INTRAMUSCULAR | Status: DC | PRN

## 2016-12-28 MED ORDER — ONDANSETRON HCL 4 MG/2ML IJ SOLN
INTRAMUSCULAR | Status: AC
Start: 2016-12-28 — End: 2016-12-28
  Filled 2016-12-28: qty 2

## 2016-12-28 MED ORDER — BUTAMBEN-TETRACAINE-BENZOCAINE 2-2-14 % EX AERO
INHALATION_SPRAY | CUTANEOUS | Status: AC
  Filled 2016-12-28: qty 5

## 2016-12-28 MED ORDER — ONDANSETRON HCL 4 MG/2ML IJ SOLN
INTRAMUSCULAR | Status: DC | PRN
  Administered 2016-12-28: 4 mg via INTRAVENOUS

## 2016-12-28 MED ORDER — GLYCOPYRROLATE 0.2 MG/ML IJ SOLN
INTRAMUSCULAR | Status: DC | PRN
  Administered 2016-12-28: 0.1 mg via INTRAVENOUS

## 2016-12-28 MED ORDER — GLYCOPYRROLATE 0.2 MG/ML IJ SOLN
INTRAMUSCULAR | Status: AC
  Filled 2016-12-28: qty 1

## 2016-12-28 MED ORDER — FENTANYL CITRATE (PF) 100 MCG/2ML IJ SOLN: 25.0000 ug | INTRAMUSCULAR | Status: DC | PRN

## 2016-12-28 MED ORDER — SODIUM CHLORIDE 0.9 % IV SOLN
INTRAVENOUS | Status: DC
  Administered 2016-12-28: 1000 mL via INTRAVENOUS

## 2016-12-28 MED ORDER — FENTANYL CITRATE (PF) 100 MCG/2ML IJ SOLN
INTRAMUSCULAR | Status: AC
  Filled 2016-12-28: qty 2

## 2016-12-28 MED ORDER — LIDOCAINE HCL 2 % EX GEL
CUTANEOUS | Status: AC
  Filled 2016-12-28: qty 5

## 2016-12-28 MED ORDER — PROPOFOL 500 MG/50ML IV EMUL
INTRAVENOUS | Status: DC | PRN
  Administered 2016-12-28: 120 ug/kg/min via INTRAVENOUS

## 2016-12-28 MED ORDER — SODIUM CHLORIDE 0.9 % IV SOLN: INTRAVENOUS | Status: DC

## 2016-12-28 MED ORDER — PROPOFOL 500 MG/50ML IV EMUL
INTRAVENOUS | Status: AC
Start: 2016-12-28 — End: 2016-12-28
  Filled 2016-12-28: qty 50

## 2016-12-28 MED ORDER — EPHEDRINE SULFATE 50 MG/ML IJ SOLN
INTRAMUSCULAR | Status: DC | PRN
  Administered 2016-12-28: 50 mg via INTRAVENOUS

## 2016-12-28 MED ORDER — LIDOCAINE HCL (CARDIAC) 20 MG/ML IV SOLN
INTRAVENOUS | Status: DC | PRN
  Administered 2016-12-28: 30 mg via INTRAVENOUS

## 2016-12-28 MED ORDER — FENTANYL CITRATE (PF) 100 MCG/2ML IJ SOLN
INTRAMUSCULAR | Status: DC | PRN
  Administered 2016-12-28: 50 ug via INTRAVENOUS

## 2016-12-28 MED ORDER — EPHEDRINE SULFATE 50 MG/ML IJ SOLN
INTRAMUSCULAR | Status: AC
  Filled 2016-12-28: qty 1

## 2016-12-28 MED ORDER — PROPOFOL 10 MG/ML IV BOLUS
INTRAVENOUS | Status: AC
  Filled 2016-12-28: qty 20

## 2016-12-28 NOTE — H&P (Signed)
Primary Care Physician:  Lavera Guise, MD Primary Gastroenterologist:  Dr. Vira Agar  Pre-Procedure History & Physical: HPI:  [name] is a 71 y.o. female is here for an endoscopy and colonoscopy.   Past Medical History:  Diagnosis Date  . GERD (gastroesophageal reflux disease)   . Hypothyroidism   . Major depressive disorder, recurrent episode, mild ([place])   . PONV (postoperative nausea and vomiting)   . Sleep apnea     Past Surgical History:  Procedure Laterality Date  . ABDOMINAL HYSTERECTOMY    . CHOLECYSTECTOMY    . NO PAST SURGERIES      Prior to Admission medications   Medication Sig Start Date End Date Taking? Authorizing Provider  estradiol (ESTRACE) 0.5 MG tablet Take 1 tablet (0.5 mg total) by mouth daily. 07/19/12  Yes Patrecia Pour, NP  lamoTRIgine (LAMICTAL) 200 MG tablet Take 1 tablet (200 mg total) by mouth daily. 12/21/16  Yes Arfeen, Arlyce Harman, MD  levothyroxine (SYNTHROID, LEVOTHROID) 88 MCG tablet Take 88 mcg by mouth daily before breakfast.  12/11/14  Yes [provider]  LORazepam (ATIVAN) 1 MG tablet Take 1 tablet (1 mg total) by mouth daily. 12/21/16  Yes Arfeen, Arlyce Harman, MD  methylphenidate (RITALIN) 5 MG tablet Take 1 tablet (5 mg total) by mouth daily. 12/21/16 12/21/17 Yes Arfeen, Arlyce Harman, MD  Multiple Vitamin (MULTIVITAMIN) tablet Take 1 tablet by mouth daily.   Yes [provider]  omeprazole (PRILOSEC) 40 MG capsule Take 40 mg by mouth 2 (two) times daily.  05/18/14  Yes [provider]  ondansetron (ZOFRAN ODT) 4 MG disintegrating tablet Take 1 tablet (4 mg total) by mouth every 8 (eight) hours as needed for nausea or vomiting. 12/24/16  Yes Nena Polio, MD  oxyCODONE-acetaminophen (ROXICET) 5-325 MG tablet Take 1 tablet by mouth every 4 (four) hours as needed for severe pain. 12/24/16  Yes Nena Polio, MD  traZODone (DESYREL) 50 MG tablet Take 1 tablet (50 mg total) by mouth at bedtime. 12/21/16  Yes Arfeen, Arlyce Harman, MD   TRINTELLIX 20 MG TABS Take 20 mg by mouth daily. 12/21/16  Yes Kathlee Nations, MD    Allergies as of 11/12/2016 - Review Complete 10/22/2016  Allergen Reaction Noted  . Codeine  07/15/2012    Family History  Problem Relation Age of Onset  . Depression Brother   . Alcohol abuse Brother   . Suicidality Brother   . Stroke Mother   . Neuropathy Mother   . Bipolar disorder Neg Hx     Social History   Social History  . Marital status: Married    Spouse name: N/A  . Number of children: N/A  . Years of education: N/A   Occupational History  . Not on file.   Social History Main Topics  . Smoking status: Never Smoker  . Smokeless tobacco: Never Used  . Alcohol use 4.2 oz/week    7 Shots of liquor per week     Comment: 1 drink per night  . Drug use: No  . Sexual activity: Yes    Partners: Male    Birth control/ protection: None   Other Topics Concern  . Not on file   Social History Narrative  . No narrative on file    Review of Systems: See HPI, otherwise negative ROS  Physical Exam: BP (!) 125/59   Pulse 75   Temp 98.6 F (37 C) (Tympanic)   Resp 16   Ht  5\' 2"  (1.575 m)   Wt 80.7 kg (178 lb)   LMP  (LMP Unknown)   BMI 32.56 kg/m  General:   Alert,  pleasant and cooperative in NAD Head:  Normocephalic and atraumatic. Neck:  Supple; no masses or thyromegaly. Lungs:  Clear throughout to auscultation.    Heart:  Regular rate and rhythm. Abdomen:  Soft, nontender and nondistended. Normal bowel sounds, without guarding, and without rebound.   Neurologic:  Alert and  oriented x4;  grossly normal neurologically.  Impression/Plan: [name] is here for an endoscopy and colonoscopy to be performed for Left upper abdominal pain, GERD, abnormal CT scan of cecum suggesting a mass.  Risks, benefits, limitations, and alternatives regarding  endoscopy and colonoscopy have been reviewed with the patient.  Questions have been answered.  All parties  agreeable.   Gaylyn Cheers, MD  12/28/2016, 3:14 PM

## 2016-12-28 NOTE — Anesthesia Procedure Notes (Signed)
Performed by: COOK-MARTIN, Joie Hipps Pre-anesthesia Checklist: Patient identified, Emergency Drugs available, Suction available, Patient being monitored and Timeout performed Patient Re-evaluated:Patient Re-evaluated prior to induction Oxygen Delivery Method: Nasal cannula Preoxygenation: Pre-oxygenation with 100% oxygen Induction Type: IV induction Airway Equipment and Method: Bite block Placement Confirmation: CO2 detector and positive ETCO2       

## 2016-12-28 NOTE — Anesthesia Postprocedure Evaluation (Signed)
Anesthesia Post Note  Patient: [name]  Procedure(s) Performed: Procedure(s) (LRB): ESOPHAGOGASTRODUODENOSCOPY (EGD) WITH PROPOFOL (N/A) COLONOSCOPY WITH PROPOFOL (N/A)  Patient location during evaluation: Endoscopy Anesthesia Type: General Level of consciousness: awake and alert Pain management: pain level controlled Vital Signs Assessment: post-procedure vital signs reviewed and stable Respiratory status: spontaneous breathing, nonlabored ventilation, respiratory function stable and patient connected to nasal cannula oxygen Cardiovascular status: blood pressure returned to baseline and stable Postop Assessment: no signs of nausea or vomiting Anesthetic complications: no     Last Vitals:  Vitals:   12/28/16 1640 12/28/16 1650  BP: 127/62 125/67  Pulse: 72 66  Resp: 16 16  Temp:    SpO2: 99% 99%    Last Pain:  Vitals:   12/28/16 1620  TempSrc: Tympanic                 Talayah Picardi S

## 2016-12-28 NOTE — Anesthesia Preprocedure Evaluation (Addendum)
Anesthesia Evaluation  Patient identified by MRN, date of birth, ID band Patient awake    Reviewed: Allergy & Precautions, NPO status , Patient's Chart, lab work & pertinent test results  History of Anesthesia Complications (+) PONV and history of anesthetic complications  Airway Mallampati: II  TM Distance: <3 FB     Dental   Pulmonary sleep apnea ,    Pulmonary exam normal        Cardiovascular negative cardio ROS Normal cardiovascular exam     Neuro/Psych PSYCHIATRIC DISORDERS Depression    GI/Hepatic Neg liver ROS, GERD  ,  Endo/Other  Hypothyroidism   Renal/GU negative Renal ROS  negative genitourinary   Musculoskeletal negative musculoskeletal ROS (+)   Abdominal Normal abdominal exam  (+)   Peds negative pediatric ROS (+)  Hematology negative hematology ROS (+)   Anesthesia Other Findings   Reproductive/Obstetrics                            Anesthesia Physical Anesthesia Plan  ASA: III  Anesthesia Plan: General   Post-op Pain Management:    Induction: Intravenous  PONV Risk Score and Plan:   Airway Management Planned: Nasal Cannula  Additional Equipment:   Intra-op Plan:   Post-operative Plan:   Informed Consent: I have reviewed the patients History and Physical, chart, labs and discussed the procedure including the risks, benefits and alternatives for the proposed anesthesia with the patient or authorized representative who has indicated his/her understanding and acceptance.   Dental advisory given  Plan Discussed with: CRNA and Surgeon  Anesthesia Plan Comments:         Anesthesia Quick Evaluation

## 2016-12-28 NOTE — Transfer of Care (Signed)
Immediate Anesthesia Transfer of Care Note  Patient: [name]  Procedure(s) Performed: Procedure(s): ESOPHAGOGASTRODUODENOSCOPY (EGD) WITH PROPOFOL (N/A) COLONOSCOPY WITH PROPOFOL (N/A)  Patient Location: PACU  Anesthesia Type:General  Level of Consciousness: awake and sedated  Airway & Oxygen Therapy: Patient Spontanous Breathing and Patient connected to nasal cannula oxygen  Post-op Assessment: Report given to RN and Post -op Vital signs reviewed and stable  Post vital signs: Reviewed and stable  Last Vitals:  Vitals:   12/28/16 1454  BP: (!) 125/59  Pulse: 75  Resp: 16  Temp: 37 C    Last Pain:  Vitals:   12/28/16 1454  TempSrc: Tympanic         Complications: No apparent anesthesia complications

## 2016-12-28 NOTE — Op Note (Signed)
[facility] Gastroenterology Patient Name: [name] Procedure Date: 12/28/2016 3:24 PM MRN: [id number] Account #: [id number] Date of Birth: 1945/07/02 Admit Type: Outpatient Age: 71 Room: [facility] ENDO ROOM 1 Gender: Female Note Status: Finalized Procedure:            Upper GI endoscopy Indications:          Abdominal pain in the left upper quadrant Providers:            Manya Silvas, MD Referring MD:         Lavera Guise, MD (Referring MD) Medicines:            Propofol per Anesthesia Complications:        No immediate complications. Procedure:            Pre-Anesthesia Assessment:                       - After reviewing the risks and benefits, the patient                        was deemed in satisfactory condition to undergo the                        procedure.                       After obtaining informed consent, the endoscope was                        passed under direct vision. Throughout the procedure,                        the patient's blood pressure, pulse, and oxygen                        saturations were monitored continuously. The Endoscope                        was introduced through the mouth, and advanced to the                        second part of duodenum. The upper GI endoscopy was                        accomplished without difficulty. The patient tolerated                        the procedure well. Findings:      The examined esophagus was normal. GEJ 38cm.      A small hiatal hernia was present.      Multiple large pedunculated and sessile polyps with no bleeding and no       stigmata of recent bleeding were found in the gastric body, on the       anterior wall of the stomach, on the greater curvature of the stomach,       on the lesser curvature of the stomach and on the posterior wall of the       stomach. Biopsies were taken from 3 polyps with a cold forceps for       histology.      The examined duodenum was  normal. Impression:           -  Normal esophagus.                       - Small hiatal hernia.                       - Multiple gastric polyps. Biopsied.                       - Normal examined duodenum. Recommendation:       - The findings and recommendations were discussed with                        the patient's family. Manya Silvas, MD 12/28/2016 3:37:24 PM This report has been signed electronically. Number of Addenda: 0 Note Initiated On: 12/28/2016 3:24 PM      [facility]

## 2016-12-28 NOTE — Op Note (Signed)
[facility] Gastroenterology Patient Name: [name] Procedure Date: 12/28/2016 3:24 PM MRN: [id number] Account #: [id number] Date of Birth: 1945-12-30 Admit Type: Outpatient Age: 71 Room: [facility] ENDO ROOM 1 Gender: Female Note Status: Finalized Procedure:            Colonoscopy Indications:          Abdominal pain in the right lower quadrant, Abnormal CT                        of the GI tract Providers:            Manya Silvas, MD Referring MD:         Lavera Guise, MD (Referring MD) Medicines:            Propofol per Anesthesia Complications:        No immediate complications. Procedure:            Pre-Anesthesia Assessment:                       - After reviewing the risks and benefits, the patient                        was deemed in satisfactory condition to undergo the                        procedure.                       After obtaining informed consent, the colonoscope was                        passed under direct vision. Throughout the procedure,                        the patient's blood pressure, pulse, and oxygen                        saturations were monitored continuously. The                        Colonoscope was introduced through the anus and                        advanced to the the cecum, identified by appendiceal                        orifice and ileocecal valve. The colonoscopy was                        somewhat difficult due to restricted mobility of the                        colon. Successful completion of the procedure was aided                        by applying abdominal pressure. The patient tolerated                        the procedure well. The quality of the bowel  preparation was excellent. Findings:      I first tried the regular scope but was unable to pass beyond the mid       sigmoid so I changed to a pediatric scope and was able to complete the       exam to the cecum where the mass  was seen on CT scan.      A medium polyp was found in the transverse colon. The polyp was sessile.       The polyp was removed with a hot snare. Resection and retrieval were       complete. To prevent bleeding after the polypectomy, one hemostatic clip       was successfully placed. There was no bleeding during, or at the end, of       the procedure.      A small polyp was found in the recto-sigmoid colon. The polyp was       sessile. The polyp was removed with a hot snare. Resection and retrieval       were complete.      A diminutive polyp was found in the recto-sigmoid colon. The polyp was       sessile. The polyp was removed with a jumbo cold forceps. Resection and       retrieval were complete.      Multiple small-mouthed diverticula were found in the sigmoid colon.      An infiltrative and ulcerated non-obstructing large mass was found at       the ileocecal valve. The mass measured five cm in length. In addition,       its diameter measured four-five cm. No bleeding was present. Biopsies       were taken with a cold forceps for histology. The tissue was very hard. Impression:           - One medium polyp in the transverse colon, removed                        with a hot snare. Resected and retrieved. Clip was                        placed.                       - One small polyp at the recto-sigmoid colon, removed                        with a hot snare. Resected and retrieved.                       - One diminutive polyp at the recto-sigmoid colon,                        removed with a jumbo cold forceps. Resected and                        retrieved.                       - Diverticulosis in the sigmoid colon.                       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  Biopsied. Recommendation:       Surgical consult very soon. Needs removal.                       - Await pathology results. Procedure Code(s):    --- Professional ---                        [phone], Colonoscopy, flexible; with removal of tumor(s),                        polyp(s), or other lesion(s) by snare technique                       45380, 80, Colonoscopy, flexible; with biopsy, single                        or multiple Diagnosis Code(s):    --- Professional ---                       D12.3, Benign neoplasm of transverse colon (hepatic                        flexure or splenic flexure)                       D12.7, Benign neoplasm of rectosigmoid junction                       D49.0, Neoplasm of unspecified behavior of digestive                        system                       R10.31, Right lower quadrant pain                       K57.30, Diverticulosis of large intestine without                        perforation or abscess without bleeding                       R93.3, Abnormal findings on diagnostic imaging of other                        parts of digestive tract CPT copyright 2016 American Medical Association. All rights reserved. The codes documented in this report are preliminary and upon coder review may  be revised to meet current compliance requirements. Manya Silvas, MD 12/28/2016 4:25:07 PM This report has been signed electronically. Number of Addenda: 0 Note Initiated On: 12/28/2016 3:24 PM Scope Withdrawal Time: 0 hours 21 minutes 33 seconds  Total Procedure Duration: 0 hours 36 minutes 57 seconds       [facility]

## 2016-12-28 NOTE — Anesthesia Post-op Follow-up Note (Signed)
Anesthesia QCDR form completed.        

## 2016-12-29 ENCOUNTER — Encounter: Payer: Self-pay | Admitting: Unknown Physician Specialty

## 2016-12-30 ENCOUNTER — Other Ambulatory Visit: Payer: Self-pay | Admitting: Pathology

## 2016-12-30 LAB — SURGICAL PATHOLOGY

## 2016-12-31 ENCOUNTER — Encounter: Payer: Self-pay | Admitting: General Surgery

## 2016-12-31 ENCOUNTER — Ambulatory Visit (INDEPENDENT_AMBULATORY_CARE_PROVIDER_SITE_OTHER): Payer: Medicare Other | Admitting: General Surgery

## 2016-12-31 VITALS — BP 132/74 | Resp 14 | Ht 62.0 in

## 2016-12-31 DIAGNOSIS — C182 Malignant neoplasm of ascending colon: Secondary | ICD-10-CM | POA: Diagnosis not present

## 2016-12-31 MED ORDER — POLYETHYLENE GLYCOL 3350 17 GM/SCOOP PO POWD: ORAL | 0 refills | Status: DC

## 2016-12-31 MED ORDER — NEOMYCIN SULFATE 500 MG PO TABS: ORAL_TABLET | ORAL | 0 refills | Status: DC

## 2016-12-31 MED ORDER — SCOPOLAMINE 1 MG/3DAYS TD PT72: 1.0000 | MEDICATED_PATCH | TRANSDERMAL | 12 refills | Status: DC

## 2016-12-31 MED ORDER — METRONIDAZOLE 500 MG PO TABS
ORAL_TABLET | ORAL | 0 refills | Status: DC
Start: 2016-12-31 — End: 2017-01-24

## 2016-12-31 NOTE — Progress Notes (Signed)
Patient ID: [name], female   DOB: 1946/03/30, 71 y.o.   MRN: [id number]  Chief Complaint  Patient presents with  . Mass    HPI [name] is a 71 y.o. female here today following up from a colonoscopy done on 12/25/2016 by Dr. Vira Agar. Moves her bowels daily. Patient states she is nausea, states it started at the end of June .She states she was having pain in her left upper quadrant pain was seen in the ER on 12/24/2016.  Husband, [name] is present at visit.   Emily KitchenHPI  Past Medical History:  Diagnosis Date  . GERD (gastroesophageal reflux disease)   . Hypothyroidism   . Major depressive disorder, recurrent episode, mild ([place])   . PONV (postoperative nausea and vomiting)   . Sleep apnea     Past Surgical History:  Procedure Laterality Date  . ABDOMINAL HYSTERECTOMY  1980  . CHOLECYSTECTOMY    . COLONOSCOPY WITH PROPOFOL N/A 12/28/2016   Procedure: COLONOSCOPY WITH PROPOFOL;  Surgeon: Manya Silvas, MD;  Location[facility] ENDOSCOPY;  Service: Endoscopy;  Laterality: N/A;  . ESOPHAGOGASTRODUODENOSCOPY (EGD) WITH PROPOFOL N/A 12/28/2016   Procedure: ESOPHAGOGASTRODUODENOSCOPY (EGD) WITH PROPOFOL;  Surgeon: Manya Silvas, MD;  Location: [facility] ENDOSCOPY;  Service: Endoscopy;  Laterality: N/A;    Family History  Problem Relation Age of Onset  . Depression Brother   . Alcohol abuse Brother   . Suicidality Brother   . Stroke Mother   . Neuropathy Mother   . Colon polyps Father   . Bipolar disorder Neg Hx     Social History Social History  Substance Use Topics  . Smoking status: Never Smoker  . Smokeless tobacco: Never Used  . Alcohol use 4.2 oz/week    7 Shots of liquor per week     Comment: 1 drink per night    Allergies  Allergen Reactions  . Codeine     Nausea    Current Outpatient Prescriptions  Medication Sig Dispense Refill  . cetirizine (ZYRTEC) 10 MG tablet Take 10 mg by mouth daily.    [name] estradiol (ESTRACE) 0.5 MG tablet Take 1  tablet (0.5 mg total) by mouth daily. 30 tablet 0  . lamoTRIgine (LAMICTAL) 200 MG tablet Take 1 tablet (200 mg total) by mouth daily. 30 tablet 2  . levothyroxine (SYNTHROID, LEVOTHROID) 88 MCG tablet Take 88 mcg by mouth daily before breakfast.   2  . LORazepam (ATIVAN) 1 MG tablet Take by mouth.    . methylphenidate (RITALIN) 5 MG tablet Take 1 tablet (5 mg total) by mouth daily. 30 tablet 0  . Multiple Vitamin (MULTIVITAMIN) tablet Take 1 tablet by mouth daily.    . Multiple Vitamins-Minerals (MULTIVITAMIN WITH MINERALS) tablet Take 1 tablet by mouth daily.    [name] omeprazole (PRILOSEC) 40 MG capsule Take 40 mg by mouth 2 (two) times daily.   1  . ondansetron (ZOFRAN ODT) 4 MG disintegrating tablet Take 1 tablet (4 mg total) by mouth every 8 (eight) hours as needed for nausea or vomiting. 20 tablet 0  . traZODone (DESYREL) 50 MG tablet Take 1 tablet (50 mg total) by mouth at bedtime. 30 tablet 2  . TRINTELLIX 20 MG TABS Take 20 mg by mouth daily. 30 tablet 2  . metroNIDAZOLE (FLAGYL) 500 MG tablet Take one (1) tablet at 6 pm and one (1) tablet at 11 pm the evening prior to surgery. 2 tablet 0  . neomycin (MYCIFRADIN) 500 MG tablet Take two (2) tablets  at 6 pm and two (2) tablets at 11 pm the evening prior to surgery. 4 tablet 0  . polyethylene glycol powder (GLYCOLAX/MIRALAX) powder 255 grams one bottle for bowel prep 255 g 0  . scopolamine (TRANSDERM-SCOP, 1.5 MG,) 1 MG/3DAYS Place 1 patch (1.5 mg total) onto the skin every 3 (three) days. Place one patch behind one ear the night before surgery. 10 patch 12   No current facility-administered medications for this visit.     Review of Systems Review of Systems  Constitutional: Negative.   Respiratory: Negative.   Cardiovascular: Negative.   Gastrointestinal: Positive for abdominal pain and nausea.    Blood pressure 132/74, resp. rate 14, height 5\' 2"  (1.575 m).  Physical Exam Physical Exam  Constitutional: She is oriented to person,  place, and time. She appears well-developed and well-nourished.  Eyes: Conjunctivae are normal. No scleral icterus.  Neck: Neck supple.  Cardiovascular: Normal rate, regular rhythm and normal heart sounds.   Pulmonary/Chest: Effort normal and breath sounds normal.  Abdominal: Soft. Bowel sounds are normal. There is no tenderness.    Lymphadenopathy:    She has no cervical adenopathy.  Neurological: She is alert and oriented to person, place, and time.  Skin: Skin is warm and dry.    Data Reviewed CT of the abdomen and pelvis dated 12/24/2016 was reviewed. Mass effect in the cecum/ascending colon. No discernible adenopathy. Nodularity within the stomach. These films were reviewed. No evidence of small bowel dilatation to account for the patient's reported nausea for the last 4-6 weeks. No evidence of lymph node or liver metastasis disease.  Upper and lower endoscopies dated 12/28/2016 reviewed and personal communication with the endoscopist. Cecal mass, biopsy-proven adenocarcinoma.  Multiple fundic gland polyps.  DIAGNOSIS:  A. STOMACH POLYPS, RANDOM; COLD BIOPSY:  - FUNDIC GLAND POLYPS.  - NEGATIVE FOR H. PYLORI, DYSPLASIA, AND MALIGNANCY.   B. ULCERATED COLON MASS, ILEOCECAL VALVE; COLD BIOPSY:  - INVASIVE ADENOCARCINOMA, MODERATELY DIFFERENTIATED.   C. COLON MASS, ASCENDING; COLD BIOPSY:  - SERRATED POLYP WITH FEATURES OF A TRADITIONAL SERRATED ADENOMA.  - NEGATIVE FOR HIGH-GRADE DYSPLASIA AND MALIGNANCY.   D. COLON POLYP, TRANSVERSE; HOT SNARE:  - POLYPOID COLONIC MUCOSA WITHOUT DYSPLASIA AND MALIGNANCY.   E.COLON POLYP 2, RECTOSIGMOID; HOT SNARE AND COLD BIOPSY:  - HYPERPLASTIC POLYP (1).  - NO PATHOLOGIC CHANGE (1).  - NEGATIVE FOR DYSPLASIA AND MALIGNANCY.   CBC dated 12/24/2016 showed a hemoglobin of 10.4 with a MCV of 82. White blood cell count of 9600. Elevated RDW at 19.6. Platelet count 393,000.   CBC from 2 years ago had an MCV of 89 and hemoglobin of  13.7.   Comprehensive metabolic panel and lipase of 12/24/2016 notable for normal liver function studies, normal lipase, nonfasting blood sugar elevated at 104, normal electrolytes, creatinine 0.9 with an estimated GFR greater than 60.  Assessment    Carcinoma the ascending colon.  Chronic blood loss secondary to above.  Past history major depressive episode on ongoing therapy was stable mental status.  Long-standing estrogen replacement post hysterectomy. Initiated to help with above-mentioned major depressive episode. No indication at present for discontinuation because of the likelihood of providing disequilibrium for her mental status..    Plan    Indications for right colectomy were reviewed. Plans for this to be completed as a laparoscopically-assisted procedure. No present indication for stoma.  A preoperative CEA will be obtained today.  Risks of the procedure were reviewed in detail including bleeding and infection.  Importance  of early ambulation and working with the nursing staff to achieve the same was reviewed.  The patient will continue her regular antidepressive medications including the morning of surgery.  She has a history of profound nausea and vomiting with general anesthesia and was 2 and a prescription for a Transderm-Scop patch to be placed tonight prior to the procedure.  She reports tolerating the MiraLAX prep for a well and will complete the same. She will not need to make use of Dulcolax as she did during her colonoscopy prep prep indications for oral antibiotics prior to procedure reviewed.   HPI, Physical Exam, Assessment and Plan have been scribed under the direction and in the presence of Hervey Ard, MD.  Gaspar Cola, CMA  I have completed the exam and reviewed the above documentation for accuracy and completeness.  I agree with the above.  Haematologist has been used and any errors in dictation or transcription are  unintentional.  Hervey Ard, M.D., F.A.C.S. Patient's surgery has been scheduled for 01-21-17 at [facility]. Bowel prep instructions have been reviewed with the patient today. She was instructed to call the office should she have further questions.   Dominga Ferry, CMA

## 2017-01-01 LAB — CEA: CEA: 1 ng/mL (ref 0.0–4.7)

## 2017-01-02 DIAGNOSIS — C189 Malignant neoplasm of colon, unspecified: Secondary | ICD-10-CM

## 2017-01-02 HISTORY — DX: Malignant neoplasm of colon, unspecified: C18.9

## 2017-01-05 ENCOUNTER — Telehealth: Payer: Self-pay | Admitting: General Surgery

## 2017-01-05 ENCOUNTER — Telehealth (HOSPITAL_COMMUNITY): Payer: Self-pay

## 2017-01-05 NOTE — Telephone Encounter (Signed)
Patient calling to let you know that she is scheduled for surgery to remove part of her colon - she does not need anything, but her surgeon wanted to be sure you were aware in case you wanted to make any changes in her prescriptions.

## 2017-01-05 NOTE — Telephone Encounter (Signed)
The patient was asked to contact her addition at [facility] to discuss her upcoming surgery and if any changes in her medications are required.  She raised question about discontinuing Adderall during her hospitalization as she wanted to "rest". We'll try not to change any of her routine medications including that.  She reported that she is going to decrease her PPI to once a day now that she it has been confirmed she does not have an ulcer. This is fine.

## 2017-01-11 DIAGNOSIS — L905 Scar conditions and fibrosis of skin: Secondary | ICD-10-CM | POA: Diagnosis not present

## 2017-01-11 DIAGNOSIS — C44712 Basal cell carcinoma of skin of right lower limb, including hip: Secondary | ICD-10-CM | POA: Diagnosis not present

## 2017-01-14 ENCOUNTER — Encounter
Admission: RE | Admit: 2017-01-14 | Discharge: 2017-01-14 | Disposition: A | Discharge status: Home / Self Care | Payer: Medicare Other | Source: Ambulatory Visit | Attending: General Surgery | Admitting: General Surgery

## 2017-01-14 DIAGNOSIS — R1012 Left upper quadrant pain: Secondary | ICD-10-CM | POA: Diagnosis not present

## 2017-01-14 DIAGNOSIS — Z01812 Encounter for preprocedural laboratory examination: Secondary | ICD-10-CM | POA: Diagnosis not present

## 2017-01-14 DIAGNOSIS — Z79899 Other long term (current) drug therapy: Secondary | ICD-10-CM | POA: Diagnosis not present

## 2017-01-14 DIAGNOSIS — C182 Malignant neoplasm of ascending colon: Secondary | ICD-10-CM | POA: Diagnosis not present

## 2017-01-14 DIAGNOSIS — R9431 Abnormal electrocardiogram [ECG] [EKG]: Secondary | ICD-10-CM | POA: Diagnosis not present

## 2017-01-14 DIAGNOSIS — Z01810 Encounter for preprocedural cardiovascular examination: Secondary | ICD-10-CM | POA: Diagnosis not present

## 2017-01-14 HISTORY — DX: Anemia, unspecified: D64.9

## 2017-01-14 LAB — SURGICAL PCR SCREEN
MRSA, PCR: NEGATIVE
STAPHYLOCOCCUS AUREUS: NEGATIVE

## 2017-01-14 NOTE — Patient Instructions (Signed)
Your procedure is scheduled on: 01-21-17 THURSDAY Report to Same Day Surgery 2nd floor medical mall [facility] Entrance-take elevator on left to 2nd floor.  Check in with surgery information desk.) To find out your arrival time please call [phone] between 1PM - 3PM on 01-20-17 [facility]  Remember: Instructions that are not followed completely may result in serious medical risk, up to and including death, or upon the discretion of your surgeon and anesthesiologist your surgery may need to be rescheduled.    _x___ 1. Do not eat food after midnight the night before your procedure. NO GUM CHEWING OR HARD CANDIES.  You may drink clear liquids up to 2 hours before you are scheduled to arrive at the hospital for your procedure.  Do not drink clear liquids within 2 hours of your scheduled arrival to the hospital.  Clear liquids include  --Water or Apple juice without pulp  --Clear carbohydrate beverage such as ClearFast or Gatorade  --Black Coffee or Clear Tea (No milk, no creamers, do not add anything to the coffee or Tea) Type 1 and type 2 diabetics should only drink water.  .     __x__ 2. No Alcohol for 24 hours before or after surgery.   __x__3. No Smoking for 24 prior to surgery.   ____  4. Bring all medications with you on the day of surgery if instructed.    __x__ 5. Notify your doctor if there is any change in your medical condition     (cold, fever, infections).     Do not wear jewelry, make-up, hairpins, clips or nail polish.  Do not wear lotions, powders, or perfumes. You may wear deodorant.  Do not shave 48 hours prior to surgery. Men may shave face and neck.  Do not bring valuables to the hospital.    [facility] is not responsible for any belongings or valuables.               Contacts, dentures or bridgework may not be worn into surgery.  Leave your suitcase in the car. After surgery it may be brought to your room.  For patients admitted to the hospital, discharge  time is determined by your treatment team.   Patients discharged the day of surgery will not be allowed to drive home.  You will need someone to drive you home and stay with you the night of your procedure.    Please read over the following fact sheets that you were given:   [facility] Preparing for Surgery and or MRSA Information   _x___ TAKE THE FOLLOWING MEDICATIONS THE MORNING OF SURGERY WITH A SMALL SIP OF WATER. These include:  1. LAMICTAL  2. LEVOTHYROXINE  3. ARMOUR THYROID  4. TRINTELLIX  5. PRILOSEC  6. TAKE AN EXTRA PRILOSEC Wednesday NIGHT BEFORE BED (01-20-17)  7. FOLLOW DR BYRNETT'S BOWEL PREP AS INDICATED IN OFFICE ____Fleets enema or Magnesium Citrate as directed.   _x___ Use CHG Soap or sage wipes as directed on instruction sheet   ____ Use inhalers on the day of surgery and bring to hospital day of surgery  ____ Stop Metformin and Janumet 2 days prior to surgery.    ____ Take 1/2 of usual insulin dose the night before surgery and none on the morning surgery.   ____ Follow recommendations from Cardiologist, Pulmonologist or PCP regarding stopping Aspirin, Coumadin, Plavix ,Eliquis, Effient, or Pradaxa, and Pletal.  ____Stop Anti-inflammatories such as Advil, Aleve, Ibuprofen, Motrin, Naproxen, Naprosyn, Goodies powders or aspirin products.  OK to take Tylenol    ____ Stop supplements until after surgery.     _X___ Bring C-Pap to the hospital.

## 2017-01-17 ENCOUNTER — Encounter: Payer: Self-pay | Admitting: Emergency Medicine

## 2017-01-17 ENCOUNTER — Emergency Department: Payer: Medicare Other

## 2017-01-17 ENCOUNTER — Emergency Department
Admission: EM | Admit: 2017-01-17 | Discharge: 2017-01-18 | Disposition: A | Discharge status: Home / Self Care | Payer: Medicare Other | Attending: Emergency Medicine | Admitting: Emergency Medicine

## 2017-01-17 DIAGNOSIS — R1032 Left lower quadrant pain: Secondary | ICD-10-CM | POA: Diagnosis not present

## 2017-01-17 DIAGNOSIS — K5900 Constipation, unspecified: Secondary | ICD-10-CM | POA: Insufficient documentation

## 2017-01-17 DIAGNOSIS — Z79899 Other long term (current) drug therapy: Secondary | ICD-10-CM | POA: Diagnosis not present

## 2017-01-17 DIAGNOSIS — E039 Hypothyroidism, unspecified: Secondary | ICD-10-CM | POA: Diagnosis not present

## 2017-01-17 DIAGNOSIS — C189 Malignant neoplasm of colon, unspecified: Secondary | ICD-10-CM | POA: Insufficient documentation

## 2017-01-17 DIAGNOSIS — K5730 Diverticulosis of large intestine without perforation or abscess without bleeding: Secondary | ICD-10-CM | POA: Diagnosis not present

## 2017-01-17 HISTORY — DX: Malignant (primary) neoplasm, unspecified: C80.1

## 2017-01-17 LAB — CBC
HEMATOCRIT: 32.1 % — AB (ref 35.0–47.0)
HEMOGLOBIN: 10.8 g/dL — AB (ref 12.0–16.0)
MCH: 28.2 pg (ref 26.0–34.0)
MCHC: 33.6 g/dL (ref 32.0–36.0)
MCV: 83.9 fL (ref 80.0–100.0)
Platelets: 375 10*3/uL (ref 150–440)
RBC: 3.83 MIL/uL (ref 3.80–5.20)
RDW: 18.6 % — AB (ref 11.5–14.5)
WBC: 11.6 10*3/uL — ABNORMAL HIGH (ref 3.6–11.0)

## 2017-01-17 LAB — COMPREHENSIVE METABOLIC PANEL
ALBUMIN: 3.9 g/dL (ref 3.5–5.0)
ALT: 21 U/L (ref 14–54)
ANION GAP: 8 (ref 5–15)
AST: 22 U/L (ref 15–41)
Alkaline Phosphatase: 73 U/L (ref 38–126)
BILIRUBIN TOTAL: 0.5 mg/dL (ref 0.3–1.2)
BUN: 13 mg/dL (ref 6–20)
CHLORIDE: 107 mmol/L (ref 101–111)
CO2: 25 mmol/L (ref 22–32)
Calcium: 9.2 mg/dL (ref 8.9–10.3)
Creatinine, Ser: 1 mg/dL (ref 0.44–1.00)
GFR calc Af Amer: >60 mL/min (ref >60)
GFR calc non Af Amer: 56 mL/min — ABNORMAL LOW (ref >60)
GLUCOSE: 111 mg/dL — AB (ref 65–99)
POTASSIUM: 4 mmol/L (ref 3.5–5.1)
Sodium: 140 mmol/L (ref 135–145)
TOTAL PROTEIN: 7.1 g/dL (ref 6.5–8.1)

## 2017-01-17 LAB — LIPASE, BLOOD: Lipase: 21 U/L (ref 11–51)

## 2017-01-17 MED ORDER — IOPAMIDOL (ISOVUE-300) INJECTION 61%
100.0000 mL | Freq: Once | INTRAVENOUS | Status: AC | PRN
  Administered 2017-01-17: 100 mL via INTRAVENOUS

## 2017-01-17 NOTE — ED Provider Notes (Addendum)
[facility] Emergency Department Provider Note  ____________________________________________  Time seen: Approximately 10:54 PM  I have reviewed the triage vital signs and the nursing notes.   HISTORY  Chief Complaint Constipation and Abdominal Pain   HPI [name] is a 71 y.o. female with recently diagnosed colon cancer who presents for evaluation of constipation. Patient reports that she has been unable to have a bowel movement for 7 days. She has had nausea but no vomiting, no abdominal distention, she is passing flatus. She reports that she has been taking several Colace a day. Yesterday she took 2 senna and this morning used a suppository and has been unable to have a bowel movement. She is also complaining of cramping abdominal pain that has been intermittent and in different locations in her abdomen, most recently in the left quadrant, mild. She has had a hysterectomy and a cholecystectomy in the past. No prior history of SBO. She is scheduled for a colectomy next week with Dr. Bary Castilla. No fever, chills, dysuria, hematuria.  Past Medical History:  Diagnosis Date  . Anemia   . Arthritis   . Cancer ([place])   . Carcinoma ([place])   . GERD (gastroesophageal reflux disease)   . Hypothyroidism   . Major depressive disorder, recurrent episode, mild ([place])   . PONV (postoperative nausea and vomiting)   . Sleep apnea    USES CPAP    Patient Active Problem List   Diagnosis Date Noted  . Cancer of right colon ([place]) 12/31/2016  . MDD (major depressive disorder), recurrent episode, severe ([place]) 06/04/2014    Past Surgical History:  Procedure Laterality Date  . ABDOMINAL HYSTERECTOMY  1980  . CHOLECYSTECTOMY    . COLONOSCOPY WITH PROPOFOL N/A 12/28/2016   Procedure: COLONOSCOPY WITH PROPOFOL;  Surgeon: Manya Silvas, MD;  Location[facility] ENDOSCOPY;  Service: Endoscopy;  Laterality: N/A;  . ESOPHAGOGASTRODUODENOSCOPY (EGD) WITH PROPOFOL N/A  12/28/2016   Procedure: ESOPHAGOGASTRODUODENOSCOPY (EGD) WITH PROPOFOL;  Surgeon: Manya Silvas, MD;  Location: [facility] ENDOSCOPY;  Service: Endoscopy;  Laterality: N/A;    Prior to Admission medications   Medication Sig Start Date End Date Taking? Authorizing Provider  cetirizine (ZYRTEC) 10 MG tablet Take 10 mg by mouth daily.    [provider]  docusate sodium (COLACE) 100 MG capsule Take 300 mg by mouth at bedtime.    [provider]  estradiol (ESTRACE) 0.5 MG tablet Take 1 tablet (0.5 mg total) by mouth daily. 07/19/12   Patrecia Pour, NP  lamoTRIgine (LAMICTAL) 200 MG tablet Take 1 tablet (200 mg total) by mouth daily. Patient taking differently: Take 200 mg by mouth every morning.  12/21/16   Arfeen, Arlyce Harman, MD  levothyroxine (SYNTHROID, LEVOTHROID) 88 MCG tablet Take 88 mcg by mouth daily before breakfast.  12/11/14   [provider]  LORazepam (ATIVAN) 1 MG tablet Take 1 mg by mouth at bedtime.  12/21/16   [provider]  methylphenidate (RITALIN) 5 MG tablet Take 1 tablet (5 mg total) by mouth daily. 12/21/16 12/21/17  Arfeen, Arlyce Harman, MD  metroNIDAZOLE (FLAGYL) 500 MG tablet Take one (1) tablet at 6 pm and one (1) tablet at 11 pm the evening prior to surgery. 12/31/16   Robert Bellow, MD  Multiple Vitamins-Minerals (MULTIVITAMIN WITH MINERALS) tablet Take 1 tablet by mouth daily.    [provider]  neomycin (MYCIFRADIN) 500 MG tablet Take two (2) tablets at 6 pm and two (2) tablets at 11  pm the evening prior to surgery. 12/31/16   Robert Bellow, MD  omeprazole (PRILOSEC) 20 MG capsule Take 20 mg by mouth daily before breakfast.    [provider]  ondansetron (ZOFRAN ODT) 4 MG disintegrating tablet Take 1 tablet (4 mg total) by mouth every 8 (eight) hours as needed for nausea or vomiting. Patient not taking: Reported on 01/12/2017 12/24/16   Nena Polio, MD  ondansetron (ZOFRAN-ODT) 8 MG disintegrating tablet Take 8 mg by  mouth every morning. AND PRN    [provider]  polyethylene glycol powder (GLYCOLAX/MIRALAX) powder 255 grams one bottle for bowel prep 12/31/16   Robert Bellow, MD  scopolamine (TRANSDERM-SCOP, 1.5 MG,) 1 MG/3DAYS Place 1 patch (1.5 mg total) onto the skin every 3 (three) days. Place one patch behind one ear the night before surgery. 12/31/16   Robert Bellow, MD  thyroid (ARMOUR) 30 MG tablet Take 30 mg by mouth daily before breakfast.    [provider]  traZODone (DESYREL) 50 MG tablet Take 1 tablet (50 mg total) by mouth at bedtime. 12/21/16   Arfeen, Arlyce Harman, MD  TRINTELLIX 20 MG TABS Take 20 mg by mouth daily. Patient taking differently: Take 20 mg by mouth every morning.  12/21/16   Arfeen, Arlyce Harman, MD    Allergies Other and Codeine  Family History  Problem Relation Age of Onset  . Depression Brother   . Alcohol abuse Brother   . Suicidality Brother   . Stroke Mother   . Neuropathy Mother   . Colon polyps Father   . Bipolar disorder Neg Hx     Social History Social History  Substance Use Topics  . Smoking status: Never Smoker  . Smokeless tobacco: Never Used  . Alcohol use 4.2 oz/week    7 Shots of liquor per week     Comment: RARE     Review of Systems  Constitutional: Negative for fever. Eyes: Negative for visual changes. ENT: Negative for sore throat. Neck: No neck pain  Cardiovascular: Negative for chest pain. Respiratory: Negative for shortness of breath. Gastrointestinal: + abdominal pain, nausea, and constipation. No vomiting  Genitourinary: Negative for dysuria. Musculoskeletal: Negative for back pain. Skin: Negative for rash. Neurological: Negative for headaches, weakness or numbness. Psych: No SI or HI  ____________________________________________   PHYSICAL EXAM:  VITAL SIGNS: ED Triage Vitals  Enc Vitals Group     BP 01/17/17 2111 (!) 139/56     Pulse Rate 01/17/17 2111 94     Resp 01/17/17 2111 18     Temp 01/17/17  2111 98.8 F (37.1 C)     Temp Source 01/17/17 2111 Oral     SpO2 01/17/17 2111 98 %     Weight 01/17/17 2116 175 lb (79.4 kg)     Height 01/17/17 2116 5\' 2"  (1.575 m)     Head Circumference --      Peak Flow --      Pain Score --      Pain Loc --      Pain Edu? --      Excl. in [place]? --     Constitutional: Alert and oriented. Well appearing and in no apparent distress. HEENT:      Head: Normocephalic and atraumatic.         Eyes: Conjunctivae are normal. Sclera is non-icteric.       Mouth/Throat: Mucous membranes are moist.       Neck: Supple with no signs  of meningismus. Cardiovascular: Regular rate and rhythm. No murmurs, gallops, or rubs. 2+ symmetrical distal pulses are present in all extremities. No JVD. Respiratory: Normal respiratory effort. Lungs are clear to auscultation bilaterally. No wheezes, crackles, or rhonchi.  Gastrointestinal: Soft, non tender, and non distended with positive bowel sounds. No rebound or guarding. Genitourinary: No CVA tenderness. Musculoskeletal: Nontender with normal range of motion in all extremities. No edema, cyanosis, or erythema of extremities. Neurologic: Normal speech and language. Face is symmetric. Moving all extremities. No gross focal neurologic deficits are appreciated. Skin: Skin is warm, dry and intact. No rash noted. Psychiatric: Mood and affect are normal. Speech and behavior are normal.  ____________________________________________   LABS (all labs ordered are listed, but only abnormal results are displayed)  Labs Reviewed  COMPREHENSIVE METABOLIC PANEL - Abnormal; Notable for the following:       Result Value   Glucose, Bld 111 (*)    GFR calc non Af Amer 56 (*)    All other components within normal limits  CBC - Abnormal; Notable for the following:    WBC 11.6 (*)    Hemoglobin 10.8 (*)    HCT 32.1 (*)    RDW 18.6 (*)    All other components within normal limits  LIPASE, BLOOD  URINALYSIS, COMPLETE (UACMP) WITH  MICROSCOPIC   ____________________________________________  EKG  none  ____________________________________________  RADIOLOGY  KUB:  Nonobstructed bowel-gas pattern ____________________________________________   PROCEDURES  Procedure(s) performed: None Procedures Critical Care performed:  None ____________________________________________   INITIAL IMPRESSION / ASSESSMENT AND PLAN / ED COURSE   71 y.o. female with recently diagnosed colon cancer who presents for evaluation of constipation x 7 days. Patient has no distention, no vomiting, positive bowel sounds, clinically no evidence of SBO. She has a KUB showing mild stool burden mostly on the descending colon. Labs WNL. Will do an enema and reassess.    _________________________ 11:31 PM on 01/17/2017 -----------------------------------------  Patient had small BM after enema and still complaining of LLQ abdominal pain. CT a/p ordered. Care transferred to Dr. Kerman Passey  Pertinent labs & imaging results that were available during my care of the patient were reviewed by me and considered in my medical decision making (see chart for details).    ____________________________________________   FINAL CLINICAL IMPRESSION(S) / ED DIAGNOSES  Final diagnoses:  Constipation, unspecified constipation type  LLQ abdominal pain      NEW MEDICATIONS STARTED DURING THIS VISIT:  New Prescriptions   No medications on file     Note:  This document was prepared using Dragon voice recognition software and may include unintentional dictation errors.    Rudene Re, MD 01/17/17 [place], [place], [place] 01/17/17 [phone]

## 2017-01-17 NOTE — ED Notes (Signed)
No results from enema, dr. veronese notified.  

## 2017-01-17 NOTE — Discharge Instructions (Addendum)
Constipation: Take colace twice a day everyday. Take senna once a day at bedtime. Take daily probiotics. Drink plenty of fluids and eat a diet rich in fiber. If you go more than 3 days without a bowel movement, take 1 cap full of Miralax in the morning and one in the evening up to 5 days.   Return to the ER if you start to vomit, if you develop abdominal distention, or if you have new or worsening abdominal pain.

## 2017-01-17 NOTE — ED Notes (Signed)
Soaps suds enema administered.

## 2017-01-17 NOTE — ED Triage Notes (Signed)
Pt c/o constipation for 1 week; abd pain with nausea since yesterday; has tried multiple OTC meds and home remedies with no relief; pt was diagnosed with colon CA less than 3 weeks ago and is to have a colon resection Thursday performed by Dr Bary Castilla

## 2017-01-18 ENCOUNTER — Telehealth: Payer: Self-pay | Admitting: General Surgery

## 2017-01-18 DIAGNOSIS — K5730 Diverticulosis of large intestine without perforation or abscess without bleeding: Secondary | ICD-10-CM | POA: Diagnosis not present

## 2017-01-18 NOTE — ED Notes (Signed)
Pt's spouse updated on delay.

## 2017-01-18 NOTE — ED Notes (Signed)
Pt denies needs, updated on progression of ct results.

## 2017-01-18 NOTE — Telephone Encounter (Signed)
The patient had called the on-call physician yesterday reporting no bowel movements for a week. She declined use an enema at home and instead went to the hospital. Plain films and CT were unremarkable for obstruction. She had an enema there without result and was discharged home. She did have a excellent result from the enema after returning home. Laboratory studies notable only for scant elevation of white blood cell count. Hemoglobin stable.  Reports feeling fine at this time. We'll plan to proceed with scheduled laparoscopically assisted right hemicolectomy on Thursday, September 20.

## 2017-01-18 NOTE — ED Provider Notes (Signed)
-----------------------------------------   1:56 AM on 01/18/2017 -----------------------------------------  IMPRESSION: 1. Negative for a bowel obstruction. 2. Irregular wall thickening of the cecum and ileocecal region presumably corresponding to the patient's history of colon cancer 3. Sigmoid colon diverticular disease without acute inflammation 4. Subcentimeter hypodense lesion in the left hepatic lobe too small to further characterize  patient's CT is largely nonrevealing. Liquid stool in the large intestine however this CT was performed after an enema was given. No large bowel movement since the enema however there is not a large amount of stool retention seen on CT. Patient's pain she states is a 2 or 3/10 currently. Patient wishes to go home. We will have her follow up with her doctor. I discussed return precautions for any worsening pain.   Harvest Dark, MD 01/18/17 0157

## 2017-01-21 ENCOUNTER — Encounter: Payer: Self-pay | Admitting: *Deleted

## 2017-01-21 ENCOUNTER — Inpatient Hospital Stay
Admission: RE | Admit: 2017-01-21 | Discharge: 2017-01-24 | DRG: 331 | Disposition: A | Discharge status: Home / Self Care | Payer: Medicare Other | Source: Ambulatory Visit | Attending: General Surgery | Admitting: General Surgery

## 2017-01-21 ENCOUNTER — Inpatient Hospital Stay: Payer: Medicare Other | Admitting: Anesthesiology

## 2017-01-21 ENCOUNTER — Encounter
Admission: RE | Disposition: A | Discharge status: Home / Self Care | Payer: Self-pay | Source: Ambulatory Visit | Attending: General Surgery

## 2017-01-21 DIAGNOSIS — M1990 Unspecified osteoarthritis, unspecified site: Secondary | ICD-10-CM | POA: Diagnosis not present

## 2017-01-21 DIAGNOSIS — C180 Malignant neoplasm of cecum: Secondary | ICD-10-CM | POA: Diagnosis not present

## 2017-01-21 DIAGNOSIS — C182 Malignant neoplasm of ascending colon: Secondary | ICD-10-CM

## 2017-01-21 DIAGNOSIS — K219 Gastro-esophageal reflux disease without esophagitis: Secondary | ICD-10-CM | POA: Diagnosis not present

## 2017-01-21 DIAGNOSIS — C188 Malignant neoplasm of overlapping sites of colon: Secondary | ICD-10-CM | POA: Diagnosis not present

## 2017-01-21 DIAGNOSIS — K388 Other specified diseases of appendix: Secondary | ICD-10-CM | POA: Diagnosis not present

## 2017-01-21 DIAGNOSIS — E039 Hypothyroidism, unspecified: Secondary | ICD-10-CM | POA: Diagnosis not present

## 2017-01-21 DIAGNOSIS — C189 Malignant neoplasm of colon, unspecified: Secondary | ICD-10-CM | POA: Diagnosis present

## 2017-01-21 DIAGNOSIS — F329 Major depressive disorder, single episode, unspecified: Secondary | ICD-10-CM | POA: Diagnosis not present

## 2017-01-21 HISTORY — PX: LAPAROSCOPIC RIGHT COLECTOMY: SHX5925

## 2017-01-21 LAB — CBC
HEMATOCRIT: 29.2 % — AB (ref 35.0–47.0)
Hemoglobin: 9.6 g/dL — ABNORMAL LOW (ref 12.0–16.0)
MCH: 27.7 pg (ref 26.0–34.0)
MCHC: 32.9 g/dL (ref 32.0–36.0)
MCV: 84 fL (ref 80.0–100.0)
PLATELETS: 354 10*3/uL (ref 150–440)
RBC: 3.47 MIL/uL — ABNORMAL LOW (ref 3.80–5.20)
RDW: 18.1 % — AB (ref 11.5–14.5)
WBC: 15.1 10*3/uL — ABNORMAL HIGH (ref 3.6–11.0)

## 2017-01-21 LAB — CREATININE, SERUM
Creatinine, Ser: 0.92 mg/dL (ref 0.44–1.00)
GFR calc non Af Amer: >60 mL/min (ref >60)

## 2017-01-21 SURGERY — COLECTOMY, RIGHT, LAPAROSCOPIC
Anesthesia: General | Laterality: Right | Wound class: Clean Contaminated

## 2017-01-21 MED ORDER — TRAZODONE HCL 50 MG PO TABS
50.0000 mg | ORAL_TABLET | Freq: Every day | ORAL | Status: DC
  Administered 2017-01-21 – 2017-01-23 (×3): 50 mg via ORAL
  Filled 2017-01-21 (×3): qty 1

## 2017-01-21 MED ORDER — ACETAMINOPHEN 10 MG/ML IV SOLN
INTRAVENOUS | Status: DC | PRN
  Administered 2017-01-21: 1000 mg via INTRAVENOUS

## 2017-01-21 MED ORDER — PROPOFOL 10 MG/ML IV BOLUS
INTRAVENOUS | Status: AC
  Filled 2017-01-21: qty 20

## 2017-01-21 MED ORDER — SEVOFLURANE IN SOLN
RESPIRATORY_TRACT | Status: AC
  Filled 2017-01-21: qty 250

## 2017-01-21 MED ORDER — MIDAZOLAM HCL 2 MG/2ML IJ SOLN
INTRAMUSCULAR | Status: AC
  Filled 2017-01-21: qty 2

## 2017-01-21 MED ORDER — LORAZEPAM 1 MG PO TABS
1.0000 mg | ORAL_TABLET | Freq: Every day | ORAL | Status: DC
  Administered 2017-01-21 – 2017-01-23 (×3): 1 mg via ORAL
  Filled 2017-01-21 (×3): qty 1

## 2017-01-21 MED ORDER — FENTANYL CITRATE (PF) 100 MCG/2ML IJ SOLN
INTRAMUSCULAR | Status: AC
  Filled 2017-01-21: qty 2

## 2017-01-21 MED ORDER — SUCCINYLCHOLINE CHLORIDE 20 MG/ML IJ SOLN
INTRAMUSCULAR | Status: AC
  Filled 2017-01-21: qty 1

## 2017-01-21 MED ORDER — KETOROLAC TROMETHAMINE 30 MG/ML IJ SOLN
INTRAMUSCULAR | Status: DC | PRN
  Administered 2017-01-21: 30 mg via INTRAVENOUS

## 2017-01-21 MED ORDER — LORATADINE 10 MG PO TABS
10.0000 mg | ORAL_TABLET | Freq: Every day | ORAL | Status: DC
  Administered 2017-01-23 – 2017-01-24 (×2): 10 mg via ORAL
  Filled 2017-01-21 (×2): qty 1

## 2017-01-21 MED ORDER — METOCLOPRAMIDE HCL 5 MG/ML IJ SOLN
INTRAMUSCULAR | Status: AC
  Filled 2017-01-21: qty 2

## 2017-01-21 MED ORDER — ENOXAPARIN SODIUM 40 MG/0.4ML ~~LOC~~ SOLN
40.0000 mg | SUBCUTANEOUS | Status: DC
  Administered 2017-01-22 – 2017-01-24 (×3): 40 mg via SUBCUTANEOUS
  Filled 2017-01-21 (×3): qty 0.4

## 2017-01-21 MED ORDER — VORTIOXETINE HBR 20 MG PO TABS
20.0000 mg | ORAL_TABLET | ORAL | Status: DC
  Administered 2017-01-22 – 2017-01-24 (×3): 20 mg via ORAL
  Filled 2017-01-21 (×3): qty 20

## 2017-01-21 MED ORDER — BUPIVACAINE-EPINEPHRINE (PF) 0.5% -1:200000 IJ SOLN
INTRAMUSCULAR | Status: DC | PRN
  Administered 2017-01-21: 30 mL via PERINEURAL

## 2017-01-21 MED ORDER — LIDOCAINE 2% (20 MG/ML) 5 ML SYRINGE
INTRAMUSCULAR | Status: DC | PRN
  Administered 2017-01-21: 100 mg via INTRAVENOUS

## 2017-01-21 MED ORDER — MORPHINE SULFATE (PF) 2 MG/ML IV SOLN
2.0000 mg | INTRAVENOUS | Status: DC | PRN
  Administered 2017-01-21 (×2): 2 mg via INTRAVENOUS
  Filled 2017-01-21 (×2): qty 1

## 2017-01-21 MED ORDER — PROPOFOL 10 MG/ML IV BOLUS
INTRAVENOUS | Status: DC | PRN
  Administered 2017-01-21: 150 mg via INTRAVENOUS

## 2017-01-21 MED ORDER — FENTANYL CITRATE (PF) 250 MCG/5ML IJ SOLN
INTRAMUSCULAR | Status: AC
  Filled 2017-01-21: qty 5

## 2017-01-21 MED ORDER — METOCLOPRAMIDE HCL 5 MG/ML IJ SOLN
10.0000 mg | Freq: Once | INTRAMUSCULAR | Status: AC
  Administered 2017-01-21: 10 mg via INTRAVENOUS

## 2017-01-21 MED ORDER — ONDANSETRON HCL 4 MG/2ML IJ SOLN: 4.0000 mg | Freq: Once | INTRAMUSCULAR | Status: DC | PRN

## 2017-01-21 MED ORDER — ONDANSETRON HCL 4 MG/2ML IJ SOLN
4.0000 mg | Freq: Once | INTRAMUSCULAR | Status: AC
  Administered 2017-01-21: 4 mg via INTRAVENOUS

## 2017-01-21 MED ORDER — SUGAMMADEX SODIUM 200 MG/2ML IV SOLN
INTRAVENOUS | Status: DC | PRN
  Administered 2017-01-21: 160 mg via INTRAVENOUS

## 2017-01-21 MED ORDER — PROMETHAZINE HCL 25 MG/ML IJ SOLN
12.5000 mg | INTRAMUSCULAR | Status: DC | PRN
  Administered 2017-01-21: 12.5 mg via INTRAVENOUS
  Filled 2017-01-21 (×2): qty 1

## 2017-01-21 MED ORDER — DEXAMETHASONE SODIUM PHOSPHATE 10 MG/ML IJ SOLN
INTRAMUSCULAR | Status: AC
  Filled 2017-01-21: qty 1

## 2017-01-21 MED ORDER — LACTATED RINGERS IV SOLN
INTRAVENOUS | Status: DC
  Administered 2017-01-21: 07:00:00 via INTRAVENOUS
  Administered 2017-01-21: 1000 mL via INTRAVENOUS
  Administered 2017-01-21: 07:00:00 via INTRAVENOUS

## 2017-01-21 MED ORDER — FENTANYL CITRATE (PF) 100 MCG/2ML IJ SOLN
25.0000 ug | INTRAMUSCULAR | Status: DC | PRN
  Administered 2017-01-21: 25 ug via INTRAVENOUS

## 2017-01-21 MED ORDER — LEVOTHYROXINE SODIUM 88 MCG PO TABS
88.0000 ug | ORAL_TABLET | Freq: Every day | ORAL | Status: DC
  Administered 2017-01-22 – 2017-01-24 (×3): 88 ug via ORAL
  Filled 2017-01-21 (×3): qty 1

## 2017-01-21 MED ORDER — FENTANYL CITRATE (PF) 100 MCG/2ML IJ SOLN
INTRAMUSCULAR | Status: DC | PRN
  Administered 2017-01-21: 100 ug via INTRAVENOUS
  Administered 2017-01-21: 50 ug via INTRAVENOUS
  Administered 2017-01-21: 100 ug via INTRAVENOUS
  Administered 2017-01-21 (×2): 50 ug via INTRAVENOUS

## 2017-01-21 MED ORDER — THYROID 30 MG PO TABS
30.0000 mg | ORAL_TABLET | Freq: Every day | ORAL | Status: DC
  Administered 2017-01-22 – 2017-01-24 (×3): 30 mg via ORAL
  Filled 2017-01-21 (×3): qty 1

## 2017-01-21 MED ORDER — DEXMEDETOMIDINE HCL 200 MCG/2ML IV SOLN
INTRAVENOUS | Status: DC | PRN
  Administered 2017-01-21: 8 ug via INTRAVENOUS

## 2017-01-21 MED ORDER — ALVIMOPAN 12 MG PO CAPS
12.0000 mg | ORAL_CAPSULE | Freq: Two times a day (BID) | ORAL | Status: DC
  Administered 2017-01-21 – 2017-01-23 (×5): 12 mg via ORAL
  Filled 2017-01-21 (×6): qty 1

## 2017-01-21 MED ORDER — PANTOPRAZOLE SODIUM 40 MG PO TBEC
40.0000 mg | DELAYED_RELEASE_TABLET | Freq: Every day | ORAL | Status: DC
  Administered 2017-01-22 – 2017-01-24 (×3): 40 mg via ORAL
  Filled 2017-01-21 (×3): qty 1

## 2017-01-21 MED ORDER — ROCURONIUM BROMIDE 100 MG/10ML IV SOLN
INTRAVENOUS | Status: DC | PRN
  Administered 2017-01-21: 10 mg via INTRAVENOUS
  Administered 2017-01-21: 30 mg via INTRAVENOUS

## 2017-01-21 MED ORDER — ONDANSETRON HCL 4 MG/2ML IJ SOLN
INTRAMUSCULAR | Status: DC | PRN
  Administered 2017-01-21: 4 mg via INTRAVENOUS

## 2017-01-21 MED ORDER — ESTRADIOL 1 MG PO TABS
0.5000 mg | ORAL_TABLET | Freq: Every day | ORAL | Status: DC
  Administered 2017-01-22 – 2017-01-24 (×3): 0.5 mg via ORAL
  Filled 2017-01-21 (×4): qty 0.5

## 2017-01-21 MED ORDER — BUPIVACAINE-EPINEPHRINE (PF) 0.5% -1:200000 IJ SOLN
INTRAMUSCULAR | Status: AC
  Filled 2017-01-21: qty 30

## 2017-01-21 MED ORDER — FENTANYL CITRATE (PF) 100 MCG/2ML IJ SOLN
INTRAMUSCULAR | Status: AC
  Administered 2017-01-21: 12:00:00
  Filled 2017-01-21: qty 2

## 2017-01-21 MED ORDER — ALVIMOPAN 12 MG PO CAPS
ORAL_CAPSULE | ORAL | Status: AC
  Filled 2017-01-21: qty 1

## 2017-01-21 MED ORDER — ALVIMOPAN 12 MG PO CAPS
12.0000 mg | ORAL_CAPSULE | ORAL | Status: AC
  Administered 2017-01-21: 12 mg via ORAL

## 2017-01-21 MED ORDER — ONDANSETRON HCL 4 MG/2ML IJ SOLN
INTRAMUSCULAR | Status: AC
  Filled 2017-01-21: qty 2

## 2017-01-21 MED ORDER — ONDANSETRON 4 MG PO TBDP
4.0000 mg | ORAL_TABLET | ORAL | Status: DC | PRN
  Administered 2017-01-21 – 2017-01-23 (×4): 4 mg via ORAL
  Filled 2017-01-21 (×4): qty 1

## 2017-01-21 MED ORDER — SUGAMMADEX SODIUM 200 MG/2ML IV SOLN
INTRAVENOUS | Status: AC
  Filled 2017-01-21: qty 2

## 2017-01-21 MED ORDER — ACETAMINOPHEN 10 MG/ML IV SOLN
INTRAVENOUS | Status: AC
  Filled 2017-01-21: qty 100

## 2017-01-21 MED ORDER — ROCURONIUM BROMIDE 50 MG/5ML IV SOLN
INTRAVENOUS | Status: AC
  Filled 2017-01-21: qty 1

## 2017-01-21 MED ORDER — LAMOTRIGINE 100 MG PO TABS
200.0000 mg | ORAL_TABLET | ORAL | Status: DC
  Administered 2017-01-22 – 2017-01-24 (×3): 200 mg via ORAL
  Filled 2017-01-21 (×4): qty 2

## 2017-01-21 MED ORDER — METHYLPHENIDATE HCL 10 MG PO TABS
5.0000 mg | ORAL_TABLET | Freq: Every day | ORAL | Status: DC
  Administered 2017-01-22 – 2017-01-24 (×3): 5 mg via ORAL
  Filled 2017-01-21 (×3): qty 1

## 2017-01-21 MED ORDER — ACETAMINOPHEN 10 MG/ML IV SOLN
1000.0000 mg | Freq: Four times a day (QID) | INTRAVENOUS | Status: AC
  Administered 2017-01-21 – 2017-01-22 (×4): 1000 mg via INTRAVENOUS
  Filled 2017-01-21 (×4): qty 100

## 2017-01-21 MED ORDER — PHENYLEPHRINE HCL 10 MG/ML IJ SOLN
INTRAMUSCULAR | Status: DC | PRN
  Administered 2017-01-21: 80 ug via INTRAVENOUS

## 2017-01-21 MED ORDER — SODIUM CHLORIDE 0.9 % IV SOLN
1.0000 g | INTRAVENOUS | Status: AC
  Administered 2017-01-21: 1 g via INTRAVENOUS
  Filled 2017-01-21: qty 1

## 2017-01-21 MED ORDER — SUCCINYLCHOLINE CHLORIDE 20 MG/ML IJ SOLN
INTRAMUSCULAR | Status: DC | PRN
  Administered 2017-01-21: 100 mg via INTRAVENOUS

## 2017-01-21 MED ORDER — MIDAZOLAM HCL 2 MG/2ML IJ SOLN
INTRAMUSCULAR | Status: DC | PRN
  Administered 2017-01-21 (×2): 1 mg via INTRAVENOUS

## 2017-01-21 MED ORDER — DEXAMETHASONE SODIUM PHOSPHATE 10 MG/ML IJ SOLN
INTRAMUSCULAR | Status: DC | PRN
  Administered 2017-01-21: 10 mg via INTRAVENOUS

## 2017-01-21 MED ORDER — OXYCODONE HCL 5 MG PO TABS
5.0000 mg | ORAL_TABLET | ORAL | Status: DC | PRN
  Administered 2017-01-22 – 2017-01-23 (×6): 5 mg via ORAL
  Filled 2017-01-21 (×7): qty 1

## 2017-01-21 SURGICAL SUPPLY — 75 items
APPLIER CLIP ROT 10 11.4 M/L (STAPLE)
BLADE SURG 10 STRL SS SAFETY (BLADE) ×2 IMPLANT
BLADE SURG 11 STRL SS SAFETY (MISCELLANEOUS) ×2 IMPLANT
CANISTER SUCT 1200ML W/VALVE (MISCELLANEOUS) ×2 IMPLANT
CANNULA DILATOR 10 W/SLV (CANNULA) ×4 IMPLANT
CHLORAPREP W/TINT 26ML (MISCELLANEOUS) ×2 IMPLANT
CLIP APPLIE ROT 10 11.4 M/L (STAPLE) IMPLANT
COVER CLAMP SIL LG PBX B (MISCELLANEOUS) IMPLANT
DEVICE HAND ACCESS DEXTUS (MISCELLANEOUS) IMPLANT
DRAPE LAP W/FLUID (DRAPES) IMPLANT
DRAPE UNDER BUTTOCK W/FLU (DRAPES) IMPLANT
DRSG OPSITE POSTOP 4X10 (GAUZE/BANDAGES/DRESSINGS) IMPLANT
DRSG OPSITE POSTOP 4X6 (GAUZE/BANDAGES/DRESSINGS) ×2 IMPLANT
DRSG OPSITE POSTOP 4X8 (GAUZE/BANDAGES/DRESSINGS) IMPLANT
DRSG TEGADERM 2-3/8X2-3/4 SM (GAUZE/BANDAGES/DRESSINGS) ×4 IMPLANT
DRSG TEGADERM 4X4.75 (GAUZE/BANDAGES/DRESSINGS) IMPLANT
DRSG TELFA 3X8 NADH (GAUZE/BANDAGES/DRESSINGS) ×2 IMPLANT
ELECT BLADE 6.5 EXT (BLADE) ×2 IMPLANT
ELECT CAUTERY BLADE 6.4 (BLADE) ×2 IMPLANT
ELECT REM PT RETURN 9FT ADLT (ELECTROSURGICAL) ×2
ELECTRODE REM PT RTRN 9FT ADLT (ELECTROSURGICAL) ×1 IMPLANT
FILTER LAP SMOKE EVAC STRL (MISCELLANEOUS) ×2 IMPLANT
GLOVE BIO SURGEON STRL SZ7 (GLOVE) IMPLANT
GLOVE BIO SURGEON STRL SZ7.5 (GLOVE) ×16 IMPLANT
GLOVE INDICATOR 8.0 STRL GRN (GLOVE) ×16 IMPLANT
GOWN STRL REUS W/ TWL LRG LVL3 (GOWN DISPOSABLE) ×8 IMPLANT
GOWN STRL REUS W/TWL LRG LVL3 (GOWN DISPOSABLE) ×8
HANDLE YANKAUER SUCT BULB TIP (MISCELLANEOUS) ×2 IMPLANT
HOLDER FOLEY CATH W/STRAP (MISCELLANEOUS) IMPLANT
IRRIGATION STRYKERFLOW (MISCELLANEOUS) IMPLANT
IRRIGATOR STRYKERFLOW (MISCELLANEOUS)
IV LACTATED RINGERS 1000ML (IV SOLUTION) IMPLANT
KIT PINK PAD W/HEAD ARE REST (MISCELLANEOUS) ×2
KIT PINK PAD W/HEAD ARM REST (MISCELLANEOUS) ×1 IMPLANT
KIT RM TURNOVER STRD PROC AR (KITS) ×2 IMPLANT
LABEL OR SOLS (LABEL) IMPLANT
NDL INSUFF ACCESS 14 VERSASTEP (NEEDLE) ×2 IMPLANT
NEEDLE HYPO 22GX1.5 SAFETY (NEEDLE) ×2 IMPLANT
NS IRRIG 500ML POUR BTL (IV SOLUTION) ×2 IMPLANT
PACK COLON CLEAN CLOSURE (MISCELLANEOUS) ×2 IMPLANT
PACK LAP CHOLECYSTECTOMY (MISCELLANEOUS) ×2 IMPLANT
PAD PREP 24X41 OB/GYN DISP (PERSONAL CARE ITEMS) IMPLANT
PENCIL ELECTRO HAND CTR (MISCELLANEOUS) ×2 IMPLANT
PROT DEXTUS HAND ACCESS (MISCELLANEOUS)
RELOAD PROXIMATE 75MM BLUE (ENDOMECHANICALS) ×2 IMPLANT
RETAINER VISCERA MED (MISCELLANEOUS) ×2 IMPLANT
RETRACTOR FIXED LENGTH SML (MISCELLANEOUS) IMPLANT
RETRACTOR WOUND ALXS 18CM MED (MISCELLANEOUS) IMPLANT
RTRCTR WOUND ALEXIS O 18CM MED (MISCELLANEOUS)
SCISSORS METZENBAUM CVD 33 (INSTRUMENTS) ×2 IMPLANT
SEAL FOR SCOPE WARMER C3101 (MISCELLANEOUS) IMPLANT
SET YANKAUER POOLE SUCT (MISCELLANEOUS) ×2 IMPLANT
SHEARS HARMONIC ACE PLUS 36CM (ENDOMECHANICALS) IMPLANT
SPONGE LAP 18X18 5 PK (GAUZE/BANDAGES/DRESSINGS) ×2 IMPLANT
STAPLER PROXIMATE 75MM BLUE (STAPLE) ×2 IMPLANT
STRIP CLOSURE SKIN 1/2X4 (GAUZE/BANDAGES/DRESSINGS) ×2 IMPLANT
SUT PROLENE 0 CT 1 30 (SUTURE) ×4 IMPLANT
SUT SILK 2 0 (SUTURE) ×1
SUT SILK 2-0 30XBRD TIE 12 (SUTURE) ×1 IMPLANT
SUT SILK 3-0 (SUTURE) ×4 IMPLANT
SUT VIC AB 2-0 BRD 54 (SUTURE) IMPLANT
SUT VIC AB 2-0 CT1 27 (SUTURE) ×1
SUT VIC AB 2-0 CT1 TAPERPNT 27 (SUTURE) ×1 IMPLANT
SUT VIC AB 3-0 54X BRD REEL (SUTURE) IMPLANT
SUT VIC AB 3-0 BRD 54 (SUTURE)
SUT VIC AB 3-0 SH 27 (SUTURE) ×2
SUT VIC AB 3-0 SH 27X BRD (SUTURE) ×2 IMPLANT
SUT VIC AB 4-0 FS2 27 (SUTURE) IMPLANT
SUT VIC AB 4-0 SH 27 (SUTURE) ×2
SUT VIC AB 4-0 SH 27XANBCTRL (SUTURE) ×2 IMPLANT
TRAY FOLEY W/METER SILVER 16FR (SET/KITS/TRAYS/PACK) IMPLANT
TROCAR XCEL NON-BLD 11X100MML (ENDOMECHANICALS) ×2 IMPLANT
TROCAR XCEL UNIV SLVE 11M 100M (ENDOMECHANICALS) ×4 IMPLANT
TUBING INSUF HEATED (TUBING) ×2 IMPLANT
WATER STERILE IRR 1000ML POUR (IV SOLUTION) IMPLANT

## 2017-01-21 NOTE — Anesthesia Post-op Follow-up Note (Signed)
Anesthesia QCDR form completed.        

## 2017-01-21 NOTE — Transfer of Care (Signed)
Immediate Anesthesia Transfer of Care Note  Patient: [name]  Procedure(s) Performed: Procedure(s): LAPAROSCOPIC ASSISTED RIGHT COLECTOMY (Right)  Patient Location: PACU  Anesthesia Type:General  Level of Consciousness: awake, alert  and oriented  Airway & Oxygen Therapy: Patient Spontanous Breathing and Patient connected to nasal cannula oxygen  Post-op Assessment: Report given to RN and Post -op Vital signs reviewed and stable  Post vital signs: Reviewed and stable  Last Vitals:  Vitals:   01/21/17 0607  BP: (!) 136/55  Pulse: 96  Resp: 18  Temp: 37.4 C  SpO2: 97%    Last Pain:  Vitals:   01/21/17 0607  TempSrc: Tympanic         Complications: No apparent anesthesia complications

## 2017-01-21 NOTE — Op Note (Signed)
Preoperative diagnosis: Carcinoma the cecum.  Postoperative diagnosis: Same.  Operative procedure: Laparoscopically assisted right hemicolectomy with ileotransverse colostomy.  Operating surgeon: Hervey Ard, M.D.  Assistant: Arvilla Meres, RN FA  Anesthesia: Gen. endotracheal.  Estimated blood loss: 30 mL.  Clinical note: This 71 year old woman was noted to have a mass in the right colon on CT and colonoscopy confirmed malignancy. She was a candidate for right colectomy.  The patient received Invanz prior to the procedure. Oral Flagyl and neomycin was administered, but the patient did have nausea and vomiting tonight prior to surgery. SCD stockings for DVT prevention.  Operative note: With the patient under adequate general endotracheal anesthesia and the left arm carefully tucked the abdomen was prepped with ChloraPrep and draped. An Trendelenburg position a varies needle was placed with transmural incision. After assuring intra-abdominal location with a hanging drop test the abdomen was insufflated with CO2 at 10 mmHg pressure. A 10 mm Port was placed. Inspection showed no evidence of injury from initial port placement. There were 2 lesions in the hypogastrium and in the area of the cecum. The abdomen was otherwise unremarkable. No evidence of peritoneal disease. No visualized abnormality of the left lobe of the liver where CT scan had suggested a cystic lesion within the parenchyma. A 10 mm Step port was placed to the left of the falciform ligament and an 11 mm XL port placed in the left lateral abdomen. The omentum was swept above the stomach and the attachments to the colon divided with the LigaSure device. This was swept down to the base of the mesentery. The duodenum was visualized. Continued dissection laterally freed the hepatic flexure and then the white line of Toldt was divided in a similar fashion. The appendix was adherent in the top of the pelvis and this was freed  circumferentially. The ileum was free and this could be brought all the way to the left of the midline. The band of adhesions near the cecum were fairly filmy and did not suggest tumor invasion. The right colon was rolled to the left of the midline and at this point the pneumoperitoneum was released and an 8 cm incision was made above the level of the umbilicus. Marcaine was infiltrated into the wound for postoperative analgesia. The fascia was incised with cutting current and palpation of the liver was unremarkable. A medium Alexis wound protector was placed. The right colon easily mobilized into the wound. The mesentery was divided with the Harmonic scalpel with the exception of the right colic vessels which were ligated with 2-0 silk ties. A side-to-side functional end-to-end anastomosis was completed with the GIA 75 mm stapler. A second firing perpendicular to the first completed the anastomosis. Good hemostasis was noted. The crotch and ends of the staple line were reinforced with 3-0 Vicryl figure-of-eight sutures. The mesenteric defect was closed with a running 3-0 Vicryl suture. The abdomen was irrigated with warm saline. No bleeding was noted. The surgeon's gown and gloves were changed and the field was redraped with towels.  Fashion was approximated with interrupted 0 Prolene figure-of-eight sutures. The adipose layer was approximated with a running 2-0 Vicryl suture. The skin incisions were closed with running 4-0 Vicryl subcuticular sutures. Benzoin and Steri-Strips were applied to the port sites and a honeycomb dressing applied to the colon extraction site.  The patient tolerated the procedure well and was taken recovery room in stable condition.

## 2017-01-21 NOTE — OR Nursing (Signed)
Pt took 1st part of prep at 5pm  and took antibiotic at 6:00pm and started vomiting at 6:30 pm took second part of prep at 8 pm slept all night took medications this am and starting vomiting again and is vomiting upon arrival at hospital- pt put scopolamine pt on about 5:15 pm then took it off and put it on about 5:20am

## 2017-01-21 NOTE — H&P (Signed)
For right colectomy for cancer. N/V since taking antibiotics last night. Will RX w/ Reglan and Zofran IV so she can take Entereg preop. Anticipate prep will be adequate for planned right colectomy.  No recurrent obstipation since past weekends events.

## 2017-01-21 NOTE — Anesthesia Procedure Notes (Signed)
Procedure Name: Intubation Date/Time: 01/21/2017 7:30 AM Performed by: Marsh Dolly Pre-anesthesia Checklist: Patient identified, Patient being monitored, Timeout performed, Emergency Drugs available and Suction available Patient Re-evaluated:Patient Re-evaluated prior to induction Oxygen Delivery Method: Circle system utilized Preoxygenation: Pre-oxygenation with 100% oxygen Induction Type: IV induction, Rapid sequence and Cricoid Pressure applied Laryngoscope Size: 3 and Miller Grade View: Grade I Tube type: Oral Tube size: 7.5 mm Number of attempts: 1 Airway Equipment and Method: Stylet Placement Confirmation: ETT inserted through vocal cords under direct vision,  positive ETCO2 and breath sounds checked- equal and bilateral Secured at: 21 cm Tube secured with: Tape Dental Injury: Teeth and Oropharynx as per pre-operative assessment

## 2017-01-21 NOTE — Anesthesia Postprocedure Evaluation (Signed)
Anesthesia Post Note  Patient: [name]  Procedure(s) Performed: Procedure(s) (LRB): LAPAROSCOPIC ASSISTED RIGHT COLECTOMY (Right)  Patient location during evaluation: PACU Anesthesia Type: General Level of consciousness: awake and alert and oriented Pain management: pain level controlled Vital Signs Assessment: post-procedure vital signs reviewed and stable Respiratory status: spontaneous breathing Cardiovascular status: blood pressure returned to baseline Anesthetic complications: no     Last Vitals:  Vitals:   01/21/17 1020 01/21/17 1107  BP: (!) 117/51 (!) 110/46  Pulse: 70 71  Resp: 16 16  Temp:  36.9 C  SpO2: 100% 98%    Last Pain:  Vitals:   01/21/17 1107  TempSrc: Oral  PainSc:                  Essence Merle

## 2017-01-21 NOTE — Anesthesia Preprocedure Evaluation (Signed)
Anesthesia Evaluation  Patient identified by MRN, date of birth, ID band Patient awake    Reviewed: Allergy & Precautions, NPO status , Patient's Chart, lab work & pertinent test results  History of Anesthesia Complications (+) PONV and history of anesthetic complications  Airway Mallampati: II  TM Distance: <3 FB     Dental  (+) Caps, Chipped   Pulmonary sleep apnea and Continuous Positive Airway Pressure Ventilation ,    Pulmonary exam normal        Cardiovascular negative cardio ROS Normal cardiovascular exam     Neuro/Psych PSYCHIATRIC DISORDERS Depression    GI/Hepatic Neg liver ROS, GERD  Medicated,  Endo/Other  Hypothyroidism   Renal/GU negative Renal ROS  negative genitourinary   Musculoskeletal negative musculoskeletal ROS (+) Arthritis , Osteoarthritis,    Abdominal Normal abdominal exam  (+)   Peds negative pediatric ROS (+)  Hematology negative hematology ROS (+) anemia ,   Anesthesia Other Findings   Reproductive/Obstetrics                             Anesthesia Physical  Anesthesia Plan  ASA: III  Anesthesia Plan: General   Post-op Pain Management:    Induction: Intravenous, Rapid sequence and Cricoid pressure planned  PONV Risk Score and Plan:   Airway Management Planned: Oral ETT  Additional Equipment:   Intra-op Plan:   Post-operative Plan: Extubation in OR  Informed Consent: I have reviewed the patients History and Physical, chart, labs and discussed the procedure including the risks, benefits and alternatives for the proposed anesthesia with the patient or authorized representative who has indicated his/her understanding and acceptance.   Dental advisory given  Plan Discussed with: CRNA and Surgeon  Anesthesia Plan Comments:         Anesthesia Quick Evaluation

## 2017-01-22 ENCOUNTER — Other Ambulatory Visit: Payer: Self-pay | Admitting: Pathology

## 2017-01-22 NOTE — Clinical Social Work Note (Signed)
MD consulted for lovenox and if it is cost effective for patient. Rn CM is aware and will investigate this and call MD back. Shela Leff MSW,LCSW [phone]

## 2017-01-22 NOTE — Progress Notes (Signed)
AVSS. Essentially pain free. Has ambulated around RN station x 2 this AM. Lungs: Clear. Cardio: RR. Extrem: Soft. ABD: Non-tender. BS+. Dressings: Dry. No flatus yet. Hungry. Plan: Advance diet. Will start lovenox today. With cancer dx, will look to have her take lovenox x 2 weeks at home.

## 2017-01-22 NOTE — Care Management (Signed)
Circle they will not have Lovenox in stock until Monday.  At patient's request RNCM contacted [facility].  Rx was called in and out of pocket cost $48.80.  Patient denies issues obtaining Medication.  Paged MD to notify.

## 2017-01-22 NOTE — Progress Notes (Signed)
Lovenox injection demonstrated to patient this am by RN. Explanation provided for rotation of sites. Patient would also like husband to be educated tomorrow am. Will report to oncoming shift.

## 2017-01-22 NOTE — Care Management Important Message (Signed)
Important Message  Patient Details  Name: [name] MRN: [id number] Date of Birth: 1946-03-19   Medicare Important Message Given:  Yes    Beverly Sessions, RN 01/22/2017, 3:03 PM

## 2017-01-23 MED ORDER — FAMOTIDINE IN NACL 20-0.9 MG/50ML-% IV SOLN
20.0000 mg | Freq: Two times a day (BID) | INTRAVENOUS | Status: DC
  Administered 2017-01-23: 20 mg via INTRAVENOUS
  Filled 2017-01-23 (×2): qty 50

## 2017-01-23 MED ORDER — METOCLOPRAMIDE HCL 5 MG/ML IJ SOLN
5.0000 mg | Freq: Four times a day (QID) | INTRAMUSCULAR | Status: DC
  Administered 2017-01-23 – 2017-01-24 (×5): 5 mg via INTRAVENOUS
  Filled 2017-01-23 (×5): qty 2

## 2017-01-23 MED ORDER — KETOROLAC TROMETHAMINE 30 MG/ML IJ SOLN
15.0000 mg | Freq: Three times a day (TID) | INTRAMUSCULAR | Status: DC
  Administered 2017-01-23 – 2017-01-24 (×3): 15 mg via INTRAVENOUS
  Filled 2017-01-23 (×3): qty 1

## 2017-01-23 MED ORDER — ALUM & MAG HYDROXIDE-SIMETH 200-200-20 MG/5ML PO SUSP
15.0000 mL | ORAL | Status: DC | PRN
  Administered 2017-01-23 – 2017-01-24 (×2): 15 mL via ORAL
  Filled 2017-01-23 (×2): qty 30

## 2017-01-23 MED ORDER — KCL-LACTATED RINGERS-D5W 20 MEQ/L IV SOLN
INTRAVENOUS | Status: DC
  Administered 2017-01-23 – 2017-01-24 (×2): via INTRAVENOUS
  Filled 2017-01-23 (×2): qty 1000

## 2017-01-23 NOTE — Progress Notes (Signed)
AVSS. No urine output recorded. IV phenergan not given. More pain after multiple up/downs from bed and ambulation yesterday. Nausea this AM w/ heartburn. No flatus but feels it is Administrator, arts. Scared that if she vomits she will burst her incision. Lungs: Clear. Cardio: RR. ABD: Mild distension, good BS. Soft. Dressings: Dry. Extrem: Soft. Plan: IV fluids today, reglan IV, toradol to minimize narcotic needs. IV phenergan for nausea.

## 2017-01-23 NOTE — Progress Notes (Signed)
Nurse called reporting significant heartburn.  Resolution of prior nausea. No pain. Will add H2 blocker IV tonight along w/ po antacids.

## 2017-01-24 MED ORDER — SODIUM CHLORIDE 0.9% FLUSH
3.0000 mL | Freq: Two times a day (BID) | INTRAVENOUS | Status: DC
  Administered 2017-01-24: 3 mL via INTRAVENOUS

## 2017-01-24 MED ORDER — ENOXAPARIN SODIUM 40 MG/0.4ML ~~LOC~~ SOLN: 40.0000 mg | SUBCUTANEOUS | 0 refills | Status: DC

## 2017-01-24 MED ORDER — OXYCODONE-ACETAMINOPHEN 5-325 MG PO TABS: 1.0000 | ORAL_TABLET | ORAL | 0 refills | Status: DC | PRN

## 2017-01-24 MED ORDER — FAMOTIDINE 20 MG PO TABS: 20.0000 mg | ORAL_TABLET | Freq: Every day | ORAL | 0 refills | Status: DC

## 2017-01-24 MED ORDER — SODIUM CHLORIDE 0.9 % IV SOLN: 250.0000 mL | INTRAVENOUS | Status: DC | PRN

## 2017-01-24 MED ORDER — METOCLOPRAMIDE HCL 5 MG PO TABS
5.0000 mg | ORAL_TABLET | Freq: Three times a day (TID) | ORAL | Status: DC
  Administered 2017-01-24 (×3): 5 mg via ORAL
  Filled 2017-01-24 (×3): qty 1

## 2017-01-24 MED ORDER — KETOROLAC TROMETHAMINE 10 MG PO TABS
10.0000 mg | ORAL_TABLET | Freq: Three times a day (TID) | ORAL | Status: DC
  Administered 2017-01-24 (×2): 10 mg via ORAL
  Filled 2017-01-24 (×3): qty 1

## 2017-01-24 MED ORDER — FAMOTIDINE 20 MG PO TABS: 20.0000 mg | ORAL_TABLET | Freq: Every day | ORAL | Status: DC

## 2017-01-24 MED ORDER — SODIUM CHLORIDE 0.9% FLUSH: 3.0000 mL | INTRAVENOUS | Status: DC | PRN

## 2017-01-24 MED ORDER — ONDANSETRON HCL 4 MG PO TABS: 4.0000 mg | ORAL_TABLET | ORAL | Status: DC | PRN

## 2017-01-24 NOTE — Progress Notes (Signed)
[name]  A and O x 4. VSS. Pt tolerating diet well. No complaints of pain or nausea. IV removed intact, prescriptions given. Pt voiced understanding of discharge instructions with no further questions. Pt discharged via wheelchair with Nurse Tech.    Allergies as of 01/24/2017      Reactions   Other    BAND-AIDS IF LEFT ON FOR EXTENDED PERIODS-REDNESS   Codeine    Nausea      Medication List    STOP taking these medications   docusate sodium 100 MG capsule Commonly known as:  COLACE   metroNIDAZOLE 500 MG tablet Commonly known as:  FLAGYL   neomycin 500 MG tablet Commonly known as:  MYCIFRADIN   polyethylene glycol powder powder Commonly known as:  GLYCOLAX/MIRALAX   scopolamine 1 MG/3DAYS Commonly known as:  TRANSDERM-SCOP (1.5 MG)     TAKE these medications   cetirizine 10 MG tablet Commonly known as:  ZYRTEC Take 10 mg by mouth daily.   enoxaparin 40 MG/0.4ML injection Commonly known as:  LOVENOX Inject 0.4 mLs (40 mg total) into the skin daily.   estradiol 0.5 MG tablet Commonly known as:  ESTRACE Take 1 tablet (0.5 mg total) by mouth daily.   famotidine 20 MG tablet Commonly known as:  PEPCID Take 1 tablet (20 mg total) by mouth at bedtime.   lamoTRIgine 200 MG tablet Commonly known as:  LAMICTAL Take 1 tablet (200 mg total) by mouth daily. What changed:  when to take this   levothyroxine 88 MCG tablet Commonly known as:  SYNTHROID, LEVOTHROID Take 88 mcg by mouth daily before breakfast.   LORazepam 1 MG tablet Commonly known as:  ATIVAN Take 1 mg by mouth at bedtime.   methylphenidate 5 MG tablet Commonly known as:  RITALIN Take 1 tablet (5 mg total) by mouth daily.   multivitamin with minerals tablet Take 1 tablet by mouth daily.   omeprazole 20 MG capsule Commonly known as:  PRILOSEC Take 20 mg by mouth daily before breakfast.   ondansetron 8 MG disintegrating tablet Commonly known as:  ZOFRAN-ODT Take 8 mg by mouth  every morning. AND PRN   ondansetron 4 MG disintegrating tablet Commonly known as:  ZOFRAN ODT Take 1 tablet (4 mg total) by mouth every 8 (eight) hours as needed for nausea or vomiting.   oxyCODONE-acetaminophen 5-325 MG tablet Commonly known as:  ROXICET Take 1 tablet by mouth every 4 (four) hours as needed for severe pain.   thyroid 30 MG tablet Commonly known as:  ARMOUR Take 30 mg by mouth daily before breakfast.   traZODone 50 MG tablet Commonly known as:  DESYREL Take 1 tablet (50 mg total) by mouth at bedtime.   TRINTELLIX 20 MG Tabs Generic drug:  vortioxetine HBr Take 20 mg by mouth daily. What changed:  when to take this            Discharge Care Instructions        Start     Ordered   01/25/17 0000  enoxaparin (LOVENOX) 40 MG/0.4ML injection  Every 24 hours    Comments:  RX previously sent to [facility].   01/24/17 1630   01/24/17 0000  famotidine (PEPCID) 20 MG tablet  Daily at bedtime     01/24/17 1630   01/24/17 0000  oxyCODONE-acetaminophen (ROXICET) 5-325 MG tablet  Every 4 hours PRN     01/24/17 1630   01/24/17 0000  Increase activity slowly     01/24/17  1630   01/24/17 0000  Diet - low sodium heart healthy     01/24/17 1630   01/24/17 0000  Discharge instructions    Comments:  Resume regular medications.  Tylenol/ Advil/ Aleve: As needed for soreness.  Percocet (Oxycodone): If needed for pain.  This medication may constipate.  Use Pepcid at bedtime to help control heartburn.  Heating pad to abdomen for comfort.  No driving until pain free.  No lifting over 10 pounds.  OK to shower at any time.    Diet as tolerated.  Avoid large meals for the next few days.   01/24/17 1630      Vitals:   01/24/17 0415 01/24/17 1359  BP: (!) 110/50 121/63  Pulse: 80 93  Resp: 16   Temp: 98.2 F (36.8 C) 98.4 F (36.9 C)  SpO2: 95% 99%    Francesco Sor

## 2017-01-24 NOTE — Progress Notes (Signed)
Notified MD that pt has been having multiple loose stools. Per MD no need to check for C Diff.

## 2017-01-24 NOTE — Progress Notes (Signed)
Pnt reports she did not void overnight but feels 98% better. Pnt also stated she got sleep and no longer has any "heartburn". Dr. Bary Castilla aware and at bedside.

## 2017-01-24 NOTE — Progress Notes (Signed)
Pnt c/o heartburn. Pnt reports heartburn not consistent but uncomfortable. Pnt denies any pain or nausea with this heartburn. Spoke with Dr. Bary Castilla. New orders for IV Pepcid per Dr. Bary Castilla and standing orders for Maalox placed by this RN. Pnt administered medication per orders. It has been some time since medication given(see MAR) pnt currently denies any heartburn, nausea or pain. Pnt reports she is able to sleep and feels better. No other issues or concerns at this time. Will continue to monitor and assess.

## 2017-01-24 NOTE — Final Progress Note (Signed)
Tolerated diet advance well. Some heartburn but no nausea or pain. Multiple loose stools today. Likely combination of Entereg, reglan, Mylanta and resolution of postoperative ileus. Exam: Minimal distension, non-tender.  Home today.

## 2017-01-24 NOTE — Progress Notes (Addendum)
AVSS. No urine output recorded for 24 hours.  Patient reports she has been up to void.  Nausea resolved. Pain much better w/ IV toradol. Heartburn was the big issue last night. Lungs: Clear. Cardio: RR ABD: Soft, good BS+. Wound: Dressings: Dry. Extrem: Soft. Ready for diet advance. Asked about going home.   Will change IV meds to po and see how she does. Plan: Advance diet, Toradol to po.  PM Pepcid to po. D/C IV fluids. Continue ambulation.  Has yet to receive incentive spirometer.

## 2017-01-25 ENCOUNTER — Telehealth: Payer: Self-pay | Admitting: *Deleted

## 2017-01-25 LAB — SURGICAL PATHOLOGY

## 2017-01-25 NOTE — Telephone Encounter (Signed)
error 

## 2017-02-01 ENCOUNTER — Other Ambulatory Visit: Payer: Self-pay

## 2017-02-01 NOTE — Patient Outreach (Signed)
[facility]) Care Management  02/01/2017  Trula Frede Zeitz 06-Nov-1945 [id number]       EMMI-GENERAL DISCHARGE RED ON EMMI ALERT Day # 4 Date: 01/29/17 Red Alert Reason: "Lost interest in things? Yes"   Outreach attempt # 1 to patient. Spoke with patient. Reviewed and addressed red alert. Patient states that she suffers from long term/chronic depression. She states that her depression has not been any worse lately. She states that since she is recovering from surgery and not feeling too well she has not been able to do as much as she normally does which prompted her response. She reports that she is coping and managing well. She has supportive family to assist her as needed. Patient states pain is controlled. She has pain meds in the home but so far has been able to manage pain with Tylenol only. She has f/u appt on Thursday. She voices no further RN CM needs or concerns at this time. She states that she does not like automated phone calls and requests that calls be stopped.         Plan: RN CM will notify [facility] administrative assistant of case status and patient request to have calls discontinued.    Enzo Montgomery, RN,BSN,CCM [place] Management Telephonic Care Management Coordinator Direct Phone: [phone] Toll Free: [phone] Fax: [phone]

## 2017-02-03 ENCOUNTER — Ambulatory Visit (INDEPENDENT_AMBULATORY_CARE_PROVIDER_SITE_OTHER): Payer: Medicare Other | Admitting: General Surgery

## 2017-02-03 VITALS — BP 132/74 | HR 70 | Resp 14 | Ht 62.0 in | Wt 169.0 lb

## 2017-02-03 DIAGNOSIS — C182 Malignant neoplasm of ascending colon: Secondary | ICD-10-CM

## 2017-02-03 NOTE — Patient Instructions (Addendum)
Patient to return in one month. The patient is aware to call back for any questions or concerns. 

## 2017-02-03 NOTE — Discharge Summary (Signed)
Physician Discharge Summary  Patient ID: [name] MRN: [id number] DOB/AGE: [birth year] 71 y.o.  Admit date: 01/21/2017 Discharge date: 02/03/2017  Admission Diagnoses: Right colon cancer  Discharge Diagnoses:  Active Problems:   Colon cancer [facility])   Discharged Condition: good  Hospital Course: Patient was admitted the day of surgery. Postoperative course was unremarkable. The patient ambulated early and often. Clear liquid diet advance to soft diet without incident. Extended prophylactic Lovenox prescribed.    Consults: None  Significant Diagnostic Studies: Pathology DIAGNOSIS:  A. RIGHT COLON; LAPAROSCOPIC-ASSISTED RIGHT COLECTOMY:  - INVASIVE ADENOCARCINOMA, MODERATELY DIFFERENTIATED, 4.6 CM.  - RESECTION MARGINS ARE NEGATIVE.  - POLYP WITH FEATURES OF A SESSILE SERRATED ADENOMA, 0.6 CM.  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  - NO TUMOR SEEN IN FOURTEEN LYMPH NODES (0/14).  MICROSATELLITE INSTABILITY IMMUNOHISTOCHEMISTRY  MISMATCH REPAIR PROTEINS:   MLH1: Intact nuclear expression  MSH2: Intact nuclear expression  MSH6: Intact nuclear expression  PMS2: Intact nuclear expression   Treatments: IV hydration  Discharge Exam: Blood pressure 121/63, pulse 93, temperature 98.4 F (36.9 C), temperature source Oral, resp. rate 16, height _0  (1.575 m), weight 175 lb (79.4 kg), SpO2 99 %. General appearance: alert and cooperative Neck: no adenopathy, no carotid bruit, no JVD, supple, symmetrical, trachea midline and thyroid not enlarged, symmetric, no tenderness/mass/nodules Resp: clear to auscultation bilaterally Cardio: regular rate and rhythm, S1, S2 normal, no murmur, click, rub or gallop Incision/Wound: Soft and nontender.  Extremities: No evidence of DVT.   Disposition: 01-Home or Self Care  Discharge Instructions    Diet - low sodium heart healthy    Complete by:  As directed    Discharge instructions    Complete by:  As  directed    Resume regular medications.  Tylenol/ Advil/ Aleve: As needed for soreness.  Percocet (Oxycodone): If needed for pain.  This medication may constipate.  Use Pepcid at bedtime to help control heartburn.  Heating pad to abdomen for comfort.  No driving until pain free.  No lifting over 10 pounds.  OK to shower at any time.    Diet as tolerated.  Avoid large meals for the next few days.   Increase activity slowly    Complete by:  As directed      Allergies as of 01/24/2017      Reactions   Other    BAND-AIDS IF LEFT ON FOR EXTENDED PERIODS-REDNESS   Codeine    Nausea      Medication List    STOP taking these medications   docusate sodium 100 MG capsule Commonly known as:  COLACE   metroNIDAZOLE 500 MG tablet Commonly known as:  FLAGYL   neomycin 500 MG tablet Commonly known as:  MYCIFRADIN   polyethylene glycol powder powder Commonly known as:  GLYCOLAX/MIRALAX   scopolamine 1 MG/3DAYS Commonly known as:  TRANSDERM-SCOP (1.5 MG)     TAKE these medications   cetirizine 10 MG tablet Commonly known as:  ZYRTEC Take 10 mg by mouth daily.   enoxaparin 40 MG/0.4ML injection Commonly known as:  LOVENOX Inject 0.4 mLs (40 mg total) into the skin daily.   estradiol 0.5 MG tablet Commonly known as:  ESTRACE Take 1 tablet (0.5 mg total) by mouth daily.   famotidine 20 MG tablet Commonly known as:  PEPCID Take 1 tablet (20 mg total) by mouth at bedtime.   lamoTRIgine 200 MG tablet Commonly known as:  LAMICTAL Take 1 tablet (200 mg total) by mouth  daily. What changed:  when to take this   levothyroxine 88 MCG tablet Commonly known as:  SYNTHROID, LEVOTHROID Take 88 mcg by mouth daily before breakfast.   LORazepam 1 MG tablet Commonly known as:  ATIVAN Take 1 mg by mouth at bedtime.   methylphenidate 5 MG tablet Commonly known as:  RITALIN Take 1 tablet (5 mg total) by mouth daily.   multivitamin with minerals tablet Take 1 tablet by  mouth daily.   omeprazole 20 MG capsule Commonly known as:  PRILOSEC Take 20 mg by mouth daily before breakfast.   ondansetron 8 MG disintegrating tablet Commonly known as:  ZOFRAN-ODT Take 8 mg by mouth every morning. AND PRN   ondansetron 4 MG disintegrating tablet Commonly known as:  ZOFRAN ODT Take 1 tablet (4 mg total) by mouth every 8 (eight) hours as needed for nausea or vomiting.   oxyCODONE-acetaminophen 5-325 MG tablet Commonly known as:  ROXICET Take 1 tablet by mouth every 4 (four) hours as needed for severe pain.   thyroid 30 MG tablet Commonly known as:  ARMOUR Take 30 mg by mouth daily before breakfast.   traZODone 50 MG tablet Commonly known as:  DESYREL Take 1 tablet (50 mg total) by mouth at bedtime.   TRINTELLIX 20 MG Tabs Generic drug:  vortioxetine HBr Take 20 mg by mouth daily. What changed:  when to take this      Follow-up Information    Orton Capell, Forest Gleason, MD Follow up.   Specialties:  General Surgery, Radiology Why:  Dr. Dwyane Luo office will call you tomorrow with a follow-up appointment Contact information: [address] [place] [address] [phone]           Signed: Robert Bellow 02/03/2017, 8:27 AM

## 2017-02-03 NOTE — Progress Notes (Signed)
Patient ID: [name], female   DOB: [birth year], 71 y.o.   MRN: [id number]  Chief Complaint  Patient presents with  . Routine Post Op    HPI [name] is a 71 y.o. female here today for her post op right colectomy done on 01/21/2017. Patient states she is doing well. Moving her bowels two times daily. Husband, [name] is present at visit. HPI  Past Medical History:  Diagnosis Date  . Anemia   . Arthritis   . Cancer ([place])   . Carcinoma ([place])   . GERD (gastroesophageal reflux disease)   . Hypothyroidism   . Major depressive disorder, recurrent episode, mild ([place])   . PONV (postoperative nausea and vomiting)   . Sleep apnea    USES CPAP    Past Surgical History:  Procedure Laterality Date  . ABDOMINAL HYSTERECTOMY  1980  . CHOLECYSTECTOMY    . COLONOSCOPY WITH PROPOFOL N/A 12/28/2016   Procedure: COLONOSCOPY WITH PROPOFOL;  Surgeon: Manya Silvas, MD;  Location: [facility] ENDOSCOPY;  Service: Endoscopy;  Laterality: N/A;  . ESOPHAGOGASTRODUODENOSCOPY (EGD) WITH PROPOFOL N/A 12/28/2016   Procedure: ESOPHAGOGASTRODUODENOSCOPY (EGD) WITH PROPOFOL;  Surgeon: Manya Silvas, MD;  Location: [facility] ENDOSCOPY;  Service: Endoscopy;  Laterality: N/A;  . LAPAROSCOPIC RIGHT COLECTOMY Right 01/21/2017   Procedure: LAPAROSCOPIC ASSISTED RIGHT COLECTOMY;  Surgeon: Robert Bellow, MD;  Location: [facility] ORS;  Service: General;  Laterality: Right;    Family History  Problem Relation Age of Onset  . Depression Brother   . Alcohol abuse Brother   . Suicidality Brother   . Stroke Mother   . Neuropathy Mother   . Colon polyps Father   . Bipolar disorder Neg Hx     Social History Social History  Substance Use Topics  . Smoking status: Never Smoker  . Smokeless tobacco: Never Used  . Alcohol use 4.2 oz/week    7 Shots of liquor per week     Comment: RARE     Allergies  Allergen Reactions  . Other     BAND-AIDS IF LEFT ON FOR EXTENDED PERIODS-REDNESS  . Codeine      Nausea    Current Outpatient Prescriptions  Medication Sig Dispense Refill  . cetirizine (ZYRTEC) 10 MG tablet Take 10 mg by mouth daily.    Marland Kitchen enoxaparin (LOVENOX) 40 MG/0.4ML injection Inject 0.4 mLs (40 mg total) into the skin daily. 14 Syringe 0  . estradiol (ESTRACE) 0.5 MG tablet Take 1 tablet (0.5 mg total) by mouth daily. 30 tablet 0  . famotidine (PEPCID) 20 MG tablet Take 1 tablet (20 mg total) by mouth at bedtime. 30 tablet 0  . lamoTRIgine (LAMICTAL) 200 MG tablet Take 1 tablet (200 mg total) by mouth daily. (Patient taking differently: Take 200 mg by mouth every morning. ) 30 tablet 2  . levothyroxine (SYNTHROID, LEVOTHROID) 88 MCG tablet Take 88 mcg by mouth daily before breakfast.   2  . LORazepam (ATIVAN) 1 MG tablet Take 1 mg by mouth at bedtime.     . methylphenidate (RITALIN) 5 MG tablet Take 1 tablet (5 mg total) by mouth daily. 30 tablet 0  . Multiple Vitamins-Minerals (MULTIVITAMIN WITH MINERALS) tablet Take 1 tablet by mouth daily.    Marland Kitchen omeprazole (PRILOSEC) 20 MG capsule Take 20 mg by mouth daily before breakfast.    . ondansetron (ZOFRAN ODT) 4 MG disintegrating tablet Take 1 tablet (4 mg total) by mouth every 8 (eight) hours as needed for nausea or vomiting. [place]  tablet 0  . ondansetron (ZOFRAN-ODT) 8 MG disintegrating tablet Take 8 mg by mouth every morning. AND PRN    . thyroid (ARMOUR) 30 MG tablet Take 30 mg by mouth daily before breakfast.    . traZODone (DESYREL) 50 MG tablet Take 1 tablet (50 mg total) by mouth at bedtime. 30 tablet 2  . TRINTELLIX 20 MG TABS Take 20 mg by mouth daily. (Patient taking differently: Take 20 mg by mouth every morning. ) 30 tablet 2   No current facility-administered medications for this visit.     Review of Systems Review of Systems  Constitutional: Negative.   Respiratory: Negative.   Cardiovascular: Negative.   Gastrointestinal: Positive for nausea.    Blood pressure 132/74, pulse 70, resp. rate 14, height _0  (1.575  m), weight 169 lb (76.7 kg).  Physical Exam Physical Exam  Constitutional: She is oriented to person, place, and time. She appears well-developed and well-nourished.  Cardiovascular: Normal rate, regular rhythm and normal heart sounds.   Pulmonary/Chest: Effort normal and breath sounds normal.  Abdominal:    Neurological: She is alert and oriented to person, place, and time.  Skin: Skin is warm and dry.    Data Reviewed 01/21/2017 pathology: DIAGNOSIS:  A. RIGHT COLON; LAPAROSCOPIC-ASSISTED RIGHT COLECTOMY:  - INVASIVE ADENOCARCINOMA, MODERATELY DIFFERENTIATED, 4.6 CM.  - RESECTION MARGINS ARE NEGATIVE.  - POLYP WITH FEATURES OF A SESSILE SERRATED ADENOMA, 0.6 CM.  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  - NO TUMOR SEEN IN FOURTEEN LYMPH NODES (0/14).   MICROSATELLITE INSTABILITY IMMUNOHISTOCHEMISTRY  MISMATCH REPAIR PROTEINS:   MLH1: Intact nuclear expression  MSH2: Intact nuclear expression  MSH6: Intact nuclear expression  PMS2: Intact nuclear expression   Interpretation: No loss of nuclear expression of mismatch repair  proteins: Low probability of MSI-H.   Assessment    Doing well status post laparoscopically assisted right hemicolectomy.  Mild morning nausea without clear etiology.    Plan    Patient will continue to use or oral a.m. anti-medics for comfort.  Increase her activity as tolerated.  Reviewed indications for medical oncology assessment. She was amenable to meet with medical oncology.     Patient to return  In one month.  The patient is aware to call back for any questions or concerns.   HPI, Physical Exam, Assessment and Plan have been scribed under the direction and in the presence of Hervey Ard, MD.  Gaspar Cola, CMA  I have completed the exam and reviewed the above documentation for accuracy and completeness.  I agree with the above.  Haematologist has been used and any errors in dictation or  transcription are unintentional.  Hervey Ard, M.D., F.A.C.S.  Robert Bellow 02/03/2017, 9:24 PM

## 2017-02-05 ENCOUNTER — Inpatient Hospital Stay: Payer: Medicare Other | Attending: Oncology | Admitting: Oncology

## 2017-02-05 ENCOUNTER — Encounter: Payer: Self-pay | Admitting: Oncology

## 2017-02-05 VITALS — BP 106/63 | HR 81 | Temp 97.7°F | Resp 16 | Ht 62.75 in | Wt 168.2 lb

## 2017-02-05 DIAGNOSIS — D509 Iron deficiency anemia, unspecified: Secondary | ICD-10-CM | POA: Diagnosis not present

## 2017-02-05 DIAGNOSIS — Z79899 Other long term (current) drug therapy: Secondary | ICD-10-CM | POA: Insufficient documentation

## 2017-02-05 DIAGNOSIS — G4730 Sleep apnea, unspecified: Secondary | ICD-10-CM | POA: Insufficient documentation

## 2017-02-05 DIAGNOSIS — K5730 Diverticulosis of large intestine without perforation or abscess without bleeding: Secondary | ICD-10-CM | POA: Insufficient documentation

## 2017-02-05 DIAGNOSIS — C182 Malignant neoplasm of ascending colon: Secondary | ICD-10-CM | POA: Diagnosis not present

## 2017-02-05 DIAGNOSIS — K219 Gastro-esophageal reflux disease without esophagitis: Secondary | ICD-10-CM | POA: Insufficient documentation

## 2017-02-05 DIAGNOSIS — E039 Hypothyroidism, unspecified: Secondary | ICD-10-CM | POA: Diagnosis not present

## 2017-02-05 DIAGNOSIS — D500 Iron deficiency anemia secondary to blood loss (chronic): Secondary | ICD-10-CM | POA: Diagnosis not present

## 2017-02-05 NOTE — Progress Notes (Signed)
Patinet here today as a new patient with colon cancer

## 2017-02-06 ENCOUNTER — Encounter: Payer: Self-pay | Admitting: Oncology

## 2017-02-06 NOTE — Progress Notes (Addendum)
Hematology/Oncology Consult note [facility] Telephone:([phone] [phone]  CONSULT NOTE Patient Care Team: Lavera Guise, MD as PCP - General (Internal Medicine) Manya Silvas, MD (Gastroenterology) Bary Castilla Forest Gleason, MD (General Surgery) Clent Jacks, RN as Registered Nurse  REFERRING PROVIDER: Dr.Byrnett Dellis Filbert CHIEF COMPLAINTS/PURPOSE OF CONSULTATION:  Evaluation and management of colon cancer  HISTORY OF PRESENTING ILLNESS:  [name] is a @ 71 y.o.  female with PMH listed below who was referred to me for evaluation and management of colon cancer.  a colonoscopy done on 12/25/2016 by Dr. Vira Agar. Moves her bowels daily. Patient states she is nausea, states it started at the end of June .She states she was having pain in her left upper quadrant pain was seen in the ER on 12/24/2016 Upper and lower endoscopies dated 12/28/2016 reviewed which showed Cecal mass, biopsy-proven adenocarcinoma. Patient underwent laparoscopically assisted right hemicolectomy with ileotransverse colostomy. Pathology revealed pT3N0 adenocarcinoma, with perineural invasion and lymphvascular invasion  Husband, [name] is present at visit. Patient also has experienced nausea since June this year and she was not able to tolerate perioperative antibiotics. Currently does not have nausea.    ROS  Review of Systems  Constitutional: Negative for fever, night sweats,unintentional weight loss, change in appetite. HENT: Negative for ear pain, hearing loss, nasal bleeding Eyes: Negative for eye pain, double vision   Respiratory: Negative for wheezing, shortness of breath, cough Cardiovascular: Negative for chest pain, palpitation.   Gastrointestinal: Negative abdominal pain, diarrhea, nausea vomiting Endocrine: Negative  Genitourinary: Negative for dysuria, hematuria, frequency Skin: Negative for rash, iching, bruising Neurological: Negative for headache,  dizziness, seizure Hematological: Negative for easy bruising/bleeding, lymph node enlargement Psychiatric/Behavioral: Negative for depression, anxiety, suicidally  MEDICAL HISTORY:  Past Medical History:  Diagnosis Date  . Anemia   . Carcinoma ([place])   . Colon cancer ([place])   . GERD (gastroesophageal reflux disease)   . Hemorrhoids   . Hypothyroidism   . Major depressive disorder, recurrent episode, mild ([place])   . PONV (postoperative nausea and vomiting)   . Sleep apnea    USES CPAP    SURGICAL HISTORY: Past Surgical History:  Procedure Laterality Date  . ABDOMINAL HYSTERECTOMY  1980  . CHOLECYSTECTOMY    . COLONOSCOPY WITH PROPOFOL N/A 12/28/2016   Procedure: COLONOSCOPY WITH PROPOFOL;  Surgeon: Manya Silvas, MD;  Location: [facility] ENDOSCOPY;  Service: Endoscopy;  Laterality: N/A;  . ESOPHAGOGASTRODUODENOSCOPY (EGD) WITH PROPOFOL N/A 12/28/2016   Procedure: ESOPHAGOGASTRODUODENOSCOPY (EGD) WITH PROPOFOL;  Surgeon: Manya Silvas, MD;  Location: [facility] ENDOSCOPY;  Service: Endoscopy;  Laterality: N/A;  . LAPAROSCOPIC RIGHT COLECTOMY Right 01/21/2017   Procedure: LAPAROSCOPIC ASSISTED RIGHT COLECTOMY;  Surgeon: Robert Bellow, MD;  Location: [facility] ORS;  Service: General;  Laterality: Right;    SOCIAL HISTORY: Social History   Social History  . Marital status: Married    Spouse name: N/A  . Number of children: N/A  . Years of education: N/A   Occupational History  . Not on file.   Social History Main Topics  . Smoking status: Never Smoker  . Smokeless tobacco: Never Used  . Alcohol use 0.6 oz/week    1 Shots of liquor per week     Comment: RARE   . Drug use: No  . Sexual activity: Yes    Partners: Male    Birth control/ protection: None   Other Topics Concern  . Not on file   Social History Narrative  . No  narrative on file    FAMILY HISTORY: Family History  Problem Relation Age of Onset  . Depression Brother   . Alcohol abuse Brother   . Suicidality  Brother   . Stroke Mother   . Clotting disorder Brother   . Colon polyps Father   . Melanoma Sister   . Bipolar disorder Neg Hx     ALLERGIES:  is allergic to other and codeine.  MEDICATIONS:  Current Outpatient Prescriptions  Medication Sig Dispense Refill  . cetirizine (ZYRTEC) 10 MG tablet Take 10 mg by mouth daily.    Marland Kitchen enoxaparin (LOVENOX) 40 MG/0.4ML injection Inject 0.4 mLs (40 mg total) into the skin daily. 14 Syringe 0  . estradiol (ESTRACE) 0.5 MG tablet Take 1 tablet (0.5 mg total) by mouth daily. 30 tablet 0  . famotidine (PEPCID) 20 MG tablet Take 1 tablet (20 mg total) by mouth at bedtime. 30 tablet 0  . lamoTRIgine (LAMICTAL) 200 MG tablet Take 1 tablet (200 mg total) by mouth daily. (Patient taking differently: Take 200 mg by mouth every morning. ) 30 tablet 2  . levothyroxine (SYNTHROID, LEVOTHROID) 88 MCG tablet Take 88 mcg by mouth daily before breakfast.   2  . LORazepam (ATIVAN) 1 MG tablet Take 1 mg by mouth at bedtime.     . methylphenidate (RITALIN) 5 MG tablet Take 1 tablet (5 mg total) by mouth daily. 30 tablet 0  . Multiple Vitamins-Minerals (MULTIVITAMIN WITH MINERALS) tablet Take 1 tablet by mouth daily.    Marland Kitchen omeprazole (PRILOSEC) 20 MG capsule Take 20 mg by mouth daily before breakfast.    . ondansetron (ZOFRAN-ODT) 8 MG disintegrating tablet Take 8 mg by mouth every morning. AND PRN    . thyroid (ARMOUR) 30 MG tablet Take 30 mg by mouth daily before breakfast.    . traZODone (DESYREL) 50 MG tablet Take 1 tablet (50 mg total) by mouth at bedtime. 30 tablet 2  . TRINTELLIX 20 MG TABS Take 20 mg by mouth daily. (Patient taking differently: Take 20 mg by mouth every morning. ) 30 tablet 2   No current facility-administered medications for this visit.       Marland Kitchen  PHYSICAL EXAMINATION: ECOG PERFORMANCE STATUS: 0 - Asymptomatic Vitals:   02/05/17 1520  BP: 106/63  Pulse: 81  Resp: 16  Temp: 97.7 F (36.5 C)   Filed Weights   02/05/17 1520  Weight:  168 lb 3 oz (76.3 kg)   Physical Exam  GENERAL: No distress, well nourished.  SKIN:  No rashes or significant lesions  HEAD: Normocephalic, No masses, lesions, tenderness or abnormalities  EYES: Conjunctiva are pink, non icteric ENT: External ears normal ,lips , buccal mucosa, and tongue normal and mucous membranes are moist  LYMPH: No palpable cervical and axillary lymphadenopathy  LUNGS: Clear to auscultation, no crackles or wheezes HEART: Regular rate & rhythm, no murmurs, no gallops, S1 normal and S2 normal  ABDOMEN: Abdomen soft, non-tender, normal bowel sounds, I did not appreciate any  masses or organomegaly  MUSCULOSKELETAL: No CVA tenderness and no tenderness on percussion of the back or rib cage.  EXTREMITIES: No edema, no skin discoloration or tenderness NEURO: Alert & oriented, no focal motor/sensory deficits.  LABORATORY DATA:  I have reviewed the data as listed Lab Results  Component Value Date   WBC 15.1 (H) 01/21/2017   HGB 9.6 (L) 01/21/2017   HCT 29.2 (L) 01/21/2017   MCV 84.0 01/21/2017   PLT 354 01/21/2017    Recent  Labs  12/24/16 1421 01/17/17 2119 01/21/17 1149  NA 140 140  --   K 4.1 4.0  --   CL 107 107  --   CO2 27 25  --   GLUCOSE 104* 111*  --   BUN 12 13  --   CREATININE 0.94 1.00 0.92  CALCIUM 9.1 9.2  --   GFRNONAA >60 56* >60  GFRAA >60 >60 >60  PROT 6.6 7.1  --   ALBUMIN 3.8 3.9  --   AST 16 22  --   ALT 17 21  --   ALKPHOS 67 73  --   BILITOT 0.2* 0.5  --     RADIOGRAPHIC STUDIES: I have personally reviewed the radiological images as listed and agreed with the findings in the report. CT abdomen pelvis with contrast.  IMPRESSION: 1. Negative for a bowel obstruction. 2. Irregular wall thickening of the cecum and ileocecal region presumably corresponding to the patient's history of colon cancer 3. Sigmoid colon diverticular disease without acute inflammation 4. Subcentimeter hypodense lesion in the left hepatic lobe too small to  further characterize  Pathology Surgical Pathology  DIAGNOSIS:  A. RIGHT COLON; LAPAROSCOPIC-ASSISTED RIGHT COLECTOMY:  - INVASIVE ADENOCARCINOMA, MODERATELY DIFFERENTIATED, 4.6 CM.  - RESECTION MARGINS ARE NEGATIVE.  - POLYP WITH FEATURES OF A SESSILE SERRATED ADENOMA, 0.6 CM.  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  - NO TUMOR SEEN IN FOURTEEN LYMPH NODES (0/14).  - SEE SUMMARY BELOW.   COLON AND RECTUM:  Procedure: Right colectomy  Tumor Site: Ileocecal valve  Tumor Size: Greatest dimension: 4.6 cm  Macroscopic Tumor Perforation: Not specified  Histologic Type: Adenocarcinoma  Histologic Grade: G2; Moderately differentiated  Tumor Extension: Tumor invades through muscularis propria into  pericolorectal tissue  Margins: All margins uninvolved by invasive carcinoma, high-grade  dysplasia, intramucosal adenocarcinoma, and adenoma  Treatment Effect: No known presurgical therapy  Lymphovascular Invasion: Present  Perineural Invasion: Present  Tumor Deposits: Not identified  Regional Lymph Nodes: # examined: 14,  # involved: 0  Pathologic Stage Classification (pTNM, AJCC 8th Edition): pT3 pN0  TNM Descriptors: Not applicable    ASSESSMENT & PLAN:  1. Cancer of right colon [facility])   Cancer Stage: pT3N0cM0, stage colon cancer.  Discussed with patient that due to the high risk factors including perineural invasion and lymphovascular invasion I recommend adjuvant chemotherapy with either Xeloda or 5-FU. Given that she has had nausea and vomiting problem perioperatively, also patient's concern of taking multiple oral chemotherapy medication, 5-FU infusion every 2 weeks for 12 treatments appear to be a better options for her.  I printed a copy of patient's pathology reports and reviewed with her line by line. I explained to her that most Stage II colon cancer with good risk factors do not need chemotherapy, however she has high risk factors and that's why  I am offering chemotherapy. I also printed the NCCN guideline Principle of Stage II colon cancer adjuvant chemotherapy and review with patient. She is aware that adjuvant chemotherapy is recommended, however  observation is still a reasonable option listed on the guideline for this group of patient.   Check CT chest with contrast for baseline.  Patient most likely leaning toward chemotherapy but would like to think it over. I refer her to attend chemotherapy class. She will call Dr.Byrnett's office for port placement.  Tentatively starting chemotherapy in 3 weeks. (begining of November) She will call my office and let me know her decision.  .  All  questions were answered. The patient knows to call the clinic with any problems questions or concerns.  Return of visit: Day 1 of  5-FU treatment.  Total face to face encounter time for this patient visit was 60 min. >50% of the time was  spent in counseling and coordination of care.  Thank you for this kind referral and the opportunity to participate in the care of this patient. A copy of today's note is routed to referring provider    Earlie Server, MD, PhD Hematology Oncology [facility] Pager- [phone] 02/06/2017

## 2017-02-06 NOTE — Progress Notes (Signed)
START ON PATHWAY REGIMEN - Colorectal     A cycle is every 14 days:     Leucovorin      5-Fluorouracil      5-Fluorouracil   **Always confirm dose/schedule in your pharmacy ordering system**    Patient Characteristics: Colon Adjuvant, Stage IIA, Clinical High Risk Current evidence of distant metastases<= No AJCC T Category: T3 AJCC N Category: N0 AJCC M Category: M0 AJCC 8 Stage Grouping: IIA Intent of Therapy: Curative Intent, Discussed with Patient

## 2017-02-08 NOTE — Patient Instructions (Signed)
Fluorouracil, 5-FU injection What is this medicine? FLUOROURACIL, 5-FU (flure oh YOOR a sil) is a chemotherapy drug. It slows the growth of cancer cells. This medicine is used to treat many types of cancer like breast cancer, colon or rectal cancer, pancreatic cancer, and stomach cancer. This medicine may be used for other purposes; ask your health care provider or pharmacist if you have questions. COMMON BRAND NAME(S): Adrucil What should I tell my health care provider before I take this medicine? They need to know if you have any of these conditions: -blood disorders -dihydropyrimidine dehydrogenase (DPD) deficiency -infection (especially a virus infection such as chickenpox, cold sores, or herpes) -kidney disease -liver disease -malnourished, poor nutrition -recent or ongoing radiation therapy -an unusual or allergic reaction to fluorouracil, other chemotherapy, other medicines, foods, dyes, or preservatives -pregnant or trying to get pregnant -breast-feeding How should I use this medicine? This drug is given as an infusion or injection into a vein. It is administered in a hospital or clinic by a specially trained health care professional. Talk to your pediatrician regarding the use of this medicine in children. Special care may be needed. Overdosage: If you think you have taken too much of this medicine contact a poison control center or emergency room at once. NOTE: This medicine is only for you. Do not share this medicine with others. What if I miss a dose? It is important not to miss your dose. Call your doctor or health care professional if you are unable to keep an appointment. What may interact with this medicine? -allopurinol -cimetidine -dapsone -digoxin -hydroxyurea -leucovorin -levamisole -medicines for seizures like ethotoin, fosphenytoin, phenytoin -medicines to increase blood counts like filgrastim, pegfilgrastim, sargramostim -medicines that treat or prevent blood  clots like warfarin, enoxaparin, and dalteparin -methotrexate -metronidazole -pyrimethamine -some other chemotherapy drugs like busulfan, cisplatin, estramustine, vinblastine -trimethoprim -trimetrexate -vaccines Talk to your doctor or health care professional before taking any of these medicines: -acetaminophen -aspirin -ibuprofen -ketoprofen -naproxen This list may not describe all possible interactions. Give your health care provider a list of all the medicines, herbs, non-prescription drugs, or dietary supplements you use. Also tell them if you smoke, drink alcohol, or use illegal drugs. Some items may interact with your medicine. What should I watch for while using this medicine? Visit your doctor for checks on your progress. This drug may make you feel generally unwell. This is not uncommon, as chemotherapy can affect healthy cells as well as cancer cells. Report any side effects. Continue your course of treatment even though you feel ill unless your doctor tells you to stop. In some cases, you may be given additional medicines to help with side effects. Follow all directions for their use. Call your doctor or health care professional for advice if you get a fever, chills or sore throat, or other symptoms of a cold or flu. Do not treat yourself. This drug decreases your body's ability to fight infections. Try to avoid being around people who are sick. This medicine may increase your risk to bruise or bleed. Call your doctor or health care professional if you notice any unusual bleeding. Be careful brushing and flossing your teeth or using a toothpick because you may get an infection or bleed more easily. If you have any dental work done, tell your dentist you are receiving this medicine. Avoid taking products that contain aspirin, acetaminophen, ibuprofen, naproxen, or ketoprofen unless instructed by your doctor. These medicines may hide a fever. Do not become pregnant while taking this  medicine. Women should inform their doctor if they wish to become pregnant or think they might be pregnant. There is a potential for serious side effects to an unborn child. Talk to your health care professional or pharmacist for more information. Do not breast-feed an infant while taking this medicine. Men should inform their doctor if they wish to father a child. This medicine may lower sperm counts. Do not treat diarrhea with over the counter products. Contact your doctor if you have diarrhea that lasts more than 2 days or if it is severe and watery. This medicine can make you more sensitive to the sun. Keep out of the sun. If you cannot avoid being in the sun, wear protective clothing and use sunscreen. Do not use sun lamps or tanning beds/booths. What side effects may I notice from receiving this medicine? Side effects that you should report to your doctor or health care professional as soon as possible: -allergic reactions like skin rash, itching or hives, swelling of the face, lips, or tongue -low blood counts - this medicine may decrease the number of white blood cells, red blood cells and platelets. You may be at increased risk for infections and bleeding. -signs of infection - fever or chills, cough, sore throat, pain or difficulty passing urine -signs of decreased platelets or bleeding - bruising, pinpoint red spots on the skin, black, tarry stools, blood in the urine -signs of decreased red blood cells - unusually weak or tired, fainting spells, lightheadedness -breathing problems -changes in vision -chest pain -mouth sores -nausea and vomiting -pain, swelling, redness at site where injected -pain, tingling, numbness in the hands or feet -redness, swelling, or sores on hands or feet -stomach pain -unusual bleeding Side effects that usually do not require medical attention (report to your doctor or health care professional if they continue or are bothersome): -changes in finger or  toe nails -diarrhea -dry or itchy skin -hair loss -headache -loss of appetite -sensitivity of eyes to the light -stomach upset -unusually teary eyes This list may not describe all possible side effects. Call your doctor for medical advice about side effects. You may report side effects to FDA at 1-800-FDA-1088. Where should I keep my medicine? This drug is given in a hospital or clinic and will not be stored at home. NOTE: This sheet is a summary. It may not cover all possible information. If you have questions about this medicine, talk to your doctor, pharmacist, or health care provider.  2018 Elsevier/Gold Standard (2007-08-24 13:53:16) Leucovorin injection What is this medicine? LEUCOVORIN (loo koe VOR in) is used to prevent or treat the harmful effects of some medicines. This medicine is used to treat anemia caused by a low amount of folic acid in the body. It is also used with 5-fluorouracil (5-FU) to treat colon cancer. This medicine may be used for other purposes; ask your health care provider or pharmacist if you have questions. What should I tell my health care provider before I take this medicine? They need to know if you have any of these conditions: -anemia from low levels of vitamin B-12 in the blood -an unusual or allergic reaction to leucovorin, folic acid, other medicines, foods, dyes, or preservatives -pregnant or trying to get pregnant -breast-feeding How should I use this medicine? This medicine is for injection into a muscle or into a vein. It is given by a health care professional in a hospital or clinic setting. Talk to your pediatrician regarding the use of this medicine in   I tell my health care provider before I take this medicine?  They need to know if you have any of these conditions:  -anemia from low levels of vitamin B-12 in the blood  -an unusual or allergic reaction to leucovorin, folic acid, other medicines, foods, dyes, or preservatives  -pregnant or trying to get pregnant  -breast-feeding  How should I use this medicine?  This medicine is for injection into a muscle or into a vein. It is given by a health care professional in a hospital or clinic setting.  Talk to your pediatrician regarding the use of this medicine in children. Special care may be needed.  Overdosage: If you think you have taken too much of this medicine contact a poison control center or emergency room at once.  NOTE: This medicine is only for you. Do not share this medicine with others.  What if I miss a dose?  This does not apply.  What may  interact with this medicine?  -capecitabine  -fluorouracil  -phenobarbital  -phenytoin  -primidone  -trimethoprim-sulfamethoxazole  This list may not describe all possible interactions. Give your health care provider a list of all the medicines, herbs, non-prescription drugs, or dietary supplements you use. Also tell them if you smoke, drink alcohol, or use illegal drugs. Some items may interact with your medicine.  What should I watch for while using this medicine?  Your condition will be monitored carefully while you are receiving this medicine.  This medicine may increase the side effects of 5-fluorouracil, 5-FU. Tell your doctor or health care professional if you have diarrhea or mouth sores that do not get better or that get worse.  What side effects may I notice from receiving this medicine?  Side effects that you should report to your doctor or health care professional as soon as possible:  -allergic reactions like skin rash, itching or hives, swelling of the face, lips, or tongue  -breathing problems  -fever, infection  -mouth sores  -unusual bleeding or bruising  -unusually weak or tired  Side effects that usually do not require medical attention (report to your doctor or health care professional if they continue or are bothersome):  -constipation or diarrhea  -loss of appetite  -nausea, vomiting  This list may not describe all possible side effects. Call your doctor for medical advice about side effects. You may report side effects to FDA at 1-800-FDA-1088.  Where should I keep my medicine?  This drug is given in a hospital or clinic and will not be stored at home.  NOTE: This sheet is a summary. It may not cover all possible information. If you have questions about this medicine, talk to your doctor, pharmacist, or health care provider.   2018 Elsevier/Gold Standard (2007-10-25 16:50:29)

## 2017-02-09 ENCOUNTER — Inpatient Hospital Stay: Payer: Medicare Other

## 2017-02-10 ENCOUNTER — Telehealth: Payer: Self-pay | Admitting: *Deleted

## 2017-02-10 NOTE — Telephone Encounter (Signed)
Patient reports that she is NOT going to take chemotherapy and is asking if she needs to keep the appointment for lab doctor on 11/5. Please advise

## 2017-02-10 NOTE — Telephone Encounter (Signed)
Patient informed and states she will keep thise appts

## 2017-02-10 NOTE — Telephone Encounter (Signed)
Please let her know that she still needs the CT chest and follow up with me on 11/5 thansk

## 2017-02-10 NOTE — Telephone Encounter (Signed)
Need CT and she can see me on 11/5

## 2017-02-20 ENCOUNTER — Other Ambulatory Visit: Payer: Self-pay | Admitting: General Surgery

## 2017-02-22 ENCOUNTER — Telehealth: Payer: Self-pay

## 2017-02-22 NOTE — Telephone Encounter (Signed)
Patient called and reports that she has been having some clear sticky drainage at the base on her incision near her belly button. She had a colectomy done on 01/21/17. She reports the area is very mildly tender only when she presses. No redness or heat, and no fevers or chills for her. She reports that it will drain a little and then dry to a sticky crust. Patient will come in early for her post op, rescheduled for this Thursday. Instructed to just keep the area clean, wash with warm water and soap and pat dry, and may put a bandage over the area to keep the fluid off of her clothes. Instructed to call back if anything changes.

## 2017-02-25 ENCOUNTER — Ambulatory Visit (INDEPENDENT_AMBULATORY_CARE_PROVIDER_SITE_OTHER): Payer: Medicare Other | Admitting: General Surgery

## 2017-02-25 ENCOUNTER — Encounter: Payer: Self-pay | Admitting: General Surgery

## 2017-02-25 VITALS — BP 124/64 | HR 85 | Resp 12 | Ht 62.0 in | Wt 168.0 lb

## 2017-02-25 DIAGNOSIS — C182 Malignant neoplasm of ascending colon: Secondary | ICD-10-CM

## 2017-02-25 NOTE — Progress Notes (Signed)
Patient ID: [name], female   DOB: February 06, 1946, 71 y.o.   MRN: [id number]  Chief Complaint  Patient presents with  . Routine Post Op    HPI [name] is a 71 y.o. female here today for her one month follow up right colectomy done on 01/21/2017. Patient states she noticed some drainage at the belly button about 2 weeks ago. Denies any fever or chills. She states the drainage is clear/pink and sticky, the area is sore. Bowels move regular and daily. Energy level improving. The patient was evaluated by Dr. Tasia Catchings for medical oncology. Adjuvant chemotherapy was recommended based on pT3,N0 status. Patient has declined adjuvant chemotherapy at this time. HPI  Past Medical History:  Diagnosis Date  . Anemia   . Carcinoma ([place])   . Colon cancer ([place])   . GERD (gastroesophageal reflux disease)   . Hemorrhoids   . Hypothyroidism   . Major depressive disorder, recurrent episode, mild ([place])   . PONV (postoperative nausea and vomiting)   . Sleep apnea    USES CPAP    Past Surgical History:  Procedure Laterality Date  . ABDOMINAL HYSTERECTOMY  1980  . CHOLECYSTECTOMY    . COLONOSCOPY WITH PROPOFOL N/A 12/28/2016   Procedure: COLONOSCOPY WITH PROPOFOL;  Surgeon: Manya Silvas, MD;  Location: [facility] ENDOSCOPY;  Service: Endoscopy;  Laterality: N/A;  . ESOPHAGOGASTRODUODENOSCOPY (EGD) WITH PROPOFOL N/A 12/28/2016   Procedure: ESOPHAGOGASTRODUODENOSCOPY (EGD) WITH PROPOFOL;  Surgeon: Manya Silvas, MD;  Location: [facility] ENDOSCOPY;  Service: Endoscopy;  Laterality: N/A;  . LAPAROSCOPIC RIGHT COLECTOMY Right 01/21/2017   Procedure: LAPAROSCOPIC ASSISTED RIGHT COLECTOMY;  Surgeon: Robert Bellow, MD;  Location: [facility] ORS;  Service: General;  Laterality: Right;    Family History  Problem Relation Age of Onset  . Depression Brother   . Alcohol abuse Brother   . Suicidality Brother   . Stroke Mother   . Clotting disorder Brother   . Colon polyps Father   . Melanoma  Sister   . Bipolar disorder Neg Hx     Social History Social History  Substance Use Topics  . Smoking status: Never Smoker  . Smokeless tobacco: Never Used  . Alcohol use 0.6 oz/week    1 Shots of liquor per week     Comment: RARE     Allergies  Allergen Reactions  . Other     BAND-AIDS IF LEFT ON FOR EXTENDED PERIODS-REDNESS  . Codeine     Nausea    Current Outpatient Prescriptions  Medication Sig Dispense Refill  . cetirizine (ZYRTEC) 10 MG tablet Take 10 mg by mouth daily.    Marland Kitchen enoxaparin (LOVENOX) 40 MG/0.4ML injection Inject 0.4 mLs (40 mg total) into the skin daily. 14 Syringe 0  . estradiol (ESTRACE) 0.5 MG tablet Take 1 tablet (0.5 mg total) by mouth daily. 30 tablet 0  . famotidine (PEPCID) 20 MG tablet TAKE ONE TABLET BY MOUTH EVERY NIGHT AT BEDTIME 30 tablet 0  . lamoTRIgine (LAMICTAL) 200 MG tablet Take 1 tablet (200 mg total) by mouth daily. (Patient taking differently: Take 200 mg by mouth every morning. ) 30 tablet 2  . levothyroxine (SYNTHROID, LEVOTHROID) 88 MCG tablet Take 88 mcg by mouth daily before breakfast.   2  . LORazepam (ATIVAN) 1 MG tablet Take 1 mg by mouth at bedtime.     . methylphenidate (RITALIN) 5 MG tablet Take 1 tablet (5 mg total) by mouth daily. 30 tablet 0  . Multiple Vitamins-Minerals (MULTIVITAMIN WITH  MINERALS) tablet Take 1 tablet by mouth daily.    Marland Kitchen omeprazole (PRILOSEC) 20 MG capsule Take 20 mg by mouth daily before breakfast.    . ondansetron (ZOFRAN-ODT) 8 MG disintegrating tablet Take 8 mg by mouth every morning. AND PRN    . thyroid (ARMOUR) 30 MG tablet Take 30 mg by mouth daily before breakfast.    . traZODone (DESYREL) 50 MG tablet Take 1 tablet (50 mg total) by mouth at bedtime. 30 tablet 2  . TRINTELLIX 20 MG TABS Take 20 mg by mouth daily. (Patient taking differently: Take 20 mg by mouth every morning. ) 30 tablet 2   No current facility-administered medications for this visit.     Review of Systems Review of Systems   Constitutional: Negative.   Respiratory: Negative.   Cardiovascular: Negative.     Blood pressure 124/64, pulse 85, resp. rate 12, height '5\' 2"'  (1.575 m), weight 168 lb (76.2 kg), SpO2 100 %.  Physical Exam Physical Exam  Constitutional: She is oriented to person, place, and time. She appears well-developed and well-nourished.  HENT:  Mouth/Throat: Oropharynx is clear and moist.  Eyes: Conjunctivae are normal. No scleral icterus.  Neck: Neck supple.  Cardiovascular: Normal rate, regular rhythm and normal heart sounds.   Pulmonary/Chest: Effort normal and breath sounds normal.  Abdominal:    Silver nitrate application  Lymphadenopathy:    She has no cervical adenopathy.  Neurological: She is alert and oriented to person, place, and time.  Skin: Skin is warm and dry.  Psychiatric: Her behavior is normal.    Data Reviewed ADDENDUM:   MICROSATELLITE INSTABILITY IMMUNOHISTOCHEMISTRY  MISMATCH REPAIR PROTEINS:   MLH1: Intact nuclear expression  MSH2: Intact nuclear expression  MSH6: Intact nuclear expression  PMS2: Intact nuclear expression   Interpretation: No loss of nuclear expression of mismatch repair  proteins: Low probability of MSI-H.    Assessment    Local inflammatory process in the midline wound. Should resolve with silver nitrate application.    Plan    Patient will follow-up as scheduled 03/11/2017.  The patient may increase her activity as tolerated.    Follow up as scheduled.  HPI, Physical Exam, Assessment and Plan have been scribed under the direction and in the presence of Robert Bellow, MD. Karie Fetch, RN  I have completed the exam and reviewed the above documentation for accuracy and completeness.  I agree with the above.  Haematologist has been used and any errors in dictation or transcription are unintentional.  Hervey Ard, M.D., F.A.C.S.  Robert Bellow 02/25/2017, 8:29 PM

## 2017-02-25 NOTE — Patient Instructions (Signed)
The patient is aware to call back for any questions or concerns.  

## 2017-02-26 ENCOUNTER — Ambulatory Visit
Admission: RE | Admit: 2017-02-26 | Discharge: 2017-02-26 | Disposition: A | Discharge status: Home / Self Care | Payer: Medicare Other | Source: Ambulatory Visit | Attending: Oncology | Admitting: Oncology

## 2017-02-26 DIAGNOSIS — C182 Malignant neoplasm of ascending colon: Secondary | ICD-10-CM | POA: Diagnosis not present

## 2017-02-26 DIAGNOSIS — R079 Chest pain, unspecified: Secondary | ICD-10-CM | POA: Diagnosis not present

## 2017-02-26 MED ORDER — IOPAMIDOL (ISOVUE-300) INJECTION 61%
75.0000 mL | Freq: Once | INTRAVENOUS | Status: AC | PRN
  Administered 2017-02-26: 75 mL via INTRAVENOUS

## 2017-03-05 ENCOUNTER — Other Ambulatory Visit: Payer: Self-pay | Admitting: *Deleted

## 2017-03-05 DIAGNOSIS — C182 Malignant neoplasm of ascending colon: Secondary | ICD-10-CM

## 2017-03-08 ENCOUNTER — Ambulatory Visit: Payer: Self-pay

## 2017-03-08 ENCOUNTER — Inpatient Hospital Stay (HOSPITAL_BASED_OUTPATIENT_CLINIC_OR_DEPARTMENT_OTHER): Payer: Medicare Other | Admitting: Oncology

## 2017-03-08 ENCOUNTER — Inpatient Hospital Stay: Payer: Medicare Other | Attending: Oncology

## 2017-03-08 ENCOUNTER — Encounter: Payer: Self-pay | Admitting: Oncology

## 2017-03-08 VITALS — BP 120/72 | HR 83 | Temp 97.6°F | Resp 12 | Ht 62.0 in | Wt 170.0 lb

## 2017-03-08 DIAGNOSIS — Z79899 Other long term (current) drug therapy: Secondary | ICD-10-CM

## 2017-03-08 DIAGNOSIS — K219 Gastro-esophageal reflux disease without esophagitis: Secondary | ICD-10-CM | POA: Diagnosis not present

## 2017-03-08 DIAGNOSIS — K5730 Diverticulosis of large intestine without perforation or abscess without bleeding: Secondary | ICD-10-CM | POA: Insufficient documentation

## 2017-03-08 DIAGNOSIS — D509 Iron deficiency anemia, unspecified: Secondary | ICD-10-CM | POA: Insufficient documentation

## 2017-03-08 DIAGNOSIS — D500 Iron deficiency anemia secondary to blood loss (chronic): Secondary | ICD-10-CM

## 2017-03-08 DIAGNOSIS — E039 Hypothyroidism, unspecified: Secondary | ICD-10-CM | POA: Insufficient documentation

## 2017-03-08 DIAGNOSIS — C182 Malignant neoplasm of ascending colon: Secondary | ICD-10-CM

## 2017-03-08 DIAGNOSIS — D649 Anemia, unspecified: Secondary | ICD-10-CM | POA: Diagnosis not present

## 2017-03-08 DIAGNOSIS — F3341 Major depressive disorder, recurrent, in partial remission: Secondary | ICD-10-CM | POA: Diagnosis not present

## 2017-03-08 DIAGNOSIS — G4730 Sleep apnea, unspecified: Secondary | ICD-10-CM | POA: Insufficient documentation

## 2017-03-08 DIAGNOSIS — R110 Nausea: Secondary | ICD-10-CM | POA: Diagnosis not present

## 2017-03-08 HISTORY — DX: Iron deficiency anemia, unspecified: D50.9

## 2017-03-08 LAB — CBC WITH DIFFERENTIAL/PLATELET
BASOS ABS: 0 10*3/uL (ref 0–0.1)
BASOS PCT: 1 %
EOS PCT: 1 %
Eosinophils Absolute: 0.1 10*3/uL (ref 0–0.7)
HCT: 30.2 % — ABNORMAL LOW (ref 35.0–47.0)
HEMOGLOBIN: 9.8 g/dL — AB (ref 12.0–16.0)
Lymphocytes Relative: 36 %
Lymphs Abs: 3.2 10*3/uL (ref 1.0–3.6)
MCH: 25.7 pg — AB (ref 26.0–34.0)
MCHC: 32.6 g/dL (ref 32.0–36.0)
MCV: 78.7 fL — ABNORMAL LOW (ref 80.0–100.0)
MONO ABS: 0.5 10*3/uL (ref 0.2–0.9)
Monocytes Relative: 5 %
NEUTROS ABS: 5.1 10*3/uL (ref 1.4–6.5)
Neutrophils Relative %: 57 %
PLATELETS: 383 10*3/uL (ref 150–440)
RBC: 3.84 MIL/uL (ref 3.80–5.20)
RDW: 15.9 % — ABNORMAL HIGH (ref 11.5–14.5)
WBC: 8.8 10*3/uL (ref 3.6–11.0)

## 2017-03-08 LAB — IRON AND TIBC
IRON: 16 ug/dL — AB (ref 28–170)
Saturation Ratios: 4 % — ABNORMAL LOW (ref 10.4–31.8)
TIBC: 404 ug/dL (ref 250–450)
UIBC: 388 ug/dL

## 2017-03-08 LAB — COMPREHENSIVE METABOLIC PANEL
ALBUMIN: 3.7 g/dL (ref 3.5–5.0)
ALK PHOS: 73 U/L (ref 38–126)
ALT: 16 U/L (ref 14–54)
ANION GAP: 6 (ref 5–15)
AST: 19 U/L (ref 15–41)
BILIRUBIN TOTAL: 0.4 mg/dL (ref 0.3–1.2)
BUN: 16 mg/dL (ref 6–20)
CALCIUM: 9 mg/dL (ref 8.9–10.3)
CO2: 26 mmol/L (ref 22–32)
Chloride: 106 mmol/L (ref 101–111)
Creatinine, Ser: 0.88 mg/dL (ref 0.44–1.00)
GFR calc Af Amer: >60 mL/min (ref >60)
GLUCOSE: 113 mg/dL — AB (ref 65–99)
Potassium: 3.8 mmol/L (ref 3.5–5.1)
Sodium: 138 mmol/L (ref 135–145)
TOTAL PROTEIN: 6.7 g/dL (ref 6.5–8.1)

## 2017-03-08 LAB — RETICULOCYTES
RBC.: 3.95 MIL/uL (ref 3.80–5.20)
Retic Count, Absolute: 47.4 10*3/uL (ref 19.0–183.0)
Retic Ct Pct: 1.2 % (ref 0.4–3.1)

## 2017-03-08 LAB — FERRITIN: Ferritin: 4 ng/mL — ABNORMAL LOW (ref 11–307)

## 2017-03-08 LAB — LACTATE DEHYDROGENASE: LDH: 129 U/L (ref 98–192)

## 2017-03-08 MED ORDER — PROCHLORPERAZINE MALEATE 10 MG PO TABS: 10.0000 mg | ORAL_TABLET | Freq: Four times a day (QID) | ORAL | 1 refills | Status: DC | PRN

## 2017-03-08 MED ORDER — ONDANSETRON HCL 8 MG PO TABS: 8.0000 mg | ORAL_TABLET | Freq: Two times a day (BID) | ORAL | 1 refills | Status: DC | PRN

## 2017-03-08 MED ORDER — LIDOCAINE-PRILOCAINE 2.5-2.5 % EX CREA: TOPICAL_CREAM | CUTANEOUS | 3 refills | Status: DC

## 2017-03-08 MED ORDER — LORAZEPAM 0.5 MG PO TABS: 0.5000 mg | ORAL_TABLET | Freq: Four times a day (QID) | ORAL | 0 refills | Status: DC | PRN

## 2017-03-08 NOTE — Progress Notes (Signed)
Hematology/Oncology Follow Up Note [facility] [phone] [phone] Patient Care Team: Lavera Guise, MD as PCP - General (Internal Medicine) Manya Silvas, MD (Gastroenterology) Bary Castilla Forest Gleason, MD (General Surgery) Clent Jacks, RN as Registered Nurse  REFERRING PROVIDER: Dr.Byrnett Dellis Filbert CHIEF COMPLAINTS/PURPOSE OF CONSULTATION:  Evaluation and management of colon cancer  HISTORY OF PRESENTING ILLNESS:  [name] is a @ 71 y.o.  female with PMH listed below who was referred to me for evaluation and management of colon cancer.  a colonoscopy done on 12/25/2016 by Dr. Vira Agar. Moves her bowels daily. Patient states she is nausea, states it started at the end of June .She states she was having pain in her left upper quadrant pain was seen in the ER on 12/24/2016 Upper and lower endoscopies dated 12/28/2016 reviewed which showed Cecal mass, biopsy-proven adenocarcinoma. Patient underwent laparoscopically assisted right hemicolectomy with ileotransverse colostomy. Pathology revealed pT3N0 adenocarcinoma, with perineural invasion and lymphvascular invasion  Patient also has experienced nausea since June this year and she was not able to tolerate perioperative antibiotics. Currently does not have nausea.    Interval History Husband, [name] is present at visit. Patient and her husband called during the interval that she has decided not to take adjuvant chemotherapy after discussed with her husband and family members. She still has intermittent nausea and takes zofran as needed.  She has ongoing fatigue for years. Denies any blood in the stool.    ROS  Constitutional: Negative for fever, night sweats,change in appetite. (+) fatigue.Marland Kitchen  HENT: Negative for ear pain, hearing loss, nasal bleeding Eyes: Negative for eye pain, double vision   Respiratory: Negative for wheezing, shortness of breath, cough Cardiovascular: Negative  for chest pain, palpitation.   Gastrointestinal: Negative abdominal pain, diarrhea, nausea vomiting Endocrine: Negative  Genitourinary: Negative for dysuria, hematuria, frequency Skin: Negative for rash, iching, bruising Neurological: Negative for headache, dizziness, seizure Hematological: Negative for easy bruising/bleeding, lymph node enlargement Psychiatric/Behavioral: Negative for depression, anxiety, suicidality  MEDICAL HISTORY:  Past Medical History:  Diagnosis Date  . Anemia   . Carcinoma ([place])   . Colon cancer ([place])   . GERD (gastroesophageal reflux disease)   . Hemorrhoids   . Hypothyroidism   . Iron deficiency anemia 03/08/2017  . Major depressive disorder, recurrent episode, mild ([employer])   . PONV (postoperative nausea and vomiting)   . Sleep apnea    USES CPAP    SURGICAL HISTORY: Past Surgical History:  Procedure Laterality Date  . ABDOMINAL HYSTERECTOMY  1980  . CHOLECYSTECTOMY      SOCIAL HISTORY: Social History   Socioeconomic History  . Marital status: Married    Spouse name: Not on file  . Number of children: Not on file  . Years of education: Not on file  . Highest education level: Not on file  Social Needs  . Financial resource strain: Not on file  . Food insecurity - worry: Not on file  . Food insecurity - inability: Not on file  . Transportation needs - medical: Not on file  . Transportation needs - non-medical: Not on file  Occupational History  . Not on file  Tobacco Use  . Smoking status: Never Smoker  . Smokeless tobacco: Never Used  Substance and Sexual Activity  . Alcohol use: Yes    Alcohol/week: 0.6 oz    Types: 1 Shots of liquor per week    Comment: RARE   . Drug use: No  . Sexual activity: Yes  Partners: Male    Birth control/protection: None  Other Topics Concern  . Not on file  Social History Narrative  . Not on file    FAMILY HISTORY: Family History  Problem Relation Age of Onset  . Depression Brother   .  Alcohol abuse Brother   . Suicidality Brother   . Stroke Mother   . Clotting disorder Brother   . Colon polyps Father   . Melanoma Sister   . Bipolar disorder Neg Hx     ALLERGIES:  is allergic to other and codeine.  MEDICATIONS:  Current Outpatient Medications  Medication Sig Dispense Refill  . cetirizine (ZYRTEC) 10 MG tablet Take 10 mg by mouth daily.    Marland Kitchen docusate sodium (COLACE) 100 MG capsule Take by mouth.    . estradiol (ESTRACE) 0.5 MG tablet Take 1 tablet (0.5 mg total) by mouth daily. 30 tablet 0  . lamoTRIgine (LAMICTAL) 200 MG tablet Take 1 tablet (200 mg total) by mouth daily. (Patient taking differently: Take 200 mg by mouth every morning. ) 30 tablet 2  . levothyroxine (SYNTHROID, LEVOTHROID) 88 MCG tablet Take 88 mcg by mouth daily before breakfast.   2  . LORazepam (ATIVAN) 1 MG tablet Take 1 mg by mouth at bedtime.     . methylphenidate (RITALIN) 5 MG tablet Take 1 tablet (5 mg total) by mouth daily. 30 tablet 0  . Multiple Vitamins-Minerals (MULTIVITAMIN WITH MINERALS) tablet Take 1 tablet by mouth daily.    Marland Kitchen omeprazole (PRILOSEC) 20 MG capsule Take 20 mg by mouth daily before breakfast.    . ondansetron (ZOFRAN-ODT) 8 MG disintegrating tablet Take 8 mg by mouth every morning. AND PRN    . thyroid (ARMOUR) 30 MG tablet Take 30 mg by mouth daily before breakfast.    . traZODone (DESYREL) 50 MG tablet Take 1 tablet (50 mg total) by mouth at bedtime. 30 tablet 2  . TRINTELLIX 20 MG TABS Take 20 mg by mouth daily. (Patient taking differently: Take 20 mg by mouth every morning. ) 30 tablet 2   No current facility-administered medications for this visit.       Marland Kitchen  PHYSICAL EXAMINATION: ECOG PERFORMANCE STATUS: 0 - Asymptomatic Vitals:   03/08/17 0922 03/08/17 0929  BP:  120/72  Pulse:  83  Resp: 12   Temp:  97.6 F (36.4 C)   Filed Weights   03/08/17 0922  Weight: 170 lb (77.1 kg)   Physical Exam  GENERAL: No distress, well nourished.  SKIN:  No rashes  or significant lesions  HEAD: Normocephalic, No masses, lesions, tenderness or abnormalities  EYES: Conjunctiva are pink, non icteric ENT: External ears normal ,lips , buccal mucosa, and tongue normal and mucous membranes are moist  LYMPH: No palpable cervical and axillary lymphadenopathy  LUNGS: Clear to auscultation, no crackles or wheezes HEART: Regular rate & rhythm, no murmurs, no gallops, S1 normal and S2 normal  ABDOMEN: Abdomen soft, non-tender, normal bowel sounds, I did not appreciate any  masses or organomegaly  MUSCULOSKELETAL: No CVA tenderness and no tenderness on percussion of the back or rib cage.  EXTREMITIES: No edema, no skin discoloration or tenderness NEURO: Alert & oriented, no focal motor/sensory deficits.  LABORATORY DATA:  I have reviewed the data as listed Lab Results  Component Value Date   WBC 8.8 03/08/2017   HGB 9.8 (L) 03/08/2017   HCT 30.2 (L) 03/08/2017   MCV 78.7 (L) 03/08/2017   PLT 383 03/08/2017   Recent Labs  12/24/16 1421 01/17/17 2119 01/21/17 1149 03/08/17 0916  NA 140 140  --  138  K 4.1 4.0  --  3.8  CL 107 107  --  106  CO2 27 25  --  26  GLUCOSE 104* 111*  --  113*  BUN 12 13  --  16  CREATININE 0.94 1.00 0.92 0.88  CALCIUM 9.1 9.2  --  9.0  GFRNONAA >60 56* >60 >60  GFRAA >60 >60 >60 >60  PROT 6.6 7.1  --  6.7  ALBUMIN 3.8 3.9  --  3.7  AST 16 22  --  19  ALT 17 21  --  16  ALKPHOS 67 73  --  73  BILITOT 0.2* 0.5  --  0.4    RADIOGRAPHIC STUDIES: I have personally reviewed the radiological images as listed and agreed with the findings in the report. CT abdomen pelvis with contrast.  IMPRESSION: 1. Negative for a bowel obstruction. 2. Irregular wall thickening of the cecum and ileocecal region presumably corresponding to the patient's history of colon cancer 3. Sigmoid colon diverticular disease without acute inflammation 4. Subcentimeter hypodense lesion in the left hepatic lobe too small to further  characterize  CT chest 02/26/2017 IMPRESSION: No evidence of metastatic disease in the chest. Subcentimeter hypodense lesion in the lateral segment left liver lobe, too small to characterize, for which 2 month stability has been demonstrated, probably benign.  Pathology Surgical Pathology  DIAGNOSIS:  A. RIGHT COLON; LAPAROSCOPIC-ASSISTED RIGHT COLECTOMY:  - INVASIVE ADENOCARCINOMA, MODERATELY DIFFERENTIATED, 4.6 CM.  - RESECTION MARGINS ARE NEGATIVE.  - POLYP WITH FEATURES OF A SESSILE SERRATED ADENOMA, 0.6 CM.  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  - NO TUMOR SEEN IN FOURTEEN LYMPH NODES (0/14).  - SEE SUMMARY BELOW.   COLON AND RECTUM:  Procedure: Right colectomy  Tumor Site: Ileocecal valve  Tumor Size: Greatest dimension: 4.6 cm  Macroscopic Tumor Perforation: Not specified  Histologic Type: Adenocarcinoma  Histologic Grade: G2; Moderately differentiated  Tumor Extension: Tumor invades through muscularis propria into  pericolorectal tissue  Margins: All margins uninvolved by invasive carcinoma, high-grade  dysplasia, intramucosal adenocarcinoma, and adenoma  Treatment Effect: No known presurgical therapy  Lymphovascular Invasion: Present  Perineural Invasion: Present  Tumor Deposits: Not identified  Regional Lymph Nodes: # examined: 14,  # involved: 0  Pathologic Stage Classification (pTNM, AJCC 8th Edition): pT3 pN0  TNM Descriptors: Not applicable    ASSESSMENT & PLAN:  1. Cancer of right colon ([place])   2. Microcytic anemia   3. Iron deficiency anemia due to chronic blood loss   Cancer Stage: pT3N0cM0, stage colon cancer.   #Check CT chest revealed no distant metastasis. Discussed with patient.  She has decided not to take adjuvant chemotherapy.  # Microcytic anemia, checked iron, TIBC and ferritin today which came back consistent with severe iron deficiency.  I have discussed with patient about the rationale and possible side  effects of IV iron supplementation.  Plan IV iron with Venofer 200mg  twice a week for 4 doses. Allergy reactions/infusion reaction including anaphylactic reaction discussed with patient. Patient voices understanding and willing to proceed.   All questions were answered. The patient knows to call the clinic with any problems questions or concerns.  Return of visit: 5 weeks with labs done a day before clinic visit.    Earlie Server, MD, PhD Hematology Oncology [facility] at [facility] Pager- [phone] 03/08/2017

## 2017-03-08 NOTE — Progress Notes (Signed)
Patient here for follow up no changes since her last appontment.

## 2017-03-09 ENCOUNTER — Ambulatory Visit: Payer: Medicare Other | Admitting: General Surgery

## 2017-03-09 LAB — CEA: CEA1: 0.8 ng/mL (ref 0.0–4.7)

## 2017-03-11 ENCOUNTER — Encounter: Payer: Self-pay | Admitting: General Surgery

## 2017-03-11 ENCOUNTER — Ambulatory Visit (INDEPENDENT_AMBULATORY_CARE_PROVIDER_SITE_OTHER): Payer: Medicare Other | Admitting: General Surgery

## 2017-03-11 VITALS — BP 140/70 | HR 76 | Resp 12 | Ht 62.0 in | Wt 169.0 lb

## 2017-03-11 DIAGNOSIS — C182 Malignant neoplasm of ascending colon: Secondary | ICD-10-CM

## 2017-03-11 NOTE — Progress Notes (Signed)
Patient ID: Jyl [place], female   DOB: Aug 21, 1945, 71 y.o.   MRN: [id number]  Chief Complaint  Patient presents with  . Follow-up    HPI [name] is a 71 y.o. female here today for her one month follow up right colectomy done on 01/21/2017. Patient states her incision is healed up ,no problems at this time.   The patient is being followed for persistent anemia with medical oncology. Scheduled for a iron infusion in the near future.  HPI  Past Medical History:  Diagnosis Date  . Anemia   . Carcinoma ([place])   . Colon cancer ([place])   . GERD (gastroesophageal reflux disease)   . Hemorrhoids   . Hypothyroidism   . Iron deficiency anemia 03/08/2017  . Major depressive disorder, recurrent episode, mild ([place])   . PONV (postoperative nausea and vomiting)   . Sleep apnea    USES CPAP    Past Surgical History:  Procedure Laterality Date  . ABDOMINAL HYSTERECTOMY  1980  . CHOLECYSTECTOMY     . ESOPHAGOGASTRODUODENOSCOPY (EGD) WITH PROPOFOL N/A 12/28/2016   Procedure: ESOPHAGOGASTRODUODENOSCOPY (EGD) WITH PROPOFOL;  Surgeon: Manya Silvas, MD;  Location: [facility] ENDOSCOPY;  Service: Endoscopy;  Laterality: N/A;  . LAPAROSCOPIC RIGHT COLECTOMY Right 01/21/2017   Procedure: LAPAROSCOPIC ASSISTED RIGHT COLECTOMY;  Surgeon: Robert Bellow, MD;  Location: [facility] ORS;  Service: General;  Laterality: Right;       Family History  Problem Relation Age of Onset  . Depression Brother   . Alcohol abuse Brother   . Suicidality Brother   . Stroke Mother   . Clotting disorder Brother   . Colon polyps Father   . Melanoma Sister   . Bipolar disorder Neg Hx     Social History Social History   Tobacco Use  . Smoking status: Never Smoker  . Smokeless tobacco: Never Used  Substance Use Topics  . Alcohol use: Yes    Alcohol/week: 0.6 oz    Types: 1 Shots of liquor per week    Comment: RARE   . Drug use: No    Allergies  Allergen Reactions  . Other      BAND-AIDS IF LEFT ON FOR EXTENDED PERIODS-REDNESS  . Codeine     Nausea    Current Outpatient Medications  Medication Sig Dispense Refill  . cetirizine (ZYRTEC) 10 MG tablet Take 10 mg by mouth daily.    Marland Kitchen docusate sodium (COLACE) 100 MG capsule Take by mouth.    . estradiol (ESTRACE) 0.5 MG tablet Take 1 tablet (0.5 mg total) by mouth daily. 30 tablet 0  . lamoTRIgine (LAMICTAL) 200 MG tablet Take 1 tablet (200 mg total) by mouth daily. (Patient taking differently: Take 200 mg by mouth every morning. ) 30 tablet 2  . levothyroxine (SYNTHROID, LEVOTHROID) 88 MCG tablet Take 88 mcg by mouth daily before breakfast.   2  . LORazepam (ATIVAN) 1 MG tablet Take 1 mg by mouth at bedtime.     . methylphenidate (RITALIN) 5 MG tablet Take 1 tablet (5 mg total) by mouth daily. 30 tablet 0  . Multiple Vitamins-Minerals (MULTIVITAMIN WITH MINERALS) tablet Take 1 tablet by mouth daily.    Marland Kitchen omeprazole (PRILOSEC) 20 MG capsule Take 20 mg by mouth daily before breakfast.    . ondansetron (ZOFRAN-ODT) 8 MG disintegrating tablet Take 8 mg by mouth every morning. AND PRN    . thyroid (ARMOUR) 30 MG tablet Take 30 mg by mouth daily before breakfast.    .  traZODone (DESYREL) 50 MG tablet Take 1 tablet (50 mg total) by mouth at bedtime. 30 tablet 2  . TRINTELLIX 20 MG TABS Take 20 mg by mouth daily. (Patient taking differently: Take 20 mg by mouth every morning. ) 30 tablet 2   No current facility-administered medications for this visit.     Review of Systems Review of Systems  Constitutional: Negative.   Respiratory: Negative.   Cardiovascular: Negative.     Blood pressure 140/70, pulse 76, resp. rate 12, height 5\' 2"  (1.575 m), weight 169 lb (76.7 kg).  Physical Exam Physical Exam  Constitutional: She is oriented to person, place, and time. She appears well-developed and well-nourished.  Abdominal:    Neurological: She is oriented to person, place, and time.  Skin: Skin is warm and dry.     Data Reviewed DIAGNOSIS:  A. RIGHT COLON; LAPAROSCOPIC-ASSISTED RIGHT COLECTOMY:  - INVASIVE ADENOCARCINOMA, MODERATELY DIFFERENTIATED, 4.6 CM.  - RESECTION MARGINS ARE NEGATIVE.  - POLYP WITH FEATURES OF A SESSILE SERRATED ADENOMA, 0.6 CM.  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  - NO TUMOR SEEN IN FOURTEEN LYMPH NODES (0/14).   Reoperative CEA: 1.0.  Patient has declined adjuvant chemotherapy.  Assessment    Doing well status post laparoscopically assisted right hemicolectomy.  Persistent anemia unresolved with oral iron therapy, for IV iron infusion.    Plan    Patient will be a candidate for a follow-up colonoscopy in one year. She should expect a contact from Dr. Percell [place] office in this regard.  Questionable value of ongoing CEA determination in a T1 N0 tumor, further follow-up ill be determined by medical oncology.  The patient may be as active as tolerated.     Patient to return as needed. The patient is aware to call back for any questions or concerns.   HPI, Physical Exam, Assessment and Plan have been scribed under the direction and in the presence of Hervey Ard, MD.  [name], CMA  I have completed the exam and reviewed the above documentation for accuracy and completeness.  I agree with the above.  Haematologist has been used and any errors in dictation or transcription are unintentional.  Hervey Ard, M.D., F.A.C.S.  Robert Bellow 03/11/2017, 8:17 PM

## 2017-03-11 NOTE — Patient Instructions (Signed)
Patient to return as needed. The patient is aware to call back for any questions or concerns. 

## 2017-03-15 ENCOUNTER — Inpatient Hospital Stay: Payer: Medicare Other

## 2017-03-15 VITALS — BP 132/75 | HR 71 | Temp 97.5°F | Resp 20

## 2017-03-15 DIAGNOSIS — D500 Iron deficiency anemia secondary to blood loss (chronic): Secondary | ICD-10-CM

## 2017-03-15 DIAGNOSIS — G4730 Sleep apnea, unspecified: Secondary | ICD-10-CM | POA: Diagnosis not present

## 2017-03-15 DIAGNOSIS — D509 Iron deficiency anemia, unspecified: Secondary | ICD-10-CM | POA: Diagnosis not present

## 2017-03-15 DIAGNOSIS — C182 Malignant neoplasm of ascending colon: Secondary | ICD-10-CM | POA: Diagnosis not present

## 2017-03-15 DIAGNOSIS — Z79899 Other long term (current) drug therapy: Secondary | ICD-10-CM | POA: Diagnosis not present

## 2017-03-15 DIAGNOSIS — E039 Hypothyroidism, unspecified: Secondary | ICD-10-CM | POA: Diagnosis not present

## 2017-03-15 MED ORDER — IRON SUCROSE 20 MG/ML IV SOLN
200.0000 mg | Freq: Once | INTRAVENOUS | Status: AC
  Administered 2017-03-15: 200 mg via INTRAVENOUS
  Filled 2017-03-15: qty 10

## 2017-03-17 ENCOUNTER — Ambulatory Visit (HOSPITAL_COMMUNITY): Payer: Self-pay | Admitting: Psychiatry

## 2017-03-19 ENCOUNTER — Encounter (HOSPITAL_COMMUNITY): Payer: Self-pay | Admitting: Psychiatry

## 2017-03-19 ENCOUNTER — Ambulatory Visit (INDEPENDENT_AMBULATORY_CARE_PROVIDER_SITE_OTHER): Payer: Medicare Other | Admitting: Psychiatry

## 2017-03-19 DIAGNOSIS — F900 Attention-deficit hyperactivity disorder, predominantly inattentive type: Secondary | ICD-10-CM

## 2017-03-19 DIAGNOSIS — Z818 Family history of other mental and behavioral disorders: Secondary | ICD-10-CM

## 2017-03-19 DIAGNOSIS — F331 Major depressive disorder, recurrent, moderate: Secondary | ICD-10-CM

## 2017-03-19 DIAGNOSIS — Z811 Family history of alcohol abuse and dependence: Secondary | ICD-10-CM | POA: Diagnosis not present

## 2017-03-19 DIAGNOSIS — C189 Malignant neoplasm of colon, unspecified: Secondary | ICD-10-CM | POA: Diagnosis not present

## 2017-03-19 DIAGNOSIS — Z79899 Other long term (current) drug therapy: Secondary | ICD-10-CM | POA: Diagnosis not present

## 2017-03-19 DIAGNOSIS — Z915 Personal history of self-harm: Secondary | ICD-10-CM | POA: Diagnosis not present

## 2017-03-19 MED ORDER — METHYLPHENIDATE HCL 5 MG PO TABS: 5.0000 mg | ORAL_TABLET | Freq: Every day | ORAL | 0 refills | Status: DC

## 2017-03-19 MED ORDER — LAMOTRIGINE 200 MG PO TABS: 200.0000 mg | ORAL_TABLET | ORAL | 2 refills | Status: DC

## 2017-03-19 MED ORDER — TRINTELLIX 20 MG PO TABS: 20.0000 mg | ORAL_TABLET | ORAL | 2 refills | Status: DC

## 2017-03-19 MED ORDER — TRAZODONE HCL 100 MG PO TABS: 100.0000 mg | ORAL_TABLET | Freq: Every day | ORAL | 2 refills | Status: DC

## 2017-03-19 NOTE — Progress Notes (Signed)
[facility] MD/PA/NP OP Progress Note  03/19/2017 10:37 AM [name]  MRN:  [id number]  Chief Complaint: I get some time tired.  I start getting iron infusion again.  HPI: [name] came for her follow-up appointment.  She is struggling with her physical symptoms lately.  She was diagnosed with colon cancer and she has surgery but she is improving.  She is leave that she does not have to do chemotherapy.  She is still having nausea and sometimes tired and weak.  She is getting iron infusion because her hemoglobin remains low.  Overall she describes her mood is fine.  She denies any irritability, anger, mania, psychosis or any hallucination.  She believes lack of energy is due to anemia but she has good spirits.  She denies any feeling of hopelessness or worthlessness.  Her attention and focus is good.  She shakes Ritalin that helps her multitasking and attention.  She sleeps okay but she wants to stop lorazepam due to habit-forming.  She wants to try higher dose of trazodone if needed.  She is compliant with Lamictal, trazodone, Ritalin and Trintellix.  She denies any rash, itching, tremors or shakes.  Her appetite is okay.  She is happy that her 2 sons are coming for Thanksgiving.  Patient is still like to move out to live close with her son who lives in [place].  Patient lives with her husband.  Patient denies drinking alcohol or using any illegal substances.  Visit Diagnosis:    ICD-10-CM   1. Moderate episode of recurrent major depressive disorder (HCC) F33.1 TRINTELLIX 20 MG TABS    traZODone (DESYREL) 100 MG tablet    lamoTRIgine (LAMICTAL) 200 MG tablet  2. Attention deficit hyperactivity disorder (ADHD), predominantly inattentive type F90.0 methylphenidate (RITALIN) 5 MG tablet    DISCONTINUED: methylphenidate (RITALIN) 5 MG tablet    DISCONTINUED: methylphenidate (RITALIN) 5 MG tablet    Past Psychiatric History: Reviewed. Patient has history of depression since age 18when she  took overdose on medication.Patient has at least 2 psychiatric hospitalization. Patient endorse history of anger issues and irritability. She denies any history of mania but reported history of impulsive behavior. She was psychological testing and diagnosed with ADD. She remember taking Concerta, Adderall , Wellbutrin, Prozac, lithium, Effexor, Lexapro, Abilify and Cymbalta. She remember the best treatment when she had given a female hormone to help endometriosis. In 2016 she has history of ECT treatment which was failed and then she did [place] which helped her. She remember Abilify lithium and Wellbutrin did not work. We tried Vyvanse but it makes her more anxious like Adderall did .  Patient denies any history of psychosis, hallucination, nightmares or flashback. She denies any history of physical sexual verbal abuse.  Past Medical History:  Past Medical History:  Diagnosis Date  . Anemia   . Carcinoma ([place])   . Colon cancer ([place])   . GERD (gastroesophageal reflux disease)   . Hemorrhoids   . Hypothyroidism   . Iron deficiency anemia 03/08/2017  . Major depressive disorder, recurrent episode, mild ([place])   . PONV (postoperative nausea and vomiting)   . Sleep apnea    USES CPAP    Past Surgical History:  Procedure Laterality Date  . ABDOMINAL HYSTERECTOMY  1980  . CHOLECYSTECTOMY    . COLONOSCOPY WITH PROPOFOL N/A 12/28/2016   Performed by Manya Silvas, MD at [facility]  . ESOPHAGOGASTRODUODENOSCOPY (EGD) WITH PROPOFOL N/A 12/28/2016   Performed by Manya Silvas, MD at  [facility] ENDOSCOPY  . LAPAROSCOPIC ASSISTED RIGHT COLECTOMY Right 01/21/2017   Performed by Robert Bellow, MD at [facility] ORS    Family Psychiatric History: Reviewed.  Family History:  Family History  Problem Relation Age of Onset  . Depression Brother   . Alcohol abuse Brother   . Suicidality Brother   . Stroke Mother   . Clotting disorder Brother   . Colon polyps Father   . Melanoma Sister   .  Bipolar disorder Neg Hx     Social History:  Social History   Socioeconomic History  . Marital status: Married    Spouse name: None  . Number of children: None  . Years of education: None  . Highest education level: None  Social Needs  . Financial resource strain: None  . Food insecurity - worry: None  . Food insecurity - inability: None  . Transportation needs - medical: None  . Transportation needs - non-medical: None  Occupational History  . None  Tobacco Use  . Smoking status: Never Smoker  . Smokeless tobacco: Never Used  Substance and Sexual Activity  . Alcohol use: Yes    Alcohol/week: 0.6 oz    Types: 1 Shots of liquor per week    Comment: RARE   . Drug use: No  . Sexual activity: Yes    Partners: Male    Birth control/protection: None  Other Topics Concern  . None  Social History Narrative  . None    Allergies:  Allergies  Allergen Reactions  . Other     BAND-AIDS IF LEFT ON FOR EXTENDED PERIODS-REDNESS  . Codeine     Nausea    Metabolic Disorder Labs: No results found for: HGBA1C, MPG No results found for: PROLACTIN No results found for: CHOL, TRIG, HDL, CHOLHDL, VLDL, LDLCALC Lab Results  Component Value Date   TSH 0.014 (L) 11/14/2013   TSH <0.008 (L) 07/18/2012    Therapeutic Level Labs: No results found for: LITHIUM No results found for: VALPROATE No components found for:  CBMZ  Current Medications: Current Outpatient Medications  Medication Sig Dispense Refill  . cetirizine (ZYRTEC) 10 MG tablet Take 10 mg by mouth daily.    Marland Kitchen docusate sodium (COLACE) 100 MG capsule Take by mouth.    . estradiol (ESTRACE) 0.5 MG tablet Take 1 tablet (0.5 mg total) by mouth daily. 30 tablet 0  . lamoTRIgine (LAMICTAL) 200 MG tablet Take 1 tablet (200 mg total) by mouth daily. (Patient taking differently: Take 200 mg by mouth every morning. ) 30 tablet 2  . levothyroxine (SYNTHROID, LEVOTHROID) 88 MCG tablet Take 88 mcg by mouth daily before breakfast.    2  . LORazepam (ATIVAN) 1 MG tablet Take 1 mg by mouth at bedtime.     . methylphenidate (RITALIN) 5 MG tablet Take 1 tablet (5 mg total) by mouth daily. 30 tablet 0  . Multiple Vitamins-Minerals (MULTIVITAMIN WITH MINERALS) tablet Take 1 tablet by mouth daily.    Marland Kitchen omeprazole (PRILOSEC) 20 MG capsule Take 20 mg by mouth daily before breakfast.    . ondansetron (ZOFRAN-ODT) 8 MG disintegrating tablet Take 8 mg by mouth every morning. AND PRN    . thyroid (ARMOUR) 30 MG tablet Take 30 mg by mouth daily before breakfast.    . traZODone (DESYREL) 50 MG tablet Take 1 tablet (50 mg total) by mouth at bedtime. 30 tablet 2  . TRINTELLIX 20 MG TABS Take 20 mg by mouth daily. (Patient taking differently: Take 20  mg by mouth every morning. ) 30 tablet 2   No current facility-administered medications for this visit.      Musculoskeletal: Strength & Muscle Tone: within normal limits Gait & Station: normal Patient leans: N/A  Psychiatric Specialty Exam: ROS  Blood pressure 124/72, pulse 88, height 5\' 2"  (1.575 m), weight 166 lb 12.8 oz (75.7 kg).Body mass index is 30.51 kg/m.  General Appearance: Well Groomed  Eye Contact:  Good  Speech:  Clear and Coherent  Volume:  Normal  Mood:  Euthymic  Affect:  Appropriate  Thought Process:  Goal Directed  Orientation:  Full (Time, Place, and Person)  Thought Content: Rumination   Suicidal Thoughts:  No  Homicidal Thoughts:  No  Memory:  Immediate;   Good Recent;   Good Remote;   Good  Judgement:  Good  Insight:  Good  Psychomotor Activity:  Normal  Concentration:  Concentration: Good and Attention Span: Good  Recall:  Good  Fund of Knowledge: Good  Language: Good  Akathisia:  No  Handed:  Right  AIMS (if indicated): not done  Assets:  Communication Skills Desire for Improvement Housing Resilience Social Support  ADL's:  Intact  Cognition: WNL  Sleep:  Good   Screenings: AUDIT     Admission (Discharged) from OP Visit from  07/15/2012 in [facility]  Alcohol Use Disorder Identification Test Final Score (AUDIT)  1       Assessment and Plan: Attention deficit disorder, inattentive type.  Major depressive disorder, recurrent.  I reviewed records from other providers including discharge summary and blood work results.  Her hemoglobin level is low.  She is getting her infusion.  Her mood is a stable.  I will continue Ritalin 5 mg daily which is helping her attention and focus.  I also recommended to try trazodone up to 200 mg if she cannot sleep since we are stopping the Lorazepam.  Continue Lamictal 200 mg daily and Trintellix 20 mg daily.  Patient has no concerns from the medication.  She is not interested in counseling.  Recommended to call us back if there is any question, concern if she feels worsening of the symptoms.  Follow-up in 3 months.   Kathlee Nations, MD 03/19/2017, 10:37 AM

## 2017-03-21 ENCOUNTER — Other Ambulatory Visit (HOSPITAL_COMMUNITY): Payer: Self-pay | Admitting: Psychiatry

## 2017-03-21 DIAGNOSIS — F331 Major depressive disorder, recurrent, moderate: Secondary | ICD-10-CM

## 2017-03-22 ENCOUNTER — Inpatient Hospital Stay: Payer: Medicare Other

## 2017-03-22 VITALS — BP 130/72 | HR 74 | Temp 98.8°F | Resp 18

## 2017-03-22 DIAGNOSIS — D509 Iron deficiency anemia, unspecified: Secondary | ICD-10-CM | POA: Diagnosis not present

## 2017-03-22 DIAGNOSIS — C182 Malignant neoplasm of ascending colon: Secondary | ICD-10-CM | POA: Diagnosis not present

## 2017-03-22 DIAGNOSIS — E039 Hypothyroidism, unspecified: Secondary | ICD-10-CM | POA: Diagnosis not present

## 2017-03-22 DIAGNOSIS — Z79899 Other long term (current) drug therapy: Secondary | ICD-10-CM | POA: Diagnosis not present

## 2017-03-22 DIAGNOSIS — D500 Iron deficiency anemia secondary to blood loss (chronic): Secondary | ICD-10-CM

## 2017-03-22 DIAGNOSIS — G4730 Sleep apnea, unspecified: Secondary | ICD-10-CM | POA: Diagnosis not present

## 2017-03-22 MED ORDER — IRON SUCROSE 20 MG/ML IV SOLN
200.0000 mg | Freq: Once | INTRAVENOUS | Status: AC
  Administered 2017-03-22: 200 mg via INTRAVENOUS
  Filled 2017-03-22: qty 10

## 2017-03-22 MED ORDER — SODIUM CHLORIDE 0.9 % IV SOLN
Freq: Once | INTRAVENOUS | Status: AC
  Administered 2017-03-22: 14:00:00 via INTRAVENOUS
  Filled 2017-03-22: qty 1000

## 2017-03-29 ENCOUNTER — Inpatient Hospital Stay: Payer: Medicare Other

## 2017-03-29 VITALS — BP 132/77 | HR 75 | Temp 98.9°F | Resp 18

## 2017-03-29 DIAGNOSIS — D500 Iron deficiency anemia secondary to blood loss (chronic): Secondary | ICD-10-CM | POA: Diagnosis not present

## 2017-03-29 DIAGNOSIS — E039 Hypothyroidism, unspecified: Secondary | ICD-10-CM | POA: Diagnosis not present

## 2017-03-29 DIAGNOSIS — C182 Malignant neoplasm of ascending colon: Secondary | ICD-10-CM | POA: Diagnosis not present

## 2017-03-29 DIAGNOSIS — G4730 Sleep apnea, unspecified: Secondary | ICD-10-CM | POA: Diagnosis not present

## 2017-03-29 DIAGNOSIS — Z79899 Other long term (current) drug therapy: Secondary | ICD-10-CM | POA: Diagnosis not present

## 2017-03-29 DIAGNOSIS — D509 Iron deficiency anemia, unspecified: Secondary | ICD-10-CM | POA: Diagnosis not present

## 2017-03-29 MED ORDER — IRON SUCROSE 20 MG/ML IV SOLN
200.0000 mg | Freq: Once | INTRAVENOUS | Status: AC
  Administered 2017-03-29: 200 mg via INTRAVENOUS
  Filled 2017-03-29: qty 10

## 2017-03-29 MED ORDER — SODIUM CHLORIDE 0.9 % IV SOLN
Freq: Once | INTRAVENOUS | Status: AC
  Administered 2017-03-29: 14:00:00 via INTRAVENOUS
  Filled 2017-03-29: qty 1000

## 2017-04-01 DIAGNOSIS — J069 Acute upper respiratory infection, unspecified: Secondary | ICD-10-CM | POA: Diagnosis not present

## 2017-04-05 ENCOUNTER — Inpatient Hospital Stay: Payer: Medicare Other

## 2017-04-14 ENCOUNTER — Inpatient Hospital Stay: Payer: Medicare Other | Attending: Oncology

## 2017-04-14 DIAGNOSIS — E039 Hypothyroidism, unspecified: Secondary | ICD-10-CM | POA: Diagnosis not present

## 2017-04-14 DIAGNOSIS — K219 Gastro-esophageal reflux disease without esophagitis: Secondary | ICD-10-CM | POA: Insufficient documentation

## 2017-04-14 DIAGNOSIS — Z79899 Other long term (current) drug therapy: Secondary | ICD-10-CM | POA: Insufficient documentation

## 2017-04-14 DIAGNOSIS — D500 Iron deficiency anemia secondary to blood loss (chronic): Secondary | ICD-10-CM | POA: Insufficient documentation

## 2017-04-14 DIAGNOSIS — K5730 Diverticulosis of large intestine without perforation or abscess without bleeding: Secondary | ICD-10-CM | POA: Insufficient documentation

## 2017-04-14 DIAGNOSIS — C182 Malignant neoplasm of ascending colon: Secondary | ICD-10-CM | POA: Diagnosis not present

## 2017-04-14 DIAGNOSIS — D509 Iron deficiency anemia, unspecified: Secondary | ICD-10-CM

## 2017-04-14 DIAGNOSIS — G4730 Sleep apnea, unspecified: Secondary | ICD-10-CM | POA: Insufficient documentation

## 2017-04-14 LAB — IRON AND TIBC
Iron: 58 ug/dL (ref 28–170)
Saturation Ratios: 16 % (ref 10.4–31.8)
TIBC: 359 ug/dL (ref 250–450)
UIBC: 301 ug/dL

## 2017-04-14 LAB — COMPREHENSIVE METABOLIC PANEL
ALBUMIN: 4.1 g/dL (ref 3.5–5.0)
ALT: 21 U/L (ref 14–54)
AST: 22 U/L (ref 15–41)
Alkaline Phosphatase: 83 U/L (ref 38–126)
Anion gap: 7 (ref 5–15)
BUN: 16 mg/dL (ref 6–20)
CHLORIDE: 102 mmol/L (ref 101–111)
CO2: 28 mmol/L (ref 22–32)
CREATININE: 0.98 mg/dL (ref 0.44–1.00)
Calcium: 9.1 mg/dL (ref 8.9–10.3)
GFR calc Af Amer: >60 mL/min (ref >60)
GFR calc non Af Amer: 58 mL/min — ABNORMAL LOW (ref >60)
GLUCOSE: 112 mg/dL — AB (ref 65–99)
POTASSIUM: 3.8 mmol/L (ref 3.5–5.1)
Sodium: 137 mmol/L (ref 135–145)
Total Bilirubin: 0.5 mg/dL (ref 0.3–1.2)
Total Protein: 6.9 g/dL (ref 6.5–8.1)

## 2017-04-14 LAB — CBC WITH DIFFERENTIAL/PLATELET
BASOS ABS: 0.1 10*3/uL (ref 0–0.1)
Basophils Relative: 1 %
Eosinophils Absolute: 0.1 10*3/uL (ref 0–0.7)
Eosinophils Relative: 1 %
HCT: 37.1 % (ref 35.0–47.0)
Hemoglobin: 12.1 g/dL (ref 12.0–16.0)
LYMPHS ABS: 3.2 10*3/uL (ref 1.0–3.6)
LYMPHS PCT: 36 %
MCH: 26.6 pg (ref 26.0–34.0)
MCHC: 32.5 g/dL (ref 32.0–36.0)
MCV: 81.8 fL (ref 80.0–100.0)
MONO ABS: 0.4 10*3/uL (ref 0.2–0.9)
MONOS PCT: 5 %
NEUTROS ABS: 5 10*3/uL (ref 1.4–6.5)
Neutrophils Relative %: 57 %
Platelets: 348 10*3/uL (ref 150–440)
RBC: 4.54 MIL/uL (ref 3.80–5.20)
RDW: 22.3 % — AB (ref 11.5–14.5)
WBC: 8.8 10*3/uL (ref 3.6–11.0)

## 2017-04-14 LAB — VITAMIN B12: VITAMIN B 12: 670 pg/mL (ref 180–914)

## 2017-04-14 LAB — FERRITIN: Ferritin: 53 ng/mL (ref 11–307)

## 2017-04-14 LAB — FOLATE: FOLATE: 41 ng/mL (ref >5.9)

## 2017-04-14 LAB — TSH: TSH: 0.022 u[IU]/mL — ABNORMAL LOW (ref 0.350–4.500)

## 2017-04-15 NOTE — Progress Notes (Signed)
Hematology/Oncology Follow Up Note [facility] Telephone:([phone] [phone] Patient Care Team: Lavera Guise, MD as PCP - General (Internal Medicine) Manya Silvas, MD (Gastroenterology) Bary Castilla Forest Gleason, MD (General Surgery) Clent Jacks, RN as Registered Nurse  REFERRING PROVIDER: Dr.Byrnett Dellis Filbert  REASON FOR VISIT Follow up for treatment of management of colon cancer and iron deficiency anemia.  HISTORY OF PRESENTING ILLNESS:  [name] is a @ 71 y.o.  female with PMH listed below who was referred to me for evaluation and management of colon cancer.  a colonoscopy done on 12/25/2016 by Dr. Vira Agar. Moves her bowels daily. Patient states she is nausea, states it started at the end of June .She states she was having pain in her left upper quadrant pain was seen in the ER on 12/24/2016 Upper and lower endoscopies dated 12/28/2016 reviewed which showed Cecal mass, biopsy-proven adenocarcinoma. Patient underwent laparoscopically assisted right hemicolectomy with ileotransverse colostomy. Pathology revealed pT3N0 adenocarcinoma, with perineural invasion and lymphvascular invasion  #  Cancer Stage: pT3N0cM0, stage colon cancer. She has decided not to take adjuvant chemotherapy.  Interval History Patient presents for follow-up of management of colon cancer and iron deficiency anemia. During interval patient had 3 doses of IV Venofer. Patient reports feeling much better. Along last fatigue. Good energy level. Appetite is good. Denies any chest pain, shortness of breath, abdominal pain, blood in the stool.  Review of Systems  Constitutional: Negative for fever.  HENT: Negative for hearing loss.   Eyes: Negative for blurred vision.  Respiratory: Negative for cough.   Cardiovascular: Negative for chest pain.  Gastrointestinal: Negative for heartburn.  Genitourinary: Negative for dysuria.  Musculoskeletal: Negative for myalgias.    Skin: Negative for rash.  Neurological: Negative for dizziness.  Endo/Heme/Allergies: Does not bruise/bleed easily.  Psychiatric/Behavioral: Negative for depression.     MEDICAL HISTORY:  Past Medical History:  Diagnosis Date  . Anemia   . Carcinoma ([place])   . Colon cancer ([place])   . GERD (gastroesophageal reflux disease)   . Hemorrhoids   . Hypothyroidism   . Iron deficiency anemia 03/08/2017  . Major depressive disorder, recurrent episode, mild ([place])   . PONV (postoperative nausea and vomiting)   . Sleep apnea    USES CPAP    SURGICAL HISTORY: Past Surgical History:  Procedure Laterality Date  . ABDOMINAL HYSTERECTOMY  1980  . CHOLECYSTECTOMY    . COLONOSCOPY WITH PROPOFOL N/A 12/28/2016   Procedure: COLONOSCOPY WITH PROPOFOL;  Surgeon: Manya Silvas, MD;  Location: [facility] ENDOSCOPY;  Service: Endoscopy;  Laterality: N/A;  . ESOPHAGOGASTRODUODENOSCOPY (EGD) WITH PROPOFOL N/A 12/28/2016   Procedure: ESOPHAGOGASTRODUODENOSCOPY (EGD) WITH PROPOFOL;  Surgeon: Manya Silvas, MD;  Location: [facility] ENDOSCOPY;  Service: Endoscopy;  Laterality: N/A;  . LAPAROSCOPIC RIGHT COLECTOMY Right 01/21/2017   Procedure: LAPAROSCOPIC ASSISTED RIGHT COLECTOMY;  Surgeon: Robert Bellow, MD;  Location: [facility] ORS;  Service: General;  Laterality: Right;    SOCIAL HISTORY: Social History   Socioeconomic History  . Marital status: Married    Spouse name: Not on file  . Number of children: Not on file  . Years of education: Not on file  . Highest education level: Not on file  Social Needs  . Financial resource strain: Not on file  . Food insecurity - worry: Not on file  . Food insecurity - inability: Not on file  . Transportation needs - medical: Not on file  . Transportation needs - non-medical: Not on file  Occupational History  . Not on file  Tobacco Use  . Smoking status: Never Smoker  . Smokeless tobacco: Never Used  Substance and Sexual Activity  . Alcohol use: Yes     Alcohol/week: 0.6 oz    Types: 1 Shots of liquor per week    Comment: RARE   . Drug use: No  . Sexual activity: Yes    Partners: Male    Birth control/protection: None  Other Topics Concern  . Not on file  Social History Narrative  . Not on file    FAMILY HISTORY: Family History  Problem Relation Age of Onset  . Depression Brother   . Alcohol abuse Brother   . Suicidality Brother   . Stroke Mother   . Clotting disorder Brother   . Colon polyps Father   . Melanoma Sister   . Bipolar disorder Neg Hx     ALLERGIES:  is allergic to other and codeine.  MEDICATIONS:  Current Outpatient Medications  Medication Sig Dispense Refill  . cetirizine (ZYRTEC) 10 MG tablet Take 10 mg by mouth daily.    Marland Kitchen docusate sodium (COLACE) 100 MG capsule Take by mouth.    . estradiol (ESTRACE) 0.5 MG tablet Take 1 tablet (0.5 mg total) by mouth daily. 30 tablet 0  . lamoTRIgine (LAMICTAL) 200 MG tablet Take 1 tablet (200 mg total) every morning by mouth. 30 tablet 2  . levothyroxine (SYNTHROID, LEVOTHROID) 88 MCG tablet Take 88 mcg by mouth daily before breakfast.   2  . methylphenidate (RITALIN) 5 MG tablet Take 1 tablet (5 mg total) daily by mouth. 30 tablet 0  . Multiple Vitamins-Minerals (MULTIVITAMIN WITH MINERALS) tablet Take 1 tablet by mouth daily.    Marland Kitchen omeprazole (PRILOSEC) 20 MG capsule Take 20 mg by mouth daily before breakfast.    . ondansetron (ZOFRAN-ODT) 8 MG disintegrating tablet Take 8 mg by mouth every morning. AND PRN    . Probiotic Product (PROBIOTIC-10) CAPS Take by mouth.    . thyroid (ARMOUR) 30 MG tablet Take 30 mg by mouth daily before breakfast.    . traZODone (DESYREL) 100 MG tablet Take 1 tablet (100 mg total) at bedtime by mouth. 30 tablet 2  . TRINTELLIX 20 MG TABS Take 20 mg every morning by mouth. 30 mg 2   No current facility-administered medications for this visit.       Marland Kitchen  PHYSICAL EXAMINATION: ECOG PERFORMANCE STATUS: 0 - Asymptomatic Vitals:   04/16/17  1344  BP: 121/70  Pulse: 85  Temp: 97.6 F (36.4 C)   Filed Weights   04/16/17 1344  Weight: 166 lb 9 oz (75.6 kg)   Physical Exam  Constitutional: She is oriented to person, place, and time and well-developed, well-nourished, and in no distress. No distress.  HENT:  Head: Normocephalic and atraumatic.  Eyes: Conjunctivae and EOM are normal. Pupils are equal, round, and reactive to light. No scleral icterus.  Neck: Normal range of motion. Neck supple.  Cardiovascular: Normal rate, regular rhythm and normal heart sounds.  Pulmonary/Chest: Breath sounds normal. No respiratory distress.  Abdominal: Soft. Bowel sounds are normal. She exhibits no distension.  Musculoskeletal: Normal range of motion. She exhibits no edema or tenderness.  Lymphadenopathy:    She has no cervical adenopathy.  Neurological: She is alert and oriented to person, place, and time. She displays normal reflexes.  Skin: Skin is warm and dry. No erythema.  Psychiatric: Affect and judgment normal.      LABORATORY DATA:  I have reviewed the data as listed Lab Results  Component Value Date   WBC 8.8 04/14/2017   HGB 12.1 04/14/2017   HCT 37.1 04/14/2017   MCV 81.8 04/14/2017   PLT 348 04/14/2017   Recent Labs    01/17/17 2119 01/21/17 1149 03/08/17 0916 04/14/17 1040  NA 140  --  138 137  K 4.0  --  3.8 3.8  CL 107  --  106 102  CO2 25  --  26 28  GLUCOSE 111*  --  113* 112*  BUN 13  --  16 16  CREATININE 1.00 0.92 0.88 0.98  CALCIUM 9.2  --  9.0 9.1  GFRNONAA 56* >60 >60 58*  GFRAA >60 >60 >60 >60  PROT 7.1  --  6.7 6.9  ALBUMIN 3.9  --  3.7 4.1  AST 22  --  19 22  ALT 21  --  16 21  ALKPHOS 73  --  73 83  BILITOT 0.5  --  0.4 0.5    RADIOGRAPHIC STUDIES: I have personally reviewed the radiological images as listed and agreed with the findings in the report. CT abdomen pelvis with contrast.  IMPRESSION: 1. Negative for a bowel obstruction. 2. Irregular wall thickening of the cecum and  ileocecal region presumably corresponding to the patient's history of colon cancer 3. Sigmoid colon diverticular disease without acute inflammation 4. Subcentimeter hypodense lesion in the left hepatic lobe too small to further characterize  CT chest 02/26/2017 IMPRESSION: No evidence of metastatic disease in the chest. Subcentimeter hypodense lesion in the lateral segment left liver lobe, too small to characterize, for which 2 month stability has been demonstrated, probably benign.  Pathology Surgical Pathology  DIAGNOSIS:  A. RIGHT COLON; LAPAROSCOPIC-ASSISTED RIGHT COLECTOMY:  - INVASIVE ADENOCARCINOMA, MODERATELY DIFFERENTIATED, 4.6 CM.  - RESECTION MARGINS ARE NEGATIVE.  - POLYP WITH FEATURES OF A SESSILE SERRATED ADENOMA, 0.6 CM.  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  - NO TUMOR SEEN IN FOURTEEN LYMPH NODES (0/14).  - SEE SUMMARY BELOW.   COLON AND RECTUM:  Procedure: Right colectomy  Tumor Site: Ileocecal valve  Tumor Size: Greatest dimension: 4.6 cm  Macroscopic Tumor Perforation: Not specified  Histologic Type: Adenocarcinoma  Histologic Grade: G2; Moderately differentiated  Tumor Extension: Tumor invades through muscularis propria into  pericolorectal tissue  Margins: All margins uninvolved by invasive carcinoma, high-grade  dysplasia, intramucosal adenocarcinoma, and adenoma  Treatment Effect: No known presurgical therapy  Lymphovascular Invasion: Present  Perineural Invasion: Present  Tumor Deposits: Not identified  Regional Lymph Nodes: # examined: 14,  # involved: 0  Pathologic Stage Classification (pTNM, AJCC 8th Edition): pT3 pN0  TNM Descriptors: Not applicable    ASSESSMENT & PLAN:  1. Iron deficiency anemia due to chronic blood loss   2. Cancer of right colon ([place])   3. Microcytic anemia   # Cancer Stage: pT3N0cM0, stage colon cancer. She has decided not to take adjuvant chemotherapy.  Plan history and physical  examination with tumor marker checking every 3 months, CT scan every 6 months for the first 2 years, followed by annually for the next 3 years. Patient follow-up with GI at one year milestone for repeat colonoscopy.  # Iron deficiency anemia : Status post 3 doses of Venofer, iron store improved. Will proceed with 4th dose of Venofer today.    All questions were answered. The patient knows to call the clinic with any problems questions or concerns.  Return of visit: 3 months  with labs and CT scan completed prior to visit.     Earlie Server, MD, PhD Hematology Oncology [facility] at [facility] Pager- [phone] 04/15/2017

## 2017-04-16 ENCOUNTER — Inpatient Hospital Stay (HOSPITAL_BASED_OUTPATIENT_CLINIC_OR_DEPARTMENT_OTHER): Payer: Medicare Other | Admitting: Oncology

## 2017-04-16 ENCOUNTER — Inpatient Hospital Stay: Payer: Medicare Other

## 2017-04-16 ENCOUNTER — Other Ambulatory Visit: Payer: Self-pay

## 2017-04-16 ENCOUNTER — Encounter: Payer: Self-pay | Admitting: Oncology

## 2017-04-16 VITALS — BP 117/68 | HR 73 | Resp 20

## 2017-04-16 VITALS — BP 121/70 | HR 85 | Temp 97.6°F | Wt 166.6 lb

## 2017-04-16 DIAGNOSIS — D500 Iron deficiency anemia secondary to blood loss (chronic): Secondary | ICD-10-CM

## 2017-04-16 DIAGNOSIS — K5730 Diverticulosis of large intestine without perforation or abscess without bleeding: Secondary | ICD-10-CM | POA: Diagnosis not present

## 2017-04-16 DIAGNOSIS — C182 Malignant neoplasm of ascending colon: Secondary | ICD-10-CM

## 2017-04-16 DIAGNOSIS — G4730 Sleep apnea, unspecified: Secondary | ICD-10-CM | POA: Diagnosis not present

## 2017-04-16 DIAGNOSIS — E039 Hypothyroidism, unspecified: Secondary | ICD-10-CM | POA: Diagnosis not present

## 2017-04-16 DIAGNOSIS — K219 Gastro-esophageal reflux disease without esophagitis: Secondary | ICD-10-CM | POA: Diagnosis not present

## 2017-04-16 MED ORDER — SODIUM CHLORIDE 0.9 % IV SOLN
Freq: Once | INTRAVENOUS | Status: AC
  Administered 2017-04-16: 14:00:00 via INTRAVENOUS
  Filled 2017-04-16: qty 1000

## 2017-04-16 MED ORDER — IRON SUCROSE 20 MG/ML IV SOLN
200.0000 mg | Freq: Once | INTRAVENOUS | Status: AC
  Administered 2017-04-16: 200 mg via INTRAVENOUS
  Filled 2017-04-16: qty 10

## 2017-04-16 NOTE — Progress Notes (Signed)
Patient here today follow up.  Patient states no new concerns today

## 2017-05-17 ENCOUNTER — Other Ambulatory Visit: Payer: Self-pay | Admitting: Oncology

## 2017-05-17 ENCOUNTER — Telehealth: Payer: Self-pay | Admitting: *Deleted

## 2017-05-17 DIAGNOSIS — D500 Iron deficiency anemia secondary to blood loss (chronic): Secondary | ICD-10-CM

## 2017-05-17 DIAGNOSIS — R5383 Other fatigue: Secondary | ICD-10-CM

## 2017-05-17 NOTE — Telephone Encounter (Signed)
Please schedule patient to have a lab encounter of cbc, ferritin and iron TIBC (ordered). Thank you.

## 2017-05-17 NOTE — Telephone Encounter (Signed)
Patient called voicing that she is very tired and is asking if she can get her labs checked. Please advise

## 2017-05-18 ENCOUNTER — Inpatient Hospital Stay: Payer: Medicare Other | Attending: Oncology

## 2017-05-18 DIAGNOSIS — D509 Iron deficiency anemia, unspecified: Secondary | ICD-10-CM | POA: Diagnosis not present

## 2017-05-18 DIAGNOSIS — C180 Malignant neoplasm of cecum: Secondary | ICD-10-CM | POA: Diagnosis not present

## 2017-05-18 DIAGNOSIS — D500 Iron deficiency anemia secondary to blood loss (chronic): Secondary | ICD-10-CM

## 2017-05-18 DIAGNOSIS — R5383 Other fatigue: Secondary | ICD-10-CM

## 2017-05-18 LAB — IRON AND TIBC
IRON: 66 ug/dL (ref 28–170)
SATURATION RATIOS: 18 % (ref 10.4–31.8)
TIBC: 366 ug/dL (ref 250–450)
UIBC: 300 ug/dL

## 2017-05-18 LAB — CBC
HCT: 39.5 % (ref 35.0–47.0)
Hemoglobin: 13 g/dL (ref 12.0–16.0)
MCH: 27.5 pg (ref 26.0–34.0)
MCHC: 32.8 g/dL (ref 32.0–36.0)
MCV: 83.8 fL (ref 80.0–100.0)
Platelets: 294 10*3/uL (ref 150–440)
RBC: 4.72 MIL/uL (ref 3.80–5.20)
RDW: 22.7 % — ABNORMAL HIGH (ref 11.5–14.5)
WBC: 8.1 10*3/uL (ref 3.6–11.0)

## 2017-05-18 LAB — FERRITIN: Ferritin: 46 ng/mL (ref 11–307)

## 2017-05-18 NOTE — Telephone Encounter (Signed)
Patient agrees to come in today at 1

## 2017-05-31 DIAGNOSIS — D2262 Melanocytic nevi of left upper limb, including shoulder: Secondary | ICD-10-CM | POA: Diagnosis not present

## 2017-05-31 DIAGNOSIS — Z85828 Personal history of other malignant neoplasm of skin: Secondary | ICD-10-CM | POA: Diagnosis not present

## 2017-05-31 DIAGNOSIS — X32XXXA Exposure to sunlight, initial encounter: Secondary | ICD-10-CM | POA: Diagnosis not present

## 2017-05-31 DIAGNOSIS — Z08 Encounter for follow-up examination after completed treatment for malignant neoplasm: Secondary | ICD-10-CM | POA: Diagnosis not present

## 2017-05-31 DIAGNOSIS — L239 Allergic contact dermatitis, unspecified cause: Secondary | ICD-10-CM | POA: Diagnosis not present

## 2017-05-31 DIAGNOSIS — L570 Actinic keratosis: Secondary | ICD-10-CM | POA: Diagnosis not present

## 2017-06-08 DIAGNOSIS — H43813 Vitreous degeneration, bilateral: Secondary | ICD-10-CM | POA: Diagnosis not present

## 2017-06-11 ENCOUNTER — Encounter: Payer: Self-pay | Admitting: Nurse Practitioner

## 2017-06-13 ENCOUNTER — Other Ambulatory Visit (HOSPITAL_COMMUNITY): Payer: Self-pay | Admitting: Psychiatry

## 2017-06-13 DIAGNOSIS — F331 Major depressive disorder, recurrent, moderate: Secondary | ICD-10-CM

## 2017-06-14 ENCOUNTER — Ambulatory Visit (INDEPENDENT_AMBULATORY_CARE_PROVIDER_SITE_OTHER): Payer: Medicare Other | Admitting: Nurse Practitioner

## 2017-06-14 ENCOUNTER — Encounter: Payer: Self-pay | Admitting: Nurse Practitioner

## 2017-06-14 VITALS — BP 118/70 | HR 77 | Resp 16 | Ht 62.0 in | Wt 162.0 lb

## 2017-06-14 DIAGNOSIS — R112 Nausea with vomiting, unspecified: Secondary | ICD-10-CM | POA: Diagnosis not present

## 2017-06-14 DIAGNOSIS — M25562 Pain in left knee: Secondary | ICD-10-CM | POA: Diagnosis not present

## 2017-06-14 DIAGNOSIS — M5432 Sciatica, left side: Secondary | ICD-10-CM

## 2017-06-14 DIAGNOSIS — N959 Unspecified menopausal and perimenopausal disorder: Secondary | ICD-10-CM

## 2017-06-14 DIAGNOSIS — K219 Gastro-esophageal reflux disease without esophagitis: Secondary | ICD-10-CM | POA: Diagnosis not present

## 2017-06-14 MED ORDER — ESTRADIOL 0.5 MG PO TABS: 0.5000 mg | ORAL_TABLET | Freq: Every day | ORAL | 4 refills | Status: DC

## 2017-06-14 MED ORDER — ONDANSETRON 8 MG PO TBDP: 8.0000 mg | ORAL_TABLET | ORAL | 4 refills | Status: DC

## 2017-06-14 MED ORDER — OMEPRAZOLE 20 MG PO CPDR: 20.0000 mg | DELAYED_RELEASE_CAPSULE | Freq: Every day | ORAL | 4 refills | Status: DC

## 2017-06-14 NOTE — Progress Notes (Signed)
[facility] [place], [place] [address]  Internal MEDICINE  Office Visit Note  Patient Name: [name]  [id number]  [id number]  Date of Service: 06/14/2017  Chief Complaint  Patient presents with  . Knee Pain    pain in left and also left leg     The patient is c/o left knee weakness. Feels like knee cap pops in and out of place. She also has pain and burning which starts in left hip and radiates down the left thigh and into the left calf. This affects her mostly when she is lying down and was present prior to the weakness in the left knee.    Knee Pain   The incident occurred more than 1 week ago. The incident occurred at home. There was no injury mechanism. The pain is present in the left knee. The patient is experiencing no pain. Associated symptoms include an inability to bear weight, muscle weakness and tingling. Pertinent negatives include no numbness. She reports no foreign bodies present. The symptoms are aggravated by movement and weight bearing. She has tried NSAIDs for the symptoms. The treatment provided mild relief.   Pt is here for a sick visit.     Current Medication:  Outpatient Encounter Medications as of 06/14/2017  Medication Sig  . cetirizine (ZYRTEC) 10 MG tablet Take 10 mg by mouth daily.  Marland Kitchen docusate sodium (COLACE) 100 MG capsule Take by mouth.  . estradiol (ESTRACE) 0.5 MG tablet Take 1 tablet (0.5 mg total) by mouth daily.  Marland Kitchen lamoTRIgine (LAMICTAL) 200 MG tablet Take 1 tablet (200 mg total) every morning by mouth.  . levothyroxine (SYNTHROID, LEVOTHROID) 88 MCG tablet Take 88 mcg by mouth daily before breakfast.   . methylphenidate (RITALIN) 5 MG tablet Take 1 tablet (5 mg total) daily by mouth.  . Multiple Vitamins-Minerals (MULTIVITAMIN WITH MINERALS) tablet Take 1 tablet by mouth daily.  Marland Kitchen omeprazole (PRILOSEC) 20 MG capsule Take 1 capsule (20 mg total) by mouth daily before breakfast.  . ondansetron  (ZOFRAN-ODT) 8 MG disintegrating tablet Take 1 tablet (8 mg total) by mouth every morning. AND PRN  . Probiotic Product (PROBIOTIC-10) CAPS Take by mouth.  . thyroid (ARMOUR) 30 MG tablet Take 30 mg by mouth daily before breakfast.  . traZODone (DESYREL) 100 MG tablet Take 1 tablet (100 mg total) at bedtime by mouth.  . TRINTELLIX 20 MG TABS Take 20 mg every morning by mouth.  . [DISCONTINUED] estradiol (ESTRACE) 0.5 MG tablet Take 1 tablet (0.5 mg total) by mouth daily.  . [DISCONTINUED] omeprazole (PRILOSEC) 20 MG capsule Take 20 mg by mouth daily before breakfast.  . [DISCONTINUED] ondansetron (ZOFRAN-ODT) 8 MG disintegrating tablet Take 8 mg by mouth every morning. AND PRN  . [DISCONTINUED] prochlorperazine (COMPAZINE) 10 MG tablet Take 1 tablet (10 mg total) every 6 (six) hours as needed by mouth (Nausea or vomiting).   No facility-administered encounter medications on file as of 06/14/2017.       Medical History: Past Medical History:  Diagnosis Date  . Anemia   . Carcinoma ([place])   . Colon cancer ([place]) 01/2017  . Fibromyalgia   . GERD (gastroesophageal reflux disease)   . Hemorrhoids   . Hypothyroidism   . Iron deficiency anemia 03/08/2017  . Major depressive disorder, recurrent episode, mild ([place])   . PONV (postoperative nausea and vomiting)   . Sleep apnea    USES CPAP     Today's Vitals   06/14/17 1029  BP: 118/70  Pulse: 77  Resp: 16  SpO2: 98%  Weight: 162 lb (73.5 kg)  Height: 5\' 2"  (1.575 m)    Review of Systems  Constitutional: Negative for activity change, chills, fatigue and unexpected weight change.  HENT: Negative for congestion, postnasal drip, rhinorrhea, sneezing and sore throat.   Eyes: Negative for redness.  Respiratory: Negative for cough, chest tightness and shortness of breath.   Cardiovascular: Negative for chest pain and palpitations.  Gastrointestinal: Negative for abdominal pain, constipation, diarrhea, nausea and vomiting.  Endocrine:  Negative for cold intolerance, heat intolerance, polydipsia, polyphagia and polyuria.  Genitourinary: Negative for dysuria and frequency.  Musculoskeletal: Positive for arthralgias and back pain. Negative for joint swelling and neck pain.  Skin: Negative for rash.  Allergic/Immunologic: Negative for environmental allergies, food allergies and immunocompromised state.  Neurological: Positive for tingling. Negative for tremors, numbness and headaches.  Hematological: Negative for adenopathy. Does not bruise/bleed easily.  Psychiatric/Behavioral: Negative for behavioral problems (Depression), sleep disturbance and suicidal ideas. The patient is not nervous/anxious.     Physical Exam  Constitutional: She is oriented to person, place, and time. She appears well-developed and well-nourished. No distress.  HENT:  Head: Normocephalic and atraumatic.  Mouth/Throat: Oropharynx is clear and moist. No oropharyngeal exudate.  Eyes: EOM are normal. Pupils are equal, round, and reactive to light.  Neck: Normal range of motion. Neck supple. No JVD present. Carotid bruit is not present. No tracheal deviation present. No thyromegaly present.  Cardiovascular: Normal rate, regular rhythm and normal heart sounds. Exam reveals no gallop and no friction rub.  No murmur heard. Pulmonary/Chest: Effort normal and breath sounds normal. No respiratory distress. She has no wheezes. She has no rales. She exhibits no tenderness.  Abdominal: Soft. Bowel sounds are normal.  Musculoskeletal:  Left knee discomfort. Most severe along lateral aspect of the knee. Mild swelling present.   Lymphadenopathy:    She has no cervical adenopathy.  Neurological: She is alert and oriented to person, place, and time. No cranial nerve deficit.  Skin: Skin is warm and dry. She is not diaphoretic.  Psychiatric: She has a normal mood and affect. Her behavior is normal. Judgment and thought content normal.  Nursing note and vitals  reviewed.  Assessment/Plan: 1. Left knee pain, unspecified chronicity Advised her to rest, ice, and elevate the knee when possible. Written order for phsyical therapy give. Pivot physical therpay recommended.   2. Left sided sciatica Written order for physical therpay ordered for sciatica.   3. Nausea and vomiting in adult - ondansetron (ZOFRAN-ODT) 8 MG disintegrating tablet; Take 1 tablet (8 mg total) by mouth every morning. AND PRN  Dispense: 90 tablet; Refill: 4  4. Unspecified menopausal and perimenopausal disorder - estradiol (ESTRACE) 0.5 MG tablet; Take 1 tablet (0.5 mg total) by mouth daily.  Dispense: 90 tablet; Refill: 4  5. Gastroesophageal reflux disease without esophagitis - omeprazole (PRILOSEC) 20 MG capsule; Take 1 capsule (20 mg total) by mouth daily before breakfast.  Dispense: 90 capsule; Refill: 4   General Counseling: allianna beaubien understanding of the findings of todays visit and agrees with plan of treatment. I have discussed any further diagnostic evaluation that may be needed or ordered today. We also reviewed her medications today. she has been encouraged to call the office with any questions or concerns that should arise related to todays visit.   This patient was seen by Leretha Pol, FNP- C in Collaboration with Dr Lavera Guise as a part of  collaborative care agreement    Meds ordered this encounter  Medications  . omeprazole (PRILOSEC) 20 MG capsule    Sig: Take 1 capsule (20 mg total) by mouth daily before breakfast.    Dispense:  90 capsule    Refill:  4    Order Specific Question:   Supervising Provider    Answer:   Lavera Guise [place]  . estradiol (ESTRACE) 0.5 MG tablet    Sig: Take 1 tablet (0.5 mg total) by mouth daily.    Dispense:  90 tablet    Refill:  4    Order Specific Question:   Supervising Provider    Answer:   Lavera Guise [[id number]]  . ondansetron (ZOFRAN-ODT) 8 MG disintegrating tablet    Sig: Take 1 tablet (8 mg total)  by mouth every morning. AND PRN    Dispense:  90 tablet    Refill:  4    Order Specific Question:   Supervising Provider    Answer:   Lavera Guise [[id number]]    Time spent: 15 Minutes

## 2017-06-15 ENCOUNTER — Ambulatory Visit (INDEPENDENT_AMBULATORY_CARE_PROVIDER_SITE_OTHER): Payer: Medicare Other | Admitting: Psychiatry

## 2017-06-15 ENCOUNTER — Encounter (HOSPITAL_COMMUNITY): Payer: Self-pay | Admitting: Psychiatry

## 2017-06-15 DIAGNOSIS — F331 Major depressive disorder, recurrent, moderate: Secondary | ICD-10-CM

## 2017-06-15 DIAGNOSIS — Z818 Family history of other mental and behavioral disorders: Secondary | ICD-10-CM

## 2017-06-15 DIAGNOSIS — Z915 Personal history of self-harm: Secondary | ICD-10-CM | POA: Diagnosis not present

## 2017-06-15 DIAGNOSIS — Z79899 Other long term (current) drug therapy: Secondary | ICD-10-CM | POA: Diagnosis not present

## 2017-06-15 DIAGNOSIS — F900 Attention-deficit hyperactivity disorder, predominantly inattentive type: Secondary | ICD-10-CM

## 2017-06-15 DIAGNOSIS — Z811 Family history of alcohol abuse and dependence: Secondary | ICD-10-CM

## 2017-06-15 MED ORDER — LAMOTRIGINE 200 MG PO TABS: 200.0000 mg | ORAL_TABLET | ORAL | 0 refills | Status: DC

## 2017-06-15 MED ORDER — TRINTELLIX 20 MG PO TABS: 20.0000 mg | ORAL_TABLET | ORAL | 0 refills | Status: DC

## 2017-06-15 MED ORDER — TRAZODONE HCL 100 MG PO TABS: 100.0000 mg | ORAL_TABLET | Freq: Every day | ORAL | 0 refills | Status: DC

## 2017-06-15 MED ORDER — METHYLPHENIDATE HCL 5 MG PO TABS: 5.0000 mg | ORAL_TABLET | Freq: Every day | ORAL | 0 refills | Status: DC

## 2017-06-15 NOTE — Progress Notes (Signed)
BH MD/PA/NP OP Progress Note  06/15/2017 10:35 AM [name]  MRN:  [id number]  Chief Complaint:  I am doing much better.  I have more energy.  HPI: Patient came for her follow-up appointment.  She finished iron infusion and seen improvement in her energy and concentration.  Her depression is also improved.  She is no longer taking Ativan.  She had a good Christmas.  Her children came to visit her on her birthday.  She is sleeping good.  She denies any irritability, anger, mania or any psychosis.  Recently she seen her primary care physician and she had blood work.  Her hemoglobin A1c is 5.3.  She is somewhat concerned about her house which is on the market for 2 years.  She is hoping to sell the house so she can downsize.  She denies any paranoia or any hallucination.  She has no rash, itching, tremors or shakes.  Her sleep is good.  Her appetite is okay.  She like trazodone 100 mg.  She denies drinking alcohol or using any illegal substances.  She lives with her husband.  Visit Diagnosis:    ICD-10-CM   1. Moderate episode of recurrent major depressive disorder (HCC) F33.1 traZODone (DESYREL) 100 MG tablet    TRINTELLIX 20 MG TABS  2. Attention deficit hyperactivity disorder (ADHD), predominantly inattentive type F90.0 methylphenidate (RITALIN) 5 MG tablet    Past Psychiatric History: Reviewed. Patient has history of depression since age 18when she took overdose on medication.Patient has at least 2 psychiatric hospitalization. Patient had history of anger issues and irritability. She denies any history of mania but reported history of impulsive behavior. She had psychological testing and diagnosed with ADD. She remember taking Concerta, Adderall , Wellbutrin, Prozac, lithium, Effexor, Lexapro, Abilify and Cymbalta. She remember the best treatment when she had given a female hormone to help endometriosis. In 2016 she has history of ECT treatment which was failed and then she did  [place] which helped her. She remember Abilify lithium and Wellbutrin did not work. We triedVyvansebut it makes her more anxious like Adderall did .Patient denies any history of psychosis, hallucination, nightmares or flashback. She denies any history of physical sexual verbal abuse.  Past Medical History:  Past Medical History:  Diagnosis Date  . Anemia   . Carcinoma ([place])   . Colon cancer ([place]) 01/2017  . Fibromyalgia   . GERD (gastroesophageal reflux disease)   . Hemorrhoids   . Hypothyroidism   . Iron deficiency anemia 03/08/2017  . Major depressive disorder, recurrent episode, mild ([place])   . PONV (postoperative nausea and vomiting)   . Sleep apnea    USES CPAP    Past Surgical History:  Procedure Laterality Date  . ABDOMINAL HYSTERECTOMY  1990  . CATARACT EXTRACTION, BILATERAL Bilateral 05/2012  . CHOLECYSTECTOMY  2001  . COLONOSCOPY WITH PROPOFOL N/A 12/28/2016   Procedure: COLONOSCOPY WITH PROPOFOL;  Surgeon: Manya Silvas, MD;  Location: [facility] ENDOSCOPY;  Service: Endoscopy;  Laterality: N/A;  . ESOPHAGOGASTRODUODENOSCOPY (EGD) WITH PROPOFOL N/A 12/28/2016   Procedure: ESOPHAGOGASTRODUODENOSCOPY (EGD) WITH PROPOFOL;  Surgeon: Manya Silvas, MD;  Location: [facility] ENDOSCOPY;  Service: Endoscopy;  Laterality: N/A;  . LAPAROSCOPIC RIGHT COLECTOMY Right 01/21/2017   Procedure: LAPAROSCOPIC ASSISTED RIGHT COLECTOMY;  Surgeon: Robert Bellow, MD;  Location: [facility] ORS;  Service: General;  Laterality: Right;    Family Psychiatric History: Reviewed.  Family History:  Family History  Problem Relation Age of Onset  . Depression Brother   .  Alcohol abuse Brother   . Suicidality Brother   . Stroke Mother   . Clotting disorder Brother   . Colon polyps Father   . Melanoma Sister   . Bipolar disorder Neg Hx     Social History:  Social History   Socioeconomic History  . Marital status: Married    Spouse name: Not on file  . Number of children: Not on file  . Years of  education: Not on file  . Highest education level: Not on file  Social Needs  . Financial resource strain: Not on file  . Food insecurity - worry: Not on file  . Food insecurity - inability: Not on file  . Transportation needs - medical: Not on file  . Transportation needs - non-medical: Not on file  Occupational History  . Not on file  Tobacco Use  . Smoking status: Never Smoker  . Smokeless tobacco: Never Used  Substance and Sexual Activity  . Alcohol use: Yes    Alcohol/week: 0.6 oz    Types: 1 Shots of liquor per week    Comment: RARE   . Drug use: No  . Sexual activity: Yes    Partners: Male    Birth control/protection: None  Other Topics Concern  . Not on file  Social History Narrative  . Not on file    Allergies:  Allergies  Allergen Reactions  . Other     BAND-AIDS IF LEFT ON FOR EXTENDED PERIODS-REDNESS  . Codeine     Nausea    Metabolic Disorder Labs: Recent Results (from the past 2160 hour(s))  Iron and TIBC     Status: None   Collection Time: 04/14/17 10:40 AM  Result Value Ref Range   Iron 58 28 - 170 ug/dL   TIBC 359 250 - 450 ug/dL   Saturation Ratios 16 10.4 - 31.8 %   UIBC 301 ug/dL  TSH     Status: Abnormal   Collection Time: 04/14/17 10:40 AM  Result Value Ref Range   TSH 0.022 (L) 0.350 - 4.500 uIU/mL    Comment: Performed by a 3rd Generation assay with a functional sensitivity of <=0.01 uIU/mL.  Folate     Status: None   Collection Time: 04/14/17 10:40 AM  Result Value Ref Range   Folate 41.0 >5.9 ng/mL  Vitamin B12     Status: None   Collection Time: 04/14/17 10:40 AM  Result Value Ref Range   Vitamin B-12 670 180 - 914 pg/mL    Comment: (NOTE) This assay is not validated for testing neonatal or myeloproliferative syndrome specimens for Vitamin B12 levels. Performed at [facility], [place] [address], [place], [place] [address]   Comprehensive metabolic panel     Status: Abnormal   Collection Time: 04/14/17 10:40 AM  Result  Value Ref Range   Sodium 137 135 - 145 mmol/L   Potassium 3.8 3.5 - 5.1 mmol/L   Chloride 102 101 - 111 mmol/L   CO2 28 22 - 32 mmol/L   Glucose, Bld 112 (H) 65 - 99 mg/dL   BUN 16 6 - 20 mg/dL   Creatinine, Ser 0.98 0.44 - 1.00 mg/dL   Calcium 9.1 8.9 - 10.3 mg/dL   Total Protein 6.9 6.5 - 8.1 g/dL   Albumin 4.1 3.5 - 5.0 g/dL   AST 22 15 - 41 U/L   ALT 21 14 - 54 U/L   Alkaline Phosphatase 83 38 - 126 U/L   Total Bilirubin 0.5 0.3 -  1.2 mg/dL   GFR calc non Af Amer 58 (L) >60 mL/min   GFR calc Af Amer >60 >60 mL/min    Comment: (NOTE) The eGFR has been calculated using the CKD EPI equation. This calculation has not been validated in all clinical situations. eGFR's persistently <60 mL/min signify possible Chronic Kidney Disease.    Anion gap 7 5 - 15  Ferritin     Status: None   Collection Time: 04/14/17 10:40 AM  Result Value Ref Range   Ferritin 53 11 - 307 ng/mL  CBC with Differential/Platelet     Status: Abnormal   Collection Time: 04/14/17 10:40 AM  Result Value Ref Range   WBC 8.8 3.6 - 11.0 K/uL   RBC 4.54 3.80 - 5.20 MIL/uL   Hemoglobin 12.1 12.0 - 16.0 g/dL   HCT 37.1 35.0 - 47.0 %   MCV 81.8 80.0 - 100.0 fL   MCH 26.6 26.0 - 34.0 pg   MCHC 32.5 32.0 - 36.0 g/dL   RDW 22.3 (H) 11.5 - 14.5 %   Platelets 348 150 - 440 K/uL   Neutrophils Relative % 57 %   Neutro Abs 5.0 1.4 - 6.5 K/uL   Lymphocytes Relative 36 %   Lymphs Abs 3.2 1.0 - 3.6 K/uL   Monocytes Relative 5 %   Monocytes Absolute 0.4 0.2 - 0.9 K/uL   Eosinophils Relative 1 %   Eosinophils Absolute 0.1 0 - 0.7 K/uL   Basophils Relative 1 %   Basophils Absolute 0.1 0 - 0.1 K/uL  Ferritin     Status: None   Collection Time: 05/18/17 12:54 PM  Result Value Ref Range   Ferritin 46 11 - 307 ng/mL    Comment: Performed at [facility], [address] [place], [place] [address]  CBC     Status: Abnormal   Collection Time: 05/18/17 12:54 PM  Result Value Ref Range   WBC 8.1 3.6 - 11.0 K/uL    RBC 4.72 3.80 - 5.20 MIL/uL   Hemoglobin 13.0 12.0 - 16.0 g/dL   HCT 39.5 35.0 - 47.0 %   MCV 83.8 80.0 - 100.0 fL   MCH 27.5 26.0 - 34.0 pg   MCHC 32.8 32.0 - 36.0 g/dL   RDW 22.7 (H) 11.5 - 14.5 %   Platelets 294 150 - 440 K/uL    Comment: Performed at [facility], [address] [place], [place] [address]  Iron and TIBC     Status: None   Collection Time: 05/18/17 12:54 PM  Result Value Ref Range   Iron 66 28 - 170 ug/dL   TIBC 366 250 - 450 ug/dL   Saturation Ratios 18 10.4 - 31.8 %   UIBC 300 ug/dL    Comment: Performed at [facility], [address] [place], [place] [address]   No results found for: HGBA1C, MPG No results found for: PROLACTIN No results found for: CHOL, TRIG, HDL, CHOLHDL, VLDL, LDLCALC Lab Results  Component Value Date   TSH 0.022 (L) 04/14/2017   TSH 0.014 (L) 11/14/2013    Therapeutic Level Labs: No results found for: LITHIUM No results found for: VALPROATE No components found for:  CBMZ  Current Medications: Current Outpatient Medications  Medication Sig Dispense Refill  . cetirizine (ZYRTEC) 10 MG tablet Take 10 mg by mouth daily.    Marland Kitchen docusate sodium (COLACE) 100 MG capsule Take by mouth.    . estradiol (ESTRACE) 0.5 MG tablet Take 1 tablet (0.5 mg  total) by mouth daily. 90 tablet 4  . lamoTRIgine (LAMICTAL) 200 MG tablet Take 1 tablet (200 mg total) every morning by mouth. 30 tablet 2  . levothyroxine (SYNTHROID, LEVOTHROID) 88 MCG tablet Take 88 mcg by mouth daily before breakfast.   2  . methylphenidate (RITALIN) 5 MG tablet Take 1 tablet (5 mg total) daily by mouth. 30 tablet 0  . Multiple Vitamins-Minerals (MULTIVITAMIN WITH MINERALS) tablet Take 1 tablet by mouth daily.    Marland Kitchen omeprazole (PRILOSEC) 20 MG capsule Take 1 capsule (20 mg total) by mouth daily before breakfast. 90 capsule 4  . ondansetron (ZOFRAN-ODT) 8 MG disintegrating tablet Take 1 tablet (8 mg total) by mouth every morning. AND PRN 90 tablet 4  .  Probiotic Product (PROBIOTIC-10) CAPS Take by mouth.    . thyroid (ARMOUR) 30 MG tablet Take 30 mg by mouth daily before breakfast.    . traZODone (DESYREL) 100 MG tablet Take 1 tablet (100 mg total) at bedtime by mouth. 30 tablet 2  . TRINTELLIX 20 MG TABS Take 20 mg every morning by mouth. 30 mg 2   No current facility-administered medications for this visit.      Musculoskeletal: Strength & Muscle Tone: within normal limits Gait & Station: normal Patient leans: N/A  Psychiatric Specialty Exam: ROS  Blood pressure 122/70, pulse 80, height 5' 2.4" (1.585 m), weight 162 lb 12.8 oz (73.8 kg).There is no height or weight on file to calculate BMI.  General Appearance: Well Groomed  Eye Contact:  Good  Speech:  Clear and Coherent  Volume:  Normal  Mood:  Euthymic  Affect:  Appropriate  Thought Process:  Goal Directed  Orientation:  Full (Time, Place, and Person)  Thought Content: Logical   Suicidal Thoughts:  No  Homicidal Thoughts:  No  Memory:  Immediate;   Good Recent;   Good Remote;   Good  Judgement:  Good  Insight:  Good  Psychomotor Activity:  Normal  Concentration:  Concentration: Good and Attention Span: Good  Recall:  Good  Fund of Knowledge: Good  Language: Good  Akathisia:  No  Handed:  Right  AIMS (if indicated): not done  Assets:  Communication Skills Desire for Improvement Housing Social Support  ADL's:  Intact  Cognition: WNL  Sleep:  Good   Screenings: AUDIT     Admission (Discharged) from OP Visit from 07/15/2012 in [facility]  Alcohol Use Disorder Identification Test Final Score (AUDIT)  1    PHQ2-9     Office Visit from 06/14/2017 in [facility]  PHQ-2 Total Score  0       Assessment and Plan: Major depressive disorder, recurrent.  Attention deficit disorder, inattentive type.  I reviewed blood work results and collect information from other providers.  Patient is doing better on her  medication.  Continue Ritalin 5 mg to help her focus and attention.  Continue trazodone 100 mg at bedtime.  She is no longer taking Ativan.  Continue Trental X 20 mg daily and Lamictal 200 mg daily.  Patient is not interested in counseling.  Recommended to call us back if she has any question or any concern.  Follow-up in 3 months.   Kathlee Nations, MD 06/15/2017, 10:35 AM

## 2017-06-23 DIAGNOSIS — M6281 Muscle weakness (generalized): Secondary | ICD-10-CM | POA: Diagnosis not present

## 2017-06-23 DIAGNOSIS — M25562 Pain in left knee: Secondary | ICD-10-CM | POA: Diagnosis not present

## 2017-06-23 DIAGNOSIS — R262 Difficulty in walking, not elsewhere classified: Secondary | ICD-10-CM | POA: Diagnosis not present

## 2017-06-23 DIAGNOSIS — M545 Low back pain: Secondary | ICD-10-CM | POA: Diagnosis not present

## 2017-06-25 DIAGNOSIS — M25562 Pain in left knee: Secondary | ICD-10-CM | POA: Diagnosis not present

## 2017-06-25 DIAGNOSIS — M545 Low back pain: Secondary | ICD-10-CM | POA: Diagnosis not present

## 2017-06-25 DIAGNOSIS — M6281 Muscle weakness (generalized): Secondary | ICD-10-CM | POA: Diagnosis not present

## 2017-06-25 DIAGNOSIS — R262 Difficulty in walking, not elsewhere classified: Secondary | ICD-10-CM | POA: Diagnosis not present

## 2017-06-30 DIAGNOSIS — R262 Difficulty in walking, not elsewhere classified: Secondary | ICD-10-CM | POA: Diagnosis not present

## 2017-06-30 DIAGNOSIS — M25562 Pain in left knee: Secondary | ICD-10-CM | POA: Diagnosis not present

## 2017-06-30 DIAGNOSIS — M6281 Muscle weakness (generalized): Secondary | ICD-10-CM | POA: Diagnosis not present

## 2017-06-30 DIAGNOSIS — M545 Low back pain: Secondary | ICD-10-CM | POA: Diagnosis not present

## 2017-07-02 DIAGNOSIS — M25562 Pain in left knee: Secondary | ICD-10-CM | POA: Diagnosis not present

## 2017-07-02 DIAGNOSIS — M6281 Muscle weakness (generalized): Secondary | ICD-10-CM | POA: Diagnosis not present

## 2017-07-02 DIAGNOSIS — M545 Low back pain: Secondary | ICD-10-CM | POA: Diagnosis not present

## 2017-07-02 DIAGNOSIS — R262 Difficulty in walking, not elsewhere classified: Secondary | ICD-10-CM | POA: Diagnosis not present

## 2017-07-07 DIAGNOSIS — M6281 Muscle weakness (generalized): Secondary | ICD-10-CM | POA: Diagnosis not present

## 2017-07-07 DIAGNOSIS — M545 Low back pain: Secondary | ICD-10-CM | POA: Diagnosis not present

## 2017-07-07 DIAGNOSIS — R262 Difficulty in walking, not elsewhere classified: Secondary | ICD-10-CM | POA: Diagnosis not present

## 2017-07-07 DIAGNOSIS — M25562 Pain in left knee: Secondary | ICD-10-CM | POA: Diagnosis not present

## 2017-07-12 DIAGNOSIS — M6281 Muscle weakness (generalized): Secondary | ICD-10-CM | POA: Diagnosis not present

## 2017-07-12 DIAGNOSIS — M25562 Pain in left knee: Secondary | ICD-10-CM | POA: Diagnosis not present

## 2017-07-12 DIAGNOSIS — M545 Low back pain: Secondary | ICD-10-CM | POA: Diagnosis not present

## 2017-07-12 DIAGNOSIS — R262 Difficulty in walking, not elsewhere classified: Secondary | ICD-10-CM | POA: Diagnosis not present

## 2017-07-13 ENCOUNTER — Ambulatory Visit
Admission: RE | Admit: 2017-07-13 | Discharge: 2017-07-13 | Disposition: A | Discharge status: Home / Self Care | Payer: Medicare Other | Source: Ambulatory Visit | Attending: Oncology | Admitting: Oncology

## 2017-07-13 DIAGNOSIS — M47816 Spondylosis without myelopathy or radiculopathy, lumbar region: Secondary | ICD-10-CM | POA: Diagnosis not present

## 2017-07-13 DIAGNOSIS — M5136 Other intervertebral disc degeneration, lumbar region: Secondary | ICD-10-CM | POA: Insufficient documentation

## 2017-07-13 DIAGNOSIS — C182 Malignant neoplasm of ascending colon: Secondary | ICD-10-CM

## 2017-07-13 DIAGNOSIS — I700 Atherosclerosis of aorta: Secondary | ICD-10-CM | POA: Insufficient documentation

## 2017-07-13 DIAGNOSIS — K5730 Diverticulosis of large intestine without perforation or abscess without bleeding: Secondary | ICD-10-CM | POA: Diagnosis not present

## 2017-07-13 DIAGNOSIS — C189 Malignant neoplasm of colon, unspecified: Secondary | ICD-10-CM | POA: Diagnosis not present

## 2017-07-13 LAB — POCT I-STAT CREATININE: CREATININE: 0.9 mg/dL (ref 0.44–1.00)

## 2017-07-13 MED ORDER — IOPAMIDOL (ISOVUE-300) INJECTION 61%
100.0000 mL | Freq: Once | INTRAVENOUS | Status: AC | PRN
  Administered 2017-07-13: 100 mL via INTRAVENOUS

## 2017-07-14 ENCOUNTER — Inpatient Hospital Stay: Payer: Medicare Other | Attending: Oncology

## 2017-07-14 DIAGNOSIS — C182 Malignant neoplasm of ascending colon: Secondary | ICD-10-CM | POA: Insufficient documentation

## 2017-07-14 DIAGNOSIS — R197 Diarrhea, unspecified: Secondary | ICD-10-CM | POA: Insufficient documentation

## 2017-07-14 DIAGNOSIS — D500 Iron deficiency anemia secondary to blood loss (chronic): Secondary | ICD-10-CM | POA: Insufficient documentation

## 2017-07-14 DIAGNOSIS — M6281 Muscle weakness (generalized): Secondary | ICD-10-CM | POA: Diagnosis not present

## 2017-07-14 DIAGNOSIS — M25562 Pain in left knee: Secondary | ICD-10-CM | POA: Diagnosis not present

## 2017-07-14 DIAGNOSIS — R262 Difficulty in walking, not elsewhere classified: Secondary | ICD-10-CM | POA: Diagnosis not present

## 2017-07-14 DIAGNOSIS — M545 Low back pain: Secondary | ICD-10-CM | POA: Diagnosis not present

## 2017-07-14 LAB — CBC WITH DIFFERENTIAL/PLATELET
BASOS ABS: 0.1 10*3/uL (ref 0–0.1)
Basophils Relative: 0 %
Eosinophils Absolute: 0 10*3/uL (ref 0–0.7)
Eosinophils Relative: 0 %
HEMATOCRIT: 37.5 % (ref 35.0–47.0)
Hemoglobin: 12.6 g/dL (ref 12.0–16.0)
LYMPHS PCT: 22 %
Lymphs Abs: 2.8 10*3/uL (ref 1.0–3.6)
MCH: 29.6 pg (ref 26.0–34.0)
MCHC: 33.5 g/dL (ref 32.0–36.0)
MCV: 88.4 fL (ref 80.0–100.0)
MONO ABS: 0.8 10*3/uL (ref 0.2–0.9)
MONOS PCT: 6 %
NEUTROS ABS: 9.1 10*3/uL — AB (ref 1.4–6.5)
Neutrophils Relative %: 72 %
Platelets: 271 10*3/uL (ref 150–440)
RBC: 4.24 MIL/uL (ref 3.80–5.20)
RDW: 14.4 % (ref 11.5–14.5)
WBC: 12.8 10*3/uL — ABNORMAL HIGH (ref 3.6–11.0)

## 2017-07-14 LAB — COMPREHENSIVE METABOLIC PANEL
ALBUMIN: 3.8 g/dL (ref 3.5–5.0)
ALT: 17 U/L (ref 14–54)
ANION GAP: 11 (ref 5–15)
AST: 19 U/L (ref 15–41)
Alkaline Phosphatase: 62 U/L (ref 38–126)
BUN: 15 mg/dL (ref 6–20)
CO2: 24 mmol/L (ref 22–32)
Calcium: 9.2 mg/dL (ref 8.9–10.3)
Chloride: 103 mmol/L (ref 101–111)
Creatinine, Ser: 0.94 mg/dL (ref 0.44–1.00)
GFR calc Af Amer: >60 mL/min (ref >60)
GFR calc non Af Amer: >60 mL/min (ref >60)
GLUCOSE: 102 mg/dL — AB (ref 65–99)
POTASSIUM: 3.9 mmol/L (ref 3.5–5.1)
Sodium: 138 mmol/L (ref 135–145)
TOTAL PROTEIN: 6.8 g/dL (ref 6.5–8.1)
Total Bilirubin: 0.5 mg/dL (ref 0.3–1.2)

## 2017-07-15 ENCOUNTER — Ambulatory Visit: Payer: Medicare Other

## 2017-07-15 LAB — CEA: CEA1: 1.1 ng/mL (ref 0.0–4.7)

## 2017-07-16 ENCOUNTER — Inpatient Hospital Stay: Payer: Medicare Other

## 2017-07-16 ENCOUNTER — Inpatient Hospital Stay (HOSPITAL_BASED_OUTPATIENT_CLINIC_OR_DEPARTMENT_OTHER): Payer: Medicare Other | Admitting: Oncology

## 2017-07-16 ENCOUNTER — Other Ambulatory Visit: Payer: Self-pay

## 2017-07-16 VITALS — BP 126/61 | HR 87 | Temp 97.7°F | Resp 18 | Wt 161.6 lb

## 2017-07-16 DIAGNOSIS — C182 Malignant neoplasm of ascending colon: Secondary | ICD-10-CM

## 2017-07-16 DIAGNOSIS — D509 Iron deficiency anemia, unspecified: Secondary | ICD-10-CM

## 2017-07-16 DIAGNOSIS — D500 Iron deficiency anemia secondary to blood loss (chronic): Secondary | ICD-10-CM

## 2017-07-16 DIAGNOSIS — R197 Diarrhea, unspecified: Secondary | ICD-10-CM

## 2017-07-16 NOTE — Progress Notes (Signed)
Here for follow up. Stated she is nauseous every am. Also stated she is having up to 5 watery BM daily.

## 2017-07-16 NOTE — Progress Notes (Signed)
Hematology/Oncology Follow Up Note [facility] [phone] [phone] Patient Care Team: Lavera Guise, MD as PCP - General (Internal Medicine) Manya Silvas, MD (Gastroenterology) Bary Castilla Forest Gleason, MD (General Surgery) Clent Jacks, RN as Registered Nurse  REFERRING PROVIDER: Dr.Byrnett Dellis Filbert  REASON FOR VISIT Follow up for treatment of management of colon cancer and iron deficiency anemia.  HISTORY OF PRESENTING ILLNESS:  [name] is a @ 72 y.o.  female with PMH listed below who was referred to me for evaluation and management of colon cancer.  a colonoscopy done on 12/25/2016 by Dr. Vira Agar. Moves her bowels daily. Patient states she is nausea, states it started at the end of June .She states she was having pain in her left upper quadrant pain was seen in the ER on 12/24/2016 Upper and lower endoscopies dated 12/28/2016 reviewed which showed Cecal mass, biopsy-proven adenocarcinoma. Patient underwent laparoscopically assisted right hemicolectomy with ileotransverse colostomy. Pathology revealed pT3N0 adenocarcinoma, with perineural invasion and lymphvascular invasion  #  Cancer Stage: pT3N0cM0, stage colon cancer. She has decided not to take adjuvant chemotherapy.  Interval History Patient presents for follow-up of management of colon cancer and iron deficiency anemia.  Patient reports feeling quite well. Appetite is good. Denies any chest pain, shortness of breath, abdominal pain, blood in the stool. She has watery diarrhea, about 5 bowel movement a day.  Interval CT showed no evidence of recurrent malignancy, 55mm hypodense lesion is stable.    Review of Systems  Constitutional: Negative for chills, fever, malaise/fatigue and weight loss.  HENT: Negative for hearing loss and tinnitus.   Eyes: Negative for blurred vision and pain.  Respiratory: Negative for cough and sputum production.   Cardiovascular: Negative  for chest pain, orthopnea and claudication.  Gastrointestinal: Positive for diarrhea. Negative for heartburn, nausea and vomiting.  Genitourinary: Negative for dysuria and frequency.  Musculoskeletal: Negative for myalgias and neck pain.  Skin: Negative for rash.  Neurological: Negative for dizziness, tingling and tremors.  Endo/Heme/Allergies: Negative for environmental allergies. Does not bruise/bleed easily.  Psychiatric/Behavioral: Negative for depression and substance abuse.     MEDICAL HISTORY:  Past Medical History:  Diagnosis Date  . Anemia   . Carcinoma ([place])   . Colon cancer ([place]) 01/2017  . Fibromyalgia   . GERD (gastroesophageal reflux disease)   . Hemorrhoids   . Hypothyroidism   . Iron deficiency anemia 03/08/2017  . Major depressive disorder, recurrent episode, mild ([place])   . PONV (postoperative nausea and vomiting)   . Sleep apnea    USES CPAP    SURGICAL HISTORY: Past Surgical History:  Procedure Laterality Date  . ABDOMINAL HYSTERECTOMY  1990  . CATARACT EXTRACTION, BILATERAL Bilateral 05/2012  . CHOLECYSTECTOMY  2001  . COLONOSCOPY WITH PROPOFOL N/A 12/28/2016   Procedure: COLONOSCOPY WITH PROPOFOL;  Surgeon: Manya Silvas, MD;  Location: [facility] ENDOSCOPY;  Service: Endoscopy;  Laterality: N/A;  . ESOPHAGOGASTRODUODENOSCOPY (EGD) WITH PROPOFOL N/A 12/28/2016   Procedure: ESOPHAGOGASTRODUODENOSCOPY (EGD) WITH PROPOFOL;  Surgeon: Manya Silvas, MD;  Location: [facility] ENDOSCOPY;  Service: Endoscopy;  Laterality: N/A;  . LAPAROSCOPIC RIGHT COLECTOMY Right 01/21/2017   Procedure: LAPAROSCOPIC ASSISTED RIGHT COLECTOMY;  Surgeon: Robert Bellow, MD;  Location: [facility] ORS;  Service: General;  Laterality: Right;    SOCIAL HISTORY: Social History   Socioeconomic History  . Marital status: Married    Spouse name: Not on file  . Number of children: Not on file  . Years of education: Not  on file  . Highest education level: Not on file  Social Needs  .  Financial resource strain: Not on file  . Food insecurity - worry: Not on file  . Food insecurity - inability: Not on file  . Transportation needs - medical: Not on file  . Transportation needs - non-medical: Not on file  Occupational History  . Not on file  Tobacco Use  . Smoking status: Never Smoker  . Smokeless tobacco: Never Used  Substance and Sexual Activity  . Alcohol use: Yes    Alcohol/week: 0.6 oz    Types: 1 Shots of liquor per week    Comment: RARE socially  . Drug use: No  . Sexual activity: Yes    Partners: Male    Birth control/protection: None  Other Topics Concern  . Not on file  Social History Narrative  . Not on file    FAMILY HISTORY: Family History  Problem Relation Age of Onset  . Depression Brother   . Alcohol abuse Brother   . Suicidality Brother   . Stroke Mother   . Clotting disorder Brother   . Colon polyps Father   . Melanoma Sister   . Bipolar disorder Neg Hx     ALLERGIES:  is allergic to other and codeine.  MEDICATIONS:  Current Outpatient Medications  Medication Sig Dispense Refill  . estradiol (ESTRACE) 0.5 MG tablet Take 1 tablet (0.5 mg total) by mouth daily. 90 tablet 4  . lamoTRIgine (LAMICTAL) 200 MG tablet Take 1 tablet (200 mg total) by mouth every morning. 90 tablet 0  . levothyroxine (SYNTHROID, LEVOTHROID) 88 MCG tablet Take 88 mcg by mouth daily before breakfast.   2  . methylphenidate (RITALIN) 5 MG tablet Take 1 tablet (5 mg total) by mouth daily. 90 tablet 0  . Multiple Vitamins-Minerals (MULTIVITAMIN WITH MINERALS) tablet Take 1 tablet by mouth daily.    Marland Kitchen omeprazole (PRILOSEC) 20 MG capsule Take 1 capsule (20 mg total) by mouth daily before breakfast. 90 capsule 4  . ondansetron (ZOFRAN-ODT) 8 MG disintegrating tablet Take 1 tablet (8 mg total) by mouth every morning. AND PRN 90 tablet 4  . thyroid (ARMOUR) 30 MG tablet Take 30 mg by mouth daily before breakfast.    . traZODone (DESYREL) 100 MG tablet Take 1 tablet  (100 mg total) by mouth at bedtime. 90 tablet 0  . TRINTELLIX 20 MG TABS Take 20 mg by mouth every morning. 90 mg 0  . cetirizine (ZYRTEC) 10 MG tablet Take 10 mg by mouth daily.     No current facility-administered medications for this visit.       Marland Kitchen  PHYSICAL EXAMINATION: ECOG PERFORMANCE STATUS: 0 - Asymptomatic Vitals:   07/16/17 1350  BP: 126/61  Pulse: 87  Resp: 18  Temp: 97.7 F (36.5 C)   Filed Weights   07/16/17 1350  Weight: 161 lb 9.6 oz (73.3 kg)   Physical Exam  Constitutional: She is oriented to person, place, and time and well-developed, well-nourished, and in no distress. No distress.  HENT:  Head: Normocephalic and atraumatic.  Mouth/Throat: Oropharynx is clear and moist. No oropharyngeal exudate.  Eyes: Conjunctivae and EOM are normal. Pupils are equal, round, and reactive to light. No scleral icterus.  Neck: Normal range of motion. Neck supple. No JVD present.  Cardiovascular: Normal rate, regular rhythm and normal heart sounds.  No murmur heard. Pulmonary/Chest: Breath sounds normal. No respiratory distress. She has no rales.  Abdominal: Soft. Bowel sounds are normal.  She exhibits no distension. There is no rebound.  Musculoskeletal: Normal range of motion. She exhibits no edema or tenderness.  Lymphadenopathy:    She has no cervical adenopathy.  Neurological: She is alert and oriented to person, place, and time. She displays normal reflexes. No cranial nerve deficit.  Skin: Skin is warm and dry. No rash noted. No erythema.  Psychiatric: Memory, affect and judgment normal.      LABORATORY DATA:  I have reviewed the data as listed Lab Results  Component Value Date   WBC 12.8 (H) 07/14/2017   HGB 12.6 07/14/2017   HCT 37.5 07/14/2017   MCV 88.4 07/14/2017   PLT 271 07/14/2017   Recent Labs    03/08/17 0916 04/14/17 1040 07/13/17 1112 07/14/17 1510  NA 138 137  --  138  K 3.8 3.8  --  3.9  CL 106 102  --  103  CO2 26 28  --  24  GLUCOSE  113* 112*  --  102*  BUN 16 16  --  15  CREATININE 0.88 0.98 0.90 0.94  CALCIUM 9.0 9.1  --  9.2  GFRNONAA >60 58*  --  >60  GFRAA >60 >60  --  >60  PROT 6.7 6.9  --  6.8  ALBUMIN 3.7 4.1  --  3.8  AST 19 22  --  19  ALT 16 21  --  17  ALKPHOS 73 83  --  62  BILITOT 0.4 0.5  --  0.5    RADIOGRAPHIC STUDIES: I have personally reviewed the radiological images as listed and agreed with the findings in the report. CT abdomen pelvis with contrast.  IMPRESSION: 1. Negative for a bowel obstruction. 2. Irregular wall thickening of the cecum and ileocecal region presumably corresponding to the patient's history of colon cancer 3. Sigmoid colon diverticular disease without acute inflammation 4. Subcentimeter hypodense lesion in the left hepatic lobe too small to further characterize  CT chest 02/26/2017 IMPRESSION: No evidence of metastatic disease in the chest. Subcentimeter hypodense lesion in the lateral segment left liver lobe, too small to characterize, for which 2 month stability has been demonstrated, probably benign.  Pathology Surgical Pathology  DIAGNOSIS:  A. RIGHT COLON; LAPAROSCOPIC-ASSISTED RIGHT COLECTOMY:  - INVASIVE ADENOCARCINOMA, MODERATELY DIFFERENTIATED, 4.6 CM.  - RESECTION MARGINS ARE NEGATIVE.  - POLYP WITH FEATURES OF A SESSILE SERRATED ADENOMA, 0.6 CM.  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  - NO TUMOR SEEN IN FOURTEEN LYMPH NODES (0/14).  - SEE SUMMARY BELOW.   COLON AND RECTUM:  Procedure: Right colectomy  Tumor Site: Ileocecal valve  Tumor Size: Greatest dimension: 4.6 cm  Macroscopic Tumor Perforation: Not specified  Histologic Type: Adenocarcinoma  Histologic Grade: G2; Moderately differentiated  Tumor Extension: Tumor invades through muscularis propria into  pericolorectal tissue  Margins: All margins uninvolved by invasive carcinoma, high-grade  dysplasia, intramucosal adenocarcinoma, and adenoma  Treatment  Effect: No known presurgical therapy  Lymphovascular Invasion: Present  Perineural Invasion: Present  Tumor Deposits: Not identified  Regional Lymph Nodes: # examined: 14,  # involved: 0  Pathologic Stage Classification (pTNM, AJCC 8th Edition): pT3 pN0  TNM Descriptors: Not applicable    ASSESSMENT & PLAN:  1. Iron deficiency anemia due to chronic blood loss   2. Cancer of right colon ([place])   3. Microcytic anemia    CEA stable. CT scan every 6 months for the first 2 years, followed by annually for the next 3 years.  Interval CT  showed no evidence of malignancy discussed with patient.  Chronic diarrhea, will check C diff. Advise patient to make an appointment to see her gastroenterologist. She voices understanding.  # Iron deficiency anemia : s/p Venofer, recheck iron panel in 3 months.    All questions were answered. The patient knows to call the clinic with any problems questions or concerns.  Return of visit: 3 months with labs     Earlie Server, MD, PhD Hematology Oncology [facility] at [facility] Pager- [phone] 07/16/2017

## 2017-07-16 NOTE — Progress Notes (Signed)
No treatment today per Dr. Yu.  

## 2017-07-18 ENCOUNTER — Encounter: Payer: Self-pay | Admitting: Oncology

## 2017-07-19 ENCOUNTER — Other Ambulatory Visit: Payer: Self-pay | Admitting: *Deleted

## 2017-07-19 DIAGNOSIS — M6281 Muscle weakness (generalized): Secondary | ICD-10-CM | POA: Diagnosis not present

## 2017-07-19 DIAGNOSIS — M25562 Pain in left knee: Secondary | ICD-10-CM | POA: Diagnosis not present

## 2017-07-19 DIAGNOSIS — M545 Low back pain: Secondary | ICD-10-CM | POA: Diagnosis not present

## 2017-07-19 DIAGNOSIS — R262 Difficulty in walking, not elsewhere classified: Secondary | ICD-10-CM | POA: Diagnosis not present

## 2017-07-21 ENCOUNTER — Other Ambulatory Visit: Payer: Self-pay | Admitting: Oncology

## 2017-07-21 DIAGNOSIS — M545 Low back pain: Secondary | ICD-10-CM | POA: Diagnosis not present

## 2017-07-21 DIAGNOSIS — M6281 Muscle weakness (generalized): Secondary | ICD-10-CM | POA: Diagnosis not present

## 2017-07-21 DIAGNOSIS — M25562 Pain in left knee: Secondary | ICD-10-CM | POA: Diagnosis not present

## 2017-07-21 DIAGNOSIS — R262 Difficulty in walking, not elsewhere classified: Secondary | ICD-10-CM | POA: Diagnosis not present

## 2017-07-21 DIAGNOSIS — R197 Diarrhea, unspecified: Secondary | ICD-10-CM

## 2017-07-21 NOTE — Progress Notes (Signed)
Open in error

## 2017-07-26 DIAGNOSIS — M25562 Pain in left knee: Secondary | ICD-10-CM | POA: Diagnosis not present

## 2017-07-26 DIAGNOSIS — M6281 Muscle weakness (generalized): Secondary | ICD-10-CM | POA: Diagnosis not present

## 2017-07-26 DIAGNOSIS — M545 Low back pain: Secondary | ICD-10-CM | POA: Diagnosis not present

## 2017-07-26 DIAGNOSIS — R262 Difficulty in walking, not elsewhere classified: Secondary | ICD-10-CM | POA: Diagnosis not present

## 2017-07-28 DIAGNOSIS — R262 Difficulty in walking, not elsewhere classified: Secondary | ICD-10-CM | POA: Diagnosis not present

## 2017-07-28 DIAGNOSIS — M25562 Pain in left knee: Secondary | ICD-10-CM | POA: Diagnosis not present

## 2017-07-28 DIAGNOSIS — M545 Low back pain: Secondary | ICD-10-CM | POA: Diagnosis not present

## 2017-07-28 DIAGNOSIS — M6281 Muscle weakness (generalized): Secondary | ICD-10-CM | POA: Diagnosis not present

## 2017-08-02 DIAGNOSIS — R262 Difficulty in walking, not elsewhere classified: Secondary | ICD-10-CM | POA: Diagnosis not present

## 2017-08-02 DIAGNOSIS — M6281 Muscle weakness (generalized): Secondary | ICD-10-CM | POA: Diagnosis not present

## 2017-08-02 DIAGNOSIS — M545 Low back pain: Secondary | ICD-10-CM | POA: Diagnosis not present

## 2017-08-02 DIAGNOSIS — M25562 Pain in left knee: Secondary | ICD-10-CM | POA: Diagnosis not present

## 2017-08-04 DIAGNOSIS — M6281 Muscle weakness (generalized): Secondary | ICD-10-CM | POA: Diagnosis not present

## 2017-08-04 DIAGNOSIS — M545 Low back pain: Secondary | ICD-10-CM | POA: Diagnosis not present

## 2017-08-04 DIAGNOSIS — R262 Difficulty in walking, not elsewhere classified: Secondary | ICD-10-CM | POA: Diagnosis not present

## 2017-08-04 DIAGNOSIS — M25562 Pain in left knee: Secondary | ICD-10-CM | POA: Diagnosis not present

## 2017-08-13 DIAGNOSIS — R262 Difficulty in walking, not elsewhere classified: Secondary | ICD-10-CM | POA: Diagnosis not present

## 2017-08-13 DIAGNOSIS — M25562 Pain in left knee: Secondary | ICD-10-CM | POA: Diagnosis not present

## 2017-08-13 DIAGNOSIS — M545 Low back pain: Secondary | ICD-10-CM | POA: Diagnosis not present

## 2017-08-13 DIAGNOSIS — M6281 Muscle weakness (generalized): Secondary | ICD-10-CM | POA: Diagnosis not present

## 2017-08-20 DIAGNOSIS — M25562 Pain in left knee: Secondary | ICD-10-CM | POA: Diagnosis not present

## 2017-08-20 DIAGNOSIS — M545 Low back pain: Secondary | ICD-10-CM | POA: Diagnosis not present

## 2017-08-20 DIAGNOSIS — M6281 Muscle weakness (generalized): Secondary | ICD-10-CM | POA: Diagnosis not present

## 2017-08-20 DIAGNOSIS — R262 Difficulty in walking, not elsewhere classified: Secondary | ICD-10-CM | POA: Diagnosis not present

## 2017-08-24 DIAGNOSIS — M6281 Muscle weakness (generalized): Secondary | ICD-10-CM | POA: Diagnosis not present

## 2017-08-24 DIAGNOSIS — M545 Low back pain: Secondary | ICD-10-CM | POA: Diagnosis not present

## 2017-08-24 DIAGNOSIS — M25562 Pain in left knee: Secondary | ICD-10-CM | POA: Diagnosis not present

## 2017-08-24 DIAGNOSIS — R262 Difficulty in walking, not elsewhere classified: Secondary | ICD-10-CM | POA: Diagnosis not present

## 2017-09-09 ENCOUNTER — Other Ambulatory Visit (HOSPITAL_COMMUNITY): Payer: Self-pay | Admitting: Psychiatry

## 2017-09-09 DIAGNOSIS — F331 Major depressive disorder, recurrent, moderate: Secondary | ICD-10-CM

## 2017-09-14 ENCOUNTER — Encounter: Payer: Self-pay | Admitting: Internal Medicine

## 2017-09-14 ENCOUNTER — Ambulatory Visit (INDEPENDENT_AMBULATORY_CARE_PROVIDER_SITE_OTHER): Payer: Medicare Other | Admitting: Psychiatry

## 2017-09-14 ENCOUNTER — Encounter (HOSPITAL_COMMUNITY): Payer: Self-pay | Admitting: Psychiatry

## 2017-09-14 DIAGNOSIS — F331 Major depressive disorder, recurrent, moderate: Secondary | ICD-10-CM

## 2017-09-14 DIAGNOSIS — Z811 Family history of alcohol abuse and dependence: Secondary | ICD-10-CM

## 2017-09-14 DIAGNOSIS — Z818 Family history of other mental and behavioral disorders: Secondary | ICD-10-CM

## 2017-09-14 DIAGNOSIS — F900 Attention-deficit hyperactivity disorder, predominantly inattentive type: Secondary | ICD-10-CM

## 2017-09-14 MED ORDER — TRINTELLIX 20 MG PO TABS: 20.0000 mg | ORAL_TABLET | ORAL | 2 refills | Status: DC

## 2017-09-14 MED ORDER — METHYLPHENIDATE HCL 5 MG PO TABS: 5.0000 mg | ORAL_TABLET | Freq: Every day | ORAL | 0 refills | Status: DC

## 2017-09-14 MED ORDER — LAMOTRIGINE 200 MG PO TABS: 200.0000 mg | ORAL_TABLET | ORAL | 0 refills | Status: DC

## 2017-09-14 MED ORDER — TRAZODONE HCL 100 MG PO TABS: 100.0000 mg | ORAL_TABLET | Freq: Every day | ORAL | 0 refills | Status: DC

## 2017-09-14 MED ORDER — METHYLPHENIDATE HCL 5 MG PO TABS
5.0000 mg | ORAL_TABLET | Freq: Every day | ORAL | 0 refills | Status: DC
Start: 2017-09-14 — End: 2017-09-14

## 2017-09-14 NOTE — Progress Notes (Signed)
BH MD/PA/NP OP Progress Note  09/14/2017 1:19 PM [name]  MRN:  [id number]  Chief Complaint: I am doing good.  Finally household.  HPI: Patient came for her follow-up appointment.  She is taking her medication and doing very well.  She is very relieved and excited because house so last week and now she moved in to her new place.  Patient told she went with her husband on a vacation trip and she had a good time.  She is sleeping good.  She describes her mood is good.  She denies any irritability, anger, mania, psychosis or any crying spells.  Her energy level is good.  She denies any feeling of hopelessness or worthlessness.  Her attention and concentration is good.  She is able to do multitasking and feel Ritalin helping her depression she has no tremors, shakes or any EPS.  Patient is scheduled to see her primary care physician Dr. Humphrey Rolls in July.  She also had blood work scheduled in June.  Visit Diagnosis:    ICD-10-CM   1. Moderate episode of recurrent major depressive disorder (HCC) F33.1 TRINTELLIX 20 MG TABS tablet    traZODone (DESYREL) 100 MG tablet    lamoTRIgine (LAMICTAL) 200 MG tablet  2. Attention deficit hyperactivity disorder (ADHD), predominantly inattentive type F90.0 methylphenidate (RITALIN) 5 MG tablet    DISCONTINUED: methylphenidate (RITALIN) 5 MG tablet    Past Psychiatric History: Reviewed. Patient has history of overdose at age 89.  She has 2 psychiatric hospitalization. Patient had history of anger, mania, irritability, impulsive behavior.  She had psychological testing and diagnosed with ADD. She took Concerta, Adderall, Wellbutrin, Prozac, lithium, Effexor, Lexapro, Abilify and Cymbalta. She remember the best treatment when she had given a female hormone to help endometriosis. In 2016 she has history of ECT treatment which was failed and then she did [place] which helped her. She remember Abilify lithium and Wellbutrin did not work. Vyvanse and Adderall  make her more anxious.Patient denies any history of psychosis, hallucination, nightmares or flashback. She denies any history of physical sexual verbal abuse.  Past Medical History:  Past Medical History:  Diagnosis Date  . Anemia   . Carcinoma ([place])   . Colon cancer ([place]) 01/2017  . Fibromyalgia   . GERD (gastroesophageal reflux disease)   . Hemorrhoids   . Hypothyroidism   . Iron deficiency anemia 03/08/2017  . Major depressive disorder, recurrent episode, mild ([place])   . PONV (postoperative nausea and vomiting)   . Sleep apnea    USES CPAP    Past Surgical History:  Procedure Laterality Date  . ABDOMINAL HYSTERECTOMY  1990  . CATARACT EXTRACTION, BILATERAL Bilateral 05/2012  . CHOLECYSTECTOMY  2001  . COLONOSCOPY WITH PROPOFOL N/A 12/28/2016   Procedure: COLONOSCOPY WITH PROPOFOL;  Surgeon: Manya Silvas, MD;  Location: [facility] ENDOSCOPY;  Service: Endoscopy;  Laterality: N/A;  . ESOPHAGOGASTRODUODENOSCOPY (EGD) WITH PROPOFOL N/A 12/28/2016   Procedure: ESOPHAGOGASTRODUODENOSCOPY (EGD) WITH PROPOFOL;  Surgeon: Manya Silvas, MD;  Location: [facility] ENDOSCOPY;  Service: Endoscopy;  Laterality: N/A;  . LAPAROSCOPIC RIGHT COLECTOMY Right 01/21/2017   Procedure: LAPAROSCOPIC ASSISTED RIGHT COLECTOMY;  Surgeon: Robert Bellow, MD;  Location: [facility] ORS;  Service: General;  Laterality: Right;    Family Psychiatric History: Reviewed.  Family History:  Family History  Problem Relation Age of Onset  . Depression Brother   . Alcohol abuse Brother   . Suicidality Brother   . Stroke Mother   . Clotting disorder Brother   .  Colon polyps Father   . Melanoma Sister   . Bipolar disorder Neg Hx     Social History:  Social History   Socioeconomic History  . Marital status: Married    Spouse name: Not on file  . Number of children: Not on file  . Years of education: Not on file  . Highest education level: Not on file  Occupational History  . Not on file  Social Needs  .  Financial resource strain: Not on file  . Food insecurity:    Worry: Not on file    Inability: Not on file  . Transportation needs:    Medical: Not on file    Non-medical: Not on file  Tobacco Use  . Smoking status: Never Smoker  . Smokeless tobacco: Never Used  Substance and Sexual Activity  . Alcohol use: Yes    Alcohol/week: 0.6 oz    Types: 1 Shots of liquor per week    Comment: RARE socially  . Drug use: No  . Sexual activity: Yes    Partners: Male    Birth control/protection: None  Lifestyle  . Physical activity:    Days per week: Not on file    Minutes per session: Not on file  . Stress: Not on file  Relationships  . Social connections:    Talks on phone: Not on file    Gets together: Not on file    Attends religious service: Not on file    Active member of club or organization: Not on file    Attends meetings of clubs or organizations: Not on file    Relationship status: Not on file  Other Topics Concern  . Not on file  Social History Narrative  . Not on file    Allergies:  Allergies  Allergen Reactions  . Other     BAND-AIDS IF LEFT ON FOR EXTENDED PERIODS-REDNESS  . Codeine     Nausea    Metabolic Disorder Labs: No results found for: HGBA1C, MPG No results found for: PROLACTIN No results found for: CHOL, TRIG, HDL, CHOLHDL, VLDL, LDLCALC Lab Results  Component Value Date   TSH 0.022 (L) 04/14/2017   TSH 0.014 (L) 11/14/2013    Therapeutic Level Labs: No results found for: LITHIUM No results found for: VALPROATE No components found for:  CBMZ  Current Medications: Current Outpatient Medications  Medication Sig Dispense Refill  . cetirizine (ZYRTEC) 10 MG tablet Take 10 mg by mouth daily.    Marland Kitchen estradiol (ESTRACE) 0.5 MG tablet Take 1 tablet (0.5 mg total) by mouth daily. 90 tablet 4  . lamoTRIgine (LAMICTAL) 200 MG tablet Take 1 tablet (200 mg total) by mouth every morning. 90 tablet 0  . levothyroxine (SYNTHROID, LEVOTHROID) 88 MCG tablet  Take 88 mcg by mouth daily before breakfast.   2  . methylphenidate (RITALIN) 5 MG tablet Take 1 tablet (5 mg total) by mouth daily. 90 tablet 0  . Multiple Vitamins-Minerals (MULTIVITAMIN WITH MINERALS) tablet Take 1 tablet by mouth daily.    Marland Kitchen omeprazole (PRILOSEC) 20 MG capsule Take 1 capsule (20 mg total) by mouth daily before breakfast. 90 capsule 4  . ondansetron (ZOFRAN-ODT) 8 MG disintegrating tablet Take 1 tablet (8 mg total) by mouth every morning. AND PRN 90 tablet 4  . thyroid (ARMOUR) 30 MG tablet Take 30 mg by mouth daily before breakfast.    . traZODone (DESYREL) 100 MG tablet Take 1 tablet (100 mg total) by mouth at bedtime. 90 tablet 0  .  TRINTELLIX 20 MG TABS Take 20 mg by mouth every morning. 90 mg 0   No current facility-administered medications for this visit.      Musculoskeletal: Strength & Muscle Tone: within normal limits Gait & Station: normal Patient leans: N/A  Psychiatric Specialty Exam: ROS  Blood pressure 110/66, pulse 83, height 5\' 2"  (1.575 m), weight 159 lb (72.1 kg).Body mass index is 29.08 kg/m.  General Appearance: Well Groomed  Eye Contact:  Good  Speech:  Clear and Coherent  Volume:  Normal  Mood:  Pleasant  Affect:  Appropriate  Thought Process:  Goal Directed  Orientation:  Full (Time, Place, and Person)  Thought Content: Logical   Suicidal Thoughts:  No  Homicidal Thoughts:  No  Memory:  Immediate;   Good Recent;   Good Remote;   Good  Judgement:  Good  Insight:  Good  Psychomotor Activity:  Normal  Concentration:  Concentration: Good and Attention Span: Good  Recall:  Good  Fund of Knowledge: Good  Language: Good  Akathisia:  No  Handed:  Right  AIMS (if indicated): not done  Assets:  Communication Skills Desire for [place] Support  ADL's:  Intact  Cognition: WNL  Sleep:  Good   Screenings: AUDIT     Admission (Discharged) from OP Visit from 07/15/2012 in [place] [facility]  Alcohol Use Disorder Identification Test Final Score (AUDIT)  1    PHQ2-9     Office Visit from 06/14/2017 in [facility]  PHQ-2 Total Score  0       Assessment and Plan: Major depressive disorder, recurrent.  Attention deficit disorder, inattentive type.  Patient doing better on her current medication.  She has no side effects.  Continue Ritalin 5 mg in the morning, trazodone 100 mg at bedtime, Lamictal 200 mg daily and Trintelix 20 mg daily.  Patient is not interested in counseling.  Recommended to call us back if she has any question or any concern.  Follow-up in 3 months   Kathlee Nations, MD 09/14/2017, 1:19 PM

## 2017-09-15 DIAGNOSIS — R262 Difficulty in walking, not elsewhere classified: Secondary | ICD-10-CM | POA: Diagnosis not present

## 2017-09-15 DIAGNOSIS — M25562 Pain in left knee: Secondary | ICD-10-CM | POA: Diagnosis not present

## 2017-09-15 DIAGNOSIS — M6281 Muscle weakness (generalized): Secondary | ICD-10-CM | POA: Diagnosis not present

## 2017-09-15 DIAGNOSIS — M545 Low back pain: Secondary | ICD-10-CM | POA: Diagnosis not present

## 2017-09-22 DIAGNOSIS — R262 Difficulty in walking, not elsewhere classified: Secondary | ICD-10-CM | POA: Diagnosis not present

## 2017-09-22 DIAGNOSIS — M6281 Muscle weakness (generalized): Secondary | ICD-10-CM | POA: Diagnosis not present

## 2017-09-22 DIAGNOSIS — H66002 Acute suppurative otitis media without spontaneous rupture of ear drum, left ear: Secondary | ICD-10-CM | POA: Diagnosis not present

## 2017-09-22 DIAGNOSIS — M25562 Pain in left knee: Secondary | ICD-10-CM | POA: Diagnosis not present

## 2017-09-22 DIAGNOSIS — M545 Low back pain: Secondary | ICD-10-CM | POA: Diagnosis not present

## 2017-10-18 ENCOUNTER — Other Ambulatory Visit: Payer: Self-pay

## 2017-10-18 ENCOUNTER — Inpatient Hospital Stay: Payer: Medicare Other | Attending: Oncology

## 2017-10-18 DIAGNOSIS — D500 Iron deficiency anemia secondary to blood loss (chronic): Secondary | ICD-10-CM

## 2017-10-18 DIAGNOSIS — C182 Malignant neoplasm of ascending colon: Secondary | ICD-10-CM

## 2017-10-18 DIAGNOSIS — D509 Iron deficiency anemia, unspecified: Secondary | ICD-10-CM

## 2017-10-18 DIAGNOSIS — Z85038 Personal history of other malignant neoplasm of large intestine: Secondary | ICD-10-CM | POA: Insufficient documentation

## 2017-10-18 LAB — CBC WITH DIFFERENTIAL/PLATELET
Basophils Absolute: 0.1 10*3/uL (ref 0–0.1)
Basophils Relative: 1 %
Eosinophils Absolute: 0 10*3/uL (ref 0–0.7)
Eosinophils Relative: 1 %
HEMATOCRIT: 40.3 % (ref 35.0–47.0)
Hemoglobin: 13.9 g/dL (ref 12.0–16.0)
LYMPHS ABS: 3.4 10*3/uL (ref 1.0–3.6)
LYMPHS PCT: 34 %
MCH: 31.3 pg (ref 26.0–34.0)
MCHC: 34.4 g/dL (ref 32.0–36.0)
MCV: 90.9 fL (ref 80.0–100.0)
MONOS PCT: 5 %
Monocytes Absolute: 0.5 10*3/uL (ref 0.2–0.9)
Neutro Abs: 6 10*3/uL (ref 1.4–6.5)
Neutrophils Relative %: 59 %
Platelets: 310 10*3/uL (ref 150–440)
RBC: 4.43 MIL/uL (ref 3.80–5.20)
RDW: 12.9 % (ref 11.5–14.5)
WBC: 9.9 10*3/uL (ref 3.6–11.0)

## 2017-10-18 LAB — COMPREHENSIVE METABOLIC PANEL
ALT: 18 U/L (ref 14–54)
AST: 16 U/L (ref 15–41)
Albumin: 4 g/dL (ref 3.5–5.0)
Alkaline Phosphatase: 73 U/L (ref 38–126)
Anion gap: 6 (ref 5–15)
BUN: 18 mg/dL (ref 6–20)
CHLORIDE: 106 mmol/L (ref 101–111)
CO2: 27 mmol/L (ref 22–32)
CREATININE: 0.87 mg/dL (ref 0.44–1.00)
Calcium: 9.5 mg/dL (ref 8.9–10.3)
Glucose, Bld: 96 mg/dL (ref 65–99)
POTASSIUM: 4.5 mmol/L (ref 3.5–5.1)
SODIUM: 139 mmol/L (ref 135–145)
Total Bilirubin: 0.6 mg/dL (ref 0.3–1.2)
Total Protein: 7.1 g/dL (ref 6.5–8.1)

## 2017-10-18 LAB — IRON AND TIBC
IRON: 76 ug/dL (ref 28–170)
SATURATION RATIOS: 21 % (ref 10.4–31.8)
TIBC: 359 ug/dL (ref 250–450)
UIBC: 283 ug/dL

## 2017-10-18 LAB — FERRITIN: FERRITIN: 22 ng/mL (ref 11–307)

## 2017-10-19 LAB — CEA: CEA: 1 ng/mL (ref 0.0–4.7)

## 2017-10-21 ENCOUNTER — Other Ambulatory Visit: Payer: Self-pay

## 2017-10-21 ENCOUNTER — Encounter: Payer: Self-pay | Admitting: Oncology

## 2017-10-21 ENCOUNTER — Inpatient Hospital Stay (HOSPITAL_BASED_OUTPATIENT_CLINIC_OR_DEPARTMENT_OTHER): Payer: Medicare Other | Admitting: Oncology

## 2017-10-21 VITALS — BP 114/63 | HR 68 | Temp 96.7°F | Resp 18 | Wt 157.3 lb

## 2017-10-21 DIAGNOSIS — D500 Iron deficiency anemia secondary to blood loss (chronic): Secondary | ICD-10-CM

## 2017-10-21 DIAGNOSIS — D509 Iron deficiency anemia, unspecified: Secondary | ICD-10-CM | POA: Diagnosis not present

## 2017-10-21 DIAGNOSIS — C182 Malignant neoplasm of ascending colon: Secondary | ICD-10-CM

## 2017-10-21 DIAGNOSIS — Z85038 Personal history of other malignant neoplasm of large intestine: Secondary | ICD-10-CM | POA: Diagnosis not present

## 2017-10-21 NOTE — Progress Notes (Signed)
Patient here today for follow up. No concerns voiced.  °

## 2017-10-23 NOTE — Progress Notes (Signed)
Hematology/Oncology Follow Up Note [facility] [phone] [phone] Patient Care Team: Lavera Guise, MD as PCP - General (Internal Medicine) Manya Silvas, MD (Gastroenterology) Bary Castilla Forest Gleason, MD (General Surgery) Clent Jacks, RN as Registered Nurse  REFERRING PROVIDER: Dr.Byrnett Dellis Filbert  REASON FOR VISIT Follow up for treatment of management of colon cancer and iron deficiency anemia.  HISTORY OF PRESENTING ILLNESS:  [name] is a @ 72 y.o.  female with PMH listed below who was referred to me for evaluation and management of colon cancer.  a colonoscopy done on 12/25/2016 by Dr. Vira Agar. Moves her bowels daily. Patient states she is nausea, states it started at the end of June .She states she was having pain in her left upper quadrant pain was seen in the ER on 12/24/2016 Upper and lower endoscopies dated 12/28/2016 reviewed which showed Cecal mass, biopsy-proven adenocarcinoma. Patient underwent laparoscopically assisted right hemicolectomy with ileotransverse colostomy. Pathology revealed pT3N0 adenocarcinoma, with perineural invasion and lymphvascular invasion  #  Cancer Stage: pT3N0cM0, stage colon cancer. She has decided not to take adjuvant chemotherapy.  Interval History Patient presents for follow-up of Imagent of colon cancer and iron deficiency anemia. Reports feeling well. Appetite is good. Denies any melena, blood in the stool.  Chronic nausea, no vomiting. Stable. Not better or worse.      Review of Systems  Constitutional: Negative for chills, fever, malaise/fatigue and weight loss.  HENT: Negative for congestion, ear discharge, ear pain, hearing loss, nosebleeds, sinus pain, sore throat and tinnitus.   Eyes: Negative for blurred vision, double vision, photophobia, pain, discharge and redness.  Respiratory: Negative for cough, hemoptysis, sputum production, shortness of breath and wheezing.     Cardiovascular: Negative for chest pain, palpitations, orthopnea, claudication and leg swelling.  Gastrointestinal: Positive for nausea. Negative for abdominal pain, blood in stool, constipation, diarrhea, heartburn, melena and vomiting.  Genitourinary: Negative for dysuria, flank pain, frequency and hematuria.  Musculoskeletal: Negative for back pain, myalgias and neck pain.  Skin: Negative for itching and rash.  Neurological: Negative for dizziness, tingling, tremors, focal weakness, weakness and headaches.  Endo/Heme/Allergies: Negative for environmental allergies. Does not bruise/bleed easily.  Psychiatric/Behavioral: Negative for depression, hallucinations and substance abuse. The patient is not nervous/anxious.      MEDICAL HISTORY:  Past Medical History:  Diagnosis Date  . Anemia   . Carcinoma ([place])   . Colon cancer ([place]) 01/2017  . Fibromyalgia   . GERD (gastroesophageal reflux disease)   . Hemorrhoids   . Hypothyroidism   . Iron deficiency anemia 03/08/2017  . Major depressive disorder, recurrent episode, mild ([place])   . PONV (postoperative nausea and vomiting)   . Sleep apnea    USES CPAP    SURGICAL HISTORY: Past Surgical History:  Procedure Laterality Date  . ABDOMINAL HYSTERECTOMY  1990  . CATARACT EXTRACTION, BILATERAL Bilateral 05/2012  . CHOLECYSTECTOMY  2001  . COLONOSCOPY WITH PROPOFOL N/A 12/28/2016   Procedure: COLONOSCOPY WITH PROPOFOL;  Surgeon: Manya Silvas, MD;  Location: [facility] ENDOSCOPY;  Service: Endoscopy;  Laterality: N/A;  . ESOPHAGOGASTRODUODENOSCOPY (EGD) WITH PROPOFOL N/A 12/28/2016   Procedure: ESOPHAGOGASTRODUODENOSCOPY (EGD) WITH PROPOFOL;  Surgeon: Manya Silvas, MD;  Location: [facility] ENDOSCOPY;  Service: Endoscopy;  Laterality: N/A;  . LAPAROSCOPIC RIGHT COLECTOMY Right 01/21/2017   Procedure: LAPAROSCOPIC ASSISTED RIGHT COLECTOMY;  Surgeon: Robert Bellow, MD;  Location: [facility] ORS;  Service: General;  Laterality: Right;    SOCIAL  HISTORY: Social History  Socioeconomic History  . Marital status: Married    Spouse name: Not on file  . Number of children: Not on file  . Years of education: Not on file  . Highest education level: Not on file  Occupational History  . Not on file  Social Needs  . Financial resource strain: Not on file  . Food insecurity:    Worry: Not on file    Inability: Not on file  . Transportation needs:    Medical: Not on file    Non-medical: Not on file  Tobacco Use  . Smoking status: Never Smoker  . Smokeless tobacco: Never Used  Substance and Sexual Activity  . Alcohol use: Yes    Alcohol/week: 0.6 oz    Types: 1 Shots of liquor per week    Comment: RARE socially  . Drug use: No  . Sexual activity: Yes    Partners: Male    Birth control/protection: None  Lifestyle  . Physical activity:    Days per week: Not on file    Minutes per session: Not on file  . Stress: Not on file  Relationships  . Social connections:    Talks on phone: Not on file    Gets together: Not on file    Attends religious service: Not on file    Active member of club or organization: Not on file    Attends meetings of clubs or organizations: Not on file    Relationship status: Not on file  . Intimate partner violence:    Fear of current or ex partner: Not on file    Emotionally abused: Not on file    Physically abused: Not on file    Forced sexual activity: Not on file  Other Topics Concern  . Not on file  Social History Narrative  . Not on file    FAMILY HISTORY: Family History  Problem Relation Age of Onset  . Depression Brother   . Alcohol abuse Brother   . Suicidality Brother   . Stroke Mother   . Clotting disorder Brother   . Colon polyps Father   . Melanoma Sister   . Bipolar disorder Neg Hx     ALLERGIES:  is allergic to other and codeine.  MEDICATIONS:  Current Outpatient Medications  Medication Sig Dispense Refill  . cetirizine (ZYRTEC) 10 MG tablet Take 10 mg by mouth  daily.    Marland Kitchen estradiol (ESTRACE) 0.5 MG tablet Take 1 tablet (0.5 mg total) by mouth daily. 90 tablet 4  . lamoTRIgine (LAMICTAL) 200 MG tablet Take 1 tablet (200 mg total) by mouth every morning. 90 tablet 0  . levothyroxine (SYNTHROID, LEVOTHROID) 88 MCG tablet Take 88 mcg by mouth daily before breakfast.   2  . methylphenidate (RITALIN) 5 MG tablet Take 1 tablet (5 mg total) by mouth daily. 90 tablet 0  . Multiple Vitamins-Minerals (MULTIVITAMIN WITH MINERALS) tablet Take 1 tablet by mouth daily.    Marland Kitchen omeprazole (PRILOSEC) 20 MG capsule Take 1 capsule (20 mg total) by mouth daily before breakfast. 90 capsule 4  . ondansetron (ZOFRAN-ODT) 8 MG disintegrating tablet Take 1 tablet (8 mg total) by mouth every morning. AND PRN 90 tablet 4  . thyroid (ARMOUR) 30 MG tablet Take 30 mg by mouth daily before breakfast.    . traZODone (DESYREL) 100 MG tablet Take 1 tablet (100 mg total) by mouth at bedtime. 90 tablet 0  . TRINTELLIX 20 MG TABS tablet Take 1 tablet (20 mg total) by  mouth every morning. 20 tablet 2   No current facility-administered medications for this visit.       Marland Kitchen  PHYSICAL EXAMINATION: ECOG PERFORMANCE STATUS: 0 - Asymptomatic Vitals:   10/21/17 1436  BP: 114/63  Pulse: 68  Resp: 18  Temp: (!) 96.7 F (35.9 C)  SpO2: 100%   Filed Weights   10/21/17 1436  Weight: 157 lb 4.8 oz (71.4 kg)   Physical Exam  Constitutional: She is oriented to person, place, and time and well-developed, well-nourished, and in no distress. No distress.  HENT:  Head: Normocephalic and atraumatic.  Mouth/Throat: Oropharynx is clear and moist. No oropharyngeal exudate.  Eyes: Pupils are equal, round, and reactive to light. Conjunctivae and EOM are normal. No scleral icterus.  Neck: Normal range of motion. Neck supple. No JVD present.  Cardiovascular: Normal rate, regular rhythm and normal heart sounds.  No murmur heard. Pulmonary/Chest: Breath sounds normal. No respiratory distress. She has  no rales.  Abdominal: Soft. Bowel sounds are normal. She exhibits no distension. There is no rebound.  Musculoskeletal: Normal range of motion. She exhibits no edema or tenderness.  Lymphadenopathy:    She has no cervical adenopathy.  Neurological: She is alert and oriented to person, place, and time. She displays normal reflexes. No cranial nerve deficit.  Skin: Skin is warm and dry. No rash noted. No erythema.  Psychiatric: Memory, affect and judgment normal.      LABORATORY DATA:  I have reviewed the data as listed Lab Results  Component Value Date   WBC 9.9 10/18/2017   HGB 13.9 10/18/2017   HCT 40.3 10/18/2017   MCV 90.9 10/18/2017   PLT 310 10/18/2017   Recent Labs    04/14/17 1040 07/13/17 1112 07/14/17 1510 10/18/17 1134  NA 137  --  138 139  K 3.8  --  3.9 4.5  CL 102  --  103 106  CO2 28  --  24 27  GLUCOSE 112*  --  102* 96  BUN 16  --  15 18  CREATININE 0.98 0.90 0.94 0.87  CALCIUM 9.1  --  9.2 9.5  GFRNONAA 58*  --  >60 >60  GFRAA >60  --  >60 >60  PROT 6.9  --  6.8 7.1  ALBUMIN 4.1  --  3.8 4.0  AST 22  --  19 16  ALT 21  --  17 18  ALKPHOS 83  --  62 73  BILITOT 0.5  --  0.5 0.6    RADIOGRAPHIC STUDIES: I have personally reviewed the radiological images as listed and agreed with the findings in the report. CT abdomen pelvis with contrast.  IMPRESSION: 1. Negative for a bowel obstruction. 2. Irregular wall thickening of the cecum and ileocecal region presumably corresponding to the patient's history of colon cancer 3. Sigmoid colon diverticular disease without acute inflammation 4. Subcentimeter hypodense lesion in the left hepatic lobe too small to further characterize  CT chest 02/26/2017 IMPRESSION: No evidence of metastatic disease in the chest. Subcentimeter hypodense lesion in the lateral segment left liver lobe, too small to characterize, for which 2 month stability has been demonstrated, probably benign.  Pathology Surgical  Pathology  DIAGNOSIS:  A. RIGHT COLON; LAPAROSCOPIC-ASSISTED RIGHT COLECTOMY:  - INVASIVE ADENOCARCINOMA, MODERATELY DIFFERENTIATED, 4.6 CM.  - RESECTION MARGINS ARE NEGATIVE.  - POLYP WITH FEATURES OF A SESSILE SERRATED ADENOMA, 0.6 CM.  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  - NO TUMOR SEEN IN FOURTEEN LYMPH NODES (  0/14).  - SEE SUMMARY BELOW.   COLON AND RECTUM:  Procedure: Right colectomy  Tumor Site: Ileocecal valve  Tumor Size: Greatest dimension: 4.6 cm  Macroscopic Tumor Perforation: Not specified  Histologic Type: Adenocarcinoma  Histologic Grade: G2; Moderately differentiated  Tumor Extension: Tumor invades through muscularis propria into  pericolorectal tissue  Margins: All margins uninvolved by invasive carcinoma, high-grade  dysplasia, intramucosal adenocarcinoma, and adenoma  Treatment Effect: No known presurgical therapy  Lymphovascular Invasion: Present  Perineural Invasion: Present  Tumor Deposits: Not identified  Regional Lymph Nodes: # examined: 14,  # involved: 0  Pathologic Stage Classification (pTNM, AJCC 8th Edition): pT3 pN0  TNM Descriptors: Not applicable    ASSESSMENT & PLAN:  1. Cancer of right colon ([place])   2. Iron deficiency anemia due to chronic blood loss    Doing well. CEA stable.  Recommend CT scan every 6 months x 2 years followed by annually for the next 3 years.  Repeat CT in September.   Iron deficiency anemia: hemoglobin stable. Iron panel reviewed. Acceptable ferritin level, slightly lower than  6 months ago.   All questions were answered. The patient knows to call the clinic with any problems questions or concerns.  Return of visit: 3 months.     Earlie Server, MD, PhD Hematology Oncology [facility] Pager- [phone] 10/23/2017

## 2017-11-10 DIAGNOSIS — Z85038 Personal history of other malignant neoplasm of large intestine: Secondary | ICD-10-CM | POA: Diagnosis not present

## 2017-11-10 DIAGNOSIS — K449 Diaphragmatic hernia without obstruction or gangrene: Secondary | ICD-10-CM | POA: Diagnosis not present

## 2017-11-10 DIAGNOSIS — K219 Gastro-esophageal reflux disease without esophagitis: Secondary | ICD-10-CM | POA: Diagnosis not present

## 2017-11-10 DIAGNOSIS — R110 Nausea: Secondary | ICD-10-CM | POA: Diagnosis not present

## 2017-11-11 ENCOUNTER — Ambulatory Visit (INDEPENDENT_AMBULATORY_CARE_PROVIDER_SITE_OTHER): Payer: Medicare Other | Admitting: Adult Health

## 2017-11-11 ENCOUNTER — Encounter: Payer: Self-pay | Admitting: Adult Health

## 2017-11-11 VITALS — BP 130/70 | HR 84 | Temp 99.0°F | Resp 16 | Ht 62.0 in | Wt 157.0 lb

## 2017-11-11 DIAGNOSIS — J0100 Acute maxillary sinusitis, unspecified: Secondary | ICD-10-CM

## 2017-11-11 DIAGNOSIS — J302 Other seasonal allergic rhinitis: Secondary | ICD-10-CM

## 2017-11-11 NOTE — Progress Notes (Signed)
[facility], [place] [address]  Internal MEDICINE  Office Visit Note  Patient Name: [name]  [id number]  [id number]  Date of Service: 11/11/2017  Chief Complaint  Patient presents with  . Otitis Media    both   . Sinusitis  . Cough    HPI Pt reports she went on vacation approximatley 2 months ago and was swimming everyday.  When she returned she was treated for ear infection by urgent care with Augmentin.  She states it got better, but never fully went away. She now reports sinus pain/pressure and post nasal drip.  She has been taking sudafed and benadryl, and reports she has some seasonal allergies.    Current Medication: Outpatient Encounter Medications as of 11/11/2017  Medication Sig Note  . cetirizine (ZYRTEC) 10 MG tablet Take 10 mg by mouth daily. 10/21/2017: Taking as needed  . estradiol (ESTRACE) 0.5 MG tablet Take 1 tablet (0.5 mg total) by mouth daily.   Marland Kitchen lamoTRIgine (LAMICTAL) 200 MG tablet Take 1 tablet (200 mg total) by mouth every morning.   Marland Kitchen levothyroxine (SYNTHROID, LEVOTHROID) 88 MCG tablet Take 88 mcg by mouth daily before breakfast.    . methylphenidate (RITALIN) 5 MG tablet Take 1 tablet (5 mg total) by mouth daily.   . Multiple Vitamins-Minerals (MULTIVITAMIN WITH MINERALS) tablet Take 1 tablet by mouth daily.   Marland Kitchen omeprazole (PRILOSEC) 20 MG capsule Take 1 capsule (20 mg total) by mouth daily before breakfast.   . ondansetron (ZOFRAN-ODT) 8 MG disintegrating tablet Take 1 tablet (8 mg total) by mouth every morning. AND PRN   . thyroid (ARMOUR) 30 MG tablet Take 30 mg by mouth daily before breakfast.   . traZODone (DESYREL) 100 MG tablet Take 1 tablet (100 mg total) by mouth at bedtime.   . TRINTELLIX 20 MG TABS tablet Take 1 tablet (20 mg total) by mouth every morning.   . [DISCONTINUED] prochlorperazine (COMPAZINE) 10 MG tablet Take 1 tablet (10 mg total) every 6 (six) hours as needed by mouth (Nausea or  vomiting).    No facility-administered encounter medications on file as of 11/11/2017.     Surgical History: Past Surgical History:  Procedure Laterality Date  . ABDOMINAL HYSTERECTOMY  1990  . CATARACT EXTRACTION, BILATERAL Bilateral 05/2012  . CHOLECYSTECTOMY  2001  . COLONOSCOPY WITH PROPOFOL N/A 12/28/2016   Procedure: COLONOSCOPY WITH PROPOFOL;  Surgeon: Manya Silvas, MD;  Location[facility] ENDOSCOPY;  Service: Endoscopy;  Laterality: N/A;  . ESOPHAGOGASTRODUODENOSCOPY (EGD) WITH PROPOFOL N/A 12/28/2016   Procedure: ESOPHAGOGASTRODUODENOSCOPY (EGD) WITH PROPOFOL;  Surgeon: Manya Silvas, MD;  Location: [facility] ENDOSCOPY;  Service: Endoscopy;  Laterality: N/A;  . LAPAROSCOPIC RIGHT COLECTOMY Right 01/21/2017   Procedure: LAPAROSCOPIC ASSISTED RIGHT COLECTOMY;  Surgeon: Robert Bellow, MD;  Location: [facility] ORS;  Service: General;  Laterality: Right;    Medical History: Past Medical History:  Diagnosis Date  . Anemia   . Carcinoma ([place])   . Colon cancer ([place]) 01/2017  . Fibromyalgia   . GERD (gastroesophageal reflux disease)   . Hemorrhoids   . Hypothyroidism   . Iron deficiency anemia 03/08/2017  . Major depressive disorder, recurrent episode, mild ([place])   . PONV (postoperative nausea and vomiting)   . Sleep apnea    USES CPAP    Family History: Family History  Problem Relation Age of Onset  . Depression Brother   . Alcohol abuse Brother   . Suicidality Brother   . Stroke  Mother   . Clotting disorder Brother   . Colon polyps Father   . Melanoma Sister   . Bipolar disorder Neg Hx     Social History   Socioeconomic History  . Marital status: Married    Spouse name: Not on file  . Number of children: Not on file  . Years of education: Not on file  . Highest education level: Not on file  Occupational History  . Not on file  Social Needs  . Financial resource strain: Not on file  . Food insecurity:    Worry: Not on file    Inability: Not on file  .  Transportation needs:    Medical: Not on file    Non-medical: Not on file  Tobacco Use  . Smoking status: Never Smoker  . Smokeless tobacco: Never Used  Substance and Sexual Activity  . Alcohol use: Not Currently    Alcohol/week: 0.0 oz    Comment:    . Drug use: No  . Sexual activity: Yes    Partners: Male    Birth control/protection: None  Lifestyle  . Physical activity:    Days per week: Not on file    Minutes per session: Not on file  . Stress: Not on file  Relationships  . Social connections:    Talks on phone: Not on file    Gets together: Not on file    Attends religious service: Not on file    Active member of club or organization: Not on file    Attends meetings of clubs or organizations: Not on file    Relationship status: Not on file  . Intimate partner violence:    Fear of current or ex partner: Not on file    Emotionally abused: Not on file    Physically abused: Not on file    Forced sexual activity: Not on file  Other Topics Concern  . Not on file  Social History Narrative  . Not on file   Review of Systems  Constitutional: Negative.   HENT: Positive for congestion, postnasal drip, sinus pressure and sinus pain.   Eyes: Negative.   Respiratory: Positive for cough.   Cardiovascular: Negative.   Gastrointestinal: Negative.   Endocrine: Negative.   Genitourinary: Negative.   Musculoskeletal: Negative.   Allergic/Immunologic: Positive for environmental allergies.  Neurological: Negative.   Hematological: Negative.   Psychiatric/Behavioral: Negative.     Vital Signs: BP 130/70   Pulse 84   Temp 99 F (37.2 C)   Resp 16   Ht 5\' 2"  (1.575 m)   Wt 157 lb (71.2 kg)   LMP  (LMP Unknown)   SpO2 99%   BMI 28.72 kg/m    Physical Exam  Constitutional: She appears well-developed and well-nourished.  HENT:  Head: Normocephalic.  Right Ear: External ear normal.  Left Ear: External ear normal.  Mouth/Throat: No oropharyngeal exudate.  Eyes: Pupils  are equal, round, and reactive to light.  Neck: Normal range of motion.  Cardiovascular: Normal rate and normal heart sounds.  Pulmonary/Chest: Effort normal and breath sounds normal.  Nursing note and vitals reviewed.  Assessment/Plan: 1. Acute non-recurrent maxillary sinusitis Use Otc decongestant as well as, continue her OTC flonase and sudafed to relieve sinus pressure and promote drainage. Take cefdnir 300mg  Po BID  X 7 days.  Return to clinic as needed or if symptoms persist or worsen.    2. Seasonal allergies Discussed seasonal allergy regimen importance. Add Singulair 10mg  po daily.  General Counseling: aariah godette understanding of the findings of todays visit and agrees with plan of treatment. I have discussed any further diagnostic evaluation that may be needed or ordered today. We also reviewed her medications today. she has been encouraged to call the office with any questions or concerns that should arise related to todays visit.   Time spent: 25 Minutes   This patient was seen by Orson Gear AGNP-C in Collaboration with Dr Lavera Guise as a part of collaborative care agreement    Dr Lavera Guise Internal medicine

## 2017-11-15 ENCOUNTER — Telehealth: Payer: Self-pay | Admitting: Adult Health

## 2017-11-15 NOTE — Telephone Encounter (Signed)
Spoke to patient , she states that she was starting to feel better but had to discontinue the cefdnir 300mg  it was upsetting her stomach. Patient states that the fogginess behind the ear drum is back

## 2017-11-16 MED ORDER — LEVOFLOXACIN 500 MG PO TABS: 500.0000 mg | ORAL_TABLET | Freq: Every day | ORAL | 0 refills | Status: DC

## 2017-11-24 ENCOUNTER — Other Ambulatory Visit: Payer: Self-pay | Admitting: Internal Medicine

## 2017-11-24 ENCOUNTER — Ambulatory Visit (INDEPENDENT_AMBULATORY_CARE_PROVIDER_SITE_OTHER): Payer: Medicare Other | Admitting: Internal Medicine

## 2017-11-24 ENCOUNTER — Encounter: Payer: Self-pay | Admitting: Adult Health

## 2017-11-24 VITALS — BP 120/59 | HR 81 | Ht 62.0 in | Wt 154.6 lb

## 2017-11-24 DIAGNOSIS — E288 Other ovarian dysfunction: Secondary | ICD-10-CM | POA: Diagnosis not present

## 2017-11-24 DIAGNOSIS — Z0001 Encounter for general adult medical examination with abnormal findings: Secondary | ICD-10-CM | POA: Diagnosis not present

## 2017-11-24 DIAGNOSIS — E039 Hypothyroidism, unspecified: Secondary | ICD-10-CM | POA: Diagnosis not present

## 2017-11-24 DIAGNOSIS — J300 Vasomotor rhinitis: Secondary | ICD-10-CM | POA: Diagnosis not present

## 2017-11-24 DIAGNOSIS — M85851 Other specified disorders of bone density and structure, right thigh: Secondary | ICD-10-CM | POA: Diagnosis not present

## 2017-11-24 DIAGNOSIS — C182 Malignant neoplasm of ascending colon: Secondary | ICD-10-CM | POA: Diagnosis not present

## 2017-11-24 DIAGNOSIS — R300 Dysuria: Secondary | ICD-10-CM

## 2017-11-24 DIAGNOSIS — Z1231 Encounter for screening mammogram for malignant neoplasm of breast: Secondary | ICD-10-CM

## 2017-11-24 DIAGNOSIS — I700 Atherosclerosis of aorta: Secondary | ICD-10-CM

## 2017-11-24 DIAGNOSIS — Z1239 Encounter for other screening for malignant neoplasm of breast: Secondary | ICD-10-CM

## 2017-11-24 DIAGNOSIS — Z23 Encounter for immunization: Secondary | ICD-10-CM | POA: Diagnosis not present

## 2017-11-24 MED ORDER — PNEUMOCOCCAL VAC POLYVALENT 25 MCG/0.5ML IJ INJ: 0.5000 mL | INJECTION | INTRAMUSCULAR | 0 refills | Status: AC

## 2017-11-24 MED ORDER — MONTELUKAST SODIUM 10 MG PO TABS: 10.0000 mg | ORAL_TABLET | Freq: Every day | ORAL | 3 refills | Status: DC

## 2017-11-24 NOTE — Progress Notes (Signed)
[facility] [place], [place] [address]  Internal MEDICINE  Office Visit Note  Patient Name: [name]  [id number]  [id number]  Date of Service: 11/29/2017  Chief Complaint  Patient presents with  . Annual Exam    medicare annual well visit , statin therapy req   . Hypothyroidism  . Depression    seen by psych    HPI Pt is here for routine health maintenance examination. Doing well, had colon cancer but no chemotherapy last year. C/O sinus congestion and fullness in left ear, was treated with ABX, Went swimming on vacation. She is seen by psych, depression is better  Current Medication: Outpatient Encounter Medications as of 11/24/2017  Medication Sig  . estradiol (ESTRACE) 0.5 MG tablet Take 1 tablet (0.5 mg total) by mouth daily.  Marland Kitchen lamoTRIgine (LAMICTAL) 200 MG tablet Take 1 tablet (200 mg total) by mouth every morning.  Marland Kitchen levothyroxine (SYNTHROID, LEVOTHROID) 88 MCG tablet Take 88 mcg by mouth daily before breakfast.   . methylphenidate (RITALIN) 5 MG tablet Take 1 tablet (5 mg total) by mouth daily.  . Multiple Vitamins-Minerals (MULTIVITAMIN WITH MINERALS) tablet Take 1 tablet by mouth daily.  Marland Kitchen omeprazole (PRILOSEC) 20 MG capsule Take 1 capsule (20 mg total) by mouth daily before breakfast.  . ondansetron (ZOFRAN-ODT) 8 MG disintegrating tablet Take 1 tablet (8 mg total) by mouth every morning. AND PRN  . thyroid (ARMOUR) 30 MG tablet Take 30 mg by mouth daily before breakfast.  . traZODone (DESYREL) 100 MG tablet Take 1 tablet (100 mg total) by mouth at bedtime.  . TRINTELLIX 20 MG TABS tablet Take 1 tablet (20 mg total) by mouth every morning.  . [DISCONTINUED] levofloxacin (LEVAQUIN) 500 MG tablet Take 1 tablet (500 mg total) by mouth daily.  . montelukast (SINGULAIR) 10 MG tablet Take 1 tablet (10 mg total) by mouth at bedtime.  . [DISCONTINUED] prochlorperazine (COMPAZINE) 10 MG tablet Take 1 tablet (10 mg total) every 6 (six)  hours as needed by mouth (Nausea or vomiting).   No facility-administered encounter medications on file as of 11/24/2017.     Surgical History: Past Surgical History:  Procedure Laterality Date  . ABDOMINAL HYSTERECTOMY  1990  . CATARACT EXTRACTION, BILATERAL Bilateral 05/2012  . CHOLECYSTECTOMY  2001  . COLONOSCOPY WITH PROPOFOL N/A 12/28/2016   Procedure: COLONOSCOPY WITH PROPOFOL;  Surgeon: Manya Silvas, MD;  Location: [facility] ENDOSCOPY;  Service: Endoscopy;  Laterality: N/A;  . ESOPHAGOGASTRODUODENOSCOPY (EGD) WITH PROPOFOL N/A 12/28/2016   Procedure: ESOPHAGOGASTRODUODENOSCOPY (EGD) WITH PROPOFOL;  Surgeon: Manya Silvas, MD;  Location: [facility] ENDOSCOPY;  Service: Endoscopy;  Laterality: N/A;  . LAPAROSCOPIC RIGHT COLECTOMY Right 01/21/2017   Procedure: LAPAROSCOPIC ASSISTED RIGHT COLECTOMY;  Surgeon: Robert Bellow, MD;  Location: [facility] ORS;  Service: General;  Laterality: Right;    Medical History: Past Medical History:  Diagnosis Date  . Anemia   . Carcinoma ([place])   . Colon cancer ([place]) 01/2017  . Fibromyalgia   . GERD (gastroesophageal reflux disease)   . Hemorrhoids   . Hypothyroidism   . Iron deficiency anemia 03/08/2017  . Major depressive disorder, recurrent episode, mild ([place])   . PONV (postoperative nausea and vomiting)   . Sleep apnea    USES CPAP    Family History: Family History  Problem Relation Age of Onset  . Depression Brother   . Alcohol abuse Brother   . Suicidality Brother   . Stroke Mother   . Clotting disorder  Brother   . Colon polyps Father   . Melanoma Sister   . Bipolar disorder Neg Hx     Review of Systems  Constitutional: Negative for chills, diaphoresis and fatigue.  HENT: Negative for ear pain, postnasal drip and sinus pressure.   Eyes: Negative for photophobia, discharge, redness, itching and visual disturbance.  Respiratory: Negative for cough, shortness of breath and wheezing.   Cardiovascular: Negative for chest pain,  palpitations and leg swelling.  Gastrointestinal: Negative for abdominal pain, constipation, diarrhea, nausea and vomiting.  Genitourinary: Negative for dysuria and flank pain.  Musculoskeletal: Negative for arthralgias, back pain, gait problem and neck pain.  Skin: Negative for color change.  Allergic/Immunologic: Negative for environmental allergies and food allergies.  Neurological: Negative for dizziness and headaches.  Hematological: Does not bruise/bleed easily.  Psychiatric/Behavioral: Negative for agitation, behavioral problems (depression) and hallucinations.   Vital Signs: BP (!) 120/59   Pulse 81   Ht '5\' 2"'  (1.575 m)   Wt 154 lb 9.6 oz (70.1 kg)   LMP  (LMP Unknown)   SpO2 99%   BMI 28.28 kg/m    Physical Exam  Constitutional: She is oriented to person, place, and time. She appears well-developed and well-nourished. No distress.  HENT:  Head: Normocephalic and atraumatic.  Mouth/Throat: Oropharynx is clear and moist. No oropharyngeal exudate.  Eyes: Pupils are equal, round, and reactive to light. EOM are normal.  Neck: Normal range of motion. Neck supple. No JVD present. No tracheal deviation present. No thyromegaly present.  Cardiovascular: Normal rate, regular rhythm and normal heart sounds. Exam reveals no gallop and no friction rub.  No murmur heard. Pulmonary/Chest: Effort normal. No respiratory distress. She has no wheezes. She has no rales. She exhibits no tenderness.  Abdominal: Soft. Bowel sounds are normal.  Musculoskeletal: Normal range of motion.  Lymphadenopathy:    She has no cervical adenopathy.  Neurological: She is alert and oriented to person, place, and time. No cranial nerve deficit.  Skin: Skin is warm and dry. She is not diaphoretic.  Psychiatric: She has a normal mood and affect. Her behavior is normal. Judgment and thought content normal.   LABS: Recent Results (from the past 2160 hour(s))  Ferritin     Status: None   Collection Time:  10/18/17 11:34 AM  Result Value Ref Range   Ferritin 22 11 - 307 ng/mL    Comment: Performed at [facility], [address] [place], [place] [address]  Iron and TIBC     Status: None   Collection Time: 10/18/17 11:34 AM  Result Value Ref Range   Iron 76 28 - 170 ug/dL   TIBC 359 250 - 450 ug/dL   Saturation Ratios 21 10.4 - 31.8 %   UIBC 283 ug/dL    Comment: Performed at [facility], [address] [place], [place] [address]  CEA     Status: None   Collection Time: 10/18/17 11:34 AM  Result Value Ref Range   CEA 1.0 0.0 - 4.7 ng/mL    Comment: (NOTE)                             Nonsmokers          <3.9                             Smokers             <  5.6 [employer] Electrochemiluminescence Immunoassay (ECLIA) Values obtained with different assay methods or kits cannot be used interchangeably.  Results cannot be interpreted as absolute evidence of the presence or absence of malignant disease. Performed At: [facility] [place], [place] [id number] Rush Farmer MD [id number] Performed at [facility], [address] [place], [place] [address]   Comprehensive metabolic panel     Status: None   Collection Time: 10/18/17 11:34 AM  Result Value Ref Range   Sodium 139 135 - 145 mmol/L   Potassium 4.5 3.5 - 5.1 mmol/L   Chloride 106 101 - 111 mmol/L   CO2 27 22 - 32 mmol/L   Glucose, Bld 96 65 - 99 mg/dL   BUN 18 6 - 20 mg/dL   Creatinine, Ser 0.87 0.44 - 1.00 mg/dL   Calcium 9.5 8.9 - 10.3 mg/dL   Total Protein 7.1 6.5 - 8.1 g/dL   Albumin 4.0 3.5 - 5.0 g/dL   AST 16 15 - 41 U/L   ALT 18 14 - 54 U/L   Alkaline Phosphatase 73 38 - 126 U/L   Total Bilirubin 0.6 0.3 - 1.2 mg/dL   GFR calc non Af Amer >60 >60 mL/min   GFR calc Af Amer >60 >60 mL/min    Comment: (NOTE) The eGFR has been calculated using the CKD EPI equation. This calculation has not been validated in all clinical situations. eGFR's  persistently <60 mL/min signify possible Chronic Kidney Disease.    Anion gap 6 5 - 15    Comment: Performed at [facility], [address] [place], [place] [address]  CBC with Differential/Platelet     Status: None   Collection Time: 10/18/17 11:34 AM  Result Value Ref Range   WBC 9.9 3.6 - 11.0 K/uL   RBC 4.43 3.80 - 5.20 MIL/uL   Hemoglobin 13.9 12.0 - 16.0 g/dL   HCT 40.3 35.0 - 47.0 %   MCV 90.9 80.0 - 100.0 fL   MCH 31.3 26.0 - 34.0 pg   MCHC 34.4 32.0 - 36.0 g/dL   RDW 12.9 11.5 - 14.5 %   Platelets 310 150 - 440 K/uL   Neutrophils Relative % 59 %   Neutro Abs 6.0 1.4 - 6.5 K/uL   Lymphocytes Relative 34 %   Lymphs Abs 3.4 1.0 - 3.6 K/uL   Monocytes Relative 5 %   Monocytes Absolute 0.5 0.2 - 0.9 K/uL   Eosinophils Relative 1 %   Eosinophils Absolute 0.0 0 - 0.7 K/uL   Basophils Relative 1 %   Basophils Absolute 0.1 0 - 0.1 K/uL    Comment: Performed at [facility], [address] [place],  [address]  UA/M w/rflx Culture, Routine     Status: Abnormal   Collection Time: 11/24/17 10:28 AM  Result Value Ref Range   Specific Gravity, UA 1.023 1.005 - 1.030   pH, UA 7.5 5.0 - 7.5   Color, UA Yellow Yellow   Appearance Ur Clear Clear   Leukocytes, UA Negative Negative   Protein, UA Negative Negative/Trace   Glucose, UA Negative Negative   Ketones, UA Trace (A) Negative   RBC, UA Negative Negative   Bilirubin, UA Negative Negative   Urobilinogen, Ur 0.2 0.2 - 1.0 mg/dL   Nitrite, UA Negative Negative   Microscopic Examination Comment     Comment: Microscopic follows if indicated.   Microscopic Examination See below:     Comment: Microscopic was indicated and was  performed.   Urinalysis Reflex Comment     Comment: This specimen will not reflex to a Urine Culture.  Microscopic Examination     Status: None   Collection Time: 11/24/17 10:28 AM  Result Value Ref Range   WBC, UA 0-5 0 - 5 /hpf   RBC, UA 0-2 0 - 2 /hpf   Epithelial Cells (non  renal) 0-10 0 - 10 /hpf   Casts None seen None seen /lpf   Mucus, UA Present Not Estab.   Bacteria, UA None seen None seen/Few  Lipid Panel With LDL/HDL Ratio     Status: None   Collection Time: 11/25/17 10:24 AM  Result Value Ref Range   Cholesterol, Total 172 100 - 199 mg/dL   Triglycerides 129 0 - 149 mg/dL   HDL 52 >39 mg/dL   VLDL Cholesterol Cal 26 5 - 40 mg/dL   LDL Calculated 94 0 - 99 mg/dL   LDl/HDL Ratio 1.8 0.0 - 3.2 ratio    Comment:                                     LDL/HDL Ratio                                             Men  Women                               1/2 Avg.Risk  1.0    1.5                                   Avg.Risk  3.6    3.2                                2X Avg.Risk  6.2    5.0                                3X Avg.Risk  8.0    6.1   TSH + free T4     Status: Abnormal   Collection Time: 11/25/17 10:24 AM  Result Value Ref Range   TSH <0.006 (L) 0.450 - 4.500 uIU/mL   Free T4 1.55 0.82 - 1.77 ng/dL   Assessment/Plan: 1. Encounter for general adult medical examination with abnormal findings - Lipid profile is normal, no need for statins, pt is losing weight - Lipid Panel With LDL/HDL Ratio - TSH + free T4  2. Hypothyroidism, unspecified type - continue Synthroid as before  - TSH + free T4  3. Cancer of right colon [facility]) - Per Oncology and GI  4. Screening breast examination - Mammogram   5. Other ovarian dysfunction - DG Bone Density; Future  6. Vasomotor rhinitis - Start Singulair - montelukast (SINGULAIR) 10 MG tablet; Take 1 tablet (10 mg total) by mouth at bedtime. General Counseling: demari kropp understanding of the findings of todays visit and agrees with plan of treatment. I have discussed any further diagnostic evaluation that may be needed or ordered today. We also reviewed her medications today.  she has been encouraged to call the office with any questions or concerns that should arise related to todays  visit.  Counseling: Cardiac risk factor modification:  1. Control blood pressure. 2. Exercise as prescribed. 3. Follow low sodium, low fat diet. and low fat and low cholestrol diet. 4. Take ASA 82m once a day. 5. Restricted calories diet to lose weight.  Orders Placed This Encounter  Procedures  . Microscopic Examination  . DG Bone Density  . UA/M w/rflx Culture, Routine  . Lipid Panel With LDL/HDL Ratio  . TSH + free T4    Meds ordered this encounter  Medications  . montelukast (SINGULAIR) 10 MG tablet    Sig: Take 1 tablet (10 mg total) by mouth at bedtime.    Dispense:  30 tablet    Refill:  3    Time spent:25 [place] MD  Internal Medicine

## 2017-11-25 DIAGNOSIS — E039 Hypothyroidism, unspecified: Secondary | ICD-10-CM | POA: Diagnosis not present

## 2017-11-25 DIAGNOSIS — Z0001 Encounter for general adult medical examination with abnormal findings: Secondary | ICD-10-CM | POA: Diagnosis not present

## 2017-11-25 LAB — MICROSCOPIC EXAMINATION
BACTERIA UA: NONE SEEN
Casts: NONE SEEN /lpf

## 2017-11-25 LAB — UA/M W/RFLX CULTURE, ROUTINE
BILIRUBIN UA: NEGATIVE
GLUCOSE, UA: NEGATIVE
LEUKOCYTES UA: NEGATIVE
Nitrite, UA: NEGATIVE
PROTEIN UA: NEGATIVE
RBC, UA: NEGATIVE
SPEC GRAV UA: 1.023 (ref 1.005–1.030)
UUROB: 0.2 mg/dL (ref 0.2–1.0)
pH, UA: 7.5 (ref 5.0–7.5)

## 2017-11-26 LAB — LIPID PANEL WITH LDL/HDL RATIO
CHOLESTEROL TOTAL: 172 mg/dL (ref 100–199)
HDL: 52 mg/dL (ref >39)
LDL Calculated: 94 mg/dL (ref 0–99)
LDl/HDL Ratio: 1.8 ratio (ref 0.0–3.2)
Triglycerides: 129 mg/dL (ref 0–149)
VLDL Cholesterol Cal: 26 mg/dL (ref 5–40)

## 2017-11-26 LAB — TSH+FREE T4
Free T4: 1.55 ng/dL (ref 0.82–1.77)
TSH: <0.006 u[IU]/mL — ABNORMAL LOW (ref 0.450–4.500)

## 2017-11-30 DIAGNOSIS — D485 Neoplasm of uncertain behavior of skin: Secondary | ICD-10-CM | POA: Diagnosis not present

## 2017-11-30 DIAGNOSIS — D0461 Carcinoma in situ of skin of right upper limb, including shoulder: Secondary | ICD-10-CM | POA: Diagnosis not present

## 2017-12-06 DIAGNOSIS — D0461 Carcinoma in situ of skin of right upper limb, including shoulder: Secondary | ICD-10-CM | POA: Diagnosis not present

## 2017-12-07 DIAGNOSIS — H2511 Age-related nuclear cataract, right eye: Secondary | ICD-10-CM | POA: Diagnosis not present

## 2017-12-08 ENCOUNTER — Telehealth: Payer: Self-pay | Admitting: Internal Medicine

## 2017-12-13 ENCOUNTER — Other Ambulatory Visit: Payer: Self-pay | Admitting: Internal Medicine

## 2017-12-13 DIAGNOSIS — I7090 Unspecified atherosclerosis: Secondary | ICD-10-CM

## 2017-12-13 NOTE — Progress Notes (Incomplete)
[facility] [place], [place] [address]  Internal MEDICINE  Office Visit Note  Patient Name: [name]  [id number]  [id number]  Date of Service: 11/29/2017  Chief Complaint  Patient presents with  . Annual Exam    medicare annual well visit , statin therapy req   . Hypothyroidism  . Depression    seen by psych    HPI Pt is here for routine health maintenance examination. Doing well, had colon cancer but no chemotherapy last year. C/O sinus congestion and fullness in left ear, was treated with ABX, Went swimming on vacation. She is seen by psych, depression is better. Abdominal CT scan was reviewed ( M1. No findings of recurrent malignancy.  Prior right hemicolectomy. 2. 8 mm hypodense lesion in segment 3 of the liver is likely benign and appears stable. 3. Other imaging findings of potential clinical significance: Aortic Atherosclerosis (ICD10-I70.0). Sigmoid colon diverticulosis. Mild lumbar spondylosis and degenerative disc disease.  Current Medication: Outpatient Encounter Medications as of 11/24/2017  Medication Sig  . estradiol (ESTRACE) 0.5 MG tablet Take 1 tablet (0.5 mg total) by mouth daily.  Marland Kitchen lamoTRIgine (LAMICTAL) 200 MG tablet Take 1 tablet (200 mg total) by mouth every morning.  Marland Kitchen levothyroxine (SYNTHROID, LEVOTHROID) 88 MCG tablet Take 88 mcg by mouth daily before breakfast.   . methylphenidate (RITALIN) 5 MG tablet Take 1 tablet (5 mg total) by mouth daily.  . Multiple Vitamins-Minerals (MULTIVITAMIN WITH MINERALS) tablet Take 1 tablet by mouth daily.  Marland Kitchen omeprazole (PRILOSEC) 20 MG capsule Take 1 capsule (20 mg total) by mouth daily before breakfast.  . ondansetron (ZOFRAN-ODT) 8 MG disintegrating tablet Take 1 tablet (8 mg total) by mouth every morning. AND PRN  . thyroid (ARMOUR) 30 MG tablet Take 30 mg by mouth daily before breakfast.  . traZODone (DESYREL) 100 MG tablet Take 1 tablet (100 mg total) by mouth at bedtime.   . TRINTELLIX 20 MG TABS tablet Take 1 tablet (20 mg total) by mouth every morning.  . [DISCONTINUED] levofloxacin (LEVAQUIN) 500 MG tablet Take 1 tablet (500 mg total) by mouth daily.  . montelukast (SINGULAIR) 10 MG tablet Take 1 tablet (10 mg total) by mouth at bedtime.  . [DISCONTINUED] prochlorperazine (COMPAZINE) 10 MG tablet Take 1 tablet (10 mg total) every 6 (six) hours as needed by mouth (Nausea or vomiting).   No facility-administered encounter medications on file as of 11/24/2017.     Surgical History: Past Surgical History:  Procedure Laterality Date  . ABDOMINAL HYSTERECTOMY  1990  . CATARACT EXTRACTION, BILATERAL Bilateral 05/2012  . CHOLECYSTECTOMY  2001  . COLONOSCOPY WITH PROPOFOL N/A 12/28/2016   Procedure: COLONOSCOPY WITH PROPOFOL;  Surgeon: Manya Silvas, MD;  Location: [facility] ENDOSCOPY;  Service: Endoscopy;  Laterality: N/A;  . ESOPHAGOGASTRODUODENOSCOPY (EGD) WITH PROPOFOL N/A 12/28/2016   Procedure: ESOPHAGOGASTRODUODENOSCOPY (EGD) WITH PROPOFOL;  Surgeon: Manya Silvas, MD;  Location: [facility] ENDOSCOPY;  Service: Endoscopy;  Laterality: N/A;  . LAPAROSCOPIC RIGHT COLECTOMY Right 01/21/2017   Procedure: LAPAROSCOPIC ASSISTED RIGHT COLECTOMY;  Surgeon: Robert Bellow, MD;  Location: [facility] ORS;  Service: General;  Laterality: Right;    Medical History: Past Medical History:  Diagnosis Date  . Anemia   . Carcinoma ([place])   . Colon cancer ([place]) 01/2017  . Fibromyalgia   . GERD (gastroesophageal reflux disease)   . Hemorrhoids   . Hypothyroidism   . Iron deficiency anemia 03/08/2017  . Major depressive disorder, recurrent episode, mild ([place])   .  PONV (postoperative nausea and vomiting)   . Sleep apnea    USES CPAP    Family History: Family History  Problem Relation Age of Onset  . Depression Brother   . Alcohol abuse Brother   . Suicidality Brother   . Stroke Mother   . Clotting disorder Brother   . Colon polyps Father   . Melanoma Sister   .  Bipolar disorder Neg Hx     Review of Systems  Constitutional: Negative for chills, diaphoresis and fatigue.  HENT: Negative for ear pain, postnasal drip and sinus pressure.   Eyes: Negative for photophobia, discharge, redness, itching and visual disturbance.  Respiratory: Negative for cough, shortness of breath and wheezing.   Cardiovascular: Negative for chest pain, palpitations and leg swelling.  Gastrointestinal: Negative for abdominal pain, constipation, diarrhea, nausea and vomiting.  Genitourinary: Negative for dysuria and flank pain.  Musculoskeletal: Negative for arthralgias, back pain, gait problem and neck pain.  Skin: Negative for color change.  Allergic/Immunologic: Negative for environmental allergies and food allergies.  Neurological: Negative for dizziness and headaches.  Hematological: Does not bruise/bleed easily.  Psychiatric/Behavioral: Negative for agitation, behavioral problems (depression) and hallucinations.   Vital Signs: BP (!) 120/59   Pulse 81   Ht 5' 2" (1.575 m)   Wt 154 lb 9.6 oz (70.1 kg)   LMP  (LMP Unknown)   SpO2 99%   BMI 28.28 kg/m    Physical Exam  Constitutional: She is oriented to person, place, and time. She appears well-developed and well-nourished. No distress.  HENT:  Head: Normocephalic and atraumatic.  Mouth/Throat: Oropharynx is clear and moist. No oropharyngeal exudate.  Eyes: Pupils are equal, round, and reactive to light. EOM are normal.  Neck: Normal range of motion. Neck supple. No JVD present. No tracheal deviation present. No thyromegaly present.  Cardiovascular: Normal rate, regular rhythm and normal heart sounds. Exam reveals no gallop and no friction rub.  No murmur heard. Pulmonary/Chest: Effort normal. No respiratory distress. She has no wheezes. She has no rales. She exhibits no tenderness.  Abdominal: Soft. Bowel sounds are normal.  Musculoskeletal: Normal range of motion.  Lymphadenopathy:    She has no cervical  adenopathy.  Neurological: She is alert and oriented to person, place, and time. No cranial nerve deficit.  Skin: Skin is warm and dry. She is not diaphoretic.  Psychiatric: She has a normal mood and affect. Her behavior is normal. Judgment and thought content normal.   LABS: Recent Results (from the past 2160 hour(s))  Ferritin     Status: None   Collection Time: 10/18/17 11:34 AM  Result Value Ref Range   Ferritin 22 11 - 307 ng/mL    Comment: Performed at [facility], [address] [place], [place] [address]  Iron and TIBC     Status: None   Collection Time: 10/18/17 11:34 AM  Result Value Ref Range   Iron 76 28 - 170 ug/dL   TIBC 359 250 - 450 ug/dL   Saturation Ratios 21 10.4 - 31.8 %   UIBC 283 ug/dL    Comment: Performed at [facility], [address] [place], [place] [address]  CEA     Status: None   Collection Time: 10/18/17 11:34 AM  Result Value Ref Range   CEA 1.0 0.0 - 4.7 ng/mL    Comment: (NOTE)  Nonsmokers          <3.9                             Smokers             <5.6 [employer] Electrochemiluminescence Immunoassay (ECLIA) Values obtained with different assay methods or kits cannot be used interchangeably.  Results cannot be interpreted as absolute evidence of the presence or absence of malignant disease. Performed At: [facility] [place], [place] [id number] Rush Farmer MD LY:[id number] Performed at [facility], [address] [place], [place] [address]   Comprehensive metabolic panel     Status: None   Collection Time: 10/18/17 11:34 AM  Result Value Ref Range   Sodium 139 135 - 145 mmol/L   Potassium 4.5 3.5 - 5.1 mmol/L   Chloride 106 101 - 111 mmol/L   CO2 27 22 - 32 mmol/L   Glucose, Bld 96 65 - 99 mg/dL   BUN 18 6 - 20 mg/dL   Creatinine, Ser 0.87 0.44 - 1.00 mg/dL   Calcium 9.5 8.9 - 10.3 mg/dL   Total Protein 7.1 6.5 - 8.1 g/dL   Albumin 4.0 3.5  - 5.0 g/dL   AST 16 15 - 41 U/L   ALT 18 14 - 54 U/L   Alkaline Phosphatase 73 38 - 126 U/L   Total Bilirubin 0.6 0.3 - 1.2 mg/dL   GFR calc non Af Amer >60 >60 mL/min   GFR calc Af Amer >60 >60 mL/min    Comment: (NOTE) The eGFR has been calculated using the CKD EPI equation. This calculation has not been validated in all clinical situations. eGFR's persistently <60 mL/min signify possible Chronic Kidney Disease.    Anion gap 6 5 - 15    Comment: Performed at [facility], [address] [place], [place] [address]  CBC with Differential/Platelet     Status: None   Collection Time: 10/18/17 11:34 AM  Result Value Ref Range   WBC 9.9 3.6 - 11.0 K/uL   RBC 4.43 3.80 - 5.20 MIL/uL   Hemoglobin 13.9 12.0 - 16.0 g/dL   HCT 40.3 35.0 - 47.0 %   MCV 90.9 80.0 - 100.0 fL   MCH 31.3 26.0 - 34.0 pg   MCHC 34.4 32.0 - 36.0 g/dL   RDW 12.9 11.5 - 14.5 %   Platelets 310 150 - 440 K/uL   Neutrophils Relative % 59 %   Neutro Abs 6.0 1.4 - 6.5 K/uL   Lymphocytes Relative 34 %   Lymphs Abs 3.4 1.0 - 3.6 K/uL   Monocytes Relative 5 %   Monocytes Absolute 0.5 0.2 - 0.9 K/uL   Eosinophils Relative 1 %   Eosinophils Absolute 0.0 0 - 0.7 K/uL   Basophils Relative 1 %   Basophils Absolute 0.1 0 - 0.1 K/uL    Comment: Performed at [facility], [address] [place], [place] [address]  UA/M w/rflx Culture, Routine     Status: Abnormal   Collection Time: 11/24/17 10:28 AM  Result Value Ref Range   Specific Gravity, UA 1.023 1.005 - 1.030   pH, UA 7.5 5.0 - 7.5   Color, UA Yellow Yellow   Appearance Ur Clear Clear   Leukocytes, UA Negative Negative   Protein, UA Negative Negative/Trace   Glucose, UA Negative Negative   Ketones, UA Trace (A) Negative   RBC, UA Negative  Negative   Bilirubin, UA Negative Negative   Urobilinogen, Ur 0.2 0.2 - 1.0 mg/dL   Nitrite, UA Negative Negative   Microscopic Examination Comment     Comment: Microscopic follows if indicated.    Microscopic Examination See below:     Comment: Microscopic was indicated and was performed.   Urinalysis Reflex Comment     Comment: This specimen will not reflex to a Urine Culture.  Microscopic Examination     Status: None   Collection Time: 11/24/17 10:28 AM  Result Value Ref Range   WBC, UA 0-5 0 - 5 /hpf   RBC, UA 0-2 0 - 2 /hpf   Epithelial Cells (non renal) 0-10 0 - 10 /hpf   Casts None seen None seen /lpf   Mucus, UA Present Not Estab.   Bacteria, UA None seen None seen/Few  Lipid Panel With LDL/HDL Ratio     Status: None   Collection Time: 11/25/17 10:24 AM  Result Value Ref Range   Cholesterol, Total 172 100 - 199 mg/dL   Triglycerides 129 0 - 149 mg/dL   HDL 52 >39 mg/dL   VLDL Cholesterol Cal 26 5 - 40 mg/dL   LDL Calculated 94 0 - 99 mg/dL   LDl/HDL Ratio 1.8 0.0 - 3.2 ratio    Comment:                                     LDL/HDL Ratio                                             Men  Women                               1/2 Avg.Risk  1.0    1.5                                   Avg.Risk  3.6    3.2                                2X Avg.Risk  6.2    5.0                                3X Avg.Risk  8.0    6.1   TSH + free T4     Status: Abnormal   Collection Time: 11/25/17 10:24 AM  Result Value Ref Range   TSH <0.006 (L) 0.450 - 4.500 uIU/mL   Free T4 1.55 0.82 - 1.77 ng/dL   Assessment/Plan: 1. Encounter for general adult medical examination with abnormal findings - Lipid profile is normal, no need for statins, pt is losing weight - Lipid Panel With LDL/HDL Ratio - TSH + free T4  2. Hypothyroidism, unspecified type - continue Synthroid as before  - TSH + free T4  3. Cancer of right colon [facility]) - Per Oncology and GI  4. Screening breast examination - Mammogram   5. Other ovarian dysfunction - DG Bone Density; Future  6. Vasomotor rhinitis - Start Singulair -  montelukast (SINGULAIR) 10 MG tablet; Take 1 tablet (10 mg total) by mouth at bedtime.  7.  Schedule carotid dopplers to assess atherosclerosis  General Counseling: charizma gardiner understanding of the findings of todays visit and agrees with plan of treatment. I have discussed any further diagnostic evaluation that may be needed or ordered today. We also reviewed her medications today. she has been encouraged to call the office with any questions or concerns that should arise related to todays visit.  Counseling: Cardiac risk factor modification:  1. Control blood pressure. 2. Exercise as prescribed. 3. Follow low sodium, low fat diet. and low fat and low cholestrol diet. 4. Take ASA 77m once a day. 5. Restricted calories diet to lose weight.  Orders Placed This Encounter  Procedures  . Microscopic Examination  . DG Bone Density  . UA/M w/rflx Culture, Routine  . Lipid Panel With LDL/HDL Ratio  . TSH + free T4    Meds ordered this encounter  Medications  . montelukast (SINGULAIR) 10 MG tablet    Sig: Take 1 tablet (10 mg total) by mouth at bedtime.    Dispense:  30 tablet    Refill:  3    Time spent:25 MCowan MD  Internal Medicine

## 2017-12-13 NOTE — Telephone Encounter (Signed)
Spoke with Dr Humphrey Rolls and she added order in Epic for patient to have carotid scheduled, spoke with patient and scheduled ultrasound. Beth

## 2017-12-15 ENCOUNTER — Telehealth (HOSPITAL_COMMUNITY): Payer: Self-pay

## 2017-12-15 ENCOUNTER — Ambulatory Visit (HOSPITAL_COMMUNITY): Payer: Self-pay | Admitting: Psychiatry

## 2017-12-15 ENCOUNTER — Other Ambulatory Visit (HOSPITAL_COMMUNITY): Payer: Self-pay

## 2017-12-15 DIAGNOSIS — F331 Major depressive disorder, recurrent, moderate: Secondary | ICD-10-CM

## 2017-12-15 DIAGNOSIS — F900 Attention-deficit hyperactivity disorder, predominantly inattentive type: Secondary | ICD-10-CM

## 2017-12-15 MED ORDER — TRINTELLIX 20 MG PO TABS: 20.0000 mg | ORAL_TABLET | ORAL | 2 refills | Status: DC

## 2017-12-15 MED ORDER — LAMOTRIGINE 200 MG PO TABS: 200.0000 mg | ORAL_TABLET | ORAL | 0 refills | Status: DC

## 2017-12-15 MED ORDER — METHYLPHENIDATE HCL 5 MG PO TABS
5.0000 mg | ORAL_TABLET | Freq: Every day | ORAL | 0 refills | Status: DC
Start: 2017-12-15 — End: 2018-02-08

## 2017-12-15 MED ORDER — TRAZODONE HCL 100 MG PO TABS: 100.0000 mg | ORAL_TABLET | Freq: Every day | ORAL | 0 refills | Status: DC

## 2017-12-15 NOTE — Telephone Encounter (Signed)
All set!

## 2017-12-15 NOTE — Telephone Encounter (Signed)
Arfeen patient -   Patient called for refills, I refilled everything but the Ritalin, she uses [facility]. Thank you

## 2017-12-21 ENCOUNTER — Ambulatory Visit
Admission: RE | Admit: 2017-12-21 | Discharge: 2017-12-21 | Disposition: A | Discharge status: Home / Self Care | Payer: Medicare Other | Source: Ambulatory Visit | Attending: Internal Medicine | Admitting: Internal Medicine

## 2017-12-21 DIAGNOSIS — Z1231 Encounter for screening mammogram for malignant neoplasm of breast: Secondary | ICD-10-CM | POA: Diagnosis not present

## 2017-12-21 DIAGNOSIS — M8589 Other specified disorders of bone density and structure, multiple sites: Secondary | ICD-10-CM | POA: Diagnosis not present

## 2017-12-21 DIAGNOSIS — E288 Other ovarian dysfunction: Secondary | ICD-10-CM | POA: Diagnosis not present

## 2017-12-21 DIAGNOSIS — M85851 Other specified disorders of bone density and structure, right thigh: Secondary | ICD-10-CM

## 2017-12-21 DIAGNOSIS — Z1239 Encounter for other screening for malignant neoplasm of breast: Secondary | ICD-10-CM

## 2017-12-21 DIAGNOSIS — Z780 Asymptomatic menopausal state: Secondary | ICD-10-CM | POA: Diagnosis not present

## 2017-12-24 ENCOUNTER — Other Ambulatory Visit: Payer: Self-pay | Admitting: Internal Medicine

## 2017-12-24 DIAGNOSIS — R928 Other abnormal and inconclusive findings on diagnostic imaging of breast: Secondary | ICD-10-CM

## 2017-12-28 ENCOUNTER — Telehealth: Payer: Self-pay

## 2017-12-28 NOTE — Telephone Encounter (Signed)
Pt advised Bmd showed some bone loss continue taking vitamin D with calcium and discuss in detailed at next follow up

## 2017-12-29 ENCOUNTER — Other Ambulatory Visit: Payer: Self-pay | Admitting: Nurse Practitioner

## 2017-12-29 DIAGNOSIS — R928 Other abnormal and inconclusive findings on diagnostic imaging of breast: Secondary | ICD-10-CM

## 2018-01-06 ENCOUNTER — Ambulatory Visit
Admission: RE | Admit: 2018-01-06 | Discharge: 2018-01-06 | Disposition: A | Discharge status: Home / Self Care | Payer: Medicare Other | Source: Ambulatory Visit | Attending: Nurse Practitioner | Admitting: Nurse Practitioner

## 2018-01-06 DIAGNOSIS — R928 Other abnormal and inconclusive findings on diagnostic imaging of breast: Secondary | ICD-10-CM

## 2018-01-06 DIAGNOSIS — N6313 Unspecified lump in the right breast, lower outer quadrant: Secondary | ICD-10-CM | POA: Diagnosis not present

## 2018-01-07 ENCOUNTER — Ambulatory Visit (INDEPENDENT_AMBULATORY_CARE_PROVIDER_SITE_OTHER): Payer: Medicare Other

## 2018-01-07 DIAGNOSIS — I6523 Occlusion and stenosis of bilateral carotid arteries: Secondary | ICD-10-CM | POA: Diagnosis not present

## 2018-01-07 DIAGNOSIS — I7090 Unspecified atherosclerosis: Secondary | ICD-10-CM

## 2018-01-11 ENCOUNTER — Ambulatory Visit
Admission: RE | Admit: 2018-01-11 | Discharge: 2018-01-11 | Disposition: A | Discharge status: Home / Self Care | Payer: Medicare Other | Source: Ambulatory Visit | Attending: Oncology | Admitting: Oncology

## 2018-01-11 DIAGNOSIS — Z9049 Acquired absence of other specified parts of digestive tract: Secondary | ICD-10-CM | POA: Insufficient documentation

## 2018-01-11 DIAGNOSIS — C182 Malignant neoplasm of ascending colon: Secondary | ICD-10-CM | POA: Insufficient documentation

## 2018-01-11 DIAGNOSIS — C189 Malignant neoplasm of colon, unspecified: Secondary | ICD-10-CM | POA: Diagnosis not present

## 2018-01-11 LAB — POCT I-STAT CREATININE: Creatinine, Ser: 0.9 mg/dL (ref 0.44–1.00)

## 2018-01-11 MED ORDER — IOHEXOL 300 MG/ML  SOLN
100.0000 mL | Freq: Once | INTRAMUSCULAR | Status: AC | PRN
  Administered 2018-01-11: 100 mL via INTRAVENOUS

## 2018-01-13 ENCOUNTER — Ambulatory Visit: Payer: Medicare Other

## 2018-01-14 ENCOUNTER — Inpatient Hospital Stay: Payer: Medicare Other | Attending: Oncology

## 2018-01-14 ENCOUNTER — Inpatient Hospital Stay (HOSPITAL_BASED_OUTPATIENT_CLINIC_OR_DEPARTMENT_OTHER): Payer: Medicare Other | Admitting: Oncology

## 2018-01-14 ENCOUNTER — Encounter: Payer: Self-pay | Admitting: Oncology

## 2018-01-14 ENCOUNTER — Telehealth: Payer: Self-pay

## 2018-01-14 ENCOUNTER — Inpatient Hospital Stay: Payer: Medicare Other

## 2018-01-14 ENCOUNTER — Other Ambulatory Visit: Payer: Self-pay

## 2018-01-14 ENCOUNTER — Encounter: Payer: Self-pay | Admitting: *Deleted

## 2018-01-14 ENCOUNTER — Inpatient Hospital Stay: Payer: Medicare Other | Admitting: Oncology

## 2018-01-14 VITALS — BP 111/61 | HR 75 | Temp 96.8°F | Wt 153.3 lb

## 2018-01-14 DIAGNOSIS — C180 Malignant neoplasm of cecum: Secondary | ICD-10-CM | POA: Insufficient documentation

## 2018-01-14 DIAGNOSIS — C182 Malignant neoplasm of ascending colon: Secondary | ICD-10-CM

## 2018-01-14 DIAGNOSIS — D500 Iron deficiency anemia secondary to blood loss (chronic): Secondary | ICD-10-CM

## 2018-01-14 DIAGNOSIS — D509 Iron deficiency anemia, unspecified: Secondary | ICD-10-CM | POA: Insufficient documentation

## 2018-01-14 LAB — COMPREHENSIVE METABOLIC PANEL
ALBUMIN: 4.1 g/dL (ref 3.5–5.0)
ALK PHOS: 67 U/L (ref 38–126)
ALT: 17 U/L (ref 0–44)
ANION GAP: 6 (ref 5–15)
AST: 17 U/L (ref 15–41)
BILIRUBIN TOTAL: 0.6 mg/dL (ref 0.3–1.2)
BUN: 14 mg/dL (ref 8–23)
CALCIUM: 9.7 mg/dL (ref 8.9–10.3)
CO2: 29 mmol/L (ref 22–32)
Chloride: 104 mmol/L (ref 98–111)
Creatinine, Ser: 1 mg/dL (ref 0.44–1.00)
GFR calc Af Amer: >60 mL/min (ref >60)
GFR calc non Af Amer: 56 mL/min — ABNORMAL LOW (ref >60)
Glucose, Bld: 103 mg/dL — ABNORMAL HIGH (ref 70–99)
POTASSIUM: 4.2 mmol/L (ref 3.5–5.1)
Sodium: 139 mmol/L (ref 135–145)
TOTAL PROTEIN: 7 g/dL (ref 6.5–8.1)

## 2018-01-14 LAB — CBC WITH DIFFERENTIAL/PLATELET
BASOS ABS: 0 10*3/uL (ref 0–0.1)
BASOS PCT: 0 %
Eosinophils Absolute: 0 10*3/uL (ref 0–0.7)
Eosinophils Relative: 1 %
HEMATOCRIT: 39.6 % (ref 35.0–47.0)
Hemoglobin: 13.3 g/dL (ref 12.0–16.0)
LYMPHS PCT: 42 %
Lymphs Abs: 3.5 10*3/uL (ref 1.0–3.6)
MCH: 29.9 pg (ref 26.0–34.0)
MCHC: 33.6 g/dL (ref 32.0–36.0)
MCV: 89 fL (ref 80.0–100.0)
Monocytes Absolute: 0.4 10*3/uL (ref 0.2–0.9)
Monocytes Relative: 5 %
NEUTROS ABS: 4.3 10*3/uL (ref 1.4–6.5)
Neutrophils Relative %: 52 %
PLATELETS: 283 10*3/uL (ref 150–440)
RBC: 4.45 MIL/uL (ref 3.80–5.20)
RDW: 13.4 % (ref 11.5–14.5)
WBC: 8.2 10*3/uL (ref 3.6–11.0)

## 2018-01-14 NOTE — Telephone Encounter (Signed)
Pt advised carotid doppler is normal as per dfk

## 2018-01-14 NOTE — Progress Notes (Signed)
Hematology/Oncology Follow Up Note [facility] Telephone:(336) [phone] [phone]) [phone] Patient Care Team: Lavera Guise, MD as PCP - General (Internal Medicine) Manya Silvas, MD (Gastroenterology) Bary Castilla Forest Gleason, MD (General Surgery) Clent Jacks, RN as Registered Nurse  REASON FOR VISIT Follow up for treatment of management of colon cancer and iron deficiency anemia.  HISTORY OF PRESENTING ILLNESS:  [name] is a @ 72 y.o.  female with PMH listed below who was referred to me for evaluation and management of colon cancer.  a colonoscopy done on 12/25/2016 by Dr. Vira Agar. Moves her bowels daily. Patient states she is nausea, states it started at the end of June .She states she was having pain in her left upper quadrant pain was seen in the ER on 12/24/2016 Upper and lower endoscopies dated 12/28/2016 reviewed which showed Cecal mass, biopsy-proven adenocarcinoma. Patient underwent laparoscopically assisted right hemicolectomy with ileotransverse colostomy. Pathology revealed pT3N0 adenocarcinoma, with perineural invasion and lymphvascular invasion  #  Cancer Stage: pT3N0cM0, stage colon cancer. She has decided not to take adjuvant chemotherapy.  Interval History Patient presents for follow-up of colon cancer and iron deficiency anemia.  During the interval he has had surveillance CT scan done.  She is about 1 year after her hemicolectomy surgery for colon cancer treatment.     Imagent of colon cancer and iron deficiency anemia. Reports feeling well. Appetite is good. Denies any melena, blood in the stool.  Chronic nausea, no vomiting. Stable. Not better or worse.      Review of Systems  Constitutional: Negative for chills, fever, malaise/fatigue and weight loss.  HENT: Negative for congestion, ear discharge, ear pain, hearing loss, nosebleeds, sinus pain, sore throat and tinnitus.   Eyes: Negative for blurred vision, double vision,  photophobia, pain, discharge and redness.  Respiratory: Negative for cough, hemoptysis, sputum production, shortness of breath and wheezing.   Cardiovascular: Negative for chest pain, palpitations, orthopnea, claudication and leg swelling.  Gastrointestinal: Positive for nausea. Negative for abdominal pain, blood in stool, constipation, diarrhea, heartburn, melena and vomiting.  Genitourinary: Negative for dysuria, flank pain, frequency and hematuria.  Musculoskeletal: Negative for back pain, myalgias and neck pain.  Skin: Negative for itching and rash.  Neurological: Negative for dizziness, tingling, tremors, focal weakness, weakness and headaches.  Endo/Heme/Allergies: Negative for environmental allergies. Does not bruise/bleed easily.  Psychiatric/Behavioral: Negative for depression, hallucinations and substance abuse. The patient is not nervous/anxious.      MEDICAL HISTORY:  Past Medical History:  Diagnosis Date  . Anemia   . Carcinoma ([place])   . Colon cancer ([place]) 01/2017  . Fibromyalgia   . GERD (gastroesophageal reflux disease)   . Hemorrhoids   . Hypothyroidism   . Iron deficiency anemia 03/08/2017  . Major depressive disorder, recurrent episode, mild ([place])   . PONV (postoperative nausea and vomiting)   . Sleep apnea    USES CPAP    SURGICAL HISTORY: Past Surgical History:  Procedure Laterality Date  . ABDOMINAL HYSTERECTOMY  1990  . BREAST BIOPSY Left 2004   benign  . CATARACT EXTRACTION, BILATERAL Bilateral 05/2012  . CHOLECYSTECTOMY  2001  . COLONOSCOPY WITH PROPOFOL N/A 12/28/2016   Procedure: COLONOSCOPY WITH PROPOFOL;  Surgeon: Manya Silvas, MD;  Location[facility] ENDOSCOPY;  Service: Endoscopy;  Laterality: N/A;  . ESOPHAGOGASTRODUODENOSCOPY (EGD) WITH PROPOFOL N/A 12/28/2016   Procedure: ESOPHAGOGASTRODUODENOSCOPY (EGD) WITH PROPOFOL;  Surgeon: Manya Silvas, MD;  Location: [facility] ENDOSCOPY;  Service: Endoscopy;  Laterality: N/A;  .  LAPAROSCOPIC RIGHT  COLECTOMY Right 01/21/2017   Procedure: LAPAROSCOPIC ASSISTED RIGHT COLECTOMY;  Surgeon: Robert Bellow, MD;  Location: [facility] ORS;  Service: General;  Laterality: Right;    SOCIAL HISTORY: Social History   Socioeconomic History  . Marital status: Married    Spouse name: Not on file  . Number of children: Not on file  . Years of education: Not on file  . Highest education level: Not on file  Occupational History  . Not on file  Social Needs  . Financial resource strain: Not on file  . Food insecurity:    Worry: Not on file    Inability: Not on file  . Transportation needs:    Medical: Not on file    Non-medical: Not on file  Tobacco Use  . Smoking status: Never Smoker  . Smokeless tobacco: Never Used  Substance and Sexual Activity  . Alcohol use: Not Currently    Alcohol/week: 0.0 standard drinks    Comment:    . Drug use: No  . Sexual activity: Yes    Partners: Male    Birth control/protection: None  Lifestyle  . Physical activity:    Days per week: Not on file    Minutes per session: Not on file  . Stress: Not on file  Relationships  . Social connections:    Talks on phone: Not on file    Gets together: Not on file    Attends religious service: Not on file    Active member of club or organization: Not on file    Attends meetings of clubs or organizations: Not on file    Relationship status: Not on file  . Intimate partner violence:    Fear of current or ex partner: Not on file    Emotionally abused: Not on file    Physically abused: Not on file    Forced sexual activity: Not on file  Other Topics Concern  . Not on file  Social History Narrative  . Not on file    FAMILY HISTORY: Family History  Problem Relation Age of Onset  . Depression Brother   . Alcohol abuse Brother   . Suicidality Brother   . Stroke Mother   . Clotting disorder Brother   . Colon polyps Father   . Melanoma Sister   . Bipolar disorder Neg Hx     ALLERGIES:  is allergic to  other and codeine.  MEDICATIONS:  Current Outpatient Medications  Medication Sig Dispense Refill  . cetirizine (ZYRTEC) 10 MG tablet Take 10 mg by mouth daily.    Marland Kitchen estradiol (ESTRACE) 0.5 MG tablet Take 1 tablet (0.5 mg total) by mouth daily. 90 tablet 4  . lamoTRIgine (LAMICTAL) 200 MG tablet Take 1 tablet (200 mg total) by mouth every morning. 90 tablet 0  . levothyroxine (SYNTHROID, LEVOTHROID) 88 MCG tablet Take 88 mcg by mouth daily before breakfast.   2  . methylphenidate (RITALIN) 5 MG tablet Take 1 tablet (5 mg total) by mouth daily. 90 tablet 0  . montelukast (SINGULAIR) 10 MG tablet Take 1 tablet (10 mg total) by mouth at bedtime. 30 tablet 3  . Multiple Vitamins-Minerals (MULTIVITAMIN WITH MINERALS) tablet Take 1 tablet by mouth daily.    Marland Kitchen omeprazole (PRILOSEC) 20 MG capsule Take 1 capsule (20 mg total) by mouth daily before breakfast. 90 capsule 4  . ondansetron (ZOFRAN-ODT) 8 MG disintegrating tablet Take 1 tablet (8 mg total) by mouth every morning. AND PRN 90 tablet 4  .  thyroid (ARMOUR) 30 MG tablet Take 30 mg by mouth daily before breakfast.    . traZODone (DESYREL) 100 MG tablet Take 1 tablet (100 mg total) by mouth at bedtime. 90 tablet 0  . TRINTELLIX 20 MG TABS tablet Take 1 tablet (20 mg total) by mouth every morning. 30 tablet 2   No current facility-administered medications for this visit.       Marland Kitchen  PHYSICAL EXAMINATION: ECOG PERFORMANCE STATUS: 0 - Asymptomatic There were no vitals filed for this visit. There were no vitals filed for this visit. Physical Exam  Constitutional: She is oriented to person, place, and time and well-developed, well-nourished, and in no distress. No distress.  HENT:  Head: Normocephalic and atraumatic.  Mouth/Throat: Oropharynx is clear and moist. No oropharyngeal exudate.  Eyes: Pupils are equal, round, and reactive to light. Conjunctivae and EOM are normal. No scleral icterus.  Neck: Normal range of motion. Neck supple. No JVD  present.  Cardiovascular: Normal rate, regular rhythm and normal heart sounds.  No murmur heard. Pulmonary/Chest: Breath sounds normal. No respiratory distress. She has no rales.  Abdominal: Soft. Bowel sounds are normal. She exhibits no distension. There is no rebound.  Musculoskeletal: Normal range of motion. She exhibits no edema or tenderness.  Lymphadenopathy:    She has no cervical adenopathy.  Neurological: She is alert and oriented to person, place, and time. She displays normal reflexes. No cranial nerve deficit.  Skin: Skin is warm and dry. No rash noted. No erythema.  Psychiatric: Memory, affect and judgment normal.      LABORATORY DATA:  I have reviewed the data as listed Lab Results  Component Value Date   WBC 8.2 01/14/2018   HGB 13.3 01/14/2018   HCT 39.6 01/14/2018   MCV 89.0 01/14/2018   PLT 283 01/14/2018   Recent Labs    04/14/17 1040  07/14/17 1510 10/18/17 1134 01/11/18 1115  NA 137  --  138 139  --   K 3.8  --  3.9 4.5  --   CL 102  --  103 106  --   CO2 28  --  24 27  --   GLUCOSE 112*  --  102* 96  --   BUN 16  --  15 18  --   CREATININE 0.98   < > 0.94 0.87 0.90  CALCIUM 9.1  --  9.2 9.5  --   GFRNONAA 58*  --  >60 >60  --   GFRAA >60  --  >60 >60  --   PROT 6.9  --  6.8 7.1  --   ALBUMIN 4.1  --  3.8 4.0  --   AST 22  --  19 16  --   ALT 21  --  17 18  --   ALKPHOS 83  --  62 73  --   BILITOT 0.5  --  0.5 0.6  --    < > = values in this interval not displayed.    RADIOGRAPHIC STUDIES: I have personally reviewed the radiological images as listed and agreed with the findings in the report. CT abdomen pelvis with contrast.  IMPRESSION: 1. Negative for a bowel obstruction. 2. Irregular wall thickening of the cecum and ileocecal region presumably corresponding to the patient's history of colon cancer 3. Sigmoid colon diverticular disease without acute inflammation 4. Subcentimeter hypodense lesion in the left hepatic lobe too small to  further characterize  CT chest 02/26/2017 IMPRESSION: No evidence of metastatic disease  in the chest. Subcentimeter hypodense lesion in the lateral segment left liver lobe, too small to characterize, for which 2 month stability has been demonstrated, probably benign.  Pathology Surgical Pathology  DIAGNOSIS:  A. RIGHT COLON; LAPAROSCOPIC-ASSISTED RIGHT COLECTOMY:  - INVASIVE ADENOCARCINOMA, MODERATELY DIFFERENTIATED, 4.6 CM.  - RESECTION MARGINS ARE NEGATIVE.  - POLYP WITH FEATURES OF A SESSILE SERRATED ADENOMA, 0.6 CM.  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  - NO TUMOR SEEN IN FOURTEEN LYMPH NODES (0/14).  - SEE SUMMARY BELOW.   COLON AND RECTUM:  Procedure: Right colectomy  Tumor Site: Ileocecal valve  Tumor Size: Greatest dimension: 4.6 cm  Macroscopic Tumor Perforation: Not specified  Histologic Type: Adenocarcinoma  Histologic Grade: G2; Moderately differentiated  Tumor Extension: Tumor invades through muscularis propria into  pericolorectal tissue  Margins: All margins uninvolved by invasive carcinoma, high-grade  dysplasia, intramucosal adenocarcinoma, and adenoma  Treatment Effect: No known presurgical therapy  Lymphovascular Invasion: Present  Perineural Invasion: Present  Tumor Deposits: Not identified  Regional Lymph Nodes: # examined: 14,  # involved: 0  Pathologic Stage Classification (pTNM, AJCC 8th Edition): pT3 pN0  TNM Descriptors: Not applicable    ASSESSMENT & PLAN:  1. Cancer of right colon ([place])   2. Iron deficiency anemia due to chronic blood loss    # Right side colon cancer,  CEA stable.  CT was independently reviewed and discussed with patient. No signs of recurrent tumor, locoregional adenopathy or metastatic disease.  Recommend CBC, CMP, CEA Q3 months, and CT every 6 months for first 2 years.   Iron deficiency anemia: iron panel reviewed. Stable. Iron saturation is 21%. Hemoglobin stable. Continue to  monitor.   All questions were answered. The patient knows to call the clinic with any problems questions or concerns. We spent sufficient time to discuss many aspect of care, questions were answered to patient's satisfaction.  Return of visit: 6 months.  Total face to face encounter time for this patient visit was 25 min. >50% of the time was  spent in counseling and coordination of care.    Earlie Server, MD, PhD Hematology Oncology [facility] at [facility] Pager- [phone] 01/14/2018

## 2018-01-14 NOTE — Progress Notes (Signed)
Patient here today for follow up, Patient states no new concerns today

## 2018-01-15 LAB — CEA: CEA1: 1.2 ng/mL (ref 0.0–4.7)

## 2018-01-17 ENCOUNTER — Ambulatory Visit: Payer: Medicare Other | Admitting: Anesthesiology

## 2018-01-17 ENCOUNTER — Ambulatory Visit
Admission: RE | Admit: 2018-01-17 | Discharge: 2018-01-17 | Disposition: A | Discharge status: Home / Self Care | Payer: Medicare Other | Source: Ambulatory Visit | Attending: Unknown Physician Specialty | Admitting: Unknown Physician Specialty

## 2018-01-17 ENCOUNTER — Encounter: Payer: Self-pay | Admitting: *Deleted

## 2018-01-17 ENCOUNTER — Encounter
Admission: RE | Disposition: A | Discharge status: Home / Self Care | Payer: Self-pay | Source: Ambulatory Visit | Attending: Unknown Physician Specialty

## 2018-01-17 DIAGNOSIS — Z79899 Other long term (current) drug therapy: Secondary | ICD-10-CM | POA: Diagnosis not present

## 2018-01-17 DIAGNOSIS — K219 Gastro-esophageal reflux disease without esophagitis: Secondary | ICD-10-CM | POA: Diagnosis not present

## 2018-01-17 DIAGNOSIS — K5730 Diverticulosis of large intestine without perforation or abscess without bleeding: Secondary | ICD-10-CM | POA: Diagnosis not present

## 2018-01-17 DIAGNOSIS — Z08 Encounter for follow-up examination after completed treatment for malignant neoplasm: Secondary | ICD-10-CM | POA: Diagnosis not present

## 2018-01-17 DIAGNOSIS — Z85038 Personal history of other malignant neoplasm of large intestine: Secondary | ICD-10-CM | POA: Insufficient documentation

## 2018-01-17 DIAGNOSIS — E039 Hypothyroidism, unspecified: Secondary | ICD-10-CM | POA: Diagnosis not present

## 2018-01-17 DIAGNOSIS — Z79890 Hormone replacement therapy (postmenopausal): Secondary | ICD-10-CM | POA: Diagnosis not present

## 2018-01-17 DIAGNOSIS — G4730 Sleep apnea, unspecified: Secondary | ICD-10-CM | POA: Insufficient documentation

## 2018-01-17 DIAGNOSIS — Z980 Intestinal bypass and anastomosis status: Secondary | ICD-10-CM | POA: Insufficient documentation

## 2018-01-17 DIAGNOSIS — Q439 Congenital malformation of intestine, unspecified: Secondary | ICD-10-CM | POA: Insufficient documentation

## 2018-01-17 DIAGNOSIS — Z9989 Dependence on other enabling machines and devices: Secondary | ICD-10-CM | POA: Insufficient documentation

## 2018-01-17 DIAGNOSIS — K640 First degree hemorrhoids: Secondary | ICD-10-CM | POA: Diagnosis not present

## 2018-01-17 DIAGNOSIS — Z1211 Encounter for screening for malignant neoplasm of colon: Secondary | ICD-10-CM | POA: Diagnosis not present

## 2018-01-17 HISTORY — PX: COLONOSCOPY WITH PROPOFOL: SHX5780

## 2018-01-17 SURGERY — COLONOSCOPY WITH PROPOFOL
Anesthesia: General

## 2018-01-17 MED ORDER — SODIUM CHLORIDE 0.9 % IJ SOLN
INTRAMUSCULAR | Status: AC
  Filled 2018-01-17: qty 10

## 2018-01-17 MED ORDER — LIDOCAINE HCL (PF) 1 % IJ SOLN
INTRAMUSCULAR | Status: AC
  Administered 2018-01-17: 0.3 mL
  Filled 2018-01-17: qty 2

## 2018-01-17 MED ORDER — LIDOCAINE HCL (PF) 2 % IJ SOLN
INTRAMUSCULAR | Status: AC
  Filled 2018-01-17: qty 10

## 2018-01-17 MED ORDER — ONDANSETRON HCL 4 MG/2ML IJ SOLN
INTRAMUSCULAR | Status: DC | PRN
  Administered 2018-01-17: 4 mg via INTRAVENOUS

## 2018-01-17 MED ORDER — PROPOFOL 10 MG/ML IV BOLUS
INTRAVENOUS | Status: DC | PRN
  Administered 2018-01-17: 30 mg via INTRAVENOUS

## 2018-01-17 MED ORDER — FENTANYL CITRATE (PF) 100 MCG/2ML IJ SOLN
INTRAMUSCULAR | Status: AC
  Filled 2018-01-17: qty 2

## 2018-01-17 MED ORDER — PROPOFOL 500 MG/50ML IV EMUL
INTRAVENOUS | Status: AC
  Filled 2018-01-17: qty 50

## 2018-01-17 MED ORDER — PROPOFOL 500 MG/50ML IV EMUL
INTRAVENOUS | Status: DC | PRN
  Administered 2018-01-17: 50 ug/kg/min via INTRAVENOUS

## 2018-01-17 MED ORDER — LIDOCAINE HCL (PF) 2 % IJ SOLN
INTRAMUSCULAR | Status: DC | PRN
  Administered 2018-01-17: 60 mg

## 2018-01-17 MED ORDER — SODIUM CHLORIDE 0.9 % IV SOLN
INTRAVENOUS | Status: DC
  Administered 2018-01-17: 1000 mL via INTRAVENOUS

## 2018-01-17 MED ORDER — SODIUM CHLORIDE 0.9 % IV SOLN: INTRAVENOUS | Status: DC

## 2018-01-17 MED ORDER — FENTANYL CITRATE (PF) 100 MCG/2ML IJ SOLN
INTRAMUSCULAR | Status: DC | PRN
  Administered 2018-01-17: 25 ug via INTRAVENOUS
  Administered 2018-01-17: 50 ug via INTRAVENOUS
  Administered 2018-01-17: 25 ug via INTRAVENOUS

## 2018-01-17 MED ORDER — EPHEDRINE SULFATE 50 MG/ML IJ SOLN
INTRAMUSCULAR | Status: AC
  Filled 2018-01-17: qty 1

## 2018-01-17 MED ORDER — MIDAZOLAM HCL 5 MG/5ML IJ SOLN
INTRAMUSCULAR | Status: DC | PRN
  Administered 2018-01-17: 2 mg via INTRAVENOUS

## 2018-01-17 MED ORDER — MIDAZOLAM HCL 2 MG/2ML IJ SOLN
INTRAMUSCULAR | Status: AC
  Filled 2018-01-17: qty 2

## 2018-01-17 MED ORDER — ONDANSETRON HCL 4 MG/2ML IJ SOLN
INTRAMUSCULAR | Status: AC
  Filled 2018-01-17: qty 2

## 2018-01-17 NOTE — Transfer of Care (Signed)
Immediate Anesthesia Transfer of Care Note  Patient: [name]  Procedure(s) Performed: COLONOSCOPY WITH PROPOFOL (N/A )  Patient Location: PACU  Anesthesia Type:General  Level of Consciousness: sedated  Airway & Oxygen Therapy: Patient Spontanous Breathing and Patient connected to nasal cannula oxygen  Post-op Assessment: Report given to RN and Post -op Vital signs reviewed and stable  Post vital signs: Reviewed and stable  Last Vitals:  Vitals Value Taken Time  BP    Temp    Pulse    Resp    SpO2      Last Pain:  Vitals:   01/17/18 1037  TempSrc: Tympanic         Complications: No apparent anesthesia complications

## 2018-01-17 NOTE — Anesthesia Postprocedure Evaluation (Signed)
Anesthesia Post Note  Patient: [name]  Procedure(s) Performed: COLONOSCOPY WITH PROPOFOL (N/A )  Patient location during evaluation: Endoscopy Anesthesia Type: General Level of consciousness: awake and alert Pain management: pain level controlled Vital Signs Assessment: post-procedure vital signs reviewed and stable Respiratory status: spontaneous breathing, nonlabored ventilation, respiratory function stable and patient connected to nasal cannula oxygen Cardiovascular status: blood pressure returned to baseline and stable Postop Assessment: no apparent nausea or vomiting Anesthetic complications: no     Last Vitals:  Vitals:   01/17/18 1146 01/17/18 1156  BP: 110/65 118/62  Pulse: 76 69  Resp: 16 20  Temp:    SpO2: 100% 100%    Last Pain:  Vitals:   01/17/18 1156  TempSrc:   PainSc: 0-No pain                 Erminia Mcnew S

## 2018-01-17 NOTE — H&P (Signed)
Primary Care Physician:  Lavera Guise, MD Primary Gastroenterologist:  Dr. Vira Agar  Pre-Procedure History & Physical: HPI:  [name] is a 72 y.o. female is here for an colonoscopy.This is for personal history of colon cancer which was in the cecum and operated on last year.   Past Medical History:  Diagnosis Date  . Anemia   . Carcinoma ([place])   . Colon cancer ([place]) 01/2017  . Fibromyalgia   . GERD (gastroesophageal reflux disease)   . Hemorrhoids   . Hypothyroidism   . Iron deficiency anemia 03/08/2017  . Major depressive disorder, recurrent episode, mild ([place])   . PONV (postoperative nausea and vomiting)   . Sleep apnea    USES CPAP    Past Surgical History:  Procedure Laterality Date  . ABDOMINAL HYSTERECTOMY  1990  . BREAST BIOPSY Left 2004   benign  . CATARACT EXTRACTION, BILATERAL Bilateral 05/2012  . CHOLECYSTECTOMY  2001  . COLONOSCOPY WITH PROPOFOL N/A 12/28/2016   Procedure: COLONOSCOPY WITH PROPOFOL;  Surgeon: Manya Silvas, MD;  Location: [facility] ENDOSCOPY;  Service: Endoscopy;  Laterality: N/A;  . ESOPHAGOGASTRODUODENOSCOPY (EGD) WITH PROPOFOL N/A 12/28/2016   Procedure: ESOPHAGOGASTRODUODENOSCOPY (EGD) WITH PROPOFOL;  Surgeon: Manya Silvas, MD;  Location: [facility] ENDOSCOPY;  Service: Endoscopy;  Laterality: N/A;  . LAPAROSCOPIC RIGHT COLECTOMY Right 01/21/2017   Procedure: LAPAROSCOPIC ASSISTED RIGHT COLECTOMY;  Surgeon: Robert Bellow, MD;  Location: [facility] ORS;  Service: General;  Laterality: Right;    Prior to Admission medications   Medication Sig Start Date End Date Taking? Authorizing Provider  cetirizine (ZYRTEC) 10 MG tablet Take 10 mg by mouth daily as needed.    Yes [provider]  estradiol (ESTRACE) 0.5 MG tablet Take 1 tablet (0.5 mg total) by mouth daily. 06/14/17   Ronnell Freshwater, NP  lamoTRIgine (LAMICTAL) 200 MG tablet Take 1 tablet (200 mg total) by mouth every morning. 12/15/17   Arfeen, Arlyce Harman, MD   levothyroxine (SYNTHROID, LEVOTHROID) 88 MCG tablet Take 88 mcg by mouth daily before breakfast.  12/11/14   [provider]  methylphenidate (RITALIN) 5 MG tablet Take 1 tablet (5 mg total) by mouth daily. 12/15/17 03/15/18  Aundra Dubin, MD  montelukast (SINGULAIR) 10 MG tablet Take 1 tablet (10 mg total) by mouth at bedtime. 11/24/17   Lavera Guise, MD  Multiple Vitamins-Minerals (MULTIVITAMIN WITH MINERALS) tablet Take 1 tablet by mouth daily.    [provider]  omeprazole (PRILOSEC) 20 MG capsule Take 1 capsule (20 mg total) by mouth daily before breakfast. 06/14/17   Ronnell Freshwater, NP  ondansetron (ZOFRAN-ODT) 8 MG disintegrating tablet Take 1 tablet (8 mg total) by mouth every morning. AND PRN 06/14/17   Ronnell Freshwater, NP  thyroid (ARMOUR) 30 MG tablet Take 30 mg by mouth daily before breakfast.    [provider]  traZODone (DESYREL) 100 MG tablet Take 1 tablet (100 mg total) by mouth at bedtime. 12/15/17   Arfeen, Arlyce Harman, MD  TRINTELLIX 20 MG TABS tablet Take 1 tablet (20 mg total) by mouth every morning. 12/15/17   Arfeen, Arlyce Harman, MD  prochlorperazine (COMPAZINE) 10 MG tablet Take 1 tablet (10 mg total) every 6 (six) hours as needed by mouth (Nausea or vomiting). 03/08/17 03/08/17  Earlie Server, MD    Allergies as of 12/13/2017 - Review Complete 11/24/2017  Allergen Reaction Noted  . Other  01/14/2017  . Codeine  07/15/2012    Family History  Problem Relation Age of Onset  . Depression Brother   . Alcohol abuse Brother   . Suicidality Brother   . Stroke Mother   . Clotting disorder Brother   . Colon polyps Father   . Melanoma Sister   . Bipolar disorder Neg Hx     Social History   Socioeconomic History  . Marital status: Married    Spouse name: Not on file  . Number of children: Not on file  . Years of education: Not on file  . Highest education level: Not on file  Occupational History  . Not on file  Social Needs  . Financial  resource strain: Not on file  . Food insecurity:    Worry: Not on file    Inability: Not on file  . Transportation needs:    Medical: Not on file    Non-medical: Not on file  Tobacco Use  . Smoking status: Never Smoker  . Smokeless tobacco: Never Used  Substance and Sexual Activity  . Alcohol use: Not Currently    Alcohol/week: 0.0 standard drinks    Comment:    . Drug use: No  . Sexual activity: Yes    Partners: Male    Birth control/protection: None  Lifestyle  . Physical activity:    Days per week: Not on file    Minutes per session: Not on file  . Stress: Not on file  Relationships  . Social connections:    Talks on phone: Not on file    Gets together: Not on file    Attends religious service: Not on file    Active member of club or organization: Not on file    Attends meetings of clubs or organizations: Not on file    Relationship status: Not on file  . Intimate partner violence:    Fear of current or ex partner: Not on file    Emotionally abused: Not on file    Physically abused: Not on file    Forced sexual activity: Not on file  Other Topics Concern  . Not on file  Social History Narrative  . Not on file    Review of Systems: See HPI, otherwise negative ROS  Physical Exam: BP (!) 105/51   Pulse 88   Temp (!) 96.5 F (35.8 C) (Tympanic)   Resp 17   Ht 5\' 2"  (1.575 m)   Wt 68 kg   LMP  (LMP Unknown)   SpO2 100%   BMI 27.44 kg/m  General:   Alert,  pleasant and cooperative in NAD Head:  Normocephalic and atraumatic. Neck:  Supple; no masses or thyromegaly. Lungs:  Clear throughout to auscultation.    Heart:  Regular rate and rhythm. Abdomen:  Soft, nontender and nondistended. Normal bowel sounds, without guarding, and without rebound.   Neurologic:  Alert and  oriented x4;  grossly normal neurologically.  Impression/Plan: [name] is here for an colonoscopy to be performed for personal history of cecal colon cancer.  Risks,  benefits, limitations, and alternatives regarding  colonoscopy have been reviewed with the patient.  Questions have been answered.  All parties agreeable.   Gaylyn Cheers, MD  01/17/2018, 11:08 AM

## 2018-01-17 NOTE — Anesthesia Preprocedure Evaluation (Signed)
Anesthesia Evaluation  Patient identified by MRN, date of birth, ID band Patient awake    Reviewed: Allergy & Precautions, NPO status , Patient's Chart, lab work & pertinent test results, reviewed documented beta blocker date and time   History of Anesthesia Complications (+) PONV and history of anesthetic complications  Airway Mallampati: II  TM Distance: >3 FB     Dental  (+) Chipped   Pulmonary sleep apnea ,           Cardiovascular      Neuro/Psych PSYCHIATRIC DISORDERS Depression  Neuromuscular disease    GI/Hepatic GERD  ,  Endo/Other  Hypothyroidism   Renal/GU      Musculoskeletal  (+) Fibromyalgia -  Abdominal   Peds  Hematology  (+) anemia ,   Anesthesia Other Findings   Reproductive/Obstetrics                             Anesthesia Physical Anesthesia Plan  ASA: III  Anesthesia Plan: General   Post-op Pain Management:    Induction: Intravenous  PONV Risk Score and Plan:   Airway Management Planned:   Additional Equipment:   Intra-op Plan:   Post-operative Plan:   Informed Consent: I have reviewed the patients History and Physical, chart, labs and discussed the procedure including the risks, benefits and alternatives for the proposed anesthesia with the patient or authorized representative who has indicated his/her understanding and acceptance.     Plan Discussed with: CRNA  Anesthesia Plan Comments:         Anesthesia Quick Evaluation

## 2018-01-17 NOTE — Op Note (Signed)
[facility] Gastroenterology Patient Name: [name] Procedure Date: 01/17/2018 11:05 AM MRN: [id number] Account #: [id number] Date of Birth: [birth year] Admit Type: Outpatient Age: 72 Room: [facility] ENDO ROOM 3 Gender: Female Note Status: Finalized Procedure:            Colonoscopy Indications:          High risk colon cancer surveillance: Personal history                        of colon cancer Providers:            Manya Silvas, MD Referring MD:         Lavera Guise, MD (Referring MD) Medicines:            Propofol per Anesthesia Complications:        No immediate complications. Procedure:            Pre-Anesthesia Assessment:                       - After reviewing the risks and benefits, the patient                        was deemed in satisfactory condition to undergo the                        procedure.                       After obtaining informed consent, the colonoscope was                        passed under direct vision. Throughout the procedure,                        the patient's blood pressure, pulse, and oxygen                        saturations were monitored continuously. The                        Colonoscope was introduced through the anus and                        advanced to the the ileocolonic anastomosis. The                        colonoscopy was somewhat difficult due to a tortuous                        colon. Successful completion of the procedure was aided                        by withdrawing the scope and replacing with the adult                        endoscope. The patient tolerated the procedure well.                        The quality of the bowel preparation was good. Findings:      The adult scope would not pass at the sigmoid turn  and I switched to a       EGD scope and passed to the anastamosis.      Many small-mouthed diverticula were found in the sigmoid colon,       descending colon and transverse colon.  Internal hemorrhoids were found during endoscopy. The hemorrhoids were       small and Grade I (internal hemorrhoids that do not prolapse). Impression:           - Diverticulosis in the sigmoid colon, in the                        descending colon and in the transverse colon.                       - Internal hemorrhoids.                       - No specimens collected. Recommendation:       - Repeat colonoscopy in 2 years for surveillance. Manya Silvas, MD 01/17/2018 11:35:33 AM This report has been signed electronically. Number of Addenda: 0 Note Initiated On: 01/17/2018 11:05 AM Scope Withdrawal Time: 0 hours 5 minutes 39 seconds  Total Procedure Duration: 0 hours 15 minutes 35 seconds       [facility]

## 2018-01-17 NOTE — Anesthesia Post-op Follow-up Note (Signed)
Anesthesia QCDR form completed.        

## 2018-01-17 NOTE — Procedures (Unsigned)
[place], [place] [address]  DATE OF SERVICE: January 07, 2018  CAROTID DOPPLER INTERPRETATION:  Bilateral Carotid Ultrsasound and Color Doppler Examination was performed. The RIGHT CCA shows no significant plaque in the vessel. The LEFT CCA shows no significant plaque in the vessel. There was no intimal thickening noted in the RIGHT carotid artery. There was no intimal thickening in the LEFT carotid artery.  The RIGHT CCA shows peak systolic velocity of ***cm per second. The end diastolic velocity is *** cm per second on the RIGHT side. The RIGHT ICA shows peak systolic velocity of 73 per second. RIGHT sided ICA end diastolic velocity is 21 cm per second. The RIGHT ECA shows a peak systolic velocity of 84 cm per second. The ICA/CCA ratio is calculated to be 0.76. This suggests no significant arterial stenosis. The Vertebral Artery shows antegrade flow.  The LEFT CCA shows peak systolic velocity of ***cm per second. The end diastolic velocity is ***cm per second on the LEFT side. The LEFT ICA shows peak systolic velocity of 98 per second. LEFT sided ICA end diastolic velocity is 33 cm per second. The LEFT ECA shows a peak systolic velocity of 76 cm per second. The ICA/CCA ratio is calculated to be 0.75. This suggests no significant carotid stenosis. The Vertebral Artery shows antegrade flow.   Impression:    The RIGHT CAROTID shows no significant stenosis in the vessel. The LEFT CAROTID shows no significant stenosis in.  There is no significant plaque formation noted on the LEFT and no significant plaque formation on the RIGHT  side. Consider a repeat Carotid doppler if clinical situation and symptoms warrant in 6-12 months. Patient should be encouraged to change lifestyles such as smoking cessation, regular exercise and dietary modification. Use of statins in the right clinical setting and ASA is encouraged.  Allyne Gee, MD [facility] Pulmonary Critical Care  Medicine

## 2018-01-18 ENCOUNTER — Encounter: Payer: Self-pay | Admitting: Unknown Physician Specialty

## 2018-01-29 ENCOUNTER — Encounter: Payer: Self-pay | Admitting: Emergency Medicine

## 2018-01-29 ENCOUNTER — Emergency Department: Payer: Medicare Other

## 2018-01-29 ENCOUNTER — Inpatient Hospital Stay
Admission: EM | Admit: 2018-01-29 | Discharge: 2018-02-01 | DRG: 392 | Disposition: A | Discharge status: Home / Self Care | Payer: Medicare Other | Attending: Surgery | Admitting: Surgery

## 2018-01-29 ENCOUNTER — Other Ambulatory Visit: Payer: Self-pay

## 2018-01-29 DIAGNOSIS — Z85038 Personal history of other malignant neoplasm of large intestine: Secondary | ICD-10-CM | POA: Diagnosis not present

## 2018-01-29 DIAGNOSIS — D72829 Elevated white blood cell count, unspecified: Secondary | ICD-10-CM | POA: Diagnosis not present

## 2018-01-29 DIAGNOSIS — M797 Fibromyalgia: Secondary | ICD-10-CM | POA: Diagnosis present

## 2018-01-29 DIAGNOSIS — Z90710 Acquired absence of both cervix and uterus: Secondary | ICD-10-CM | POA: Diagnosis not present

## 2018-01-29 DIAGNOSIS — E039 Hypothyroidism, unspecified: Secondary | ICD-10-CM | POA: Diagnosis present

## 2018-01-29 DIAGNOSIS — F329 Major depressive disorder, single episode, unspecified: Secondary | ICD-10-CM | POA: Diagnosis present

## 2018-01-29 DIAGNOSIS — G4730 Sleep apnea, unspecified: Secondary | ICD-10-CM | POA: Diagnosis present

## 2018-01-29 DIAGNOSIS — Z808 Family history of malignant neoplasm of other organs or systems: Secondary | ICD-10-CM

## 2018-01-29 DIAGNOSIS — K219 Gastro-esophageal reflux disease without esophagitis: Secondary | ICD-10-CM | POA: Diagnosis present

## 2018-01-29 DIAGNOSIS — Z823 Family history of stroke: Secondary | ICD-10-CM | POA: Diagnosis not present

## 2018-01-29 DIAGNOSIS — K5720 Diverticulitis of large intestine with perforation and abscess without bleeding: Secondary | ICD-10-CM

## 2018-01-29 DIAGNOSIS — Z9049 Acquired absence of other specified parts of digestive tract: Secondary | ICD-10-CM

## 2018-01-29 DIAGNOSIS — K5732 Diverticulitis of large intestine without perforation or abscess without bleeding: Secondary | ICD-10-CM | POA: Diagnosis present

## 2018-01-29 DIAGNOSIS — Z79890 Hormone replacement therapy (postmenopausal): Secondary | ICD-10-CM | POA: Diagnosis not present

## 2018-01-29 DIAGNOSIS — R1032 Left lower quadrant pain: Secondary | ICD-10-CM | POA: Diagnosis not present

## 2018-01-29 DIAGNOSIS — K630 Abscess of intestine: Secondary | ICD-10-CM | POA: Diagnosis not present

## 2018-01-29 DIAGNOSIS — Z79899 Other long term (current) drug therapy: Secondary | ICD-10-CM | POA: Diagnosis not present

## 2018-01-29 LAB — COMPREHENSIVE METABOLIC PANEL WITH GFR
ALT: 12 U/L (ref 0–44)
AST: 12 U/L — ABNORMAL LOW (ref 15–41)
Albumin: 3.7 g/dL (ref 3.5–5.0)
Alkaline Phosphatase: 70 U/L (ref 38–126)
Anion gap: 8 (ref 5–15)
BUN: 13 mg/dL (ref 8–23)
CO2: 29 mmol/L (ref 22–32)
Calcium: 9.3 mg/dL (ref 8.9–10.3)
Chloride: 102 mmol/L (ref 98–111)
Creatinine, Ser: 0.88 mg/dL (ref 0.44–1.00)
GFR calc Af Amer: >60 mL/min
GFR calc non Af Amer: >60 mL/min
Glucose, Bld: 102 mg/dL — ABNORMAL HIGH (ref 70–99)
Potassium: 3.8 mmol/L (ref 3.5–5.1)
Sodium: 139 mmol/L (ref 135–145)
Total Bilirubin: 0.6 mg/dL (ref 0.3–1.2)
Total Protein: 6.9 g/dL (ref 6.5–8.1)

## 2018-01-29 LAB — URINALYSIS, COMPLETE (UACMP) WITH MICROSCOPIC
Bacteria, UA: NONE SEEN
Bilirubin Urine: NEGATIVE
Glucose, UA: NEGATIVE mg/dL
Ketones, ur: NEGATIVE mg/dL
Leukocytes, UA: NEGATIVE
Nitrite: NEGATIVE
Protein, ur: NEGATIVE mg/dL
Specific Gravity, Urine: 1.011 (ref 1.005–1.030)
pH: 6 (ref 5.0–8.0)

## 2018-01-29 LAB — CBC
HCT: 36 % (ref 35.0–47.0)
HEMOGLOBIN: 12.6 g/dL (ref 12.0–16.0)
MCH: 31.2 pg (ref 26.0–34.0)
MCHC: 35.1 g/dL (ref 32.0–36.0)
MCV: 89 fL (ref 80.0–100.0)
Platelets: 327 10*3/uL (ref 150–440)
RBC: 4.05 MIL/uL (ref 3.80–5.20)
RDW: 13.1 % (ref 11.5–14.5)
WBC: 17.2 10*3/uL — ABNORMAL HIGH (ref 3.6–11.0)

## 2018-01-29 LAB — LIPASE, BLOOD: LIPASE: 23 U/L (ref 11–51)

## 2018-01-29 MED ORDER — IOPAMIDOL (ISOVUE-300) INJECTION 61%
100.0000 mL | Freq: Once | INTRAVENOUS | Status: AC | PRN
  Administered 2018-01-29: 100 mL via INTRAVENOUS

## 2018-01-29 MED ORDER — TRAMADOL HCL 50 MG PO TABS: 50.0000 mg | ORAL_TABLET | Freq: Four times a day (QID) | ORAL | Status: DC | PRN

## 2018-01-29 MED ORDER — PIPERACILLIN-TAZOBACTAM 3.375 G IVPB
3.3750 g | Freq: Three times a day (TID) | INTRAVENOUS | Status: DC
  Administered 2018-01-29 – 2018-01-31 (×5): 3.375 g via INTRAVENOUS
  Filled 2018-01-29 (×5): qty 50

## 2018-01-29 MED ORDER — PIPERACILLIN-TAZOBACTAM 3.375 G IVPB 30 MIN
3.3750 g | Freq: Once | INTRAVENOUS | Status: AC
  Administered 2018-01-29: 3.375 g via INTRAVENOUS
  Filled 2018-01-29: qty 50

## 2018-01-29 MED ORDER — ONDANSETRON HCL 4 MG/2ML IJ SOLN: 4.0000 mg | Freq: Four times a day (QID) | INTRAMUSCULAR | Status: DC | PRN

## 2018-01-29 MED ORDER — FAMOTIDINE IN NACL 20-0.9 MG/50ML-% IV SOLN
20.0000 mg | Freq: Two times a day (BID) | INTRAVENOUS | Status: DC
  Administered 2018-01-29 – 2018-01-30 (×3): 20 mg via INTRAVENOUS
  Filled 2018-01-29 (×3): qty 50

## 2018-01-29 MED ORDER — SODIUM CHLORIDE 0.9 % IV BOLUS
1000.0000 mL | Freq: Once | INTRAVENOUS | Status: AC
  Administered 2018-01-29: 1000 mL via INTRAVENOUS

## 2018-01-29 MED ORDER — KETOROLAC TROMETHAMINE 15 MG/ML IJ SOLN: 15.0000 mg | Freq: Four times a day (QID) | INTRAMUSCULAR | Status: DC | PRN

## 2018-01-29 MED ORDER — ONDANSETRON 4 MG PO TBDP
4.0000 mg | ORAL_TABLET | Freq: Four times a day (QID) | ORAL | Status: DC | PRN
  Administered 2018-01-29 – 2018-02-01 (×5): 4 mg via ORAL
  Filled 2018-01-29 (×6): qty 1

## 2018-01-29 MED ORDER — LACTATED RINGERS IV SOLN
INTRAVENOUS | Status: DC
  Administered 2018-01-29: 23:00:00 via INTRAVENOUS

## 2018-01-29 MED ORDER — ENOXAPARIN SODIUM 40 MG/0.4ML ~~LOC~~ SOLN
40.0000 mg | SUBCUTANEOUS | Status: DC
  Administered 2018-01-29 – 2018-01-30 (×2): 40 mg via SUBCUTANEOUS
  Filled 2018-01-29 (×3): qty 0.4

## 2018-01-29 MED ORDER — DOCUSATE SODIUM 100 MG PO CAPS: 100.0000 mg | ORAL_CAPSULE | Freq: Two times a day (BID) | ORAL | Status: DC | PRN

## 2018-01-29 NOTE — ED Provider Notes (Signed)
[facility] Emergency Department Provider Note  ____________________________________________  Time seen: Approximately 12:32 PM  I have reviewed the triage vital signs and the nursing notes.   HISTORY  Chief Complaint Abdominal Pain   HPI [name] is a 72 y.o. female with a history of right colon cancer status post resection in 2018 who presents for evaluation of abdominal pain.  Patient had a colonoscopy 12 days ago and reports that she has had pain since the procedure however since yesterday the pain has become much more pronounced.  She also had chills and temp of 100F since yesterday.  No fever, no vomiting.  Patient does have nausea but that is chronic for her.  She reports that the pain is dull, constant, located in the left lower quadrant, nonradiating, mild but becomes moderate in intensity if she pushes on her abdomen.  She has been having normal bowel movements with no melena or hematochezia.  Past Medical History:  Diagnosis Date  . Anemia   . Carcinoma ([place])   . Colon cancer ([place]) 01/2017  . Fibromyalgia   . GERD (gastroesophageal reflux disease)   . Hemorrhoids   . Hypothyroidism   . Iron deficiency anemia 03/08/2017  . Major depressive disorder, recurrent episode, mild ([place])   . PONV (postoperative nausea and vomiting)   . Sleep apnea    USES CPAP    Patient Active Problem List   Diagnosis Date Noted  . Iron deficiency anemia 03/08/2017  . Cancer of right colon ([place]) 12/31/2016  . MDD (major depressive disorder), recurrent episode, severe ([place]) 06/04/2014    Past Surgical History:  Procedure Laterality Date  . ABDOMINAL HYSTERECTOMY  1990  . BREAST BIOPSY Left 2004   benign  . CATARACT EXTRACTION, BILATERAL Bilateral 05/2012  . CHOLECYSTECTOMY  2001  . COLONOSCOPY WITH PROPOFOL N/A 12/28/2016   Procedure: COLONOSCOPY WITH PROPOFOL;  Surgeon: Manya Silvas, MD;  Location[facility] ENDOSCOPY;  Service: Endoscopy;   Laterality: N/A;  . COLONOSCOPY WITH PROPOFOL N/A 01/17/2018   Procedure: COLONOSCOPY WITH PROPOFOL;  Surgeon: Manya Silvas, MD;  Location: [facility] ENDOSCOPY;  Service: Endoscopy;  Laterality: N/A;  . ESOPHAGOGASTRODUODENOSCOPY (EGD) WITH PROPOFOL N/A 12/28/2016   Procedure: ESOPHAGOGASTRODUODENOSCOPY (EGD) WITH PROPOFOL;  Surgeon: Manya Silvas, MD;  Location: [facility] ENDOSCOPY;  Service: Endoscopy;  Laterality: N/A;  . LAPAROSCOPIC RIGHT COLECTOMY Right 01/21/2017   Procedure: LAPAROSCOPIC ASSISTED RIGHT COLECTOMY;  Surgeon: Robert Bellow, MD;  Location: [facility] ORS;  Service: General;  Laterality: Right;    Prior to Admission medications   Medication Sig Start Date End Date Taking? Authorizing Provider  cetirizine (ZYRTEC) 10 MG tablet Take 10 mg by mouth daily as needed.     [provider]  estradiol (ESTRACE) 0.5 MG tablet Take 1 tablet (0.5 mg total) by mouth daily. 06/14/17   Ronnell Freshwater, NP  lamoTRIgine (LAMICTAL) 200 MG tablet Take 1 tablet (200 mg total) by mouth every morning. 12/15/17   Arfeen, Arlyce Harman, MD  levothyroxine (SYNTHROID, LEVOTHROID) 88 MCG tablet Take 88 mcg by mouth daily before breakfast.  12/11/14   [provider]  methylphenidate (RITALIN) 5 MG tablet Take 1 tablet (5 mg total) by mouth daily. 12/15/17 03/15/18  Aundra Dubin, MD  montelukast (SINGULAIR) 10 MG tablet Take 1 tablet (10 mg total) by mouth at bedtime. 11/24/17   Lavera Guise, MD  Multiple Vitamins-Minerals (MULTIVITAMIN WITH MINERALS) tablet Take 1 tablet by mouth daily.    [provider]  omeprazole (PRILOSEC) 20 MG capsule Take 1 capsule (20 mg total) by mouth daily before breakfast. 06/14/17   Ronnell Freshwater, NP  ondansetron (ZOFRAN-ODT) 8 MG disintegrating tablet Take 1 tablet (8 mg total) by mouth every morning. AND PRN 06/14/17   Ronnell Freshwater, NP  thyroid (ARMOUR) 30 MG tablet Take 30 mg by mouth daily before breakfast.    [provider]    traZODone (DESYREL) 100 MG tablet Take 1 tablet (100 mg total) by mouth at bedtime. 12/15/17   Arfeen, Arlyce Harman, MD  TRINTELLIX 20 MG TABS tablet Take 1 tablet (20 mg total) by mouth every morning. 12/15/17   Arfeen, Arlyce Harman, MD  prochlorperazine (COMPAZINE) 10 MG tablet Take 1 tablet (10 mg total) every 6 (six) hours as needed by mouth (Nausea or vomiting). 03/08/17 03/08/17  Earlie Server, MD    Allergies Other and Codeine  Family History  Problem Relation Age of Onset  . Depression Brother   . Alcohol abuse Brother   . Suicidality Brother   . Stroke Mother   . Clotting disorder Brother   . Colon polyps Father   . Melanoma Sister   . Bipolar disorder Neg Hx     Social History Social History   Tobacco Use  . Smoking status: Never Smoker  . Smokeless tobacco: Never Used  Substance Use Topics  . Alcohol use: Not Currently    Alcohol/week: 0.0 standard drinks    Comment:    . Drug use: No    Review of Systems  Constitutional: +fever and chills Eyes: Negative for visual changes. ENT: Negative for sore throat. Neck: No neck pain  Cardiovascular: Negative for chest pain. Respiratory: Negative for shortness of breath. Gastrointestinal: + LLQ abdominal pain. No vomiting or diarrhea. Genitourinary: Negative for dysuria. Musculoskeletal: Negative for back pain. Skin: Negative for rash. Neurological: Negative for headaches, weakness or numbness. Psych: No SI or HI  ____________________________________________   PHYSICAL EXAM:  VITAL SIGNS: ED Triage Vitals  Enc Vitals Group     BP 01/29/18 1058 (!) 137/53     Pulse Rate 01/29/18 1058 85     Resp 01/29/18 1058 14     Temp 01/29/18 1058 99.3 F (37.4 C)     Temp Source 01/29/18 1058 Oral     SpO2 01/29/18 1058 100 %     Weight 01/29/18 1058 153 lb (69.4 kg)     Height 01/29/18 1058 5\' 2"  (1.575 m)     Head Circumference --      Peak Flow --      Pain Score 01/29/18 1105 2     Pain Loc --      Pain Edu? --      Excl. in  [place]? --     Constitutional: Alert and oriented. Well appearing and in no apparent distress. HEENT:      Head: Normocephalic and atraumatic.         Eyes: Conjunctivae are normal. Sclera is non-icteric.       Mouth/Throat: Mucous membranes are moist.       Neck: Supple with no signs of meningismus. Cardiovascular: Regular rate and rhythm. No murmurs, gallops, or rubs. 2+ symmetrical distal pulses are present in all extremities. No JVD. Respiratory: Normal respiratory effort. Lungs are clear to auscultation bilaterally. No wheezes, crackles, or rhonchi.  Gastrointestinal: Soft, patient is tender to palpation on the left lower quadrant and suprapubic region, and non distended with positive bowel sounds. No rebound or  guarding. Genitourinary: No CVA tenderness. Musculoskeletal: Nontender with normal range of motion in all extremities. No edema, cyanosis, or erythema of extremities. Neurologic: Normal speech and language. Face is symmetric. Moving all extremities. No gross focal neurologic deficits are appreciated. Skin: Skin is warm, dry and intact. No rash noted. Psychiatric: Mood and affect are normal. Speech and behavior are normal.  ____________________________________________   LABS (all labs ordered are listed, but only abnormal results are displayed)  Labs Reviewed  COMPREHENSIVE METABOLIC PANEL - Abnormal; Notable for the following components:      Result Value   Glucose, Bld 102 (*)    AST 12 (*)    All other components within normal limits  CBC - Abnormal; Notable for the following components:   WBC 17.2 (*)    All other components within normal limits  URINALYSIS, COMPLETE (UACMP) WITH MICROSCOPIC - Abnormal; Notable for the following components:   Color, Urine YELLOW (*)    APPearance HAZY (*)    Hgb urine dipstick SMALL (*)    All other components within normal limits  LIPASE, BLOOD   ____________________________________________  EKG  none    ____________________________________________  RADIOLOGY  I have personally reviewed the images performed during this visit and I agree with the Radiologist's read.   Interpretation by Radiologist:  Ct Abdomen Pelvis W Contrast  Result Date: 01/29/2018 CLINICAL DATA:  Left lower quadrant pain for the past week. Recent colonoscopy. History of colon cancer. EXAM: CT ABDOMEN AND PELVIS WITH CONTRAST TECHNIQUE: Multidetector CT imaging of the abdomen and pelvis was performed using the standard protocol following bolus administration of intravenous contrast. CONTRAST:  114mL ISOVUE-300 IOPAMIDOL (ISOVUE-300) INJECTION 61% COMPARISON:  01/11/2018; 12/24/2016 FINDINGS: Lower chest: Limited visualization of lower thorax demonstrates minimal dependent subpleural ground-glass atelectasis. There is minimal subsegmental atelectasis within the inferior aspect of the lingula. Normal heart size. No pericardial effusion. Hepatobiliary: Normal hepatic contour. Punctate (approximately 0.6 cm) hypoattenuating lesion within the lateral segment of left lobe of the liver is too small to adequately characterize though morphologically similar to the 12/2016 examination favored to represent a hepatic cyst. There is a minimal amount of focal fatty infiltration adjacent to the fissure for the ligamentum teres. No discrete worrisome hepatic lesions. Post cholecystectomy. Ductal dilatation. No intra extrahepatic biliary no ascites. Pancreas: Normal appearance of the pancreas Spleen: Normal appearance the spleen Adrenals/Urinary Tract: There is symmetric enhancement and excretion of the bilateral kidneys. No definite renal stones on this postcontrast examination. Punctate (approximately 0.5 cm) hypoattenuating lesion within the posterior interpolar aspect of the left kidney is too small to accurately characterize though morphologically unchanged compared to the 12/2016 examination and favored to represent a renal cyst. No discrete  right-sided renal lesions. No urinary obstruction or perinephric stranding. Normal appearance the bilateral adrenal glands. Normal appearance of the urinary bladder given degree distention. Stomach/Bowel: Rather extensive colonic diverticulosis. There is ill-defined stranding about the sigmoid colon within the left lower abdomen/pelvis with associated approximately 1.9 x 1.7 x 1.6 cm extraluminal air and fluid collection (axial image 58, series 2, coronal image 31, series 5) suggestive of a developing diverticular abscess. No frank pneumoperitoneum. No pneumatosis or portal venous gas Post right hemicolectomy without evidence of enteric obstruction. Vascular/Lymphatic: Minimal amount of calcified atherosclerotic plaque within a normal caliber abdominal aorta. The major branch vessels of the abdominal aorta appear widely patent on this non CTA examination. No bulky retroperitoneal, mesenteric, pelvic or inguinal lymphadenopathy. Reproductive: Post hysterectomy. No discrete adnexal lesion. No free fluid in  the pelvic cul-de-sac. Other: Regional soft tissues appear normal. Musculoskeletal: No acute or aggressive osseous abnormalities. Moderate severe DDD of L3-L4 with disc space height loss, endplate irregularity and sclerosis. IMPRESSION: 1. Examination is positive for acute diverticulitis involving the sigmoid colon within the left lower abdomen/pelvis with associated tiny (approximately 1.9 cm) diverticular abscess. Note, the collection is currently small and there is a lack of an adequate percutaneous window to allow for percutaneous aspiration and/or drainage catheter placement at this time. 2. Post right hemicolectomy without evidence of enteric obstruction. 3.  Aortic Atherosclerosis (ICD10-I70.0). Electronically Signed   By: Sandi Mariscal M.D.   On: 01/29/2018 14:06      ____________________________________________   PROCEDURES  Procedure(s) performed: None Procedures Critical Care performed:  yes  CRITICAL CARE Performed by: Rudene Re  ?  Total critical care time: 35 min  Critical care time was exclusive of separately billable procedures and treating other patients.  Critical care was necessary to treat or prevent imminent or life-threatening deterioration.  Critical care was time spent personally by me on the following activities: development of treatment plan with patient and/or surrogate as well as nursing, discussions with consultants, evaluation of patient's response to treatment, examination of patient, obtaining history from patient or surrogate, ordering and performing treatments and interventions, ordering and review of laboratory studies, ordering and review of radiographic studies, pulse oximetry and re-evaluation of patient's condition.  ____________________________________________   INITIAL IMPRESSION / ASSESSMENT AND PLAN / ED COURSE  72 y.o. female with a history of right colon cancer status post resection in 2018 who presents for evaluation of LLQ abdominal pain x 12 days since colonoscopy and now with fever and chills.  Patient has a low-grade temperature of 99.3 and significant tenderness to palpation of the left lower quadrant.  Review of colonoscopy note shows that Dr. Vira Agar had some difficulty passing the sigmoid colon during the procedure.  Differential diagnoses including bowel perforation versus diverticulitis.  Patient noted to have significant diverticulosis on colonoscopy.  Her labs show leukocytosis with white count of 17.  We will send patient for CT abdomen pelvis.  Clinical Course as of Jan 29 1426  Sat Jan 29, 2018  1425 CT concerning for significant sigmoid diverticulitis with a small 2 cm forming abscess.  Discussed with Dr. Lysle Pearl from surgery who recommended admission for monitoring.  Patient will be given IV Zosyn.  She remains hemodynamically stable otherwise.   [CV]    Clinical Course User Index [CV] Alfred Levins [place], MD      As part of my medical decision making, I reviewed the following data within the [facility] notes reviewed and incorporated, Labs reviewed , Old chart reviewed, Radiograph reviewed , A consult was requested and obtained from this/these consultant(s) Surgery, Notes from prior ED visits and [place] Controlled Substance Database    Pertinent labs & imaging results that were available during my care of the patient were reviewed by me and considered in my medical decision making (see chart for details).    ____________________________________________   FINAL CLINICAL IMPRESSION(S) / ED DIAGNOSES  Final diagnoses:  Abscess of sigmoid colon due to diverticulitis      NEW MEDICATIONS STARTED DURING THIS VISIT:  ED Discharge Orders    None       Note:  This document was prepared using Dragon voice recognition software and may include unintentional dictation errors.    Alfred Levins, [place], MD 01/29/18 [phone]

## 2018-01-29 NOTE — Consult Note (Signed)
Pharmacy Antibiotic Note  [name] is a 72 y.o. female admitted on 01/29/2018 with Abscess of sigmoid colon due to diverticulitis. Pharmacy has been consulted for Zosyn dosing.  Plan: Start Zosyn 3.375g IV q8h (4 hour infusion).  Height: 5\' 2"  (157.5 cm) Weight: 153 lb (69.4 kg) IBW/kg (Calculated) : 50.1  Temp (24hrs), Avg:99.3 F (37.4 C), Min:99.3 F (37.4 C), Max:99.3 F (37.4 C)  Recent Labs  Lab 01/29/18 1110  WBC 17.2*  CREATININE 0.88    Estimated Creatinine Clearance: 54.3 mL/min (by C-G formula based on SCr of 0.88 mg/dL).    Allergies  Allergen Reactions  . Other     BAND-AIDS IF LEFT ON FOR EXTENDED PERIODS-REDNESS  . Codeine     Nausea    Antimicrobials this admission: 9/28 Zosyn  >>   Thank you for allowing pharmacy to be a part of this patient's care.  Pernell Dupre, PharmD, BCPS Clinical Pharmacist 01/29/2018 2:47 PM

## 2018-01-29 NOTE — H&P (Signed)
Subjective:   CC: sigmoid diverticulitis  HPI:  [name] is a 72 y.o. female who was consulted by Alfred Levins for issue above.  Symptoms were first noted a few days ago. Pain is in LLQ, suprapubic rgion, confined to the area, without radiation.  Associated with nothing specific, exacerbated by walking around   Past Medical History:  has a past medical history of Anemia, Carcinoma ([place]), Colon cancer ([place]) (01/2017), Fibromyalgia, GERD (gastroesophageal reflux disease), Hemorrhoids, Hypothyroidism, Iron deficiency anemia (03/08/2017), Major depressive disorder, recurrent episode, mild ([place]), PONV (postoperative nausea and vomiting), and Sleep apnea.  Past Surgical History:  Past Surgical History:  Procedure Laterality Date  . ABDOMINAL HYSTERECTOMY  1990  . BREAST BIOPSY Left 2004   benign  . CATARACT EXTRACTION, BILATERAL Bilateral 05/2012  . CHOLECYSTECTOMY  2001  . COLONOSCOPY WITH PROPOFOL N/A 12/28/2016   Procedure: COLONOSCOPY WITH PROPOFOL;  Surgeon: Manya Silvas, MD;  Location: [facility] ENDOSCOPY;  Service: Endoscopy;  Laterality: N/A;  . COLONOSCOPY WITH PROPOFOL N/A 01/17/2018   Procedure: COLONOSCOPY WITH PROPOFOL;  Surgeon: Manya Silvas, MD;  Location: [facility] ENDOSCOPY;  Service: Endoscopy;  Laterality: N/A;  . ESOPHAGOGASTRODUODENOSCOPY (EGD) WITH PROPOFOL N/A 12/28/2016   Procedure: ESOPHAGOGASTRODUODENOSCOPY (EGD) WITH PROPOFOL;  Surgeon: Manya Silvas, MD;  Location: [facility] ENDOSCOPY;  Service: Endoscopy;  Laterality: N/A;  . LAPAROSCOPIC RIGHT COLECTOMY Right 01/21/2017   Procedure: LAPAROSCOPIC ASSISTED RIGHT COLECTOMY;  Surgeon: Robert Bellow, MD;  Location: [facility] ORS;  Service: General;  Laterality: Right;    Family History: family history includes Alcohol abuse in her brother; Clotting disorder in her brother; Colon polyps in her father; Depression in her brother; Melanoma in her sister; Stroke in her mother; Suicidality in her brother.  Social  History:  reports that she has never smoked. She has never used smokeless tobacco. She reports that she drank alcohol. She reports that she does not use drugs.  Current Medications:  Medications Prior to Admission  Medication Sig Dispense Refill  . calcium-vitamin D (OSCAL WITH D) 500-200 MG-UNIT tablet Take 2 tablets by mouth 2 (two) times daily.    Marland Kitchen estradiol (ESTRACE) 0.5 MG tablet Take 1 tablet (0.5 mg total) by mouth daily. 90 tablet 4  . lamoTRIgine (LAMICTAL) 200 MG tablet Take 1 tablet (200 mg total) by mouth every morning. 90 tablet 0  . levothyroxine (SYNTHROID, LEVOTHROID) 88 MCG tablet Take 88 mcg by mouth daily before breakfast.   2  . methylphenidate (RITALIN) 5 MG tablet Take 1 tablet (5 mg total) by mouth daily. 90 tablet 0  . Multiple Vitamins-Minerals (MULTIVITAMIN WITH MINERALS) tablet Take 1 tablet by mouth daily.    Marland Kitchen omeprazole (PRILOSEC) 20 MG capsule Take 1 capsule (20 mg total) by mouth daily before breakfast. 90 capsule 4  . ondansetron (ZOFRAN-ODT) 8 MG disintegrating tablet Take 1 tablet (8 mg total) by mouth every morning. AND PRN 90 tablet 4  . thyroid (ARMOUR) 30 MG tablet Take 30 mg by mouth daily before breakfast.    . traZODone (DESYREL) 100 MG tablet Take 1 tablet (100 mg total) by mouth at bedtime. 90 tablet 0  . TRINTELLIX 20 MG TABS tablet Take 1 tablet (20 mg total) by mouth every morning. 30 tablet 2  . cetirizine (ZYRTEC) 10 MG tablet Take 10 mg by mouth daily as needed for allergies.     . montelukast (SINGULAIR) 10 MG tablet Take 1 tablet (10 mg total) by mouth at bedtime. (Patient not taking: Reported on 01/29/2018) 30  tablet 3    Allergies:  Allergies as of 01/29/2018 - Review Complete 01/29/2018  Allergen Reaction Noted  . Other  01/14/2017  . Codeine  07/15/2012    ROS:  A 15 point review of systems was performed and pertinent positives and negatives noted in HPI   Objective:     BP (!) 125/49 (BP Location: Right Arm)   Pulse 74   Temp  98.5 F (36.9 C) (Oral)   Resp 18   Ht 5\' 2"  (1.575 m)   Wt 69.3 kg   LMP  (LMP Unknown)   SpO2 98%   BMI 27.94 kg/m   Constitutional :  alert, cooperative, appears stated age and no distress  Lymphatics/Throat:  no asymmetry, masses, or scars  Respiratory:  clear to auscultation bilaterally  Cardiovascular:  regular rate and rhythm  Gastrointestinal: soft, no guarding, but focal tenderness noted in LLQ and supra pubic region.   Musculoskeletal: Steady gait and movement  Skin: Cool and moist,    Psychiatric: Normal affect, non-agitated, not confused       LABS:  CMP Latest Ref Rng & Units 01/29/2018 01/14/2018 01/11/2018  Glucose 70 - 99 mg/dL 102(H) 103(H) -  BUN 8 - 23 mg/dL 13 14 -  Creatinine 0.44 - 1.00 mg/dL 0.88 1.00 0.90  Sodium 135 - 145 mmol/L 139 139 -  Potassium 3.5 - 5.1 mmol/L 3.8 4.2 -  Chloride 98 - 111 mmol/L 102 104 -  CO2 22 - 32 mmol/L 29 29 -  Calcium 8.9 - 10.3 mg/dL 9.3 9.7 -  Total Protein 6.5 - 8.1 g/dL 6.9 7.0 -  Total Bilirubin 0.3 - 1.2 mg/dL 0.6 0.6 -  Alkaline Phos 38 - 126 U/L 70 67 -  AST 15 - 41 U/L 12(L) 17 -  ALT 0 - 44 U/L 12 17 -   CBC Latest Ref Rng & Units 01/29/2018 01/14/2018 10/18/2017  WBC 3.6 - 11.0 K/uL 17.2(H) 8.2 9.9  Hemoglobin 12.0 - 16.0 g/dL 12.6 13.3 13.9  Hematocrit 35.0 - 47.0 % 36.0 39.6 40.3  Platelets 150 - 440 K/uL 327 283 310    RADS: CLINICAL DATA:  Left lower quadrant pain for the past week. Recent colonoscopy. History of colon cancer.  EXAM: CT ABDOMEN AND PELVIS WITH CONTRAST  TECHNIQUE: Multidetector CT imaging of the abdomen and pelvis was performed using the standard protocol following bolus administration of intravenous contrast.  CONTRAST:  140mL ISOVUE-300 IOPAMIDOL (ISOVUE-300) INJECTION 61%  COMPARISON:  01/11/2018; 12/24/2016  FINDINGS: Lower chest: Limited visualization of lower thorax demonstrates minimal dependent subpleural ground-glass atelectasis. There is minimal subsegmental  atelectasis within the inferior aspect of the lingula. Normal heart size. No pericardial effusion.  Hepatobiliary: Normal hepatic contour. Punctate (approximately 0.6 cm) hypoattenuating lesion within the lateral segment of left lobe of the liver is too small to adequately characterize though morphologically similar to the 12/2016 examination favored to represent a hepatic cyst. There is a minimal amount of focal fatty infiltration adjacent to the fissure for the ligamentum teres. No discrete worrisome hepatic lesions. Post cholecystectomy. Ductal dilatation. No intra extrahepatic biliary no ascites.  Pancreas: Normal appearance of the pancreas  Spleen: Normal appearance the spleen  Adrenals/Urinary Tract: There is symmetric enhancement and excretion of the bilateral kidneys. No definite renal stones on this postcontrast examination. Punctate (approximately 0.5 cm) hypoattenuating lesion within the posterior interpolar aspect of the left kidney is too small to accurately characterize though morphologically unchanged compared to the 12/2016 examination and favored to  represent a renal cyst. No discrete right-sided renal lesions. No urinary obstruction or perinephric stranding.  Normal appearance the bilateral adrenal glands.  Normal appearance of the urinary bladder given degree distention.  Stomach/Bowel: Rather extensive colonic diverticulosis. There is ill-defined stranding about the sigmoid colon within the left lower abdomen/pelvis with associated approximately 1.9 x 1.7 x 1.6 cm extraluminal air and fluid collection (axial image 58, series 2, coronal image 31, series 5) suggestive of a developing diverticular abscess. No frank pneumoperitoneum. No pneumatosis or portal venous gas  Post right hemicolectomy without evidence of enteric obstruction.  Vascular/Lymphatic: Minimal amount of calcified atherosclerotic plaque within a normal caliber abdominal aorta. The  major branch vessels of the abdominal aorta appear widely patent on this non CTA examination.  No bulky retroperitoneal, mesenteric, pelvic or inguinal lymphadenopathy.  Reproductive: Post hysterectomy. No discrete adnexal lesion. No free fluid in the pelvic cul-de-sac.  Other: Regional soft tissues appear normal.  Musculoskeletal: No acute or aggressive osseous abnormalities. Moderate severe DDD of L3-L4 with disc space height loss, endplate irregularity and sclerosis.  IMPRESSION: 1. Examination is positive for acute diverticulitis involving the sigmoid colon within the left lower abdomen/pelvis with associated tiny (approximately 1.9 cm) diverticular abscess. Note, the collection is currently small and there is a lack of an adequate percutaneous window to allow for percutaneous aspiration and/or drainage catheter placement at this time. 2. Post right hemicolectomy without evidence of enteric obstruction. 3.  Aortic Atherosclerosis (ICD10-I70.0).   Electronically Signed   By: Sandi Mariscal M.D.   On: 01/29/2018 14:06 Assessment:   Sigmoid diverticulitis with small abscess adjacent  Plan:     Discussed pathophisiology and treatment options including inhouse abx.  Due to small abscess, leukocytosis and pain with moving around, will admit and start IV abx rather than outpatient treatment. Unlikely to need surgery at this point but will monitor closely.

## 2018-01-29 NOTE — ED Triage Notes (Signed)
Pt presents with llq pain x 1 week. She had a colonoscopy 9/16; went to UC today for the pain, which has worsened in last few days, and they sent her here because she had some fever (99.3). Hx of colon cancer; in remission at this time. Pt alert & oriented, nad noted.

## 2018-01-30 LAB — MAGNESIUM: Magnesium: 2.2 mg/dL (ref 1.7–2.4)

## 2018-01-30 LAB — BASIC METABOLIC PANEL
ANION GAP: 10 (ref 5–15)
BUN: 12 mg/dL (ref 8–23)
CALCIUM: 8.6 mg/dL — AB (ref 8.9–10.3)
CO2: 24 mmol/L (ref 22–32)
CREATININE: 0.85 mg/dL (ref 0.44–1.00)
Chloride: 106 mmol/L (ref 98–111)
Glucose, Bld: 97 mg/dL (ref 70–99)
Potassium: 4 mmol/L (ref 3.5–5.1)
Sodium: 140 mmol/L (ref 135–145)

## 2018-01-30 LAB — CBC
HCT: 31.1 % — ABNORMAL LOW (ref 35.0–47.0)
Hemoglobin: 11 g/dL — ABNORMAL LOW (ref 12.0–16.0)
MCH: 31.7 pg (ref 26.0–34.0)
MCHC: 35.5 g/dL (ref 32.0–36.0)
MCV: 89.1 fL (ref 80.0–100.0)
PLATELETS: 291 10*3/uL (ref 150–440)
RBC: 3.49 MIL/uL — ABNORMAL LOW (ref 3.80–5.20)
RDW: 13.1 % (ref 11.5–14.5)
WBC: 10.4 10*3/uL (ref 3.6–11.0)

## 2018-01-30 LAB — PHOSPHORUS: PHOSPHORUS: 3.7 mg/dL (ref 2.5–4.6)

## 2018-01-30 MED ORDER — ACETAMINOPHEN 325 MG PO TABS
650.0000 mg | ORAL_TABLET | Freq: Four times a day (QID) | ORAL | Status: DC | PRN
  Administered 2018-01-30 – 2018-01-31 (×2): 650 mg via ORAL
  Filled 2018-01-30 (×2): qty 2

## 2018-01-30 MED ORDER — KCL IN DEXTROSE-NACL 40-5-0.45 MEQ/L-%-% IV SOLN
INTRAVENOUS | Status: DC
  Administered 2018-01-30 (×2): via INTRAVENOUS
  Filled 2018-01-30 (×3): qty 1000

## 2018-01-30 MED ORDER — TRAZODONE HCL 100 MG PO TABS
100.0000 mg | ORAL_TABLET | Freq: Every day | ORAL | Status: DC
  Administered 2018-01-30 – 2018-01-31 (×3): 100 mg via ORAL
  Filled 2018-01-30 (×3): qty 1

## 2018-01-30 MED ORDER — LAMOTRIGINE 100 MG PO TABS
200.0000 mg | ORAL_TABLET | Freq: Every day | ORAL | Status: DC
  Administered 2018-01-30 – 2018-02-01 (×3): 200 mg via ORAL
  Filled 2018-01-30 (×3): qty 2

## 2018-01-30 MED ORDER — VORTIOXETINE HBR 5 MG PO TABS
20.0000 mg | ORAL_TABLET | Freq: Every day | ORAL | Status: DC
  Administered 2018-01-30 – 2018-02-01 (×3): 20 mg via ORAL
  Filled 2018-01-30: qty 1
  Filled 2018-01-30 (×3): qty 4

## 2018-01-30 NOTE — Progress Notes (Signed)
Subjective:  CC:  [name] is a 72 y.o. female  Hospital stay day 1,   sigmoid diverticulitis  HPI: No issues overnight.  Pain improved.  ROS:  A 5 point review of systems was performed and pertinent positives and negatives noted in HPI.   Objective:      Temp:  [97.7 F (36.5 C)-98.6 F (37 C)] 97.7 F (36.5 C) (09/29 0452) Pulse Rate:  [55-74] 57 (09/29 0454) Resp:  [18] 18 (09/29 0452) BP: (86-131)/(41-54) 97/41 (09/29 0454) SpO2:  [98 %-100 %] 99 % (09/29 0454) Weight:  [69.3 kg] 69.3 kg (09/28 1723)     Height: 5\' 2"  (157.5 cm) Weight: 69.3 kg BMI (Calculated): 27.94   Intake/Output this shift:  Total I/O In: -  Out: 400 [Urine:400]       Constitutional :  alert, cooperative, appears stated age and no distress  Respiratory:  clear to auscultation bilaterally  Cardiovascular:  regular rate and rhythm  Gastrointestinal: soft, no guarding, local tenderness still presnt in LLQ and suprapubic but improved..   Skin: Cool and moist.   Psychiatric: Normal affect, non-agitated, not confused       LABS:  CMP Latest Ref Rng & Units 01/30/2018 01/29/2018 01/14/2018  Glucose 70 - 99 mg/dL 97 102(H) 103(H)  BUN 8 - 23 mg/dL 12 13 14   Creatinine 0.44 - 1.00 mg/dL 0.85 0.88 1.00  Sodium 135 - 145 mmol/L 140 139 139  Potassium 3.5 - 5.1 mmol/L 4.0 3.8 4.2  Chloride 98 - 111 mmol/L 106 102 104  CO2 22 - 32 mmol/L 24 29 29   Calcium 8.9 - 10.3 mg/dL 8.6(L) 9.3 9.7  Total Protein 6.5 - 8.1 g/dL - 6.9 7.0  Total Bilirubin 0.3 - 1.2 mg/dL - 0.6 0.6  Alkaline Phos 38 - 126 U/L - 70 67  AST 15 - 41 U/L - 12(L) 17  ALT 0 - 44 U/L - 12 17   CBC Latest Ref Rng & Units 01/30/2018 01/29/2018 01/14/2018  WBC 3.6 - 11.0 K/uL 10.4 17.2(H) 8.2  Hemoglobin 12.0 - 16.0 g/dL 11.0(L) 12.6 13.3  Hematocrit 35.0 - 47.0 % 31.1(L) 36.0 39.6  Platelets 150 - 440 K/uL 291 327 283    RADS: n/a Assessment:  Sigmoid diverticulitis Improving.  Will try CLD and continue IV zosyn.   Home meds for depression restarted

## 2018-01-31 LAB — BASIC METABOLIC PANEL
Anion gap: 7 (ref 5–15)
BUN: 10 mg/dL (ref 8–23)
CHLORIDE: 108 mmol/L (ref 98–111)
CO2: 27 mmol/L (ref 22–32)
CREATININE: 0.97 mg/dL (ref 0.44–1.00)
Calcium: 8.9 mg/dL (ref 8.9–10.3)
GFR calc Af Amer: >60 mL/min (ref >60)
GFR calc non Af Amer: 58 mL/min — ABNORMAL LOW (ref >60)
GLUCOSE: 107 mg/dL — AB (ref 70–99)
Potassium: 4.1 mmol/L (ref 3.5–5.1)
SODIUM: 142 mmol/L (ref 135–145)

## 2018-01-31 LAB — PHOSPHORUS: Phosphorus: 3.1 mg/dL (ref 2.5–4.6)

## 2018-01-31 LAB — CBC
HEMATOCRIT: 34 % — AB (ref 35.0–47.0)
HEMOGLOBIN: 11.9 g/dL — AB (ref 12.0–16.0)
MCH: 31.6 pg (ref 26.0–34.0)
MCHC: 35.1 g/dL (ref 32.0–36.0)
MCV: 90.1 fL (ref 80.0–100.0)
Platelets: 308 10*3/uL (ref 150–440)
RBC: 3.77 MIL/uL — AB (ref 3.80–5.20)
RDW: 13.1 % (ref 11.5–14.5)
WBC: 7.4 10*3/uL (ref 3.6–11.0)

## 2018-01-31 LAB — MAGNESIUM: MAGNESIUM: 2.3 mg/dL (ref 1.7–2.4)

## 2018-01-31 MED ORDER — AMOXICILLIN-POT CLAVULANATE 875-125 MG PO TABS
1.0000 | ORAL_TABLET | Freq: Two times a day (BID) | ORAL | Status: DC
  Administered 2018-01-31 – 2018-02-01 (×3): 1 via ORAL
  Filled 2018-01-31 (×3): qty 1

## 2018-01-31 NOTE — Progress Notes (Signed)
Subjective:  CC:  [name] is a 72 y.o. female  Hospital stay day 2,   sigmoid diverticulitis  HPI: No issues overnight.  Pain continues to improve.  ROS:  A 5 point review of systems was performed and pertinent positives and negatives noted in HPI.   Objective:      Temp:  [98.2 F (36.8 C)-98.7 F (37.1 C)] 98.4 F (36.9 C) (09/30 0502) Pulse Rate:  [56-62] 56 (09/30 0502) Resp:  [17-18] 17 (09/30 0502) BP: (120-131)/(50-56) 131/56 (09/30 0502) SpO2:  [96 %-99 %] 97 % (09/30 0502)     Height: 5\' 2"  (157.5 cm) Weight: 69.3 kg BMI (Calculated): 27.94   Intake/Output this shift:  Total I/O In: 240.8 [I.V.:206.2; IV Piggyback:34.6] Out: -    Constitutional :  alert, cooperative, appears stated age and no distress  Respiratory:  clear to auscultation bilaterally  Cardiovascular:  regular rate and rhythm  Gastrointestinal: soft, no guarding, local tenderness still presnt in LLQ and suprapubic but improved..   Skin: Cool and moist.   Psychiatric: Normal affect, non-agitated, not confused       LABS:  CMP Latest Ref Rng & Units 01/31/2018 01/30/2018 01/29/2018  Glucose 70 - 99 mg/dL 107(H) 97 102(H)  BUN 8 - 23 mg/dL 10 12 13   Creatinine 0.44 - 1.00 mg/dL 0.97 0.85 0.88  Sodium 135 - 145 mmol/L 142 140 139  Potassium 3.5 - 5.1 mmol/L 4.1 4.0 3.8  Chloride 98 - 111 mmol/L 108 106 102  CO2 22 - 32 mmol/L 27 24 29   Calcium 8.9 - 10.3 mg/dL 8.9 8.6(L) 9.3  Total Protein 6.5 - 8.1 g/dL - - 6.9  Total Bilirubin 0.3 - 1.2 mg/dL - - 0.6  Alkaline Phos 38 - 126 U/L - - 70  AST 15 - 41 U/L - - 12(L)  ALT 0 - 44 U/L - - 12   CBC Latest Ref Rng & Units 01/31/2018 01/30/2018 01/29/2018  WBC 3.6 - 11.0 K/uL 7.4 10.4 17.2(H)  Hemoglobin 12.0 - 16.0 g/dL 11.9(L) 11.0(L) 12.6  Hematocrit 35.0 - 47.0 % 34.0(L) 31.1(L) 36.0  Platelets 150 - 440 K/uL 308 291 327    RADS: n/a Assessment:  Sigmoid diverticulitis Improving.  Advance to low-fiber diet and monitor for one  more day.  Ideally will like to be pain free.  May consider repeat CT prior to discharge if persistent pain to see if any change in fluid collection that maybe amenable to IR drainage.  Switched to oral ABX.  Depression- continuing home meds

## 2018-02-01 LAB — PHOSPHORUS: Phosphorus: 3.9 mg/dL (ref 2.5–4.6)

## 2018-02-01 LAB — BASIC METABOLIC PANEL
ANION GAP: 7 (ref 5–15)
BUN: 11 mg/dL (ref 8–23)
CALCIUM: 8.8 mg/dL — AB (ref 8.9–10.3)
CO2: 27 mmol/L (ref 22–32)
CREATININE: 1.03 mg/dL — AB (ref 0.44–1.00)
Chloride: 107 mmol/L (ref 98–111)
GFR, EST NON AFRICAN AMERICAN: 54 mL/min — AB (ref >60)
Glucose, Bld: 89 mg/dL (ref 70–99)
Potassium: 3.8 mmol/L (ref 3.5–5.1)
SODIUM: 141 mmol/L (ref 135–145)

## 2018-02-01 LAB — CBC
HEMATOCRIT: 35.3 % (ref 35.0–47.0)
Hemoglobin: 12.2 g/dL (ref 12.0–16.0)
MCH: 31 pg (ref 26.0–34.0)
MCHC: 34.6 g/dL (ref 32.0–36.0)
MCV: 89.5 fL (ref 80.0–100.0)
PLATELETS: 344 10*3/uL (ref 150–440)
RBC: 3.95 MIL/uL (ref 3.80–5.20)
RDW: 13.2 % (ref 11.5–14.5)
WBC: 6.9 10*3/uL (ref 3.6–11.0)

## 2018-02-01 LAB — HIV ANTIBODY (ROUTINE TESTING W REFLEX): HIV Screen 4th Generation wRfx: NONREACTIVE

## 2018-02-01 LAB — MAGNESIUM: MAGNESIUM: 2.2 mg/dL (ref 1.7–2.4)

## 2018-02-01 MED ORDER — ACETAMINOPHEN 325 MG PO TABS: 650.0000 mg | ORAL_TABLET | Freq: Four times a day (QID) | ORAL | 0 refills | Status: DC | PRN

## 2018-02-01 MED ORDER — PROBIOTIC ACIDOPHILUS PO CAPS: 1.0000 | ORAL_CAPSULE | Freq: Every day | ORAL | 0 refills | Status: AC

## 2018-02-01 MED ORDER — AMOXICILLIN-POT CLAVULANATE 875-125 MG PO TABS: 1.0000 | ORAL_TABLET | Freq: Two times a day (BID) | ORAL | 0 refills | Status: DC

## 2018-02-01 MED ORDER — IBUPROFEN 800 MG PO TABS: 800.0000 mg | ORAL_TABLET | Freq: Three times a day (TID) | ORAL | 0 refills | Status: DC | PRN

## 2018-02-01 NOTE — Care Management Important Message (Signed)
Patient left prior to arrival on unit to deliver concurrent IM.

## 2018-02-01 NOTE — Progress Notes (Signed)
Discharge order received. Patient is alert and oriented. Vital signs stable . No signs of acute distress. Discharge instructions given. Patient verbalized understanding. No other issues noted at this time.   

## 2018-02-01 NOTE — Discharge Summary (Signed)
Physician Discharge Summary  Patient ID: [name] MRN: [id number] DOB/AGE: Jan 09, 1946 72 y.o.  Admit date: 01/29/2018 Discharge date: 02/01/2018  Admission Diagnoses: sigmoid diverticulitis with abscess  Discharge Diagnoses:  Same as above  Discharged Condition: good  Hospital Course: Patient was admitted for sigmoid diverticulitis with abscess measuring 2 cm noted on CT scan during initial work-up.  His abscess was deemed not amenable to IR drainage therefore patient was admitted for IV antibiotics and pain control.  Throughout hospital course patient slowly advance diet pain was controlled and antibiotics continue to decrease the leukocytosis also present on admission.  At time of discharge patient's pain was minimal controlled with oral narcotics, tolerating regular diet and p.o. Antibiotics.  Patient is convinced planing of some loose stool so will be prescribed probiotics in addition to oral antibiotics and pain meds on discharge.  Consults: None  Discharge Exam: Blood pressure (!) 124/52, pulse 61, temperature 98.3 F (36.8 C), temperature source Oral, resp. rate 16, height 5\' 2"  (1.575 m), weight 69.3 kg, SpO2 99 %. General appearance: alert and no distress GI: soft, non-tender; bowel sounds normal; no masses,  no organomegaly  Disposition:  Discharge disposition: 01-Home or Self Care       Discharge Instructions    Discharge patient   Complete by:  As directed    Discharge disposition:  01-Home or Self Care   Discharge patient date:  02/01/2018     Allergies as of 02/01/2018      Reactions   Other    BAND-AIDS IF LEFT ON FOR EXTENDED PERIODS-REDNESS   Codeine    Nausea      Medication List    TAKE these medications   acetaminophen 325 MG tablet Commonly known as:  TYLENOL Take 2 tablets (650 mg total) by mouth every 6 (six) hours as needed for mild pain or moderate pain (pain).   amoxicillin-clavulanate 875-125 MG tablet Commonly known as:   AUGMENTIN Take 1 tablet by mouth every 12 (twelve) hours.   calcium-vitamin D 500-200 MG-UNIT tablet Commonly known as:  OSCAL WITH D Take 2 tablets by mouth 2 (two) times daily.   cetirizine 10 MG tablet Commonly known as:  ZYRTEC Take 10 mg by mouth daily as needed for allergies.   estradiol 0.5 MG tablet Commonly known as:  ESTRACE Take 1 tablet (0.5 mg total) by mouth daily.   ibuprofen 800 MG tablet Commonly known as:  ADVIL,MOTRIN Take 1 tablet (800 mg total) by mouth every 8 (eight) hours as needed for mild pain or moderate pain.   lamoTRIgine 200 MG tablet Commonly known as:  LAMICTAL Take 1 tablet (200 mg total) by mouth every morning.   levothyroxine 88 MCG tablet Commonly known as:  SYNTHROID, LEVOTHROID Take 88 mcg by mouth daily before breakfast.   methylphenidate 5 MG tablet Commonly known as:  RITALIN Take 1 tablet (5 mg total) by mouth daily.   montelukast 10 MG tablet Commonly known as:  SINGULAIR Take 1 tablet (10 mg total) by mouth at bedtime.   multivitamin with minerals tablet Take 1 tablet by mouth daily.   omeprazole 20 MG capsule Commonly known as:  PRILOSEC Take 1 capsule (20 mg total) by mouth daily before breakfast.   ondansetron 8 MG disintegrating tablet Commonly known as:  ZOFRAN-ODT Take 1 tablet (8 mg total) by mouth every morning. AND PRN   PROBIOTIC ACIDOPHILUS Caps Take 1 capsule by mouth daily for 10 days.   thyroid 30 MG tablet Commonly known  as:  ARMOUR Take 30 mg by mouth daily before breakfast.   traZODone 100 MG tablet Commonly known as:  DESYREL Take 1 tablet (100 mg total) by mouth at bedtime.   TRINTELLIX 20 MG Tabs tablet Generic drug:  vortioxetine HBr Take 1 tablet (20 mg total) by mouth every morning.      Follow-up Information    Lysle Pearl, Dalyla Chui, DO Follow up in 2 week(s).   Specialty:  Surgery Contact information: [place] [address] [phone]            Total time spent  arranging discharge was >59min. Signed: Benjamine Sprague 02/01/2018, 8:52 AM/c

## 2018-02-01 NOTE — Discharge Instructions (Signed)
Diverticulitis Diverticulitis is when small pockets in your large intestine (colon) get infected or swollen. This causes stomach pain and watery poop (diarrhea). These pouches are called diverticula. They form in people who have a condition called diverticulosis. Follow these instructions at home:    Medicines - tylenol and advil as needed for discomfort.  Please alternate between the two every four hours as needed for pain.   - 325-650mg  every 8hrs to max of 4000mg /24hrs for the tylenol.   - Advil up to 800mg  per dose every 8hrs as needed for pain.    Take over-the-counter and prescription medicines only as told by your doctor. These include: ? Antibiotics. ? Pain medicines. ? Fiber pills. ? Probiotics. ? Stool softeners.  Do not drive or use heavy machinery while taking prescription pain medicine.  If you were prescribed an antibiotic, take it as told. Do not stop taking it even if you feel better. General instructions  Follow a low fiber diet for one week, then switch to high fiber diet with lots of water.  When you feel better, your doctor may tell you to change your diet. You may need to eat a lot of fiber. Fiber makes it easier to poop (have bowel movements). Healthy foods with fiber include: ? Berries. ? Beans. ? Lentils. ? Green vegetables.  Exercise 3 or more times a week. Aim for 30 minutes each time. Exercise enough to sweat and make your heart beat faster.  Keep all follow-up visits as told. This is important. You may need to have an exam of the large intestine. This is called a colonoscopy. Contact a doctor if:  Your pain does not get better.  You have a hard time eating or drinking.  You are not pooping like normal. Get help right away if:  Your pain gets worse.  Your problems do not get better.  Your problems get worse very fast.  You have a fever.  You throw up (vomit) more than one time.  You have poop that  is: ? Bloody. ? Black. ? Tarry. Summary  Diverticulitis is when small pockets in your large intestine (colon) get infected or swollen.  Take medicines only as told by your doctor.  Follow a diet as told by your doctor. This information is not intended to replace advice given to you by your health care provider. Make sure you discuss any questions you have with your health care provider. Document Released: 10/07/2007 Document Revised: 05/07/2016 Document Reviewed: 05/07/2016 Elsevier Interactive Patient Education  2017 [employer].

## 2018-02-05 ENCOUNTER — Other Ambulatory Visit: Payer: Self-pay | Admitting: Internal Medicine

## 2018-02-08 ENCOUNTER — Ambulatory Visit (INDEPENDENT_AMBULATORY_CARE_PROVIDER_SITE_OTHER): Payer: Medicare Other | Admitting: Psychiatry

## 2018-02-08 DIAGNOSIS — F331 Major depressive disorder, recurrent, moderate: Secondary | ICD-10-CM | POA: Diagnosis not present

## 2018-02-08 DIAGNOSIS — I6523 Occlusion and stenosis of bilateral carotid arteries: Secondary | ICD-10-CM | POA: Diagnosis not present

## 2018-02-08 DIAGNOSIS — F900 Attention-deficit hyperactivity disorder, predominantly inattentive type: Secondary | ICD-10-CM | POA: Diagnosis not present

## 2018-02-08 MED ORDER — TRINTELLIX 20 MG PO TABS: 20.0000 mg | ORAL_TABLET | ORAL | 2 refills | Status: DC

## 2018-02-08 MED ORDER — TRAZODONE HCL 100 MG PO TABS: 100.0000 mg | ORAL_TABLET | Freq: Every day | ORAL | 0 refills | Status: DC

## 2018-02-08 MED ORDER — LAMOTRIGINE 200 MG PO TABS: 200.0000 mg | ORAL_TABLET | ORAL | 0 refills | Status: DC

## 2018-02-08 MED ORDER — METHYLPHENIDATE HCL 5 MG PO TABS: ORAL_TABLET | ORAL | 0 refills | Status: DC

## 2018-02-08 NOTE — Progress Notes (Signed)
[facility] MD/PA/NP OP Progress Note  02/08/2018 4:05 PM [name]  MRN:  [id number]  Chief Complaint: I feel tired in the afternoon.  I think I need to go up on my Ritalin.  HPI: Patient came for her follow-up appointment.  She is taking her medication but in the afternoon and early evening she feels very tired and have no energy and motivation to do things.  Overall she describes her mood is good.  She denies any irritability, anger, mania or any crying spells.  Her sleep is good.  Recently she is seen her primary care physician but she was not happy and now she is in a process of changing a new physician.  Patient like to continue Trintellix and trazodone which is helping her mood and depression.  She has no rash, itching, tremors or shakes.  She like her new house.  She denies any feeling of hopelessness or worthlessness.  She has no tremors, shakes or any EPS.  Her appetite is okay.  Visit Diagnosis:    ICD-10-CM   1. Moderate episode of recurrent major depressive disorder (HCC) F33.1 TRINTELLIX 20 MG TABS tablet    traZODone (DESYREL) 100 MG tablet  2. Attention deficit hyperactivity disorder (ADHD), predominantly inattentive type F90.0 methylphenidate (RITALIN) 5 MG tablet    Past Psychiatric History: Reviewed She has 2 psychiatric hospitalization. Patient hadhistory of overdose at age 71 and anger, mania, irritability, impulsive behavior.  She hadpsychological testing and diagnosed with ADD. She took Concerta, Adderall, Wellbutrin, Prozac, lithium, Effexor, Lexapro, Abilify and Cymbalta. She remember the best treatment when she had given a female hormone to help endometriosis. In 2016 she has history of ECT treatment which was failed and then she did [place] which helped her. She took Abilify lithium and Wellbutrin did not work. Vyvanse and Adderall make her more anxious.Patient denies any history of psychosis, hallucination, nightmares or flashback. She denies any history of  physical sexual verbal abuse.  Past Medical History:  Past Medical History:  Diagnosis Date  . Anemia   . Carcinoma ([place])   . Colon cancer ([place]) 01/2017  . Fibromyalgia   . GERD (gastroesophageal reflux disease)   . Hemorrhoids   . Hypothyroidism   . Iron deficiency anemia 03/08/2017  . Major depressive disorder, recurrent episode, mild ([place])   . PONV (postoperative nausea and vomiting)   . Sleep apnea    USES CPAP    Past Surgical History:  Procedure Laterality Date  . ABDOMINAL HYSTERECTOMY  1990  . BREAST BIOPSY Left 2004   benign  . CATARACT EXTRACTION, BILATERAL Bilateral 05/2012  . CHOLECYSTECTOMY  2001  . COLONOSCOPY WITH PROPOFOL N/A 12/28/2016   Procedure: COLONOSCOPY WITH PROPOFOL;  Surgeon: Manya Silvas, MD;  Location[facility] ENDOSCOPY;  Service: Endoscopy;  Laterality: N/A;  . COLONOSCOPY WITH PROPOFOL N/A 01/17/2018   Procedure: COLONOSCOPY WITH PROPOFOL;  Surgeon: Manya Silvas, MD;  Location[facility] ENDOSCOPY;  Service: Endoscopy;  Laterality: N/A;  . ESOPHAGOGASTRODUODENOSCOPY (EGD) WITH PROPOFOL N/A 12/28/2016   Procedure: ESOPHAGOGASTRODUODENOSCOPY (EGD) WITH PROPOFOL;  Surgeon: Manya Silvas, MD;  Location: [facility] ENDOSCOPY;  Service: Endoscopy;  Laterality: N/A;  . LAPAROSCOPIC RIGHT COLECTOMY Right 01/21/2017   Procedure: LAPAROSCOPIC ASSISTED RIGHT COLECTOMY;  Surgeon: Robert Bellow, MD;  Location: [facility] ORS;  Service: General;  Laterality: Right;    Family Psychiatric History: Reviewed  Family History:  Family History  Problem Relation Age of Onset  . Depression Brother   . Alcohol abuse Brother   .  Suicidality Brother   . Stroke Mother   . Clotting disorder Brother   . Colon polyps Father   . Melanoma Sister   . Bipolar disorder Neg Hx     Social History:  Social History   Socioeconomic History  . Marital status: Married    Spouse name: Not on file  . Number of children: Not on file  . Years of education: Not on file  . Highest  education level: Not on file  Occupational History  . Not on file  Social Needs  . Financial resource strain: Not on file  . Food insecurity:    Worry: Not on file    Inability: Not on file  . Transportation needs:    Medical: Not on file    Non-medical: Not on file  Tobacco Use  . Smoking status: Never Smoker  . Smokeless tobacco: Never Used  Substance and Sexual Activity  . Alcohol use: Not Currently    Alcohol/week: 0.0 standard drinks    Comment:    . Drug use: No  . Sexual activity: Yes    Partners: Male    Birth control/protection: None  Lifestyle  . Physical activity:    Days per week: Not on file    Minutes per session: Not on file  . Stress: Not on file  Relationships  . Social connections:    Talks on phone: Not on file    Gets together: Not on file    Attends religious service: Not on file    Active member of club or organization: Not on file    Attends meetings of clubs or organizations: Not on file    Relationship status: Not on file  Other Topics Concern  . Not on file  Social History Narrative  . Not on file    Allergies:  Allergies  Allergen Reactions  . Other     BAND-AIDS IF LEFT ON FOR EXTENDED PERIODS-REDNESS  . Codeine     Nausea    Metabolic Disorder Labs: No results found for: HGBA1C, MPG No results found for: PROLACTIN Lab Results  Component Value Date   CHOL 172 11/25/2017   TRIG 129 11/25/2017   HDL 52 11/25/2017   LDLCALC 94 11/25/2017   Lab Results  Component Value Date   TSH <0.006 (L) 11/25/2017   TSH 0.022 (L) 04/14/2017    Therapeutic Level Labs: No results found for: LITHIUM No results found for: VALPROATE No components found for:  CBMZ  Current Medications: Current Outpatient Medications  Medication Sig Dispense Refill  . acetaminophen (TYLENOL) 325 MG tablet Take 2 tablets (650 mg total) by mouth every 6 (six) hours as needed for mild pain or moderate pain (pain). 40 tablet 0  . amoxicillin-clavulanate  (AUGMENTIN) 875-125 MG tablet Take 1 tablet by mouth every 12 (twelve) hours. 18 tablet 0  . calcium-vitamin D (OSCAL WITH D) 500-200 MG-UNIT tablet Take 2 tablets by mouth 2 (two) times daily.    . cetirizine (ZYRTEC) 10 MG tablet Take 10 mg by mouth daily as needed for allergies.     Marland Kitchen estradiol (ESTRACE) 0.5 MG tablet Take 1 tablet (0.5 mg total) by mouth daily. 90 tablet 4  . ibuprofen (ADVIL,MOTRIN) 800 MG tablet Take 1 tablet (800 mg total) by mouth every 8 (eight) hours as needed for mild pain or moderate pain. 30 tablet 0  . Lactobacillus (PROBIOTIC ACIDOPHILUS) CAPS Take 1 capsule by mouth daily for 10 days. 10 capsule 0  . lamoTRIgine (LAMICTAL) 200  MG tablet Take 1 tablet (200 mg total) by mouth every morning. 90 tablet 0  . levothyroxine (SYNTHROID, LEVOTHROID) 88 MCG tablet Take 88 mcg by mouth daily before breakfast.   2  . methylphenidate (RITALIN) 5 MG tablet Take 1 tablet (5 mg total) by mouth daily. 90 tablet 0  . montelukast (SINGULAIR) 10 MG tablet Take 1 tablet (10 mg total) by mouth at bedtime. (Patient not taking: Reported on 01/29/2018) 30 tablet 3  . Multiple Vitamins-Minerals (MULTIVITAMIN WITH MINERALS) tablet Take 1 tablet by mouth daily.    Marland Kitchen omeprazole (PRILOSEC) 20 MG capsule Take 1 capsule (20 mg total) by mouth daily before breakfast. 90 capsule 4  . ondansetron (ZOFRAN-ODT) 8 MG disintegrating tablet Take 1 tablet (8 mg total) by mouth every morning. AND PRN 90 tablet 4  . thyroid (ARMOUR) 30 MG tablet Take 30 mg by mouth daily before breakfast.    . traZODone (DESYREL) 100 MG tablet Take 1 tablet (100 mg total) by mouth at bedtime. 90 tablet 0  . TRINTELLIX 20 MG TABS tablet Take 1 tablet (20 mg total) by mouth every morning. 30 tablet 2   No current facility-administered medications for this visit.      Musculoskeletal: Strength & Muscle Tone: within normal limits Gait & Station: normal Patient leans: N/A  Psychiatric Specialty Exam: ROS  Blood pressure  126/74, height 5\' 2"  (1.575 m), weight 151 lb (68.5 kg).There is no height or weight on file to calculate BMI.  General Appearance: Casual and tired  Eye Contact:  Good  Speech:  Clear and Coherent  Volume:  Normal  Mood:  Euthymic  Affect:  Appropriate  Thought Process:  Goal Directed  Orientation:  Full (Time, Place, and Person)  Thought Content: Logical   Suicidal Thoughts:  No  Homicidal Thoughts:  No  Memory:  Immediate;   Good Recent;   Good Remote;   Good  Judgement:  Good  Insight:  Good  Psychomotor Activity:  Normal  Concentration:  Concentration: Fair and Attention Span: Fair  Recall:  Good  Fund of Knowledge: Good  Language: Good  Akathisia:  No  Handed:  Right  AIMS (if indicated): not done  Assets:  Communication Skills Desire for Improvement Housing Resilience  ADL's:  Intact  Cognition: WNL  Sleep:  Good   Screenings: AUDIT     Admission (Discharged) from OP Visit from 07/15/2012 in [facility] [facility]  Alcohol Use Disorder Identification Test Final Score (AUDIT)  1    Mini-Mental     Office Visit from 11/24/2017 in [facility], [facility]  Total Score (max 30 points )  27    PHQ2-9     Office Visit from 11/24/2017 in [facility], [facility] Office Visit from 11/11/2017 in [facility], [facility] Office Visit from 06/14/2017 in [facility], [facility]  PHQ-2 Total Score  0  0  0       Assessment and Plan: Major depressive disorder, recurrent.  Attention deficit disorder, inattentive type.  Discussed medication and side effects in detail.  We will try increasing her Ritalin 5 mg in the morning and second at noon time however reminded that if she started to have insomnia, anxiety and heart palpitations then she need to cut back to take only 5 mg.  I will continue Trintellix 20 mg daily, trazodone 100 mg at bedtime and Lamictal 200 mg daily.  She has no rash, itching tremors or shakes.  Patient is not  interested in counseling.  Recommended to call us back if she has any question or any concern.  Follow-up in 3 months.   Kathlee Nations, MD 02/08/2018, 4:05 PM

## 2018-02-08 NOTE — Telephone Encounter (Signed)
Can you check ?

## 2018-02-09 NOTE — Telephone Encounter (Signed)
We can do only 30 day supply with no refills

## 2018-02-10 DIAGNOSIS — K5792 Diverticulitis of intestine, part unspecified, without perforation or abscess without bleeding: Secondary | ICD-10-CM | POA: Diagnosis not present

## 2018-02-14 ENCOUNTER — Other Ambulatory Visit: Payer: Self-pay | Admitting: Surgery

## 2018-02-14 DIAGNOSIS — K5792 Diverticulitis of intestine, part unspecified, without perforation or abscess without bleeding: Secondary | ICD-10-CM | POA: Diagnosis not present

## 2018-02-22 ENCOUNTER — Ambulatory Visit
Admission: RE | Admit: 2018-02-22 | Discharge: 2018-02-22 | Disposition: A | Discharge status: Home / Self Care | Payer: Medicare Other | Source: Ambulatory Visit | Attending: Surgery | Admitting: Surgery

## 2018-02-22 DIAGNOSIS — K5792 Diverticulitis of intestine, part unspecified, without perforation or abscess without bleeding: Secondary | ICD-10-CM

## 2018-02-22 DIAGNOSIS — K5732 Diverticulitis of large intestine without perforation or abscess without bleeding: Secondary | ICD-10-CM | POA: Insufficient documentation

## 2018-02-22 MED ORDER — IOPAMIDOL (ISOVUE-300) INJECTION 61%
100.0000 mL | Freq: Once | INTRAVENOUS | Status: AC | PRN
  Administered 2018-02-22: 100 mL via INTRAVENOUS

## 2018-02-28 DIAGNOSIS — K5720 Diverticulitis of large intestine with perforation and abscess without bleeding: Secondary | ICD-10-CM | POA: Diagnosis not present

## 2018-02-28 DIAGNOSIS — B373 Candidiasis of vulva and vagina: Secondary | ICD-10-CM | POA: Diagnosis not present

## 2018-02-28 DIAGNOSIS — B370 Candidal stomatitis: Secondary | ICD-10-CM | POA: Diagnosis not present

## 2018-03-09 ENCOUNTER — Other Ambulatory Visit: Payer: Self-pay | Admitting: Internal Medicine

## 2018-03-09 NOTE — Telephone Encounter (Signed)
lmom to call us back regarding med  

## 2018-03-22 ENCOUNTER — Encounter: Payer: Self-pay | Admitting: Internal Medicine

## 2018-03-22 ENCOUNTER — Ambulatory Visit (INDEPENDENT_AMBULATORY_CARE_PROVIDER_SITE_OTHER): Payer: Medicare Other | Admitting: Internal Medicine

## 2018-03-22 VITALS — BP 132/68 | HR 72 | Temp 98.4°F | Ht 62.0 in | Wt 155.6 lb

## 2018-03-22 DIAGNOSIS — G4733 Obstructive sleep apnea (adult) (pediatric): Secondary | ICD-10-CM

## 2018-03-22 DIAGNOSIS — F331 Major depressive disorder, recurrent, moderate: Secondary | ICD-10-CM | POA: Diagnosis not present

## 2018-03-22 DIAGNOSIS — M797 Fibromyalgia: Secondary | ICD-10-CM | POA: Insufficient documentation

## 2018-03-22 DIAGNOSIS — Z85828 Personal history of other malignant neoplasm of skin: Secondary | ICD-10-CM

## 2018-03-22 DIAGNOSIS — F332 Major depressive disorder, recurrent severe without psychotic features: Secondary | ICD-10-CM | POA: Diagnosis not present

## 2018-03-22 DIAGNOSIS — E559 Vitamin D deficiency, unspecified: Secondary | ICD-10-CM

## 2018-03-22 DIAGNOSIS — N6310 Unspecified lump in the right breast, unspecified quadrant: Secondary | ICD-10-CM

## 2018-03-22 DIAGNOSIS — N959 Unspecified menopausal and perimenopausal disorder: Secondary | ICD-10-CM | POA: Diagnosis not present

## 2018-03-22 DIAGNOSIS — R5383 Other fatigue: Secondary | ICD-10-CM

## 2018-03-22 DIAGNOSIS — E039 Hypothyroidism, unspecified: Secondary | ICD-10-CM | POA: Insufficient documentation

## 2018-03-22 DIAGNOSIS — F900 Attention-deficit hyperactivity disorder, predominantly inattentive type: Secondary | ICD-10-CM

## 2018-03-22 DIAGNOSIS — R195 Other fecal abnormalities: Secondary | ICD-10-CM | POA: Diagnosis not present

## 2018-03-22 DIAGNOSIS — K5720 Diverticulitis of large intestine with perforation and abscess without bleeding: Secondary | ICD-10-CM

## 2018-03-22 DIAGNOSIS — Z9989 Dependence on other enabling machines and devices: Secondary | ICD-10-CM

## 2018-03-22 DIAGNOSIS — K219 Gastro-esophageal reflux disease without esophagitis: Secondary | ICD-10-CM

## 2018-03-22 DIAGNOSIS — Z85038 Personal history of other malignant neoplasm of large intestine: Secondary | ICD-10-CM | POA: Diagnosis not present

## 2018-03-22 DIAGNOSIS — R634 Abnormal weight loss: Secondary | ICD-10-CM

## 2018-03-22 DIAGNOSIS — R112 Nausea with vomiting, unspecified: Secondary | ICD-10-CM | POA: Diagnosis not present

## 2018-03-22 DIAGNOSIS — R110 Nausea: Secondary | ICD-10-CM

## 2018-03-22 DIAGNOSIS — K5792 Diverticulitis of intestine, part unspecified, without perforation or abscess without bleeding: Secondary | ICD-10-CM | POA: Diagnosis not present

## 2018-03-22 DIAGNOSIS — K529 Noninfective gastroenteritis and colitis, unspecified: Secondary | ICD-10-CM | POA: Insufficient documentation

## 2018-03-22 DIAGNOSIS — R319 Hematuria, unspecified: Secondary | ICD-10-CM | POA: Diagnosis not present

## 2018-03-22 LAB — TSH: TSH: 0.01 u[IU]/mL — ABNORMAL LOW (ref 0.35–4.50)

## 2018-03-22 LAB — VITAMIN D 25 HYDROXY (VIT D DEFICIENCY, FRACTURES): VITD: 46.39 ng/mL (ref 30.00–100.00)

## 2018-03-22 MED ORDER — ESTRADIOL 0.5 MG PO TABS: 0.5000 mg | ORAL_TABLET | Freq: Every day | ORAL | 3 refills | Status: DC

## 2018-03-22 MED ORDER — TRAZODONE HCL 50 MG PO TABS: 50.0000 mg | ORAL_TABLET | Freq: Every day | ORAL | Status: DC

## 2018-03-22 MED ORDER — OMEPRAZOLE 20 MG PO CPDR: 20.0000 mg | DELAYED_RELEASE_CAPSULE | Freq: Every day | ORAL | 3 refills | Status: DC

## 2018-03-22 MED ORDER — ONDANSETRON 4 MG PO TBDP: 4.0000 mg | ORAL_TABLET | Freq: Two times a day (BID) | ORAL | 0 refills | Status: DC | PRN

## 2018-03-22 NOTE — Progress Notes (Signed)
Pre visit review using our clinic review tool, if applicable. No additional management support is needed unless otherwise documented below in the visit note. 

## 2018-03-22 NOTE — Patient Instructions (Addendum)
Please check with [name] with pneumonia 23 vaccine  Ask if they have shingrix vaccine   Stop Armour thyroid 30 mg daily only take levothyroxine 88 daily for now  Repeat TSH in 6-8 weeks schedule at check out  [place] Turn stool samples into hospital lab    Nausea and Vomiting, Adult Nausea is the feeling that you have an upset stomach or have to vomit. As nausea gets worse, it can lead to vomiting. Vomiting occurs when stomach contents are thrown up and out of the mouth. Vomiting can make you feel weak and cause you to become dehydrated. Dehydration can make you tired and thirsty, cause you to have a dry mouth, and decrease how often you urinate. Older adults and people with other diseases or a weak immune system are at higher risk for dehydration. It is important to treat your nausea and vomiting as told by your health care provider. Follow these instructions at home: Follow instructions from your health care provider about how to care for yourself at home. Eating and drinking Follow these recommendations as told by your health care provider:  Take an oral rehydration solution (ORS). This is a drink that is sold at pharmacies and retail stores.  Drink clear fluids in small amounts as you are able. Clear fluids include water, ice chips, diluted fruit juice, and low-calorie sports drinks.  Eat bland, easy-to-digest foods in small amounts as you are able. These foods include bananas, applesauce, rice, lean meats, toast, and crackers.  Avoid fluids that contain a lot of sugar or caffeine, such as energy drinks, sports drinks, and soda.  Avoid alcohol.  Avoid spicy or fatty foods.  General instructions  Drink enough fluid to keep your urine clear or pale yellow.  Wash your hands often. If soap and water are not available, use hand sanitizer.  Make sure that all people in your household wash their hands well and often.  Take over-the-counter and prescription medicines  only as told by your health care provider.  Rest at home while you recover.  Watch your condition for any changes.  Breathe slowly and deeply when you feel nauseated.  Keep all follow-up visits as told by your health care provider. This is important. Contact a health care provider if:  You have a fever.  You cannot keep fluids down.  Your symptoms get worse.  You have new symptoms.  Your nausea does not go away after two days.  You feel light-headed or dizzy.  You have a headache.  You have muscle cramps. Get help right away if:  You have pain in your chest, neck, arm, or jaw.  You feel extremely weak or you faint.  You have persistent vomiting.  You see blood in your vomit.  Your vomit looks like black coffee grounds.  You have bloody or black stools or stools that look like tar.  You have a severe headache, a stiff neck, or both.  You have a rash.  You have severe pain, cramping, or bloating in your abdomen.  You have trouble breathing or you are breathing very quickly.  Your heart is beating very quickly.  Your skin feels cold and clammy.  You feel confused.  You have pain when you urinate.  You have signs of dehydration, such as: ? Dark urine, very little urine, or no urine. ? Cracked lips. ? Dry mouth. ? Sunken eyes. ? Sleepiness. ? Weakness. These symptoms may represent a serious problem that is an emergency. Do not  wait to see if the symptoms will go away. Get medical help right away. Call your local emergency services (911 in the [place] Do not drive yourself to the hospital. This information is not intended to replace advice given to you by your health care provider. Make sure you discuss any questions you have with your health care provider. Document Released: 04/20/2005 Document Revised: 09/23/2015 Document Reviewed: 12/25/2014 Elsevier Interactive Patient Education  2018 [place] Zoster (Shingles) Vaccine, RZV: What  You Need to Know 1. Why get vaccinated? Shingles (also called herpes zoster, or just zoster) is a painful skin rash, often with blisters. Shingles is caused by the varicella zoster virus, the same virus that causes chickenpox. After you have chickenpox, the virus stays in your body and can cause shingles later in life. You can't catch shingles from another person. However, a person who has never had chickenpox (or chickenpox vaccine) could get chickenpox from someone with shingles. A shingles rash usually appears on one side of the face or body and heals within 2 to 4 weeks. Its main symptom is pain, which can be severe. Other symptoms can include fever, headache, chills and upset stomach. Very rarely, a shingles infection can lead to pneumonia, hearing problems, blindness, brain inflammation (encephalitis), or death. For about 1 person in 5, severe pain can continue even long after the rash has cleared up. This long-lasting pain is called post-herpetic neuralgia (PHN). Shingles is far more common in people 101 years of age and older than in younger people, and the risk increases with age. It is also more common in people whose immune system is weakened because of a disease such as cancer, or by drugs such as steroids or chemotherapy. At least 1 million people a year in the [place] get shingles. 2. Shingles vaccine (recombinant) Recombinant shingles vaccine was approved by FDA in 2017 for the prevention of shingles. In clinical trials, it was more than 90% effective in preventing shingles. It can also reduce the likelihood of PHN. Two doses, 2 to 6 months apart, are recommended for adults 69 and older. This vaccine is also recommended for people who have already gotten the live shingles vaccine (Zostavax). There is no live virus in this vaccine. 3. Some people should not get this vaccine Tell your vaccine provider if you:  Have any severe, life-threatening allergies. A person who has ever had a  life-threatening allergic reaction after a dose of recombinant shingles vaccine, or has a severe allergy to any component of this vaccine, may be advised not to be vaccinated. Ask your health care provider if you want information about vaccine components.  Are pregnant or breastfeeding. There is not much information about use of recombinant shingles vaccine in pregnant or nursing women. Your healthcare provider might recommend delaying vaccination.  Are not feeling well. If you have a mild illness, such as a cold, you can probably get the vaccine today. If you are moderately or severely ill, you should probably wait until you recover. Your doctor can advise you.  4. Risks of a vaccine reaction With any medicine, including vaccines, there is a chance of reactions. After recombinant shingles vaccination, a person might experience:  Pain, redness, soreness, or swelling at the site of the injection  Headache, muscle aches, fever, shivering, fatigue  In clinical trials, most people got a sore arm with mild or moderate pain after vaccination, and some also had redness and swelling where they got the shot. Some people felt  tired, had muscle pain, a headache, shivering, fever, stomach pain, or nausea. About 1 out of 6 people who got recombinant zoster vaccine experienced side effects that prevented them from doing regular activities. Symptoms went away on their own in about 2 to 3 days. Side effects were more common in younger people. You should still get the second dose of recombinant zoster vaccine even if you had one of these reactions after the first dose. Other things that could happen after this vaccine:  People sometimes faint after medical procedures, including vaccination. Sitting or lying down for about 15 minutes can help prevent fainting and injuries caused by a fall. Tell your provider if you feel dizzy or have vision changes or ringing in the ears.  Some people get shoulder pain that can be  more severe and longer-lasting than routine soreness that can follow injections. This happens very rarely.  Any medication can cause a severe allergic reaction. Such reactions to a vaccine are estimated at about 1 in a million doses, and would happen within a few minutes to a few hours after the vaccination. As with any medicine, there is a very remote chance of a vaccine causing a serious injury or death. The safety of vaccines is always being monitored. For more information, visit: http://www.aguilar.org/ 5. What if there is a serious problem? What should I look for?  Look for anything that concerns you, such as signs of a severe allergic reaction, very high fever, or unusual behavior. Signs of a severe allergic reaction can include hives, swelling of the face and throat, difficulty breathing, a fast heartbeat, dizziness, and weakness. These would usually start a few minutes to a few hours after the vaccination. What should I do?  If you think it is a severe allergic reaction or other emergency that can't wait, call 9-1-1 and get to the nearest hospital. Otherwise, call your health care provider. Afterward, the reaction should be reported to the Vaccine Adverse Event Reporting System (VAERS). Your doctor should file this report, or you can do it yourself through the VAERS web site atwww.vaers.https://www.bray.com/ by calling (406)791-5560. VAERS does not give medical advice. 6. How can I learn more?  Ask your healthcare provider. He or she can give you the vaccine package insert or suggest other sources of information.  Call your local or state health department.  Contact the Centers for Disease Control and Prevention (CDC): ? Call 806-561-2369 (1-800-CDC-INFO) or ? Visit the CDC's website at http://hunter.com/ CDC Vaccine Information Statement (VIS) Recombinant Zoster Vaccine (06/15/2016) This information is not intended to replace advice given to you by your health care provider. Make sure  you discuss any questions you have with your health care provider. Document Released: 06/30/2016 Document Revised: 06/30/2016 Document Reviewed: 06/30/2016 Elsevier Interactive Patient Education  [employer].

## 2018-03-23 LAB — URINALYSIS, ROUTINE W REFLEX MICROSCOPIC
Bilirubin Urine: NEGATIVE
Glucose, UA: NEGATIVE
Hgb urine dipstick: NEGATIVE
Ketones, ur: NEGATIVE
Leukocytes, UA: NEGATIVE
NITRITE: NEGATIVE
PROTEIN: NEGATIVE
Specific Gravity, Urine: 1.012 (ref 1.001–1.03)
pH: 7 (ref 5.0–8.0)

## 2018-03-23 LAB — URINE CULTURE
MICRO NUMBER: 91392675
SPECIMEN QUALITY:: ADEQUATE

## 2018-03-25 ENCOUNTER — Telehealth: Payer: Self-pay | Admitting: Internal Medicine

## 2018-03-25 ENCOUNTER — Other Ambulatory Visit: Payer: Self-pay

## 2018-03-25 MED ORDER — LEVOTHYROXINE SODIUM 88 MCG PO TABS: ORAL_TABLET | ORAL | 0 refills | Status: DC

## 2018-03-25 NOTE — Telephone Encounter (Signed)
I have refilled & sent to [facility].

## 2018-03-25 NOTE — Telephone Encounter (Signed)
Copied from [name] [id number]. Topic: Quick Communication - See Telephone Encounter >> Mar 25, 2018 11:23 AM Conception Chancy, NT wrote: CRM for notification. See Telephone encounter for: 03/25/18.  Patient is calling and states she is needing a refill on levothyroxine (SYNTHROID, LEVOTHROID) 88 MCG tablet.  [place], [place] [address] Phone: [phone] Fax: [phone]

## 2018-03-28 ENCOUNTER — Other Ambulatory Visit
Admission: RE | Admit: 2018-03-28 | Discharge: 2018-03-28 | Disposition: A | Discharge status: Home / Self Care | Payer: Medicare Other | Source: Ambulatory Visit | Attending: Internal Medicine | Admitting: Internal Medicine

## 2018-03-28 DIAGNOSIS — R195 Other fecal abnormalities: Secondary | ICD-10-CM | POA: Diagnosis not present

## 2018-03-28 DIAGNOSIS — R112 Nausea with vomiting, unspecified: Secondary | ICD-10-CM | POA: Insufficient documentation

## 2018-03-28 DIAGNOSIS — Z85038 Personal history of other malignant neoplasm of large intestine: Secondary | ICD-10-CM | POA: Insufficient documentation

## 2018-03-28 DIAGNOSIS — K5792 Diverticulitis of intestine, part unspecified, without perforation or abscess without bleeding: Secondary | ICD-10-CM | POA: Insufficient documentation

## 2018-03-28 LAB — GASTROINTESTINAL PANEL BY PCR, STOOL (REPLACES STOOL CULTURE)

## 2018-03-28 LAB — C DIFFICILE QUICK SCREEN W PCR REFLEX
C Diff antigen: NEGATIVE
C Diff interpretation: NOT DETECTED
C Diff toxin: NEGATIVE

## 2018-03-30 ENCOUNTER — Encounter: Payer: Self-pay | Admitting: Internal Medicine

## 2018-03-30 NOTE — Progress Notes (Addendum)
Chief Complaint  Patient presents with  . Establish Care   New patient  1. Hypothyroidism with low TSH on levo 88 mcg and armour thyroid  2. Chronic nausea and diarrhea  And diverticulitis hosp 01/2018 x 3 days with Abx ? Etiology. Chronic nausea Noted since colon cancer per pt w/in the last year on zofran 8 mg prn chronically  3. OSA on cpap with hypoxia qhs and noctural O2  4. H/o colon cancer s/p resection 01/2017  5. C/o fatigue and weight loss 30 lbs over 1 year since colon cancer dx  6. H/o depression f/u psych in [place]   Review of Systems  Constitutional: Positive for malaise/fatigue and weight loss.  HENT: Negative for hearing loss.   Eyes: Negative for blurred vision.  Respiratory: Negative for shortness of breath.   Cardiovascular: Negative for chest pain.  Gastrointestinal: Positive for diarrhea and nausea. Negative for abdominal pain.  Musculoskeletal: Negative for falls.  Skin: Negative for rash.  Neurological: Negative for headaches.  Psychiatric/Behavioral: Positive for depression. Negative for memory loss.   Past Medical History:  Diagnosis Date  . Anemia   . Carcinoma ([place])   . Colon cancer ([place]) 01/2017  . Fibromyalgia   . GERD (gastroesophageal reflux disease)   . Hemorrhoids   . Hypothyroidism   . Iron deficiency anemia 03/08/2017  . Major depressive disorder, recurrent episode, mild ([place])   . PONV (postoperative nausea and vomiting)   . Sleep apnea    USES CPAP   Past Surgical History:  Procedure Laterality Date  . ABDOMINAL HYSTERECTOMY  1990  . BREAST BIOPSY Left 2004   benign  . CATARACT EXTRACTION, BILATERAL Bilateral 05/2012  . CHOLECYSTECTOMY  2001  . COLONOSCOPY WITH PROPOFOL N/A 12/28/2016   Procedure: COLONOSCOPY WITH PROPOFOL;  Surgeon: Manya Silvas, MD;  Location: [facility] ENDOSCOPY;  Service: Endoscopy;  Laterality: N/A;  . COLONOSCOPY WITH PROPOFOL N/A 01/17/2018   Procedure: COLONOSCOPY WITH PROPOFOL;  Surgeon: Manya Silvas, MD;   Location: [facility] ENDOSCOPY;  Service: Endoscopy;  Laterality: N/A;  . ESOPHAGOGASTRODUODENOSCOPY (EGD) WITH PROPOFOL N/A 12/28/2016   Procedure: ESOPHAGOGASTRODUODENOSCOPY (EGD) WITH PROPOFOL;  Surgeon: Manya Silvas, MD;  Location: [facility] ENDOSCOPY;  Service: Endoscopy;  Laterality: N/A;  . LAPAROSCOPIC RIGHT COLECTOMY Right 01/21/2017   Procedure: LAPAROSCOPIC ASSISTED RIGHT COLECTOMY;  Surgeon: Robert Bellow, MD;  Location: [facility] ORS;  Service: General;  Laterality: Right;   Family History  Problem Relation Age of Onset  . Depression Brother   . Alcohol abuse Brother   . Suicidality Brother   . Stroke Mother   . Clotting disorder Brother   . Colon polyps Father   . Melanoma Sister   . Bipolar disorder Neg Hx    Social History   Socioeconomic History  . Marital status: Married    Spouse name: Not on file  . Number of children: Not on file  . Years of education: Not on file  . Highest education level: Not on file  Occupational History  . Not on file  Social Needs  . Financial resource strain: Not on file  . Food insecurity:    Worry: Not on file    Inability: Not on file  . Transportation needs:    Medical: Not on file    Non-medical: Not on file  Tobacco Use  . Smoking status: Never Smoker  . Smokeless tobacco: Never Used  Substance and Sexual Activity  . Alcohol use: Not Currently    Alcohol/week: 0.0 standard drinks  Comment:    . Drug use: No  . Sexual activity: Yes    Partners: Male    Birth control/protection: None  Lifestyle  . Physical activity:    Days per week: Not on file    Minutes per session: Not on file  . Stress: Not on file  Relationships  . Social connections:    Talks on phone: Not on file    Gets together: Not on file    Attends religious service: Not on file    Active member of club or organization: Not on file    Attends meetings of clubs or organizations: Not on file    Relationship status: Not on file  . Intimate partner  violence:    Fear of current or ex partner: Not on file    Emotionally abused: Not on file    Physically abused: Not on file    Forced sexual activity: Not on file  Other Topics Concern  . Not on file  Social History Narrative  . Not on file   Current Meds  Medication Sig  . amoxicillin-clavulanate (AUGMENTIN) 875-125 MG tablet Take 1 tablet by mouth every 12 (twelve) hours.  . calcium-vitamin D (OSCAL WITH D) 500-200 MG-UNIT tablet Take 2 tablets by mouth 2 (two) times daily.  . cetirizine (ZYRTEC) 10 MG tablet Take 10 mg by mouth daily as needed for allergies.   Marland Kitchen estradiol (ESTRACE) 0.5 MG tablet Take 1 tablet (0.5 mg total) by mouth daily.  Marland Kitchen lamoTRIgine (LAMICTAL) 200 MG tablet Take 1 tablet (200 mg total) by mouth every morning.  . methylphenidate (RITALIN) 5 MG tablet Take one in am and 2nd at noon  . montelukast (SINGULAIR) 10 MG tablet Take 1 tablet (10 mg total) by mouth at bedtime.  . Multiple Vitamins-Minerals (MULTIVITAMIN WITH MINERALS) tablet Take 1 tablet by mouth daily.  Marland Kitchen omeprazole (PRILOSEC) 20 MG capsule Take 1 capsule (20 mg total) by mouth daily before breakfast.  . ondansetron (ZOFRAN-ODT) 4 MG disintegrating tablet Take 1 tablet (4 mg total) by mouth 2 (two) times daily as needed for nausea or vomiting. AND PRN  . traZODone (DESYREL) 50 MG tablet Take 1 tablet (50 mg total) by mouth at bedtime.  . TRINTELLIX 20 MG TABS tablet Take 1 tablet (20 mg total) by mouth every morning.  . [DISCONTINUED] ARMOUR THYROID 30 MG tablet TAKE ONE TABLET BY MOUTH EVERY MORNING BEFORE BREAKFAST  . [DISCONTINUED] estradiol (ESTRACE) 0.5 MG tablet Take 1 tablet (0.5 mg total) by mouth daily.  . [DISCONTINUED] ibuprofen (ADVIL,MOTRIN) 800 MG tablet Take 1 tablet (800 mg total) by mouth every 8 (eight) hours as needed for mild pain or moderate pain.  . [DISCONTINUED] levothyroxine (SYNTHROID, LEVOTHROID) 88 MCG tablet TAKE ONE TABLET BY MOUTH EVERY MORNING ON AN EMPTY STOMACH  .  [DISCONTINUED] omeprazole (PRILOSEC) 20 MG capsule Take 1 capsule (20 mg total) by mouth daily before breakfast.  . [DISCONTINUED] ondansetron (ZOFRAN-ODT) 8 MG disintegrating tablet Take 1 tablet (8 mg total) by mouth every morning. AND PRN  . [DISCONTINUED] traZODone (DESYREL) 100 MG tablet Take 1 tablet (100 mg total) by mouth at bedtime.   Allergies  Allergen Reactions  . Other     BAND-AIDS IF LEFT ON FOR EXTENDED PERIODS-REDNESS  . Codeine     Nausea   Recent Results (from the past 2160 hour(s))  I-STAT creatinine     Status: None   Collection Time: 01/11/18 11:15 AM  Result Value Ref Range   Creatinine,  Ser 0.90 0.44 - 1.00 mg/dL  CEA     Status: None   Collection Time: 01/14/18 12:53 PM  Result Value Ref Range   CEA 1.2 0.0 - 4.7 ng/mL    Comment: (NOTE)                             Nonsmokers          <3.9                             Smokers             <5.6 Roche [employer] Electrochemiluminescence Immunoassay (ECLIA) Values obtained with different assay methods or kits cannot be used interchangeably.  Results cannot be interpreted as absolute evidence of the presence or absence of malignant disease. Performed At: [facility] [place], [place] [id number] Rush Farmer MD QM:[id number]   Comprehensive metabolic panel     Status: Abnormal   Collection Time: 01/14/18 12:53 PM  Result Value Ref Range   Sodium 139 135 - 145 mmol/L   Potassium 4.2 3.5 - 5.1 mmol/L   Chloride 104 98 - 111 mmol/L   CO2 29 22 - 32 mmol/L   Glucose, Bld 103 (H) 70 - 99 mg/dL   BUN 14 8 - 23 mg/dL   Creatinine, Ser 1.00 0.44 - 1.00 mg/dL   Calcium 9.7 8.9 - 10.3 mg/dL   Total Protein 7.0 6.5 - 8.1 g/dL   Albumin 4.1 3.5 - 5.0 g/dL   AST 17 15 - 41 U/L   ALT 17 0 - 44 U/L   Alkaline Phosphatase 67 38 - 126 U/L   Total Bilirubin 0.6 0.3 - 1.2 mg/dL   GFR calc non Af Amer 56 (L) >60 mL/min   GFR calc Af Amer >60 >60 mL/min    Comment: (NOTE) The eGFR has been  calculated using the CKD EPI equation. This calculation has not been validated in all clinical situations. eGFR's persistently <60 mL/min signify possible Chronic Kidney Disease.    Anion gap 6 5 - 15    Comment: Performed at [facility], [address] [place], [place] [address]  CBC with Differential/Platelet     Status: None   Collection Time: 01/14/18 12:53 PM  Result Value Ref Range   WBC 8.2 3.6 - 11.0 K/uL   RBC 4.45 3.80 - 5.20 MIL/uL   Hemoglobin 13.3 12.0 - 16.0 g/dL   HCT 39.6 35.0 - 47.0 %   MCV 89.0 80.0 - 100.0 fL   MCH 29.9 26.0 - 34.0 pg   MCHC 33.6 32.0 - 36.0 g/dL   RDW 13.4 11.5 - 14.5 %   Platelets 283 150 - 440 K/uL   Neutrophils Relative % 52 %   Neutro Abs 4.3 1.4 - 6.5 K/uL   Lymphocytes Relative 42 %   Lymphs Abs 3.5 1.0 - 3.6 K/uL   Monocytes Relative 5 %   Monocytes Absolute 0.4 0.2 - 0.9 K/uL   Eosinophils Relative 1 %   Eosinophils Absolute 0.0 0 - 0.7 K/uL   Basophils Relative 0 %   Basophils Absolute 0.0 0 - 0.1 K/uL    Comment: Performed at [facility], [address] [place], [place] [address]  Lipase, blood     Status: None   Collection Time: 01/29/18 11:10 AM  Result Value Ref Range   Lipase 23  11 - 51 U/L    Comment: Performed at [facility], [address] [place], [place] [address]  Comprehensive metabolic panel     Status: Abnormal   Collection Time: 01/29/18 11:10 AM  Result Value Ref Range   Sodium 139 135 - 145 mmol/L   Potassium 3.8 3.5 - 5.1 mmol/L   Chloride 102 98 - 111 mmol/L   CO2 29 22 - 32 mmol/L   Glucose, Bld 102 (H) 70 - 99 mg/dL   BUN 13 8 - 23 mg/dL   Creatinine, Ser 0.88 0.44 - 1.00 mg/dL   Calcium 9.3 8.9 - 10.3 mg/dL   Total Protein 6.9 6.5 - 8.1 g/dL   Albumin 3.7 3.5 - 5.0 g/dL   AST 12 (L) 15 - 41 U/L   ALT 12 0 - 44 U/L   Alkaline Phosphatase 70 38 - 126 U/L   Total Bilirubin 0.6 0.3 - 1.2 mg/dL   GFR calc non Af Amer >60 >60 mL/min   GFR calc Af Amer >60 >60 mL/min     Comment: (NOTE) The eGFR has been calculated using the CKD EPI equation. This calculation has not been validated in all clinical situations. eGFR's persistently <60 mL/min signify possible Chronic Kidney Disease.    Anion gap 8 5 - 15    Comment: Performed at [facility], [address] [place], [place] [address]  CBC     Status: Abnormal   Collection Time: 01/29/18 11:10 AM  Result Value Ref Range   WBC 17.2 (H) 3.6 - 11.0 K/uL   RBC 4.05 3.80 - 5.20 MIL/uL   Hemoglobin 12.6 12.0 - 16.0 g/dL   HCT 36.0 35.0 - 47.0 %   MCV 89.0 80.0 - 100.0 fL   MCH 31.2 26.0 - 34.0 pg   MCHC 35.1 32.0 - 36.0 g/dL   RDW 13.1 11.5 - 14.5 %   Platelets 327 150 - 440 K/uL    Comment: Performed at [facility], [address] [place], [place] [address]  Urinalysis, Complete w Microscopic     Status: Abnormal   Collection Time: 01/29/18 11:28 AM  Result Value Ref Range   Color, Urine YELLOW (A) YELLOW   APPearance HAZY (A) CLEAR   Specific Gravity, Urine 1.011 1.005 - 1.030   pH 6.0 5.0 - 8.0   Glucose, UA NEGATIVE NEGATIVE mg/dL   Hgb urine dipstick SMALL (A) NEGATIVE   Bilirubin Urine NEGATIVE NEGATIVE   Ketones, ur NEGATIVE NEGATIVE mg/dL   Protein, ur NEGATIVE NEGATIVE mg/dL   Nitrite NEGATIVE NEGATIVE   Leukocytes, UA NEGATIVE NEGATIVE   RBC / HPF 0-5 0 - 5 RBC/hpf   WBC, UA 0-5 0 - 5 WBC/hpf   Bacteria, UA NONE SEEN NONE SEEN   Squamous Epithelial / LPF 11-20 0 - 5    Comment: Performed at [facility], [address] [place],  [address]  Basic metabolic panel     Status: Abnormal   Collection Time: 01/30/18  6:24 AM  Result Value Ref Range   Sodium 140 135 - 145 mmol/L   Potassium 4.0 3.5 - 5.1 mmol/L   Chloride 106 98 - 111 mmol/L   CO2 24 22 - 32 mmol/L   Glucose, Bld 97 70 - 99 mg/dL   BUN 12 8 - 23 mg/dL   Creatinine, Ser 0.85 0.44 - 1.00 mg/dL   Calcium 8.6 (L) 8.9 - 10.3 mg/dL   GFR calc non Af Amer >60 >60 mL/min  GFR calc Af Amer  >60 >60 mL/min    Comment: (NOTE) The eGFR has been calculated using the CKD EPI equation. This calculation has not been validated in all clinical situations. eGFR's persistently <60 mL/min signify possible Chronic Kidney Disease.    Anion gap 10 5 - 15    Comment: Performed at [facility], [address] [place], [place] [address]  Magnesium     Status: None   Collection Time: 01/30/18  6:24 AM  Result Value Ref Range   Magnesium 2.2 1.7 - 2.4 mg/dL    Comment: Performed at [facility], [address] [place], [place] [address]  Phosphorus     Status: None   Collection Time: 01/30/18  6:24 AM  Result Value Ref Range   Phosphorus 3.7 2.5 - 4.6 mg/dL    Comment: Performed at [facility], [address] [place], [place] [address]  CBC     Status: Abnormal   Collection Time: 01/30/18  6:24 AM  Result Value Ref Range   WBC 10.4 3.6 - 11.0 K/uL   RBC 3.49 (L) 3.80 - 5.20 MIL/uL   Hemoglobin 11.0 (L) 12.0 - 16.0 g/dL   HCT 31.1 (L) 35.0 - 47.0 %   MCV 89.1 80.0 - 100.0 fL   MCH 31.7 26.0 - 34.0 pg   MCHC 35.5 32.0 - 36.0 g/dL   RDW 13.1 11.5 - 14.5 %   Platelets 291 150 - 440 K/uL    Comment: Performed at [facility], [address] [place], [place] [address]  HIV Antibody (routine testing w rflx)     Status: None   Collection Time: 01/30/18  6:24 AM  Result Value Ref Range   HIV Screen 4th Generation wRfx Non Reactive Non Reactive    Comment: (NOTE) Performed At: [facility] [place], [place] [id number] Rush Farmer MD SE:[id number]   Basic metabolic panel     Status: Abnormal   Collection Time: 01/31/18  5:48 AM  Result Value Ref Range   Sodium 142 135 - 145 mmol/L   Potassium 4.1 3.5 - 5.1 mmol/L   Chloride 108 98 - 111 mmol/L   CO2 27 22 - 32 mmol/L   Glucose, Bld 107 (H) 70 - 99 mg/dL   BUN 10 8 - 23 mg/dL   Creatinine, Ser 0.97 0.44 - 1.00 mg/dL   Calcium 8.9 8.9 - 10.3 mg/dL   GFR calc non Af  Amer 58 (L) >60 mL/min   GFR calc Af Amer >60 >60 mL/min    Comment: (NOTE) The eGFR has been calculated using the CKD EPI equation. This calculation has not been validated in all clinical situations. eGFR's persistently <60 mL/min signify possible Chronic Kidney Disease.    Anion gap 7 5 - 15    Comment: Performed at [facility], [address] [place], [place] [address]  Magnesium     Status: None   Collection Time: 01/31/18  5:48 AM  Result Value Ref Range   Magnesium 2.3 1.7 - 2.4 mg/dL    Comment: Performed at [facility], [address] [place], [place] [address]  Phosphorus     Status: None   Collection Time: 01/31/18  5:48 AM  Result Value Ref Range   Phosphorus 3.1 2.5 - 4.6 mg/dL    Comment: Performed at [facility], [address] [place], [place] [address]  CBC     Status: Abnormal   Collection Time: 01/31/18  5:48 AM  Result Value Ref Range   WBC 7.4 3.6 - 11.0 K/uL   RBC 3.77 (L) 3.80 - 5.20 MIL/uL   Hemoglobin 11.9 (L) 12.0 - 16.0 g/dL   HCT 34.0 (L) 35.0 - 47.0 %   MCV 90.1 80.0 - 100.0 fL   MCH 31.6 26.0 - 34.0 pg   MCHC 35.1 32.0 - 36.0 g/dL   RDW 13.1 11.5 - 14.5 %   Platelets 308 150 - 440 K/uL    Comment: Performed at [facility], [address] [place], [place] [address]  Basic metabolic panel     Status: Abnormal   Collection Time: 02/01/18  3:21 AM  Result Value Ref Range   Sodium 141 135 - 145 mmol/L   Potassium 3.8 3.5 - 5.1 mmol/L   Chloride 107 98 - 111 mmol/L   CO2 27 22 - 32 mmol/L   Glucose, Bld 89 70 - 99 mg/dL   BUN 11 8 - 23 mg/dL   Creatinine, Ser 1.03 (H) 0.44 - 1.00 mg/dL   Calcium 8.8 (L) 8.9 - 10.3 mg/dL   GFR calc non Af Amer 54 (L) >60 mL/min   GFR calc Af Amer >60 >60 mL/min    Comment: (NOTE) The eGFR has been calculated using the CKD EPI equation. This calculation has not been validated in all clinical situations. eGFR's persistently <60 mL/min signify possible Chronic  Kidney Disease.    Anion gap 7 5 - 15    Comment: Performed at [facility], [address] [place], [place] [address]  Magnesium     Status: None   Collection Time: 02/01/18  3:21 AM  Result Value Ref Range   Magnesium 2.2 1.7 - 2.4 mg/dL    Comment: Performed at [facility], [address] [place], [place] [address]  Phosphorus     Status: None   Collection Time: 02/01/18  3:21 AM  Result Value Ref Range   Phosphorus 3.9 2.5 - 4.6 mg/dL    Comment: Performed at [facility], [address] [place], [place] [address]  CBC     Status: None   Collection Time: 02/01/18  3:21 AM  Result Value Ref Range   WBC 6.9 3.6 - 11.0 K/uL   RBC 3.95 3.80 - 5.20 MIL/uL   Hemoglobin 12.2 12.0 - 16.0 g/dL   HCT 35.3 35.0 - 47.0 %   MCV 89.5 80.0 - 100.0 fL   MCH 31.0 26.0 - 34.0 pg   MCHC 34.6 32.0 - 36.0 g/dL   RDW 13.2 11.5 - 14.5 %   Platelets 344 150 - 440 K/uL    Comment: Performed at [facility], [address] [place], [place] [address]  Urinalysis, Routine w reflex microscopic     Status: None   Collection Time: 03/22/18  1:57 PM  Result Value Ref Range   Color, Urine YELLOW YELLOW   APPearance CLEAR CLEAR   Specific Gravity, Urine 1.012 1.001 - 1.03   pH 7.0 5.0 - 8.0   Glucose, UA NEGATIVE NEGATIVE   Bilirubin Urine NEGATIVE NEGATIVE   Ketones, ur NEGATIVE NEGATIVE   Hgb urine dipstick NEGATIVE NEGATIVE   Protein, ur NEGATIVE NEGATIVE   Nitrite NEGATIVE NEGATIVE   Leukocytes, UA NEGATIVE NEGATIVE  TSH     Status: Abnormal   Collection Time: 03/22/18  1:57 PM  Result Value Ref Range   TSH 0.01 (L) 0.35 - 4.50 uIU/mL  Vitamin D (25 hydroxy)  Status: None   Collection Time: 03/22/18  1:57 PM  Result Value Ref Range   VITD 46.39 30.00 - 100.00 ng/mL  Urine Culture     Status: None   Collection Time: 03/22/18  1:57 PM  Result Value Ref Range   MICRO NUMBER: [id number]    SPECIMEN QUALITY: Adequate    Sample Source URINE     STATUS: FINAL    Result:      Single organism less than 10,000 CFU/mL isolated. These organisms, commonly found on external and internal genitalia, are considered colonizers. No further testing performed.  Gastrointestinal Panel by PCR , Stool     Status: None   Collection Time: 03/28/18  8:30 AM  Result Value Ref Range   Campylobacter species NOT DETECTED NOT DETECTED   Plesimonas shigelloides NOT DETECTED NOT DETECTED   Salmonella species NOT DETECTED NOT DETECTED   Yersinia enterocolitica NOT DETECTED NOT DETECTED   Vibrio species NOT DETECTED NOT DETECTED   Vibrio cholerae NOT DETECTED NOT DETECTED   Enteroaggregative E coli (EAEC) NOT DETECTED NOT DETECTED   Enteropathogenic E coli (EPEC) NOT DETECTED NOT DETECTED   Enterotoxigenic E coli (ETEC) NOT DETECTED NOT DETECTED   Shiga like toxin producing E coli (STEC) NOT DETECTED NOT DETECTED   Shigella/Enteroinvasive E coli (EIEC) NOT DETECTED NOT DETECTED   Cryptosporidium NOT DETECTED NOT DETECTED   Cyclospora cayetanensis NOT DETECTED NOT DETECTED   Entamoeba histolytica NOT DETECTED NOT DETECTED   Giardia lamblia NOT DETECTED NOT DETECTED   Adenovirus F40/41 NOT DETECTED NOT DETECTED   Astrovirus NOT DETECTED NOT DETECTED   Norovirus GI/GII NOT DETECTED NOT DETECTED   Rotavirus A NOT DETECTED NOT DETECTED   Sapovirus (I, II, IV, and V) NOT DETECTED NOT DETECTED    Comment: Performed at [facility], [address] [place], [place] [address]  C difficile quick scan w PCR reflex     Status: None   Collection Time: 03/28/18  8:30 AM  Result Value Ref Range   C Diff antigen NEGATIVE NEGATIVE   C Diff toxin NEGATIVE NEGATIVE   C Diff interpretation No C. difficile detected.     Comment: Performed at [facility], [address] [place],  [address]   Objective  Body mass index is 28.46 kg/m. Wt Readings from Last 3 Encounters:  03/22/18 155 lb 9.6 oz (70.6 kg)  01/29/18 152 lb 12.5 oz (69.3 kg)   01/17/18 150 lb (68 kg)   Temp Readings from Last 3 Encounters:  03/22/18 98.4 F (36.9 C) (Oral)  02/01/18 98.3 F (36.8 C) (Oral)  01/17/18 (!) 97 F (36.1 C) (Tympanic)   BP Readings from Last 3 Encounters:  03/22/18 132/68  02/01/18 (!) 124/52  01/17/18 118/62   Pulse Readings from Last 3 Encounters:  03/22/18 72  02/01/18 61  01/17/18 69    Physical Exam  Constitutional: She is oriented to person, place, and time. Vital signs are normal. She appears well-developed and well-nourished. She is cooperative.  HENT:  Head: Normocephalic and atraumatic.  Mouth/Throat: Oropharynx is clear and moist and mucous membranes are normal.  Eyes: Pupils are equal, round, and reactive to light. Conjunctivae are normal.  Cardiovascular: Normal rate, regular rhythm and normal heart sounds.  Pulmonary/Chest: Effort normal and breath sounds normal.  Neurological: She is alert and oriented to person, place, and time. Gait normal.  Skin: Skin is warm, dry and intact.  Psychiatric: She has a normal mood and affect. Her speech is normal  and behavior is normal. Judgment and thought content normal. Cognition and memory are normal.  Nursing note and vitals reviewed.   Assessment   1. Hypothyroidism with low TSH 2. Chronic nausea and vomiting and diarrhea  And diverticulitis with abscess ? Etiology nausea noted since colon cancer per pt w/in the last year  3. OSA on cpap with hypoxia qhs and noctural O2  4. H/o colon cancer s/p resection 01/2017  5. C/o fatigue and weight loss 30 lbs over 1 year  6. HM Plan   1. On armour thyroid 30 mg and levothyroxine 88 mcg will stop Armour thyroid as TSH is too low  2. Was on zofran 8 mg prn qd will refill as zofran 4 mg prn  Refer to GI to further w/u [facility] Dr. Tiffany Kocher  Check GI pathogen panel and C diff negative  3. Monitor  4. Colonoscopy had 01/2018 due in 2 years and f/u H/o and GI  5. Check TSH today wt loss could be to too much thyroid medication in  addition to dx colon cancer  6.   Flu shot per pt had 02/2018 hospital volunteer though reg vaccine not high dose  Tdap 02/15/14  utd prevnar pna 23 had 12/25/09 consider again Had shingrix vaccine old one she thinks, consider shingrix  -per old notes had 03/2013 zostervax   mammo 01/06/18 rec Dx mammo and [name] of right breast in 6 months  S/p hysterectomy out of age window pap  -pap 10/15/14 negative neg HPV  Colonoscopy 01/17/18 diverticulosis, [facility] Dr. Tiffany Kocher repeat in 2 years h/o colon cancer DEXA 12/21/17 osteopenia   Light smoker age 14 y.o   Former PCP Dr. Clayborn Bigness requested records today including carotid US from 01/2018 in system w/o result   Consider check hep C with labs in future   Dr. Billey Chang psych in [place] Dr. Dorathy Daft H/o  [facility] h/o NMSC right thigh and right upper arm and chest sees yearly Dr. Evorn Gong [place] [facility]  Dentist Dr. Eugenie Birks  GI Dr. Tiffany Kocher  Psych Dr. Milus Banister williams had her on brintellix 20 mg qd. Also Dr. Domingo Cocking at one time was her psych. Also was on effexor 75 mg qd, ambien 10 mg qhs and ativan 1 mg qhs at one time period   Records Dr. Clayborn Bigness reviewed  Lyme IGm elevated slightly 0.82 11/08/13 +western blot  Vit D def 25.0 10/17/14  Anemia panel 10/29/16 iron sat 4% low nl 15-55, iron 17 low nl 27-139, UIBC 457 high nl 369 and TIBC 474 high nl 450   H/o OSA on cpap sleep study (titration) 09/16/12 pressure 7, no sig desat.    Provider: Dr. Olivia Mackie McLean-Scocuzza-[facility] Medicine

## 2018-04-06 DIAGNOSIS — K5732 Diverticulitis of large intestine without perforation or abscess without bleeding: Secondary | ICD-10-CM | POA: Diagnosis not present

## 2018-04-14 ENCOUNTER — Inpatient Hospital Stay: Payer: Medicare Other | Attending: Oncology

## 2018-04-14 ENCOUNTER — Other Ambulatory Visit: Payer: Self-pay

## 2018-04-14 DIAGNOSIS — D509 Iron deficiency anemia, unspecified: Secondary | ICD-10-CM | POA: Diagnosis not present

## 2018-04-14 DIAGNOSIS — C182 Malignant neoplasm of ascending colon: Secondary | ICD-10-CM

## 2018-04-14 LAB — CBC WITH DIFFERENTIAL/PLATELET
Abs Immature Granulocytes: 0.02 10*3/uL (ref 0.00–0.07)
BASOS ABS: 0.1 10*3/uL (ref 0.0–0.1)
Basophils Relative: 1 %
EOS ABS: 0.1 10*3/uL (ref 0.0–0.5)
Eosinophils Relative: 1 %
HCT: 41 % (ref 36.0–46.0)
Hemoglobin: 13.2 g/dL (ref 12.0–15.0)
IMMATURE GRANULOCYTES: 0 %
Lymphocytes Relative: 48 %
Lymphs Abs: 4.1 10*3/uL — ABNORMAL HIGH (ref 0.7–4.0)
MCH: 29.3 pg (ref 26.0–34.0)
MCHC: 32.2 g/dL (ref 30.0–36.0)
MCV: 91.1 fL (ref 80.0–100.0)
Monocytes Absolute: 0.4 10*3/uL (ref 0.1–1.0)
Monocytes Relative: 5 %
Neutro Abs: 3.8 10*3/uL (ref 1.7–7.7)
Neutrophils Relative %: 45 %
Platelets: 230 10*3/uL (ref 150–400)
RBC: 4.5 MIL/uL (ref 3.87–5.11)
RDW: 13 % (ref 11.5–15.5)
WBC: 8.4 10*3/uL (ref 4.0–10.5)
nRBC: 0 % (ref 0.0–0.2)

## 2018-04-14 LAB — COMPREHENSIVE METABOLIC PANEL
ALT: 21 U/L (ref 0–44)
AST: 19 U/L (ref 15–41)
Albumin: 4.1 g/dL (ref 3.5–5.0)
Alkaline Phosphatase: 62 U/L (ref 38–126)
Anion gap: 6 (ref 5–15)
BUN: 11 mg/dL (ref 8–23)
CO2: 28 mmol/L (ref 22–32)
Calcium: 9.3 mg/dL (ref 8.9–10.3)
Chloride: 104 mmol/L (ref 98–111)
Creatinine, Ser: 0.99 mg/dL (ref 0.44–1.00)
GFR calc Af Amer: >60 mL/min (ref >60)
GFR calc non Af Amer: 58 mL/min — ABNORMAL LOW (ref >60)
Glucose, Bld: 97 mg/dL (ref 70–99)
Potassium: 4.3 mmol/L (ref 3.5–5.1)
Sodium: 138 mmol/L (ref 135–145)
Total Bilirubin: 0.6 mg/dL (ref 0.3–1.2)
Total Protein: 6.9 g/dL (ref 6.5–8.1)

## 2018-04-15 ENCOUNTER — Other Ambulatory Visit: Payer: Self-pay

## 2018-04-15 LAB — CEA: CEA1: 1.6 ng/mL (ref 0.0–4.7)

## 2018-04-25 ENCOUNTER — Other Ambulatory Visit: Payer: Self-pay | Admitting: Internal Medicine

## 2018-04-25 DIAGNOSIS — E039 Hypothyroidism, unspecified: Secondary | ICD-10-CM

## 2018-04-25 MED ORDER — LEVOTHYROXINE SODIUM 88 MCG PO TABS: ORAL_TABLET | ORAL | 0 refills | Status: DC

## 2018-04-26 ENCOUNTER — Encounter: Payer: Self-pay | Admitting: Internal Medicine

## 2018-04-26 ENCOUNTER — Telehealth: Payer: Self-pay | Admitting: Internal Medicine

## 2018-04-26 NOTE — Telephone Encounter (Signed)
Call Dr Clayborn Bigness Office there were no results In epic for carotid US 01/07/18 pt looking for results and so are we  Please fax results [phone]   [place]

## 2018-04-28 NOTE — Telephone Encounter (Signed)
Request sent again

## 2018-05-09 ENCOUNTER — Telehealth: Payer: Self-pay | Admitting: Radiology

## 2018-05-09 NOTE — Telephone Encounter (Signed)
Pt coming in for labs tomorrow, please place future orders. Thank you.  

## 2018-05-10 ENCOUNTER — Other Ambulatory Visit: Payer: Self-pay | Admitting: Internal Medicine

## 2018-05-10 ENCOUNTER — Other Ambulatory Visit (INDEPENDENT_AMBULATORY_CARE_PROVIDER_SITE_OTHER): Payer: Medicare Other

## 2018-05-10 ENCOUNTER — Telehealth: Payer: Self-pay | Admitting: Radiology

## 2018-05-10 DIAGNOSIS — L0291 Cutaneous abscess, unspecified: Secondary | ICD-10-CM

## 2018-05-10 DIAGNOSIS — R109 Unspecified abdominal pain: Secondary | ICD-10-CM | POA: Diagnosis not present

## 2018-05-10 DIAGNOSIS — E039 Hypothyroidism, unspecified: Secondary | ICD-10-CM | POA: Diagnosis not present

## 2018-05-10 LAB — CBC WITH DIFFERENTIAL/PLATELET
ABSOLUTE MONOCYTES: 494 {cells}/uL (ref 200–950)
Basophils Absolute: 62 cells/uL (ref 0–200)
Basophils Relative: 0.6 %
Eosinophils Absolute: 52 cells/uL (ref 15–500)
Eosinophils Relative: 0.5 %
HCT: 41 % (ref 35.0–45.0)
Hemoglobin: 13.8 g/dL (ref 11.7–15.5)
Lymphs Abs: 4779 cells/uL — ABNORMAL HIGH (ref 850–3900)
MCH: 30.3 pg (ref 27.0–33.0)
MCHC: 33.7 g/dL (ref 32.0–36.0)
MCV: 89.9 fL (ref 80.0–100.0)
MPV: 9.2 fL (ref 7.5–12.5)
Monocytes Relative: 4.8 %
Neutro Abs: 4913 cells/uL (ref 1500–7800)
Neutrophils Relative %: 47.7 %
Platelets: 309 10*3/uL (ref 140–400)
RBC: 4.56 10*6/uL (ref 3.80–5.10)
RDW: 12.6 % (ref 11.0–15.0)
Total Lymphocyte: 46.4 %
WBC: 10.3 10*3/uL (ref 3.8–10.8)

## 2018-05-10 LAB — TSH: TSH: 1.32 u[IU]/mL (ref 0.35–4.50)

## 2018-05-10 NOTE — Telephone Encounter (Signed)
Pt states she would like her blood count ran. Pt states her abcsess that she has on her colon is acting up and would like it drawn to check her cell count. Advised pt I would have to ask provider to get approval for test to be ran.

## 2018-05-10 NOTE — Addendum Note (Signed)
Addended by: Leeanne Rio on: 05/10/2018 05:10 PM   Modules accepted: Orders

## 2018-05-10 NOTE — Telephone Encounter (Signed)
We just did her blood ct 04/14/18 and it was normal insurance will not cover another for 91 days and she will get a bill does she still want the test run? CBC  Also if she is having colon issues she needs to see surgery or GI does she want referral or we can do a CT scan of her abdomen  -does she want me to order this ?   MTS

## 2018-05-10 NOTE — Telephone Encounter (Signed)
patient is ok with the order for CBC.  She is already7 seeing a Psychologist, sport and exercise and GI. Dr Tiffany Kocher has appointment with her next week for the GI. No CT as of right now.

## 2018-05-11 ENCOUNTER — Ambulatory Visit (INDEPENDENT_AMBULATORY_CARE_PROVIDER_SITE_OTHER): Payer: Medicare Other | Admitting: Psychiatry

## 2018-05-11 ENCOUNTER — Encounter (HOSPITAL_COMMUNITY): Payer: Self-pay | Admitting: Psychiatry

## 2018-05-11 DIAGNOSIS — F900 Attention-deficit hyperactivity disorder, predominantly inattentive type: Secondary | ICD-10-CM

## 2018-05-11 DIAGNOSIS — F331 Major depressive disorder, recurrent, moderate: Secondary | ICD-10-CM | POA: Diagnosis not present

## 2018-05-11 MED ORDER — TRAZODONE HCL 50 MG PO TABS: 50.0000 mg | ORAL_TABLET | Freq: Every day | ORAL | 0 refills | Status: DC

## 2018-05-11 MED ORDER — LAMOTRIGINE 200 MG PO TABS: 200.0000 mg | ORAL_TABLET | ORAL | 0 refills | Status: DC

## 2018-05-11 MED ORDER — TRINTELLIX 20 MG PO TABS: 20.0000 mg | ORAL_TABLET | ORAL | 2 refills | Status: DC

## 2018-05-11 MED ORDER — METHYLPHENIDATE HCL 5 MG PO TABS: 5.0000 mg | ORAL_TABLET | ORAL | 0 refills | Status: DC

## 2018-05-11 NOTE — Progress Notes (Signed)
BH MD/PA/NP OP Progress Note  05/11/2018 1:58 PM [name]  MRN:  [id number]  Chief Complaint: I am not taking second Ritalin.  My focus is good.  I still have fatigue but I had a abdominal abscess.  HPI: [name] came for her follow-up appointment.  On her last visit we increased her Ritalin as she was feeling fatigue, lack of energy however she was found out that she has abdominal abscess that is making her tired and fatigued.  She started seeing a new physician who recommended to see GI and she has appointment on 16th and may need CT scan.  Overall she describes her mood is good.  She had a good Christmas.  She denies any irritability, anger, mania or any psychosis.  She likes to continue Trintellix.  She is sleeping better and cut down her trazodone to 50 mg.  She has no rash, itching, tremors or shakes.  She is taking Lamictal which is helping her mood.  She has no rash, itching, tremors or shakes.  Her appetite is okay.  She lives with her husband.  Her Christmas was good because she was able to see her for grand children.  Patient denies drinking or using any illegal substances.  Visit Diagnosis:    ICD-10-CM   1. Moderate episode of recurrent major depressive disorder (HCC) F33.1 TRINTELLIX 20 MG TABS tablet    lamoTRIgine (LAMICTAL) 200 MG tablet    traZODone (DESYREL) 50 MG tablet  2. Attention deficit hyperactivity disorder (ADHD), predominantly inattentive type F90.0 methylphenidate (RITALIN) 5 MG tablet    Past Psychiatric History: Reviewed. History of mania, irritability, impulsive behavior and overdose at age 56.  Admitted twice in the past.  Diagnosed with ADD and had a psychological testing.  Took Concerta, (Adderall, Vyvanse make her anxious) Wellbutrin, Prozac, lithium, Effexor, Lexapro, Abilify and Cymbalta.  She recall best treatment when she was given a female hormone to help endometriosis.  Had a history of ECT which failed.  Had a Scribner which helped her.  No history  of psychosis or any hallucination.  Past Medical History:  Past Medical History:  Diagnosis Date  . Allergy   . Anemia   . Carcinoma ([place])   . Colon cancer ([place]) 01/2017  . Colon polyps   . Diverticulitis    01/2018 abscess hosp. 01/2018  . Fibromyalgia   . Gastric polyps   . GERD (gastroesophageal reflux disease)   . Hemorrhoids   . Hypothyroidism   . Iron deficiency anemia 03/08/2017  . Major depressive disorder, recurrent episode, mild ([place])   . Miscarriage    x2  . PONV (postoperative nausea and vomiting)   . Skin cancer    arm, thigh and chest Dr. Evorn Gong   . Sleep apnea    USES CPAP  . UTI (urinary tract infection)     Past Surgical History:  Procedure Laterality Date  . ABDOMINAL HYSTERECTOMY  1990  . BREAST BIOPSY Left 2004   benign  . CATARACT EXTRACTION, BILATERAL Bilateral 05/2012  . CHOLECYSTECTOMY  2001  . COLONOSCOPY WITH PROPOFOL N/A 12/28/2016   Procedure: COLONOSCOPY WITH PROPOFOL;  Surgeon: Manya Silvas, MD;  Location: [facility] ENDOSCOPY;  Service: Endoscopy;  Laterality: N/A;  . COLONOSCOPY WITH PROPOFOL N/A 01/17/2018   Procedure: COLONOSCOPY WITH PROPOFOL;  Surgeon: Manya Silvas, MD;  Location: [facility] ENDOSCOPY;  Service: Endoscopy;  Laterality: N/A;  . ESOPHAGOGASTRODUODENOSCOPY (EGD) WITH PROPOFOL N/A 12/28/2016   Procedure: ESOPHAGOGASTRODUODENOSCOPY (EGD) WITH PROPOFOL;  Surgeon: Gaylyn Cheers  T, MD;  Location: [facility] ENDOSCOPY;  Service: Endoscopy;  Laterality: N/A;  . EYE SURGERY     lens left eye, cataract right eye   . LAPAROSCOPIC RIGHT COLECTOMY Right 01/21/2017   Procedure: LAPAROSCOPIC ASSISTED RIGHT COLECTOMY;  Surgeon: Robert Bellow, MD;  Location: [facility] ORS;  Service: General;  Laterality: Right;  . TONSILLECTOMY      Family Psychiatric History: Reviewed.  Family History:  Family History  Problem Relation Age of Onset  . Depression Brother   . Alcohol abuse Brother   . Suicidality Brother   . Stroke Mother   . Hyperlipidemia  Mother   . Hypertension Mother   . Clotting disorder Brother   . Cancer Brother        MDS  . Colon polyps Father   . Melanoma Sister   . Cancer Sister        skin MM  . Stroke Maternal Grandfather   . Bipolar disorder Neg Hx     Social History:  Social History   Socioeconomic History  . Marital status: Married    Spouse name: Not on file  . Number of children: Not on file  . Years of education: Not on file  . Highest education level: Not on file  Occupational History  . Not on file  Social Needs  . Financial resource strain: Not on file  . Food insecurity:    Worry: Not on file    Inability: Not on file  . Transportation needs:    Medical: Not on file    Non-medical: Not on file  Tobacco Use  . Smoking status: Never Smoker  . Smokeless tobacco: Never Used  Substance and Sexual Activity  . Alcohol use: Not Currently    Alcohol/week: 0.0 standard drinks    Comment:    . Drug use: No  . Sexual activity: Yes    Partners: Male    Birth control/protection: None  Lifestyle  . Physical activity:    Days per week: Not on file    Minutes per session: Not on file  . Stress: Not on file  Relationships  . Social connections:    Talks on phone: Not on file    Gets together: Not on file    Attends religious service: Not on file    Active member of club or organization: Not on file    Attends meetings of clubs or organizations: Not on file    Relationship status: Not on file  Other Topics Concern  . 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 Other     BAND-AIDS IF LEFT ON FOR EXTENDED PERIODS-REDNESS  . Codeine     Nausea    Metabolic Disorder Labs: No results found for: HGBA1C, MPG No results found for: PROLACTIN Lab Results  Component Value Date   CHOL 172 11/25/2017   TRIG  129 11/25/2017   HDL 52 11/25/2017   LDLCALC 94 11/25/2017   Lab Results  Component Value Date   TSH 1.32 05/10/2018   TSH 0.01 (L) 03/22/2018    Therapeutic Level Labs: No results found for: LITHIUM No results found for: VALPROATE No components found for:  CBMZ  Current Medications: Current Outpatient Medications  Medication Sig Dispense  Refill  . amoxicillin-clavulanate (AUGMENTIN) 875-125 MG tablet Take 1 tablet by mouth every 12 (twelve) hours. 18 tablet 0  . calcium-vitamin D (OSCAL WITH D) 500-200 MG-UNIT tablet Take 2 tablets by mouth 2 (two) times daily.    . cetirizine (ZYRTEC) 10 MG tablet Take 10 mg by mouth daily as needed for allergies.     Marland Kitchen estradiol (ESTRACE) 0.5 MG tablet Take 1 tablet (0.5 mg total) by mouth daily. 90 tablet 3  . lamoTRIgine (LAMICTAL) 200 MG tablet Take 1 tablet (200 mg total) by mouth every morning. 90 tablet 0  . levothyroxine (SYNTHROID, LEVOTHROID) 88 MCG tablet TAKE ONE TABLET BY MOUTH EVERY MORNING ON AN EMPTY STOMACH 90 tablet 0  . methylphenidate (RITALIN) 5 MG tablet Take one in am and 2nd at noon 60 tablet 0  . montelukast (SINGULAIR) 10 MG tablet Take 1 tablet (10 mg total) by mouth at bedtime. 30 tablet 3  . Multiple Vitamins-Minerals (MULTIVITAMIN WITH MINERALS) tablet Take 1 tablet by mouth daily.    Marland Kitchen omeprazole (PRILOSEC) 20 MG capsule Take 1 capsule (20 mg total) by mouth daily before breakfast. 90 capsule 3  . ondansetron (ZOFRAN-ODT) 4 MG disintegrating tablet Take 1 tablet (4 mg total) by mouth 2 (two) times daily as needed for nausea or vomiting. AND PRN 90 tablet 0  . traZODone (DESYREL) 50 MG tablet Take 1 tablet (50 mg total) by mouth at bedtime.    . TRINTELLIX 20 MG TABS tablet Take 1 tablet (20 mg total) by mouth every morning. 30 tablet 2   No current facility-administered medications for this visit.      Musculoskeletal: Strength & Muscle Tone: within normal limits Gait & Station: normal Patient leans:  N/A  Psychiatric Specialty Exam: ROS  Blood pressure 121/82, pulse 64, height 5\' 2"  (1.575 m), weight 152 lb (68.9 kg), SpO2 100 %.There is no height or weight on file to calculate BMI.  General Appearance: Casual  Eye Contact:  Good  Speech:  Clear and Coherent  Volume:  Normal  Mood:  Euthymic  Affect:  Congruent  Thought Process:  Goal Directed  Orientation:  Full (Time, Place, and Person)  Thought Content: Logical   Suicidal Thoughts:  No  Homicidal Thoughts:  No  Memory:  Immediate;   Good Recent;   Good Remote;   Good  Judgement:  Good  Insight:  Good  Psychomotor Activity:  Normal  Concentration:  Concentration: Good and Attention Span: Good  Recall:  Good  Fund of Knowledge: Good  Language: Good  Akathisia:  No  Handed:  Right  AIMS (if indicated): not done  Assets:  Communication Skills Desire for Improvement Housing Resilience Social Support  ADL's:  Intact  Cognition: WNL  Sleep:  Fair   Screenings: AUDIT     Admission (Discharged) from OP Visit from 07/15/2012 in [facility]  Alcohol Use Disorder Identification Test Final Score (AUDIT)  1    Mini-Mental     Office Visit from 11/24/2017 in [facility], [facility]  Total Score (max 30 points )  27    PHQ2-9     Office Visit from 03/22/2018 in [facility] Office Visit from 11/24/2017 in [facility], [facility] Office Visit from 11/11/2017 in [facility], [facility] Office Visit from 06/14/2017 in [facility], [facility]  PHQ-2 Total Score  0  0  0  0       Assessment and Plan: Major depressive disorder,  recurrent.  Attention deficit disorder, inattentive type.  Patient recently find out about abdominal abscess and she is doing work-up and is scheduled to see GI in 1 week.  She had cut down her Ritalin to take only in the morning.  She also cut down her trazodone to 50 mg as she is sleeping better.  I will continue Trintellix 20 mg  daily, Ritalin 5 mg in the morning, Lamictal 200 mg daily.  She has leftover trazodone however she needed a new prescription of 50 mg in case she needed.  Discussed medication side effects and benefits.  She has no rash, itching, tremors or shakes.  I reviewed recent blood work results.  She has lymphocytosis.  She is in a process of GI work-up.  I will see her again in 3 months.  Commended to call us back if she has any question or any concern.   Kathlee Nations, MD 05/11/2018, 1:58 PM

## 2018-05-19 ENCOUNTER — Other Ambulatory Visit (HOSPITAL_COMMUNITY): Payer: Self-pay | Admitting: Student

## 2018-05-19 ENCOUNTER — Other Ambulatory Visit: Payer: Self-pay | Admitting: Student

## 2018-05-19 DIAGNOSIS — R1032 Left lower quadrant pain: Secondary | ICD-10-CM

## 2018-05-19 DIAGNOSIS — R350 Frequency of micturition: Secondary | ICD-10-CM | POA: Diagnosis not present

## 2018-05-19 DIAGNOSIS — Z01812 Encounter for preprocedural laboratory examination: Secondary | ICD-10-CM | POA: Diagnosis not present

## 2018-05-19 DIAGNOSIS — R1013 Epigastric pain: Secondary | ICD-10-CM | POA: Diagnosis not present

## 2018-05-19 DIAGNOSIS — K5720 Diverticulitis of large intestine with perforation and abscess without bleeding: Secondary | ICD-10-CM | POA: Diagnosis not present

## 2018-05-19 DIAGNOSIS — Z8719 Personal history of other diseases of the digestive system: Secondary | ICD-10-CM

## 2018-05-19 DIAGNOSIS — R110 Nausea: Secondary | ICD-10-CM | POA: Diagnosis not present

## 2018-05-20 ENCOUNTER — Ambulatory Visit
Admission: RE | Admit: 2018-05-20 | Discharge: 2018-05-20 | Disposition: A | Discharge status: Home / Self Care | Payer: Medicare Other | Source: Ambulatory Visit | Attending: Student | Admitting: Student

## 2018-05-20 ENCOUNTER — Telehealth: Payer: Self-pay

## 2018-05-20 DIAGNOSIS — K5792 Diverticulitis of intestine, part unspecified, without perforation or abscess without bleeding: Secondary | ICD-10-CM | POA: Diagnosis not present

## 2018-05-20 DIAGNOSIS — R110 Nausea: Secondary | ICD-10-CM | POA: Diagnosis not present

## 2018-05-20 DIAGNOSIS — Z8719 Personal history of other diseases of the digestive system: Secondary | ICD-10-CM | POA: Insufficient documentation

## 2018-05-20 DIAGNOSIS — R1032 Left lower quadrant pain: Secondary | ICD-10-CM | POA: Diagnosis not present

## 2018-05-20 MED ORDER — IOPAMIDOL (ISOVUE-370) INJECTION 76%
100.0000 mL | Freq: Once | INTRAVENOUS | Status: AC | PRN
  Administered 2018-05-20: 80 mL via INTRAVENOUS

## 2018-05-20 NOTE — Telephone Encounter (Signed)
PT CAROTID US RESULTS WERE FAXED TO [facility] ON 05/05/2018.

## 2018-05-26 ENCOUNTER — Ambulatory Visit: Payer: Self-pay | Admitting: Nurse Practitioner

## 2018-05-30 DIAGNOSIS — D2271 Melanocytic nevi of right lower limb, including hip: Secondary | ICD-10-CM | POA: Diagnosis not present

## 2018-05-30 DIAGNOSIS — D2262 Melanocytic nevi of left upper limb, including shoulder: Secondary | ICD-10-CM | POA: Diagnosis not present

## 2018-05-30 DIAGNOSIS — D225 Melanocytic nevi of trunk: Secondary | ICD-10-CM | POA: Diagnosis not present

## 2018-05-30 DIAGNOSIS — D485 Neoplasm of uncertain behavior of skin: Secondary | ICD-10-CM | POA: Diagnosis not present

## 2018-05-30 DIAGNOSIS — D2272 Melanocytic nevi of left lower limb, including hip: Secondary | ICD-10-CM | POA: Diagnosis not present

## 2018-05-30 DIAGNOSIS — Z85828 Personal history of other malignant neoplasm of skin: Secondary | ICD-10-CM | POA: Diagnosis not present

## 2018-05-30 DIAGNOSIS — Z08 Encounter for follow-up examination after completed treatment for malignant neoplasm: Secondary | ICD-10-CM | POA: Diagnosis not present

## 2018-05-30 DIAGNOSIS — D2261 Melanocytic nevi of right upper limb, including shoulder: Secondary | ICD-10-CM | POA: Diagnosis not present

## 2018-05-31 ENCOUNTER — Encounter: Payer: Self-pay | Admitting: Internal Medicine

## 2018-05-31 ENCOUNTER — Ambulatory Visit (INDEPENDENT_AMBULATORY_CARE_PROVIDER_SITE_OTHER): Payer: Medicare Other | Admitting: Internal Medicine

## 2018-05-31 VITALS — BP 122/74 | HR 78 | Temp 98.6°F | Ht 62.0 in | Wt 153.0 lb

## 2018-05-31 DIAGNOSIS — E039 Hypothyroidism, unspecified: Secondary | ICD-10-CM | POA: Diagnosis not present

## 2018-05-31 DIAGNOSIS — F339 Major depressive disorder, recurrent, unspecified: Secondary | ICD-10-CM

## 2018-05-31 DIAGNOSIS — Z23 Encounter for immunization: Secondary | ICD-10-CM | POA: Diagnosis not present

## 2018-05-31 DIAGNOSIS — Z13818 Encounter for screening for other digestive system disorders: Secondary | ICD-10-CM

## 2018-05-31 DIAGNOSIS — N6310 Unspecified lump in the right breast, unspecified quadrant: Secondary | ICD-10-CM

## 2018-05-31 DIAGNOSIS — R102 Pelvic and perineal pain: Secondary | ICD-10-CM | POA: Diagnosis not present

## 2018-05-31 MED ORDER — LEVOTHYROXINE SODIUM 100 MCG PO TABS: ORAL_TABLET | ORAL | 3 refills | Status: DC

## 2018-05-31 NOTE — Progress Notes (Signed)
Dr. Adele Schilder phone # [phone] call for fax  This is w/in [facility]   [place]

## 2018-05-31 NOTE — Progress Notes (Signed)
Chief Complaint  Patient presents with  . Follow-up   F/u  1. Hypothyroidism TSH normal now since off [place] thyroid pt reports her depression is back since her TSH is not low 2. H/o depression uncontrolled, concentration off and went the wrong way to come here today, lack of interested/motivation and fatigue, more emotional psych Dr. Marchia Bond in [place] rec she call to f/u h/o hosp. X 2 , ECT x 6 and transcranial magnetic tx 5 years ago.   3. C/o lower pelvic pain CT 05/20/2018 s/p right colectomy normal no fluid collection and no etiology for lower pelvic pain. She does report h/o endometriosis s/p hysterectomy years ago and ws on oral medications. She has chronic diarrhea but denies constipation. Discomfort comes and goes. 05/19/2018 UA normal with GI    Review of Systems  Constitutional: Negative for weight loss.  HENT: Negative for hearing loss.   Eyes: Negative for blurred vision.  Respiratory: Negative for shortness of breath.   Cardiovascular: Negative for chest pain.  Gastrointestinal: Positive for abdominal pain.  Genitourinary:       Lower pelvic pain    Skin: Negative for rash.  Neurological: Negative for headaches.  Psychiatric/Behavioral: Positive for depression.   Past Medical History:  Diagnosis Date  . Allergy   . Anemia   . Carcinoma ([place])   . Colon cancer ([place]) 01/2017  . Colon polyps   . Diverticulitis    01/2018 abscess hosp. 01/2018  . Fibromyalgia   . Gastric polyps   . GERD (gastroesophageal reflux disease)   . Hemorrhoids   . Hypothyroidism   . Iron deficiency anemia 03/08/2017  . Major depressive disorder, recurrent episode, mild (HCC)    Dr. Adele Schilder in [place] h/o ECT x 6 x and hosp x 2 and transcranial magnetic treatment   . Miscarriage    x2  . PONV (postoperative nausea and vomiting)   . Skin cancer    arm, thigh and chest Dr. Evorn Gong   . Sleep apnea    USES CPAP  . UTI (urinary tract infection)    Past Surgical History:  Procedure Laterality Date  .  ABDOMINAL HYSTERECTOMY  1990  . BREAST BIOPSY Left 2004   benign  . CATARACT EXTRACTION, BILATERAL Bilateral 05/2012  . CHOLECYSTECTOMY  2001  . COLONOSCOPY WITH PROPOFOL N/A 12/28/2016   Procedure: COLONOSCOPY WITH PROPOFOL;  Surgeon: Manya Silvas, MD;  Location: [facility] ENDOSCOPY;  Service: Endoscopy;  Laterality: N/A;  . COLONOSCOPY WITH PROPOFOL N/A 01/17/2018   Procedure: COLONOSCOPY WITH PROPOFOL;  Surgeon: Manya Silvas, MD;  Location: [facility] ENDOSCOPY;  Service: Endoscopy;  Laterality: N/A;  . ESOPHAGOGASTRODUODENOSCOPY (EGD) WITH PROPOFOL N/A 12/28/2016   Procedure: ESOPHAGOGASTRODUODENOSCOPY (EGD) WITH PROPOFOL;  Surgeon: Manya Silvas, MD;  Location: [facility] ENDOSCOPY;  Service: Endoscopy;  Laterality: N/A;  . EYE SURGERY     lens left eye, cataract right eye   . LAPAROSCOPIC RIGHT COLECTOMY Right 01/21/2017   Procedure: LAPAROSCOPIC ASSISTED RIGHT COLECTOMY;  Surgeon: Robert Bellow, MD;  Location: [facility] ORS;  Service: General;  Laterality: Right;  . TONSILLECTOMY     Family History  Problem Relation Age of Onset  . Depression Brother   . Alcohol abuse Brother   . Suicidality Brother   . Stroke Mother   . Hyperlipidemia Mother   . Hypertension Mother   . Clotting disorder Brother   . Cancer Brother        MDS  . Colon polyps Father   . Melanoma Sister   .  Cancer Sister        skin MM  . Stroke Maternal Grandfather   . Bipolar disorder Neg Hx    Social History   Socioeconomic History  . Marital status: Married    Spouse name: Not on file  . Number of children: Not on file  . Years of education: Not on file  . Highest education level: Not on file  Occupational History  . Not on file  Social Needs  . Financial resource strain: Not on file  . Food insecurity:    Worry: Not on file    Inability: Not on file  . Transportation needs:    Medical: Not on file    Non-medical: Not on file  Tobacco Use  . Smoking status: Never Smoker  . Smokeless tobacco:  Never Used  Substance and Sexual Activity  . Alcohol use: Not Currently    Alcohol/week: 0.0 standard drinks    Comment:    . Drug use: No  . Sexual activity: Yes    Partners: Male    Birth control/protection: None  Lifestyle  . Physical activity:    Days per week: Not on file    Minutes per session: Not on file  . Stress: Not on file  Relationships  . Social connections:    Talks on phone: Not on file    Gets together: Not on file    Attends religious service: Not on file    Active member of club or organization: Not on file    Attends meetings of clubs or organizations: Not on file    Relationship status: Not on file  . Intimate partner violence:    Fear of current or ex partner: Not on file    Emotionally abused: Not on file    Physically abused: Not on file    Forced sexual activity: Not on file  Other Topics Concern  . 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 calcium-vitamin D (OSCAL WITH D) 500-200 MG-UNIT tablet Take 2 tablets by mouth 2 (two) times daily.  . cetirizine (ZYRTEC) 10 MG tablet Take 10 mg by mouth daily as needed for allergies.   [place] Kitchen estradiol (ESTRACE) 0.5 MG tablet Take 1 tablet (0.5 mg total) by mouth daily.  Marland Kitchen lamoTRIgine (LAMICTAL) 200 MG tablet Take 1 tablet (200 mg total) by mouth every morning.  Marland Kitchen levothyroxine (SYNTHROID, LEVOTHROID) 100 MCG tablet TAKE ONE TABLET BY MOUTH EVERY MORNING ON AN EMPTY STOMACH  . methylphenidate (RITALIN) 5 MG tablet Take 1 tablet (5 mg total) by mouth every morning.  . montelukast (SINGULAIR) 10 MG tablet Take 1 tablet (10 mg total) by mouth at bedtime.  . Multiple Vitamins-Minerals (MULTIVITAMIN WITH MINERALS) tablet Take 1 tablet by mouth daily.  Marland Kitchen omeprazole (PRILOSEC) 20 MG capsule Take 1 capsule (20 mg total) by mouth daily before breakfast.  .  ondansetron (ZOFRAN-ODT) 4 MG disintegrating tablet Take 1 tablet (4 mg total) by mouth 2 (two) times daily as needed for nausea or vomiting. AND PRN  . traZODone (DESYREL) 50 MG tablet Take 1 tablet (50 mg total) by mouth at bedtime.  . TRINTELLIX 20 MG TABS tablet Take 1 tablet (20 mg total) by mouth every morning.  . [DISCONTINUED] levothyroxine (SYNTHROID, LEVOTHROID)  88 MCG tablet TAKE ONE TABLET BY MOUTH EVERY MORNING ON AN EMPTY STOMACH   Allergies  Allergen Reactions  . Other     BAND-AIDS IF LEFT ON FOR EXTENDED PERIODS-REDNESS  . Codeine     Nausea   Recent Results (from the past 2160 hour(s))  Urinalysis, Routine w reflex microscopic     Status: None   Collection Time: 03/22/18  1:57 PM  Result Value Ref Range   Color, Urine YELLOW YELLOW   APPearance CLEAR CLEAR   Specific Gravity, Urine 1.012 1.001 - 1.03   pH 7.0 5.0 - 8.0   Glucose, UA NEGATIVE NEGATIVE   Bilirubin Urine NEGATIVE NEGATIVE   Ketones, ur NEGATIVE NEGATIVE   Hgb urine dipstick NEGATIVE NEGATIVE   Protein, ur NEGATIVE NEGATIVE   Nitrite NEGATIVE NEGATIVE   Leukocytes, UA NEGATIVE NEGATIVE  TSH     Status: Abnormal   Collection Time: 03/22/18  1:57 PM  Result Value Ref Range   TSH 0.01 (L) 0.35 - 4.50 uIU/mL  Vitamin D (25 hydroxy)     Status: None   Collection Time: 03/22/18  1:57 PM  Result Value Ref Range   VITD 46.39 30.00 - 100.00 ng/mL  Urine Culture     Status: None   Collection Time: 03/22/18  1:57 PM  Result Value Ref Range   MICRO NUMBER: [id number]    SPECIMEN QUALITY: Adequate    Sample Source URINE    STATUS: FINAL    Result:      Single organism less than 10,000 CFU/mL isolated. These organisms, commonly found on external and internal genitalia, are considered colonizers. No further testing performed.  Gastrointestinal Panel by PCR , Stool     Status: None   Collection Time: 03/28/18  8:30 AM  Result Value Ref Range   Campylobacter species NOT DETECTED NOT DETECTED   Plesimonas  shigelloides NOT DETECTED NOT DETECTED   Salmonella species NOT DETECTED NOT DETECTED   Yersinia enterocolitica NOT DETECTED NOT DETECTED   Vibrio species NOT DETECTED NOT DETECTED   Vibrio cholerae NOT DETECTED NOT DETECTED   Enteroaggregative E coli (EAEC) NOT DETECTED NOT DETECTED   Enteropathogenic E coli (EPEC) NOT DETECTED NOT DETECTED   Enterotoxigenic E coli (ETEC) NOT DETECTED NOT DETECTED   Shiga like toxin producing E coli (STEC) NOT DETECTED NOT DETECTED   Shigella/Enteroinvasive E coli (EIEC) NOT DETECTED NOT DETECTED   Cryptosporidium NOT DETECTED NOT DETECTED   Cyclospora cayetanensis NOT DETECTED NOT DETECTED   Entamoeba histolytica NOT DETECTED NOT DETECTED   Giardia lamblia NOT DETECTED NOT DETECTED   Adenovirus F40/41 NOT DETECTED NOT DETECTED   Astrovirus NOT DETECTED NOT DETECTED   Norovirus GI/GII NOT DETECTED NOT DETECTED   Rotavirus A NOT DETECTED NOT DETECTED   Sapovirus (I, II, IV, and V) NOT DETECTED NOT DETECTED    Comment: Performed at [facility], [address] [place], [place] [address]  C difficile quick scan w PCR reflex     Status: None   Collection Time: 03/28/18  8:30 AM  Result Value Ref Range   C Diff antigen NEGATIVE NEGATIVE   C Diff toxin NEGATIVE NEGATIVE   C Diff interpretation No C. difficile detected.     Comment: Performed at [facility], [address] [place], [place] [address]  CEA     Status: None   Collection Time: 04/14/18 10:23 AM  Result Value Ref Range   CEA 1.6 0.0 - 4.7 ng/mL    Comment: (  NOTE)                             Nonsmokers          <3.9                             Smokers             <5.6 [employer] Electrochemiluminescence Immunoassay (ECLIA) Values obtained with different assay methods or kits cannot be used interchangeably.  Results cannot be interpreted as absolute evidence of the presence or absence of malignant disease. Performed At: [facility] [place], [place] [id number] Rush Farmer MD [id number]   Comprehensive metabolic panel     Status: Abnormal   Collection Time: 04/14/18 10:23 AM  Result Value Ref Range   Sodium 138 135 - 145 mmol/L   Potassium 4.3 3.5 - 5.1 mmol/L   Chloride 104 98 - 111 mmol/L   CO2 28 22 - 32 mmol/L   Glucose, Bld 97 70 - 99 mg/dL   BUN 11 8 - 23 mg/dL   Creatinine, Ser 0.99 0.44 - 1.00 mg/dL   Calcium 9.3 8.9 - 10.3 mg/dL   Total Protein 6.9 6.5 - 8.1 g/dL   Albumin 4.1 3.5 - 5.0 g/dL   AST 19 15 - 41 U/L   ALT 21 0 - 44 U/L   Alkaline Phosphatase 62 38 - 126 U/L   Total Bilirubin 0.6 0.3 - 1.2 mg/dL   GFR calc non Af Amer 58 (L) >60 mL/min   GFR calc Af Amer >60 >60 mL/min   Anion gap 6 5 - 15    Comment: Performed at [facility], [address] [place], [place] [address]  CBC with Differential/Platelet     Status: Abnormal   Collection Time: 04/14/18 10:23 AM  Result Value Ref Range   WBC 8.4 4.0 - 10.5 K/uL   RBC 4.50 3.87 - 5.11 MIL/uL   Hemoglobin 13.2 12.0 - 15.0 g/dL   HCT 41.0 36.0 - 46.0 %   MCV 91.1 80.0 - 100.0 fL   MCH 29.3 26.0 - 34.0 pg   MCHC 32.2 30.0 - 36.0 g/dL   RDW 13.0 11.5 - 15.5 %   Platelets 230 150 - 400 K/uL   nRBC 0.0 0.0 - 0.2 %   Neutrophils Relative % 45 %   Neutro Abs 3.8 1.7 - 7.7 K/uL   Lymphocytes Relative 48 %   Lymphs Abs 4.1 (H) 0.7 - 4.0 K/uL   Monocytes Relative 5 %   Monocytes Absolute 0.4 0.1 - 1.0 K/uL   Eosinophils Relative 1 %   Eosinophils Absolute 0.1 0.0 - 0.5 K/uL   Basophils Relative 1 %   Basophils Absolute 0.1 0.0 - 0.1 K/uL   Immature Granulocytes 0 %   Abs Immature Granulocytes 0.02 0.00 - 0.07 K/uL    Comment: Performed at [facility], [address] [place], [place] [address]  TSH     Status: None   Collection Time: 05/10/18 10:20 AM  Result Value Ref Range   TSH 1.32 0.35 - 4.50 uIU/mL  CBC with Differential/Platelet     Status: Abnormal   Collection Time: 05/10/18  5:10 PM  Result Value Ref Range    WBC 10.3 3.8 - 10.8 Thousand/uL   RBC 4.56 3.80 - 5.10 Million/uL   Hemoglobin 13.8 11.7 -  15.5 g/dL   HCT 41.0 35.0 - 45.0 %   MCV 89.9 80.0 - 100.0 fL   MCH 30.3 27.0 - 33.0 pg   MCHC 33.7 32.0 - 36.0 g/dL   RDW 12.6 11.0 - 15.0 %   Platelets 309 140 - 400 Thousand/uL   MPV 9.2 7.5 - 12.5 fL   Neutro Abs 4,913 1,500 - 7,800 cells/uL   Lymphs Abs 4,779 (H) 850 - 3,900 cells/uL   Absolute Monocytes 494 200 - 950 cells/uL   Eosinophils Absolute 52 15 - 500 cells/uL   Basophils Absolute 62 0 - 200 cells/uL   Neutrophils Relative % 47.7 %   Total Lymphocyte 46.4 %   Monocytes Relative 4.8 %   Eosinophils Relative 0.5 %   Basophils Relative 0.6 %   Objective  Body mass index is 27.98 kg/m. Wt Readings from Last 3 Encounters:  05/31/18 153 lb (69.4 kg)  03/22/18 155 lb 9.6 oz (70.6 kg)  01/29/18 152 lb 12.5 oz (69.3 kg)   Temp Readings from Last 3 Encounters:  05/31/18 98.6 F (37 C) (Oral)  03/22/18 98.4 F (36.9 C) (Oral)  02/01/18 98.3 F (36.8 C) (Oral)   BP Readings from Last 3 Encounters:  05/31/18 122/74  03/22/18 132/68  02/01/18 (!) 124/52   Pulse Readings from Last 3 Encounters:  05/31/18 78  03/22/18 72  02/01/18 61    Physical Exam Vitals signs and nursing note reviewed.  Constitutional:      Appearance: Normal appearance. She is well-developed and well-groomed.  HENT:     Head: Normocephalic and atraumatic.     Nose: Nose normal.     Mouth/Throat:     Mouth: Mucous membranes are moist.     Pharynx: Oropharynx is clear.  Eyes:     Conjunctiva/sclera: Conjunctivae normal.     Pupils: Pupils are equal, round, and reactive to light.  Cardiovascular:     Rate and Rhythm: Normal rate and regular rhythm.     Heart sounds: Normal heart sounds. No murmur.  Pulmonary:     Effort: Pulmonary effort is normal.     Breath sounds: Normal breath sounds.  Abdominal:     General: Abdomen is flat. Bowel sounds are normal. There is no distension.      Tenderness: There is abdominal tenderness in the right lower quadrant and suprapubic area.  Skin:    General: Skin is warm and dry.  Neurological:     General: No focal deficit present.     Mental Status: She is alert and oriented to person, place, and time. Mental status is at baseline.     Gait: Gait normal.  Psychiatric:        Attention and Perception: Attention and perception normal.        Mood and Affect: Mood and affect normal.        Speech: Speech normal.        Behavior: Behavior normal. Behavior is cooperative.        Thought Content: Thought content normal.        Cognition and Memory: Cognition and memory normal.        Judgment: Judgment normal.     Assessment   1. Hypothyroidism with normal TSH now though pt requests to keep TSH very low which I disagree with and discussed with her initial visit and today. She thinks when TSH is in normal range her depression is triggered  2. Depression uncontrolled  3. Lower pelvic pain h/o endometriosis  s/p hystrectomy, also in Ddx IBS-D, she also has h/o right colon cancer s/p colectomy so adhesions is in DDx  4. HM Plan   1. Pt would like to increase TSH to 100 from 88  Will repeat tsh in 6-8 weeks I informed pt I am not agreeable to keep her TSH below reference range and if TSH goes low again will need to reduce thyroid medication  2. Advised she contact Dr. Marchia Bond psych she may need transcranial magnetic tx again  3. Consider gyn she wants in [place] if needed will call back with name  Disc antispasmodics today declined for now  UA normal 05/09/2018 less likely UTI  4.  Flu shot per pt had 02/2018 hospital volunteer though reg vaccine not high dose  Tdap 02/15/14  utd prevnar pna 23 updated today  Had shingrix vaccine old one she thinks, consider shingrix given Rx today  -per old notes had 03/2013 zostervax   mammo 01/06/18 rec Dx mammo and [name] of right breast in 6 months needs to call to sch S/p hysterectomy out of age window pap   -pap 10/15/14 negative neg HPV  Colonoscopy 01/17/18 diverticulosis, [facility] Dr. Tiffany Kocher repeat in 2 years h/o colon cancer DEXA 12/21/17 osteopenia   Light smoker age 89 y.o   Former PCP Dr. Clayborn Bigness requested records today including carotid US from 01/2018 in system w/o result. Pt to call/send letter to get results faxed to [name]  Consider check hep C with labs in future  Provider: Dr. Olivia Mackie McLean-Scocuzza-[facility]

## 2018-05-31 NOTE — Patient Instructions (Addendum)
Call psychiatry about mood changing as well Dr. Adele Schilder  Call me back if lower pelvic pain continues will refer to [place] please find out the name    Pelvic Pain, Female Pelvic pain is pain in your lower abdomen, below your belly button and between your hips. The pain may start suddenly (be acute), keep coming back (be recurring), or last a long time (become chronic). Pelvic pain that lasts longer than 6 months is considered chronic. Pelvic pain may affect your:  Reproductive organs.  Urinary system.  Digestive tract.  Musculoskeletal system. There are many potential causes of pelvic pain. Sometimes, the pain can be a result of digestive or urinary conditions, strained muscles or ligaments, or reproductive conditions. Sometimes the cause of pelvic pain is not known. Follow these instructions at home:   Take over-the-counter and prescription medicines only as told by your health care provider.  Rest as told by your health care provider.  Do not have sex if it hurts.  Keep a journal of your pelvic pain. Write down: ? When the pain started. ? Where the pain is located. ? What seems to make the pain better or worse, such as food or your period (menstrual cycle). ? Any symptoms you have along with the pain.  Keep all follow-up visits as told by your health care provider. This is important. Contact a health care provider if:  Medicine does not help your pain.  Your pain comes back.  You have new symptoms.  You have abnormal vaginal discharge or bleeding, including bleeding after menopause.  You have a fever or chills.  You are constipated.  You have blood in your urine or stool.  You have foul-smelling urine.  You feel weak or light-headed. Get help right away if:  You have sudden severe pain.  Your pain gets steadily worse.  You have severe pain along with fever, nausea, vomiting, or excessive sweating.  You lose consciousness. Summary  Pelvic pain is pain in your  lower abdomen, below your belly button and between your hips.  There are many potential causes of pelvic pain.  Keep a journal of your pelvic pain. This information is not intended to replace advice given to you by your health care provider. Make sure you discuss any questions you have with your health care provider. Document Released: 03/17/2004 Document Revised: 10/06/2017 Document Reviewed: 10/06/2017 Elsevier Interactive Patient Education  2019 [employer].

## 2018-05-31 NOTE — Progress Notes (Signed)
Pre visit review using our clinic review tool, if applicable. No additional management support is needed unless otherwise documented below in the visit note. 

## 2018-06-06 DIAGNOSIS — H2513 Age-related nuclear cataract, bilateral: Secondary | ICD-10-CM | POA: Diagnosis not present

## 2018-06-17 ENCOUNTER — Other Ambulatory Visit (HOSPITAL_COMMUNITY): Payer: Self-pay

## 2018-06-17 DIAGNOSIS — F331 Major depressive disorder, recurrent, moderate: Secondary | ICD-10-CM

## 2018-06-17 MED ORDER — TRAZODONE HCL 50 MG PO TABS: 50.0000 mg | ORAL_TABLET | Freq: Every day | ORAL | 0 refills | Status: DC

## 2018-06-17 NOTE — Progress Notes (Signed)
Called in prescription into pharmacy

## 2018-06-21 ENCOUNTER — Other Ambulatory Visit: Payer: Self-pay | Admitting: Internal Medicine

## 2018-06-21 DIAGNOSIS — R112 Nausea with vomiting, unspecified: Secondary | ICD-10-CM

## 2018-06-21 MED ORDER — ONDANSETRON 4 MG PO TBDP: 4.0000 mg | ORAL_TABLET | Freq: Two times a day (BID) | ORAL | 1 refills | Status: DC | PRN

## 2018-06-27 ENCOUNTER — Telehealth (HOSPITAL_COMMUNITY): Payer: Self-pay

## 2018-06-27 ENCOUNTER — Telehealth: Payer: Self-pay | Admitting: Internal Medicine

## 2018-06-27 ENCOUNTER — Telehealth: Payer: Self-pay | Admitting: *Deleted

## 2018-06-27 DIAGNOSIS — F900 Attention-deficit hyperactivity disorder, predominantly inattentive type: Secondary | ICD-10-CM

## 2018-06-27 MED ORDER — METHYLPHENIDATE HCL 5 MG PO TABS: 5.0000 mg | ORAL_TABLET | ORAL | 0 refills | Status: DC

## 2018-06-27 NOTE — Telephone Encounter (Signed)
Patient is calling for a refill on her Ritalin, she uses [facility] in [place], thank you

## 2018-06-27 NOTE — Telephone Encounter (Signed)
Copied from [place] [phone]. Topic: General - Other >> Jun 27, 2018  3:11 PM Windy Kalata wrote: Reason for CRM: Rhea from [facility] is calling about a medication that was denied  Call back is [phone]

## 2018-06-27 NOTE — Telephone Encounter (Signed)
Copied from [place] [id number]. Topic: General - Other >> Jun 27, 2018  3:11 PM Windy Kalata wrote: Reason for CRM: Rhea from [facility] is calling about a medication that was denied  Call back is [phone]

## 2018-06-27 NOTE — Telephone Encounter (Signed)
[employer] have already spoken to someone here in office and medication was denied.

## 2018-06-27 NOTE — Telephone Encounter (Signed)
Prescription sent to her pharmacy.  Please call patient.

## 2018-07-13 ENCOUNTER — Ambulatory Visit: Payer: Medicare Other

## 2018-07-13 ENCOUNTER — Inpatient Hospital Stay: Payer: Medicare Other | Attending: Oncology

## 2018-07-13 ENCOUNTER — Other Ambulatory Visit: Payer: Self-pay

## 2018-07-13 ENCOUNTER — Other Ambulatory Visit: Payer: Self-pay | Admitting: *Deleted

## 2018-07-13 DIAGNOSIS — D500 Iron deficiency anemia secondary to blood loss (chronic): Secondary | ICD-10-CM

## 2018-07-13 DIAGNOSIS — Z85038 Personal history of other malignant neoplasm of large intestine: Secondary | ICD-10-CM | POA: Diagnosis not present

## 2018-07-13 DIAGNOSIS — D509 Iron deficiency anemia, unspecified: Secondary | ICD-10-CM | POA: Diagnosis not present

## 2018-07-13 LAB — CBC WITH DIFFERENTIAL/PLATELET
Abs Immature Granulocytes: 0.02 10*3/uL (ref 0.00–0.07)
BASOS PCT: 1 %
Basophils Absolute: 0.1 10*3/uL (ref 0.0–0.1)
Eosinophils Absolute: 0 10*3/uL (ref 0.0–0.5)
Eosinophils Relative: 0 %
HCT: 40.1 % (ref 36.0–46.0)
Hemoglobin: 13.4 g/dL (ref 12.0–15.0)
Immature Granulocytes: 0 %
Lymphocytes Relative: 36 %
Lymphs Abs: 3.6 10*3/uL (ref 0.7–4.0)
MCH: 30.5 pg (ref 26.0–34.0)
MCHC: 33.4 g/dL (ref 30.0–36.0)
MCV: 91.3 fL (ref 80.0–100.0)
Monocytes Absolute: 0.5 10*3/uL (ref 0.1–1.0)
Monocytes Relative: 5 %
Neutro Abs: 5.8 10*3/uL (ref 1.7–7.7)
Neutrophils Relative %: 58 %
Platelets: 238 10*3/uL (ref 150–400)
RBC: 4.39 MIL/uL (ref 3.87–5.11)
RDW: 12.7 % (ref 11.5–15.5)
WBC: 10 10*3/uL (ref 4.0–10.5)
nRBC: 0 % (ref 0.0–0.2)

## 2018-07-13 LAB — COMPREHENSIVE METABOLIC PANEL
ALK PHOS: 72 U/L (ref 38–126)
ALT: 18 U/L (ref 0–44)
AST: 21 U/L (ref 15–41)
Albumin: 4.3 g/dL (ref 3.5–5.0)
Anion gap: 6 (ref 5–15)
BUN: 12 mg/dL (ref 8–23)
CALCIUM: 9.3 mg/dL (ref 8.9–10.3)
CO2: 29 mmol/L (ref 22–32)
Chloride: 103 mmol/L (ref 98–111)
Creatinine, Ser: 1.17 mg/dL — ABNORMAL HIGH (ref 0.44–1.00)
GFR calc Af Amer: 54 mL/min — ABNORMAL LOW (ref >60)
GFR, EST NON AFRICAN AMERICAN: 47 mL/min — AB (ref >60)
Glucose, Bld: 107 mg/dL — ABNORMAL HIGH (ref 70–99)
Potassium: 4.2 mmol/L (ref 3.5–5.1)
Sodium: 138 mmol/L (ref 135–145)
TOTAL PROTEIN: 6.9 g/dL (ref 6.5–8.1)
Total Bilirubin: 0.6 mg/dL (ref 0.3–1.2)

## 2018-07-14 ENCOUNTER — Other Ambulatory Visit (INDEPENDENT_AMBULATORY_CARE_PROVIDER_SITE_OTHER): Payer: Medicare Other

## 2018-07-14 ENCOUNTER — Other Ambulatory Visit: Payer: Self-pay

## 2018-07-14 ENCOUNTER — Telehealth: Payer: Self-pay

## 2018-07-14 DIAGNOSIS — E039 Hypothyroidism, unspecified: Secondary | ICD-10-CM

## 2018-07-14 DIAGNOSIS — Z13818 Encounter for screening for other digestive system disorders: Secondary | ICD-10-CM | POA: Diagnosis not present

## 2018-07-14 LAB — CEA: CEA1: 1.5 ng/mL (ref 0.0–4.7)

## 2018-07-14 LAB — TSH: TSH: 2.67 u[IU]/mL (ref 0.35–4.50)

## 2018-07-14 NOTE — Telephone Encounter (Signed)
Patient wants PCP to know that she is currently not taking thyroid medication

## 2018-07-15 ENCOUNTER — Ambulatory Visit: Payer: Self-pay | Admitting: Oncology

## 2018-07-15 LAB — HEPATITIS C ANTIBODY
Hepatitis C Ab: NONREACTIVE
SIGNAL TO CUT-OFF: 0.03 (ref <1.00)

## 2018-07-17 NOTE — Telephone Encounter (Signed)
Why would she stop taking her thyroid medication?   [place]

## 2018-07-19 ENCOUNTER — Other Ambulatory Visit (HOSPITAL_COMMUNITY): Payer: Self-pay

## 2018-07-19 DIAGNOSIS — F331 Major depressive disorder, recurrent, moderate: Secondary | ICD-10-CM

## 2018-07-19 MED ORDER — TRAZODONE HCL 50 MG PO TABS: 50.0000 mg | ORAL_TABLET | Freq: Every day | ORAL | 0 refills | Status: DC

## 2018-07-21 ENCOUNTER — Inpatient Hospital Stay: Payer: Medicare Other | Admitting: Oncology

## 2018-08-02 ENCOUNTER — Telehealth (HOSPITAL_COMMUNITY): Payer: Self-pay

## 2018-08-02 DIAGNOSIS — F900 Attention-deficit hyperactivity disorder, predominantly inattentive type: Secondary | ICD-10-CM

## 2018-08-02 MED ORDER — METHYLPHENIDATE HCL 5 MG PO TABS: 5.0000 mg | ORAL_TABLET | ORAL | 0 refills | Status: DC

## 2018-08-02 NOTE — Telephone Encounter (Signed)
Patient is calling for a refill on her Ritalin, she uses [facility]

## 2018-08-02 NOTE — Telephone Encounter (Signed)
done

## 2018-08-10 ENCOUNTER — Other Ambulatory Visit: Payer: Self-pay

## 2018-08-10 ENCOUNTER — Ambulatory Visit (INDEPENDENT_AMBULATORY_CARE_PROVIDER_SITE_OTHER): Payer: Medicare Other | Admitting: Psychiatry

## 2018-08-10 DIAGNOSIS — F331 Major depressive disorder, recurrent, moderate: Secondary | ICD-10-CM

## 2018-08-10 DIAGNOSIS — F900 Attention-deficit hyperactivity disorder, predominantly inattentive type: Secondary | ICD-10-CM | POA: Diagnosis not present

## 2018-08-10 MED ORDER — TRINTELLIX 20 MG PO TABS: 20.0000 mg | ORAL_TABLET | ORAL | 2 refills | Status: DC

## 2018-08-10 MED ORDER — METHYLPHENIDATE HCL 5 MG PO TABS: 5.0000 mg | ORAL_TABLET | ORAL | 0 refills | Status: DC

## 2018-08-10 MED ORDER — TRAZODONE HCL 50 MG PO TABS: 50.0000 mg | ORAL_TABLET | Freq: Every day | ORAL | 1 refills | Status: DC

## 2018-08-10 MED ORDER — LAMOTRIGINE 200 MG PO TABS: 200.0000 mg | ORAL_TABLET | ORAL | 0 refills | Status: DC

## 2018-08-10 NOTE — Progress Notes (Signed)
Virtual Visit via Telephone Note  I connected with [name] on 08/10/18 at  2:20 PM EDT by telephone and verified that I am speaking with the correct person using two identifiers.   I discussed the limitations, risks, security and privacy concerns of performing an evaluation and management service by telephone and the availability of in person appointments. I also discussed with the patient that there may be a patient responsible charge related to this service. The patient expressed understanding and agreed to proceed.   History of Present Illness: Patient was evaluated through phone session.  She is taking her medication as prescribed.  She admitted some anxiety due to pandemic coronavirus but she is staying home and trying to get in touch with kids through face time.  She lives with her husband who is very supportive.  She is sleeping better.  Her energy level is okay.  She denies any tremors, shakes or any side effects.  She is sleeping 7 to 8 hours.  She denies any irritability, anger, mania.  She denies any crying spells or any feeling of hopelessness.  She like to continue her current medication.  She saw GI who recommended endoscopy for nausea but due to current situation she has not able to get appointment.  Patient denies drinking or using any illegal substances.  Past Psychiatric History: Reviewed. History of mania, irritability, impulsive behavior and overdose at age 73.  Admitted twice in the past.  Diagnosed with ADD and had a psychological testing.  Took Concerta, (Adderall, Vyvanse make her anxious) Wellbutrin, Prozac, lithium, Effexor, Lexapro, Abilify and Cymbalta.  She recall best treatment when she was given a female hormone to help endometriosis.  Had a history of ECT which failed.  Had a [place] which helped her.  No history of psychosis or any hallucination.   Observations/Objective: Limited mental status examination done on the phone.  Patient describes her mood okay.   Her speech is clear, coherent.  Her thought process logical and goal-directed.  She denies any auditory or visual hallucination.  She denies active or passive suicidal thoughts or homicidal thought.  She is alert and oriented x3.  Her attention and concentration is okay.  Her fund of knowledge is adequate.  Her insight and judgment is okay.  Assessment and Plan: Major depressive disorder, recurrent.  Attention deficit disorder, inattentive type.  Patient doing better on her current medication.  She like to get colonoscopy or endoscopy for her chronic GI symptoms which is nausea.  She is hoping once pandemic coronavirus Resolve then she will do these tests.  She is taking her medication which is helping her attention, focus, depression and multitasking.  She reported no side effects.  I will continue trazodone 50 mg at bedtime, Trintellix 20 mg daily, Lamictal 200 mg daily and Ritalin 5 mg in the morning.  She has no rash or itching.  Recommended to call us back if she has any question or any concern.  Follow-up in 3 months.  Follow Up Instructions:    I discussed the assessment and treatment plan with the patient. The patient was provided an opportunity to ask questions and all were answered. The patient agreed with the plan and demonstrated an understanding of the instructions.   The patient was advised to call back or seek an in-person evaluation if the symptoms worsen or if the condition fails to improve as anticipated.  I provided 20 minutes of non-face-to-face time during this encounter.   Kathlee Nations, MD

## 2018-08-16 ENCOUNTER — Telehealth: Payer: Self-pay | Admitting: *Deleted

## 2018-08-16 NOTE — Telephone Encounter (Signed)
Called patient tl  ;

## 2018-08-17 ENCOUNTER — Other Ambulatory Visit: Payer: Self-pay

## 2018-08-18 ENCOUNTER — Encounter: Payer: Self-pay | Admitting: Oncology

## 2018-08-18 ENCOUNTER — Inpatient Hospital Stay: Payer: Medicare Other | Attending: Oncology | Admitting: Oncology

## 2018-08-18 ENCOUNTER — Other Ambulatory Visit: Payer: Self-pay

## 2018-08-18 DIAGNOSIS — C182 Malignant neoplasm of ascending colon: Secondary | ICD-10-CM | POA: Diagnosis not present

## 2018-08-18 DIAGNOSIS — N183 Chronic kidney disease, stage 3 unspecified: Secondary | ICD-10-CM

## 2018-08-18 NOTE — Progress Notes (Signed)
Called patient for [facility] visit.  Patient states no new concerns today

## 2018-08-18 NOTE — Progress Notes (Signed)
HEMATOLOGY-ONCOLOGY TeleHEALTH VISIT PROGRESS NOTE  I connected with [name] on 08/18/18 at 10:00 AM EDT by video enabled telemedicine visit and verified that I am speaking with the correct person using two identifiers. I discussed the limitations, risks, security and privacy concerns of performing an evaluation and management service by telemedicine and the availability of in-person appointments. I also discussed with the patient that there may be a patient responsible charge related to this service. The patient expressed understanding and agreed to proceed.   Other persons participating in the visit and their role in the encounter:  Geraldine Solar, CMA, check in patient    Patient's location: Home  Provider's location: Home  Chief Complaint: 6 months follow up for Stage II colon cancer.    INTERVAL HISTORY [name] is a 73 y.o. female who has above history reviewed by me today presents for follow up visit for stage II colon cancer Problems and complaints are listed below:  She report doing well. Appetite is good, denies any bowel habit changes or blood in stool.  Reports that she has gain a few pounds too.  Denies any pain today.   Review of Systems  Constitutional: Negative for appetite change, chills, fatigue and fever.  HENT:   Negative for hearing loss and voice change.   Eyes: Negative for eye problems.  Respiratory: Negative for chest tightness and cough.   Cardiovascular: Negative for chest pain.  Gastrointestinal: Negative for abdominal distention, abdominal pain and blood in stool.  Endocrine: Negative for hot flashes.  Genitourinary: Negative for difficulty urinating and frequency.   Musculoskeletal: Negative for arthralgias.  Skin: Negative for itching and rash.  Neurological: Negative for extremity weakness.  Hematological: Negative for adenopathy.  Psychiatric/Behavioral: Negative for confusion.    Past Medical History:  Diagnosis Date   . Allergy   . Anemia   . Carcinoma ([place])   . Colon cancer ([place]) 01/2017  . Colon polyps   . Diverticulitis    01/2018 abscess hosp. 01/2018  . Endometriosis   . Fibromyalgia   . Gastric polyps   . GERD (gastroesophageal reflux disease)   . Hemorrhoids   . Hypothyroidism   . Iron deficiency anemia 03/08/2017  . Major depressive disorder, recurrent episode, mild (HCC)    Dr. Adele Schilder in [place] h/o ECT x 6 x and hosp x 2 and transcranial magnetic treatment   . Miscarriage    x2  . PONV (postoperative nausea and vomiting)   . Skin cancer    arm, thigh and chest Dr. Evorn Gong   . Sleep apnea    USES CPAP  . UTI (urinary tract infection)    Past Surgical History:  Procedure Laterality Date  . ABDOMINAL HYSTERECTOMY  1990   endometriosis  . BREAST BIOPSY Left 2004   benign  . CATARACT EXTRACTION, BILATERAL Bilateral 05/2012  . CHOLECYSTECTOMY  2001  . COLONOSCOPY WITH PROPOFOL N/A 12/28/2016   Procedure: COLONOSCOPY WITH PROPOFOL;  Surgeon: Manya Silvas, MD;  Location: [facility] ENDOSCOPY;  Service: Endoscopy;  Laterality: N/A;  . COLONOSCOPY WITH PROPOFOL N/A 01/17/2018   Procedure: COLONOSCOPY WITH PROPOFOL;  Surgeon: Manya Silvas, MD;  Location: [facility] ENDOSCOPY;  Service: Endoscopy;  Laterality: N/A;  . ESOPHAGOGASTRODUODENOSCOPY (EGD) WITH PROPOFOL N/A 12/28/2016   Procedure: ESOPHAGOGASTRODUODENOSCOPY (EGD) WITH PROPOFOL;  Surgeon: Manya Silvas, MD;  Location[facility] ENDOSCOPY;  Service: Endoscopy;  Laterality: N/A;  . EYE SURGERY     lens left eye, cataract right eye   . LAPAROSCOPIC  RIGHT COLECTOMY Right 01/21/2017   Procedure: LAPAROSCOPIC ASSISTED RIGHT COLECTOMY;  Surgeon: Robert Bellow, MD;  Location: [facility] ORS;  Service: General;  Laterality: Right;  . TONSILLECTOMY      Family History  Problem Relation Age of Onset  . Depression Brother   . Alcohol abuse Brother   . Suicidality Brother   . Stroke Mother   . Hyperlipidemia Mother   . Hypertension Mother   .  Clotting disorder Brother   . Cancer Brother        MDS  . Colon polyps Father   . Melanoma Sister   . Cancer Sister        skin MM  . Stroke Maternal Grandfather   . Bipolar disorder Neg Hx     Social History   Socioeconomic History  . Marital status: Married    Spouse name: Not on file  . Number of children: Not on file  . Years of education: Not on file  . Highest education level: Not on file  Occupational History  . Not on file  Social Needs  . Financial resource strain: Not on file  . Food insecurity:    Worry: Not on file    Inability: Not on file  . Transportation needs:    Medical: Not on file    Non-medical: Not on file  Tobacco Use  . Smoking status: Never Smoker  . Smokeless tobacco: Never Used  Substance and Sexual Activity  . Alcohol use: Not Currently    Alcohol/week: 0.0 standard drinks    Comment:    . Drug use: No  . Sexual activity: Yes    Partners: Male    Birth control/protection: None  Lifestyle  . Physical activity:    Days per week: Not on file    Minutes per session: Not on file  . Stress: Not on file  Relationships  . Social connections:    Talks on phone: Not on file    Gets together: Not on file    Attends religious service: Not on file    Active member of club or organization: Not on file    Attends meetings of clubs or organizations: Not on file    Relationship status: Not on file  . Intimate partner violence:    Fear of current or ex partner: Not on file    Emotionally abused: Not on file    Physically abused: Not on file    Forced sexual activity: Not on file  Other Topics Concern  . 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 calcium-vitamin D (OSCAL WITH D) 500-200 MG-UNIT tablet Take 2 tablets by  mouth 2 (two) times daily.    . cetirizine (ZYRTEC) 10 MG tablet Take 10 mg by mouth daily as needed for allergies.     Marland Kitchen estradiol (ESTRACE) 0.5 MG tablet Take 1 tablet (0.5 mg total) by mouth daily. 90 tablet 3  . lamoTRIgine (LAMICTAL) 200 MG tablet Take 1 tablet (200 mg total) by mouth every morning. 90 tablet 0  . methylphenidate (RITALIN) 5 MG tablet Take 1 tablet (5 mg total) by mouth every morning. 30 tablet 0  . montelukast (SINGULAIR) 10 MG tablet Take  1 tablet (10 mg total) by mouth at bedtime. 30 tablet 3  . Multiple Vitamins-Minerals (MULTIVITAMIN WITH MINERALS) tablet Take 1 tablet by mouth daily.    Marland Kitchen omeprazole (PRILOSEC) 20 MG capsule Take 1 capsule (20 mg total) by mouth daily before breakfast. 90 capsule 3  . ondansetron (ZOFRAN-ODT) 4 MG disintegrating tablet Take 1 tablet (4 mg total) by mouth 2 (two) times daily as needed for nausea or vomiting. AND PRN 90 tablet 1  . traZODone (DESYREL) 50 MG tablet Take 1 tablet (50 mg total) by mouth at bedtime. 30 tablet 1  . TRINTELLIX 20 MG TABS tablet Take 1 tablet (20 mg total) by mouth every morning. 30 tablet 2  . [DISCONTINUED] prochlorperazine (COMPAZINE) 10 MG tablet Take 1 tablet (10 mg total) every 6 (six) hours as needed by mouth (Nausea or vomiting). 30 tablet 1   No current facility-administered medications on file prior to visit.     Allergies  Allergen Reactions  . Other     BAND-AIDS IF LEFT ON FOR EXTENDED PERIODS-REDNESS  . Codeine     Nausea       Observations/Objective: Today's Vitals   08/18/18 0951  PainSc: 0-No pain   There is no height or weight on file to calculate BMI.  Physical Exam  Constitutional: She is oriented to person, place, and time and well-developed, well-nourished, and in no distress. No distress.  HENT:  Head: Normocephalic.  Neck: Normal range of motion.  Pulmonary/Chest: Effort normal. No respiratory distress.  Neurological: She is alert and oriented to person, place, and time.   Psychiatric: Affect normal.    CBC    Component Value Date/Time   WBC 10.0 07/13/2018 0959   RBC 4.39 07/13/2018 0959   HGB 13.4 07/13/2018 0959   HGB 13.7 11/14/2013 1331   HCT 40.1 07/13/2018 0959   HCT 41.0 11/14/2013 1331   PLT 238 07/13/2018 0959   PLT 302 11/14/2013 1331   MCV 91.3 07/13/2018 0959   MCV 92 11/14/2013 1331   MCH 30.5 07/13/2018 0959   MCHC 33.4 07/13/2018 0959   RDW 12.7 07/13/2018 0959   RDW 13.2 01/02/2014 1418   RDW 13.0 11/14/2013 1331   LYMPHSABS 3.6 07/13/2018 0959   LYMPHSABS 3.5 (H) 01/02/2014 1418   LYMPHSABS 3.7 (H) 11/14/2013 1331   MONOABS 0.5 07/13/2018 0959   MONOABS 0.5 11/14/2013 1331   EOSABS 0.0 07/13/2018 0959   EOSABS 0.0 01/02/2014 1418   EOSABS 0.1 11/14/2013 1331   BASOSABS 0.1 07/13/2018 0959   BASOSABS 0.0 01/02/2014 1418   BASOSABS 0.1 11/14/2013 1331    CMP     Component Value Date/Time   NA 138 07/13/2018 0959   NA 141 11/14/2013 1331   K 4.2 07/13/2018 0959   K 4.3 11/14/2013 1331   CL 103 07/13/2018 0959   CL 108 (H) 11/14/2013 1331   CO2 29 07/13/2018 0959   CO2 27 11/14/2013 1331   GLUCOSE 107 (H) 07/13/2018 0959   GLUCOSE 120 (H) 11/14/2013 1331   BUN 12 07/13/2018 0959   BUN 10 11/14/2013 1331   CREATININE 1.17 (H) 07/13/2018 0959   CREATININE 1.11 11/14/2013 1331   CALCIUM 9.3 07/13/2018 0959   CALCIUM 9.1 11/14/2013 1331   PROT 6.9 07/13/2018 0959   PROT 7.2 11/14/2013 1331   ALBUMIN 4.3 07/13/2018 0959   ALBUMIN 3.5 11/14/2013 1331   AST 21 07/13/2018 0959   AST 20 11/14/2013 1331   ALT 18 07/13/2018 0959   ALT  26 11/14/2013 1331   ALKPHOS 72 07/13/2018 0959   ALKPHOS 79 11/14/2013 1331   BILITOT 0.6 07/13/2018 0959   BILITOT 0.3 11/14/2013 1331   GFRNONAA 47 (L) 07/13/2018 0959   GFRNONAA 52 (L) 11/14/2013 1331   GFRAA 54 (L) 07/13/2018 0959   GFRAA 60 (L) 11/14/2013 1331     Assessment and Plan: 1. Cancer of right colon ([place])   2. Stage 3 chronic kidney disease ([place])     Labs are  reviewed and discussed with patient.  Stable normal hemoglobin. Anemia has resolved.  CEA stable.  Recommend labs including CEA Q3 months and CT scan Q6 months surveillance.  Elevated creatinine, advise patient to stay well hydrated.  She has pcp follow up in 3 weeks and I recommend her to have repeat kidney function panel done for follow up.  I will obtain CT chest /abd/Pelvis wo contrast in 3 months.    Follow Up Instructions: 3 months, Lab MD assessment and discussion CT results.  Orders Placed This Encounter  Procedures  . CT Chest Wo Contrast    Standing Status:   Future    Standing Expiration Date:   08/18/2019    Order Specific Question:   Preferred imaging location?    Answer:   [facility]    Order Specific Question:   Radiology Contrast Protocol - do NOT remove file path    Answer:   \\[email or web address]  . CT Abdomen Pelvis Wo Contrast    Standing Status:   Future    Standing Expiration Date:   08/18/2019    Order Specific Question:   Preferred imaging location?    Answer:   [facility]    Order Specific Question:   Is Oral Contrast requested for this exam?    Answer:   Yes, Per Radiology protocol    Order Specific Question:   Radiology Contrast Protocol - do NOT remove file path    Answer:   \\[email or web address]  . CEA    Standing Status:   Future    Standing Expiration Date:   08/18/2019  . CBC with Differential/Platelet    Standing Status:   Future    Standing Expiration Date:   08/18/2019  . Comprehensive metabolic panel    Standing Status:   Future    Standing Expiration Date:   08/18/2019    I discussed the assessment and treatment plan with the patient. The patient was provided an opportunity to ask questions and all were answered. The patient agreed with the plan and demonstrated an understanding of the instructions.  The patient was advised to call back or seek an in-person evaluation if the  symptoms worsen or if the condition fails to improve as anticipated.   Earlie Server, MD 08/18/2018 11:21 AM

## 2018-09-07 DIAGNOSIS — H2512 Age-related nuclear cataract, left eye: Secondary | ICD-10-CM | POA: Diagnosis not present

## 2018-09-29 ENCOUNTER — Other Ambulatory Visit: Payer: Self-pay

## 2018-09-29 ENCOUNTER — Ambulatory Visit (INDEPENDENT_AMBULATORY_CARE_PROVIDER_SITE_OTHER): Payer: Medicare Other | Admitting: Internal Medicine

## 2018-09-29 DIAGNOSIS — E039 Hypothyroidism, unspecified: Secondary | ICD-10-CM

## 2018-09-29 DIAGNOSIS — R7989 Other specified abnormal findings of blood chemistry: Secondary | ICD-10-CM | POA: Diagnosis not present

## 2018-09-29 DIAGNOSIS — R5383 Other fatigue: Secondary | ICD-10-CM | POA: Diagnosis not present

## 2018-09-29 DIAGNOSIS — F332 Major depressive disorder, recurrent severe without psychotic features: Secondary | ICD-10-CM | POA: Diagnosis not present

## 2018-09-29 DIAGNOSIS — R51 Headache: Secondary | ICD-10-CM

## 2018-09-29 DIAGNOSIS — R519 Headache, unspecified: Secondary | ICD-10-CM

## 2018-09-29 NOTE — Progress Notes (Signed)
Virtual Visit via Video Note  I connected with [name]   on 09/29/18 at 10:30 AM EDT by a video enabled telemedicine application and verified that I am speaking with the correct person using two identifiers.  Location patient: home Location provider:work  Persons participating in the virtual visit: patient, provider  I discussed the limitations of evaluation and management by telemedicine and the availability of in person appointments. The patient expressed understanding and agreed to proceed.   HPI: 1. Hypothyroidism she stopped thyroid medications levo thyroxine 100 mcg then went down to 88 mcg and took it every other day and stopped 07/2018 due to it causing increased fatigue. She has been off the medication since 07/2018 and for the last 2-3 days c/o increased fatigue  2. Depression mood ok she has seen Dr. Marchia Bond and doing well no change in medications  3.mild h/a today no h/o migraines drinking only 1 cup coffee in the am normally she does not get h/a but when she does she drinks more water and does better h/a goes away. She has prn Tylenol to take for h/as  4. Abnormal mammogram 01/2018 she will call [place] to schedule right dx mammogram and [name]   ROS: See pertinent positives and negatives per HPI.  Past Medical History:  Diagnosis Date  . Allergy   . Anemia   . Carcinoma ([place])   . Colon cancer ([place]) 01/2017  . Colon polyps   . Diverticulitis    01/2018 abscess hosp. 01/2018  . Endometriosis   . Fibromyalgia   . Gastric polyps   . GERD (gastroesophageal reflux disease)   . Hemorrhoids   . Hypothyroidism   . Iron deficiency anemia 03/08/2017  . Major depressive disorder, recurrent episode, mild (HCC)    Dr. Adele Schilder in [place] h/o ECT x 6 x and hosp x 2 and transcranial magnetic treatment   . Miscarriage    x2  . PONV (postoperative nausea and vomiting)   . Skin cancer    arm, thigh and chest Dr. Evorn Gong   . Sleep apnea    USES CPAP  . UTI (urinary tract infection)      Past Surgical History:  Procedure Laterality Date  . ABDOMINAL HYSTERECTOMY  1990   endometriosis  . BREAST BIOPSY Left 2004   benign  . CATARACT EXTRACTION, BILATERAL Bilateral 05/2012  . CHOLECYSTECTOMY  2001  . COLONOSCOPY WITH PROPOFOL N/A 12/28/2016   Procedure: COLONOSCOPY WITH PROPOFOL;  Surgeon: Manya Silvas, MD;  Location: [facility] ENDOSCOPY;  Service: Endoscopy;  Laterality: N/A;  . COLONOSCOPY WITH PROPOFOL N/A 01/17/2018   Procedure: COLONOSCOPY WITH PROPOFOL;  Surgeon: Manya Silvas, MD;  Location: [facility] ENDOSCOPY;  Service: Endoscopy;  Laterality: N/A;  . ESOPHAGOGASTRODUODENOSCOPY (EGD) WITH PROPOFOL N/A 12/28/2016   Procedure: ESOPHAGOGASTRODUODENOSCOPY (EGD) WITH PROPOFOL;  Surgeon: Manya Silvas, MD;  Location: [facility] ENDOSCOPY;  Service: Endoscopy;  Laterality: N/A;  . EYE SURGERY     lens left eye, cataract right eye   . LAPAROSCOPIC RIGHT COLECTOMY Right 01/21/2017   Procedure: LAPAROSCOPIC ASSISTED RIGHT COLECTOMY;  Surgeon: Robert Bellow, MD;  Location: [facility] ORS;  Service: General;  Laterality: Right;  . TONSILLECTOMY      Family History  Problem Relation Age of Onset  . Depression Brother   . Alcohol abuse Brother   . Suicidality Brother   . Stroke Mother   . Hyperlipidemia Mother   . Hypertension Mother   . Clotting disorder Brother   . Cancer Brother  MDS  . Colon polyps Father   . Melanoma Sister   . Cancer Sister        skin MM  . Stroke Maternal Grandfather   . Bipolar disorder Neg Hx     SOCIAL HX: married    Current Outpatient Medications:  .  calcium-vitamin D (OSCAL WITH D) 500-200 MG-UNIT tablet, Take 2 tablets by mouth 2 (two) times daily., Disp: , Rfl:  .  cetirizine (ZYRTEC) 10 MG tablet, Take 10 mg by mouth daily as needed for allergies. , Disp: , Rfl:  .  estradiol (ESTRACE) 0.5 MG tablet, Take 1 tablet (0.5 mg total) by mouth daily., Disp: 90 tablet, Rfl: 3 .  lamoTRIgine (LAMICTAL) 200 MG tablet, Take 1 tablet  (200 mg total) by mouth every morning., Disp: 90 tablet, Rfl: 0 .  methylphenidate (RITALIN) 5 MG tablet, Take 1 tablet (5 mg total) by mouth every morning., Disp: 30 tablet, Rfl: 0 .  montelukast (SINGULAIR) 10 MG tablet, Take 1 tablet (10 mg total) by mouth at bedtime., Disp: 30 tablet, Rfl: 3 .  Multiple Vitamins-Minerals (MULTIVITAMIN WITH MINERALS) tablet, Take 1 tablet by mouth daily., Disp: , Rfl:  .  omeprazole (PRILOSEC) 20 MG capsule, Take 1 capsule (20 mg total) by mouth daily before breakfast., Disp: 90 capsule, Rfl: 3 .  ondansetron (ZOFRAN-ODT) 4 MG disintegrating tablet, Take 1 tablet (4 mg total) by mouth 2 (two) times daily as needed for nausea or vomiting. AND PRN, Disp: 90 tablet, Rfl: 1 .  traZODone (DESYREL) 50 MG tablet, Take 1 tablet (50 mg total) by mouth at bedtime., Disp: 30 tablet, Rfl: 1 .  TRINTELLIX 20 MG TABS tablet, Take 1 tablet (20 mg total) by mouth every morning., Disp: 30 tablet, Rfl: 2  EXAM:  VITALS per patient if applicable:  GENERAL: alert, oriented, appears well and in no acute distress  HEENT: atraumatic, conjunttiva clear, no obvious abnormalities on inspection of external nose and ears  NECK: normal movements of the head and neck  LUNGS: on inspection no signs of respiratory distress, breathing rate appears normal, no obvious gross SOB, gasping or wheezing  CV: no obvious cyanosis  MS: moves all visible extremities without noticeable abnormality  PSYCH/NEURO: pleasant and cooperative, no obvious depression or anxiety, speech and thought processing grossly intact  ASSESSMENT AND PLAN:  Discussed the following assessment and plan:  Severe episode of recurrent major depressive disorder, without psychotic features (HCC) -depression stable now and doing well will f/u with psych Dr. Marchia Bond   Hypothyroidism, unspecified type - Plan: TSH she is currently off levothyroxine   Fatigue, unspecified type - Plan: Basic Metabolic Panel (BMET)-see will  check TSH, bmet   Elevated serum creatinine - Plan: Basic Metabolic Panel (BMET)  Nonintractable headache -increase water, prn tylenol call back if not resolved   HM Flu shot per pt had 02/2018 hospital volunteer though reg vaccine not high dose  Tdap 02/15/14 and 08/10/18 utd prevnar pna 23 05/31/18 Had shingrix vaccine old one she thinks, consider shingrixgiven Rx today  -per old notes had 03/2013 zostervax hep C negative   mammo 01/06/18 rec Dx mammo and [name] of right breast in 6 months needs to call to sch has not yet but will do today 09/29/2018  S/p hysterectomy out of age window pap -pap 10/15/14 negative neg HPV Colonoscopy 01/17/18 diverticulosis, [facility] Dr. Tiffany Kocher repeat in 2 years h/o colon cancer DEXA 12/21/17 osteopenia   Light smoker age 32 y.o   Former PCP Dr. Latricia Heft  Humphrey Rolls requested records today including carotid US from 01/2018 in system w/o result. Pt to call/send letter to get results faxed to [name]    I discussed the assessment and treatment plan with the patient. The patient was provided an opportunity to ask questions and all were answered. The patient agreed with the plan and demonstrated an understanding of the instructions.   The patient was advised to call back or seek an in-person evaluation if the symptoms worsen or if the condition fails to improve as anticipated.  Time spent 15 minutes  [name] Jackson, MD

## 2018-10-06 ENCOUNTER — Telehealth (HOSPITAL_COMMUNITY): Payer: Self-pay

## 2018-10-06 DIAGNOSIS — F900 Attention-deficit hyperactivity disorder, predominantly inattentive type: Secondary | ICD-10-CM

## 2018-10-06 MED ORDER — METHYLPHENIDATE HCL 5 MG PO TABS: 5.0000 mg | ORAL_TABLET | ORAL | 0 refills | Status: DC

## 2018-10-06 NOTE — Telephone Encounter (Signed)
done

## 2018-10-10 ENCOUNTER — Other Ambulatory Visit: Payer: Self-pay

## 2018-10-10 ENCOUNTER — Other Ambulatory Visit (INDEPENDENT_AMBULATORY_CARE_PROVIDER_SITE_OTHER): Payer: Medicare Other

## 2018-10-10 DIAGNOSIS — R5383 Other fatigue: Secondary | ICD-10-CM

## 2018-10-10 DIAGNOSIS — E039 Hypothyroidism, unspecified: Secondary | ICD-10-CM

## 2018-10-10 DIAGNOSIS — G4733 Obstructive sleep apnea (adult) (pediatric): Secondary | ICD-10-CM | POA: Diagnosis not present

## 2018-10-10 DIAGNOSIS — R7989 Other specified abnormal findings of blood chemistry: Secondary | ICD-10-CM | POA: Diagnosis not present

## 2018-10-10 DIAGNOSIS — H2511 Age-related nuclear cataract, right eye: Secondary | ICD-10-CM | POA: Diagnosis not present

## 2018-10-10 LAB — BASIC METABOLIC PANEL
BUN: 13 mg/dL (ref 6–23)
CO2: 29 mEq/L (ref 19–32)
Calcium: 9.3 mg/dL (ref 8.4–10.5)
Chloride: 103 mEq/L (ref 96–112)
Creatinine, Ser: 1.07 mg/dL (ref 0.40–1.20)
GFR: 50.5 mL/min — ABNORMAL LOW (ref >60.00)
Glucose, Bld: 83 mg/dL (ref 70–99)
Potassium: 3.9 mEq/L (ref 3.5–5.1)
Sodium: 139 mEq/L (ref 135–145)

## 2018-10-10 LAB — TSH: TSH: 3.79 u[IU]/mL (ref 0.35–4.50)

## 2018-10-11 ENCOUNTER — Ambulatory Visit
Admission: RE | Admit: 2018-10-11 | Discharge: 2018-10-11 | Disposition: A | Discharge status: Home / Self Care | Payer: Medicare Other | Source: Ambulatory Visit | Attending: Internal Medicine | Admitting: Internal Medicine

## 2018-10-11 DIAGNOSIS — N6489 Other specified disorders of breast: Secondary | ICD-10-CM | POA: Diagnosis not present

## 2018-10-11 DIAGNOSIS — R928 Other abnormal and inconclusive findings on diagnostic imaging of breast: Secondary | ICD-10-CM | POA: Diagnosis not present

## 2018-10-11 DIAGNOSIS — N6310 Unspecified lump in the right breast, unspecified quadrant: Secondary | ICD-10-CM | POA: Diagnosis present

## 2018-10-11 DIAGNOSIS — N6313 Unspecified lump in the right breast, lower outer quadrant: Secondary | ICD-10-CM | POA: Diagnosis not present

## 2018-10-19 ENCOUNTER — Other Ambulatory Visit: Payer: Self-pay

## 2018-10-19 ENCOUNTER — Encounter: Payer: Self-pay | Admitting: *Deleted

## 2018-10-20 NOTE — Discharge Instructions (Signed)

## 2018-10-21 ENCOUNTER — Other Ambulatory Visit: Payer: Self-pay

## 2018-10-21 ENCOUNTER — Other Ambulatory Visit
Admission: RE | Admit: 2018-10-21 | Discharge: 2018-10-21 | Disposition: A | Discharge status: Home / Self Care | Payer: Medicare Other | Source: Ambulatory Visit | Attending: Ophthalmology | Admitting: Ophthalmology

## 2018-10-21 DIAGNOSIS — Z1159 Encounter for screening for other viral diseases: Secondary | ICD-10-CM | POA: Diagnosis not present

## 2018-10-22 LAB — NOVEL CORONAVIRUS, NAA (HOSP ORDER, SEND-OUT TO REF LAB; TAT 18-24 HRS): SARS-CoV-2, NAA: NOT DETECTED

## 2018-10-26 ENCOUNTER — Ambulatory Visit: Payer: Medicare Other | Admitting: Anesthesiology

## 2018-10-26 ENCOUNTER — Ambulatory Visit
Admission: RE | Admit: 2018-10-26 | Discharge: 2018-10-26 | Disposition: A | Discharge status: Home / Self Care | Payer: Medicare Other | Attending: Ophthalmology | Admitting: Ophthalmology

## 2018-10-26 ENCOUNTER — Encounter
Admission: RE | Disposition: A | Discharge status: Home / Self Care | Payer: Self-pay | Source: Home / Self Care | Attending: Ophthalmology

## 2018-10-26 DIAGNOSIS — H25811 Combined forms of age-related cataract, right eye: Secondary | ICD-10-CM | POA: Diagnosis not present

## 2018-10-26 DIAGNOSIS — E039 Hypothyroidism, unspecified: Secondary | ICD-10-CM | POA: Insufficient documentation

## 2018-10-26 DIAGNOSIS — Z85828 Personal history of other malignant neoplasm of skin: Secondary | ICD-10-CM | POA: Insufficient documentation

## 2018-10-26 DIAGNOSIS — K219 Gastro-esophageal reflux disease without esophagitis: Secondary | ICD-10-CM | POA: Insufficient documentation

## 2018-10-26 DIAGNOSIS — Z79899 Other long term (current) drug therapy: Secondary | ICD-10-CM | POA: Diagnosis not present

## 2018-10-26 DIAGNOSIS — F329 Major depressive disorder, single episode, unspecified: Secondary | ICD-10-CM | POA: Diagnosis not present

## 2018-10-26 DIAGNOSIS — Z85038 Personal history of other malignant neoplasm of large intestine: Secondary | ICD-10-CM | POA: Diagnosis not present

## 2018-10-26 DIAGNOSIS — G4730 Sleep apnea, unspecified: Secondary | ICD-10-CM | POA: Insufficient documentation

## 2018-10-26 DIAGNOSIS — Z79890 Hormone replacement therapy (postmenopausal): Secondary | ICD-10-CM | POA: Diagnosis not present

## 2018-10-26 DIAGNOSIS — Z9049 Acquired absence of other specified parts of digestive tract: Secondary | ICD-10-CM | POA: Diagnosis not present

## 2018-10-26 DIAGNOSIS — H2511 Age-related nuclear cataract, right eye: Secondary | ICD-10-CM | POA: Diagnosis not present

## 2018-10-26 HISTORY — PX: CATARACT EXTRACTION W/PHACO: SHX586

## 2018-10-26 SURGERY — PHACOEMULSIFICATION, CATARACT, WITH IOL INSERTION
Anesthesia: Monitor Anesthesia Care | Site: Eye | Laterality: Right

## 2018-10-26 MED ORDER — ARMC OPHTHALMIC DILATING DROPS
1.0000 "application " | OPHTHALMIC | Status: DC | PRN
  Administered 2018-10-26 (×3): 1 via OPHTHALMIC

## 2018-10-26 MED ORDER — MIDAZOLAM HCL 2 MG/2ML IJ SOLN
INTRAMUSCULAR | Status: DC | PRN
  Administered 2018-10-26: 2 mg via INTRAVENOUS

## 2018-10-26 MED ORDER — LACTATED RINGERS IV SOLN: INTRAVENOUS | Status: DC

## 2018-10-26 MED ORDER — MOXIFLOXACIN HCL 0.5 % OP SOLN
1.0000 [drp] | OPHTHALMIC | Status: DC | PRN
  Administered 2018-10-26 (×3): 1 [drp] via OPHTHALMIC

## 2018-10-26 MED ORDER — EPINEPHRINE PF 1 MG/ML IJ SOLN
INTRAOCULAR | Status: DC | PRN
  Administered 2018-10-26: 70 mL via OPHTHALMIC

## 2018-10-26 MED ORDER — TETRACAINE HCL 0.5 % OP SOLN
1.0000 [drp] | OPHTHALMIC | Status: DC | PRN
  Administered 2018-10-26 (×3): 1 [drp] via OPHTHALMIC

## 2018-10-26 MED ORDER — ONDANSETRON HCL 4 MG/2ML IJ SOLN
INTRAMUSCULAR | Status: DC | PRN
  Administered 2018-10-26: 4 mg via INTRAVENOUS

## 2018-10-26 MED ORDER — LIDOCAINE HCL (PF) 2 % IJ SOLN
INTRAOCULAR | Status: DC | PRN
  Administered 2018-10-26: 2 mL

## 2018-10-26 MED ORDER — NA HYALUR & NA CHOND-NA HYALUR 0.4-0.35 ML IO KIT
PACK | INTRAOCULAR | Status: DC | PRN
  Administered 2018-10-26: 1 mL via INTRAOCULAR

## 2018-10-26 MED ORDER — FENTANYL CITRATE (PF) 100 MCG/2ML IJ SOLN
INTRAMUSCULAR | Status: DC | PRN
  Administered 2018-10-26 (×2): 50 ug via INTRAVENOUS

## 2018-10-26 MED ORDER — CEFUROXIME OPHTHALMIC INJECTION 1 MG/0.1 ML
INJECTION | OPHTHALMIC | Status: DC | PRN
  Administered 2018-10-26: 0.1 mL via INTRACAMERAL

## 2018-10-26 MED ORDER — BRIMONIDINE TARTRATE-TIMOLOL 0.2-0.5 % OP SOLN
OPHTHALMIC | Status: DC | PRN
  Administered 2018-10-26: 1 [drp] via OPHTHALMIC

## 2018-10-26 SURGICAL SUPPLY — 19 items
CANNULA ANT/CHMB 27GA (MISCELLANEOUS) ×2 IMPLANT
GLOVE SURG LX 7.5 STRW (GLOVE) ×1
GLOVE SURG LX STRL 7.5 STRW (GLOVE) ×1 IMPLANT
GLOVE SURG TRIUMPH 8.0 PF LTX (GLOVE) ×2 IMPLANT
GOWN STRL REUS W/ TWL LRG LVL3 (GOWN DISPOSABLE) ×2 IMPLANT
GOWN STRL REUS W/TWL LRG LVL3 (GOWN DISPOSABLE) ×2
LENS IOL ACRSF IQ ULTRA 19.0 (Intraocular Lens) ×1 IMPLANT
LENS IOL ACRYSOF IQ 19.0 (Intraocular Lens) ×2 IMPLANT
MARKER SKIN DUAL TIP RULER LAB (MISCELLANEOUS) ×2 IMPLANT
NEEDLE FILTER BLUNT 18X 1/2SAF (NEEDLE) ×1
NEEDLE FILTER BLUNT 18X1 1/2 (NEEDLE) ×1 IMPLANT
PACK CATARACT BRASINGTON (MISCELLANEOUS) ×2 IMPLANT
PACK EYE AFTER SURG (MISCELLANEOUS) ×2 IMPLANT
PACK OPTHALMIC (MISCELLANEOUS) ×2 IMPLANT
SYR 3ML LL SCALE MARK (SYRINGE) ×2 IMPLANT
SYR 5ML LL (SYRINGE) ×2 IMPLANT
SYR TB 1ML LUER SLIP (SYRINGE) ×2 IMPLANT
WATER STERILE IRR 500ML POUR (IV SOLUTION) ×2 IMPLANT
WIPE NON LINTING 3.25X3.25 (MISCELLANEOUS) ×2 IMPLANT

## 2018-10-26 NOTE — Transfer of Care (Signed)
Immediate Anesthesia Transfer of Care Note  Patient: [name]  Procedure(s) Performed: CATARACT EXTRACTION PHACO AND INTRAOCULAR LENS PLACEMENT (IOC) RIGHT TORIC LENS (Right Eye)  Patient Location: PACU  Anesthesia Type: MAC  Level of Consciousness: awake, alert  and patient cooperative  Airway and Oxygen Therapy: Patient Spontanous Breathing and Patient connected to supplemental oxygen  Post-op Assessment: Post-op Vital signs reviewed, Patient's Cardiovascular Status Stable, Respiratory Function Stable, Patent Airway and No signs of Nausea or vomiting  Post-op Vital Signs: Reviewed and stable  Complications: No apparent anesthesia complications

## 2018-10-26 NOTE — H&P (Signed)

## 2018-10-26 NOTE — Anesthesia Procedure Notes (Signed)
Procedure Name: MAC Performed by: Indianna Boran, CRNA Pre-anesthesia Checklist: Patient identified, Emergency Drugs available, Suction available, Timeout performed and Patient being monitored Patient Re-evaluated:Patient Re-evaluated prior to induction Oxygen Delivery Method: Nasal cannula Placement Confirmation: positive ETCO2       

## 2018-10-26 NOTE — Op Note (Signed)
LOCATION:  [place]   PREOPERATIVE DIAGNOSIS:  Nuclear sclerotic cataract of the right eye.  H25.11   POSTOPERATIVE DIAGNOSIS:  Nuclear sclerotic cataract of the right eye.   PROCEDURE:  Phacoemulsification with Toric posterior chamber intraocular lens placement of the right eye.   LENS:   Implant Name Type Inv. Item Serial No. Manufacturer Lot No. LRB No. Used Action  LENS IOL ACRYSOF IQ 19.0 - [id number] Intraocular Lens LENS IOL ACRYSOF IQ 19.0 [id number] ALCON  Right 1 Implanted     [id number] 19.0D Toric intraocular lens with 3.0 diopters of cylindrical power with axis orientation at 72 degrees.   ULTRASOUND TIME: 11 % of 1 minutes, 11 seconds.  CDE 8.4   SURGEON:  Wyonia Hough, MD   ANESTHESIA: Topical with tetracaine drops and 2% Xylocaine jelly, augmented with 1% preservative-free intracameral lidocaine. .   COMPLICATIONS:  None.   DESCRIPTION OF PROCEDURE:  The patient was identified in the holding room and transported to the operating suite and placed in the supine position under the operating microscope.  The right eye was identified as the operative eye, and it was prepped and draped in the usual sterile ophthalmic fashion.    A clear-corneal paracentesis incision was made at the 12:00 position.  0.5 ml of preservative-free 1% lidocaine was injected into the anterior chamber. The anterior chamber was filled with Viscoat.  A 2.4 millimeter near clear corneal incision was then made at the 9:00 position.  A cystotome and capsulorrhexis forceps were then used to make a curvilinear capsulorrhexis.  Hydrodissection and hydrodelineation were then performed using balanced salt solution.   Phacoemulsification was then used in stop and chop fashion to remove the lens, nucleus and epinucleus.  The remaining cortex was aspirated using the irrigation and aspiration handpiece.  Provisc viscoelastic was then placed into the capsular bag to distend it for lens placement.   The Verion digital marker was used to align the implant at the intended axis.   A Toric lens was then injected into the capsular bag.  It was rotated clockwise until the axis marks on the lens were approximately 15 degrees in the counterclockwise direction to the intended alignment.  The viscoelastic was aspirated from the eye using the irrigation aspiration handpiece.  Then, a Koch spatula through the sideport incision was used to rotate the lens in a clockwise direction until the axis markings of the intraocular lens were lined up with the Verion alignment.  Balanced salt solution was then used to hydrate the wounds. Cefuroxime 0.1 ml of a 10mg /ml solution was injected into the anterior chamber for a dose of 1 mg of intracameral antibiotic at the completion of the case.    The eye was noted to have a physiologic pressure and there was no wound leak noted.   Timolol and Brimonidine drops were applied to the eye.  The patient was taken to the recovery room in stable condition having had no complications of anesthesia or surgery.  Orissa Arreaga 10/26/2018, 10:49 AM

## 2018-10-26 NOTE — Anesthesia Preprocedure Evaluation (Addendum)
Anesthesia Evaluation  Patient identified by MRN, date of birth, ID band  Reviewed: Allergy & Precautions, NPO status , Patient's Chart, lab work & pertinent test results  History of Anesthesia Complications (+) PONV and DIFFICULT AIRWAY  Airway Mallampati: II  TM Distance: >3 FB     Dental   Pulmonary sleep apnea and Continuous Positive Airway Pressure Ventilation ,    breath sounds clear to auscultation       Cardiovascular negative cardio ROS   Rhythm:Regular Rate:Normal     Neuro/Psych PSYCHIATRIC DISORDERS Depression    GI/Hepatic GERD  ,Hx diverticulitis Hx colon cancer   Endo/Other  diabetesHypothyroidism   Renal/GU      Musculoskeletal  (+) Fibromyalgia -  Abdominal   Peds  Hematology  (+) anemia ,   Anesthesia Other Findings   Reproductive/Obstetrics                          Anesthesia Physical Anesthesia Plan  ASA: III  Anesthesia Plan: MAC   Post-op Pain Management:    Induction: Intravenous  PONV Risk Score and Plan:   Airway Management Planned: Natural Airway and Nasal Cannula  Additional Equipment:   Intra-op Plan:   Post-operative Plan:   Informed Consent: I have reviewed the patients History and Physical, chart, labs and discussed the procedure including the risks, benefits and alternatives for the proposed anesthesia with the patient or authorized representative who has indicated his/her understanding and acceptance.       Plan Discussed with: CRNA  Anesthesia Plan Comments:         Anesthesia Quick Evaluation

## 2018-10-26 NOTE — Anesthesia Postprocedure Evaluation (Signed)
Anesthesia Post Note  Patient: [name]  Procedure(s) Performed: CATARACT EXTRACTION PHACO AND INTRAOCULAR LENS PLACEMENT (IOC) RIGHT TORIC LENS (Right Eye)  Patient location during evaluation: PACU Anesthesia Type: MAC Level of consciousness: awake and alert Pain management: pain level controlled Vital Signs Assessment: post-procedure vital signs reviewed and stable Respiratory status: spontaneous breathing, nonlabored ventilation, respiratory function stable and patient connected to nasal cannula oxygen Cardiovascular status: stable and blood pressure returned to baseline Postop Assessment: no apparent nausea or vomiting Anesthetic complications: no    Veda Canning

## 2018-10-27 ENCOUNTER — Encounter: Payer: Self-pay | Admitting: Ophthalmology

## 2018-11-03 ENCOUNTER — Telehealth (HOSPITAL_COMMUNITY): Payer: Self-pay

## 2018-11-03 DIAGNOSIS — F900 Attention-deficit hyperactivity disorder, predominantly inattentive type: Secondary | ICD-10-CM

## 2018-11-03 NOTE — Telephone Encounter (Signed)
Patient is calling for a refill on her Ritalin, she said it is the same phamracy.

## 2018-11-07 MED ORDER — METHYLPHENIDATE HCL 5 MG PO TABS: 5.0000 mg | ORAL_TABLET | ORAL | 0 refills | Status: DC

## 2018-11-07 NOTE — Telephone Encounter (Signed)
done

## 2018-11-08 ENCOUNTER — Other Ambulatory Visit (HOSPITAL_COMMUNITY): Payer: Self-pay | Admitting: Psychiatry

## 2018-11-08 DIAGNOSIS — F331 Major depressive disorder, recurrent, moderate: Secondary | ICD-10-CM

## 2018-11-09 ENCOUNTER — Encounter (HOSPITAL_COMMUNITY): Payer: Self-pay | Admitting: Psychiatry

## 2018-11-09 ENCOUNTER — Other Ambulatory Visit: Payer: Self-pay

## 2018-11-09 ENCOUNTER — Ambulatory Visit (INDEPENDENT_AMBULATORY_CARE_PROVIDER_SITE_OTHER): Payer: Medicare Other | Admitting: Psychiatry

## 2018-11-09 DIAGNOSIS — F331 Major depressive disorder, recurrent, moderate: Secondary | ICD-10-CM

## 2018-11-09 MED ORDER — TRINTELLIX 20 MG PO TABS: 20.0000 mg | ORAL_TABLET | ORAL | 2 refills | Status: DC

## 2018-11-09 MED ORDER — LAMOTRIGINE 200 MG PO TABS: 200.0000 mg | ORAL_TABLET | ORAL | 0 refills | Status: DC

## 2018-11-09 MED ORDER — TRAZODONE HCL 50 MG PO TABS: 50.0000 mg | ORAL_TABLET | Freq: Every day | ORAL | 0 refills | Status: DC

## 2018-11-09 NOTE — Progress Notes (Signed)
Virtual Visit via Telephone Note  I connected with [name] on 11/09/18 at  1:20 PM EDT by telephone and verified that I am speaking with the correct person using two identifiers.   I discussed the limitations, risks, security and privacy concerns of performing an evaluation and management service by telephone and the availability of in person appointments. I also discussed with the patient that there may be a patient responsible charge related to this service. The patient expressed understanding and agreed to proceed.   History of Present Illness: Patient was evaluated through phone session.  Recently she had a cataract surgery and she is doing better.  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  She is sleeping better since taking the trazodone every night.  Her attention, focus, multitasking is good with Ritalin.  She feel less depressed and less anxious.  She has no tremors, shakes or any EPS.  Her appetite is okay.  She denies any crying spells or any feeling of hopelessness or worthlessness.  Recently she had blood work.  Her TSH is normal.  Her basic chemistry is also normal.  Patient denies drinking or using any illegal substances.  She denies any isolation or any depression.  She lives with her husband who is supportive.  Her husband may require knee surgery in the future.  She like to continue her current medication.  Past Psychiatric History:Reviewed. History of mania, irritability, impulsive behavior and overdose at age 12. Admitted twice in the past. Diagnosed with ADD and had a psychological testing. Took Concerta, (Adderall, Vyvansemake heranxious)Wellbutrin, Prozac, lithium, Effexor, Lexapro, Abilify and Cymbalta. She recall best treatment when she was given a female hormone to help endometriosis. Had a history of ECT which failed. Had a [place] which helped her. No history of psychosis or any hallucination.  Recent Results (from the  past 2160 hour(s))  TSH     Status: None   Collection Time: 10/10/18  1:44 PM  Result Value Ref Range   TSH 3.79 0.35 - 4.50 uIU/mL  Basic Metabolic Panel (BMET)     Status: Abnormal   Collection Time: 10/10/18  1:44 PM  Result Value Ref Range   Sodium 139 135 - 145 mEq/L   Potassium 3.9 3.5 - 5.1 mEq/L   Chloride 103 96 - 112 mEq/L   CO2 29 19 - 32 mEq/L   Glucose, Bld 83 70 - 99 mg/dL   BUN 13 6 - 23 mg/dL   Creatinine, Ser 1.07 0.40 - 1.20 mg/dL   Calcium 9.3 8.4 - 10.5 mg/dL   GFR 50.50 (L) >60.00 mL/min  Novel Coronavirus, NAA (hospital order; send-out to ref lab)     Status: None   Collection Time: 10/21/18  9:39 AM   Specimen: Nasopharyngeal Swab; Respiratory  Result Value Ref Range   SARS-CoV-2, NAA NOT DETECTED NOT DETECTED    Comment: (NOTE) This test was developed and its performance characteristics determined by [employer]. This test has not been FDA cleared or approved. This test has been authorized by FDA under an Emergency Use Authorization (EUA). This test is only authorized for the duration of time the declaration that circumstances exist justifying the authorization of the emergency use of in vitro diagnostic tests for detection of SARS-CoV-2 virus and/or diagnosis of COVID-19 infection under section 564(b)(1) of the Act, 21 U.S.C. 401UUV-2(Z)(3), unless the authorization is terminated or revoked sooner. When diagnostic testing is negative, the possibility of a false negative result  should be considered in the context of a patient's recent exposures and the presence of clinical signs and symptoms consistent with COVID-19. An individual without symptoms of COVID-19 and who is not shedding SARS-CoV-2 virus would expect to have a negative (not detected) result in this assay. Performed  At: [facility] [place], [place] [id number] Rush Farmer MD AX:[id number]    Coronavirus Source NASOPHARYNGEAL     Comment: Performed at  [facility], [address], [place], [place] [address]    Psychiatric Specialty Exam: Physical Exam  ROS  There were no vitals taken for this visit.There is no height or weight on file to calculate BMI.  General Appearance: NA  Eye Contact:  NA  Speech:  Clear and Coherent and Normal Rate  Volume:  Normal  Mood:  Euthymic  Affect:  NA  Thought Process:  Goal Directed  Orientation:  Full (Time, Place, and Person)  Thought Content:  Logical  Suicidal Thoughts:  No  Homicidal Thoughts:  No  Memory:  Immediate;   Good Recent;   Good Remote;   Good  Judgement:  Good  Insight:  Good  Psychomotor Activity:  NA  Concentration:  Concentration: Fair and Attention Span: Fair  Recall:  Good  Fund of Knowledge:  Good  Language:  Good  Akathisia:  NA  Handed:  Right  AIMS (if indicated):     Assets:  Communication Skills Desire for Improvement Housing Resilience Social Support Transportation  ADL's:  Intact  Cognition:  WNL  Sleep:   good      Assessment and Plan: Major depressive disorder, recurrent.  Attention deficit disorder, inattentive type.  I reviewed her blood work results.  Patient is doing better on her current medication.  She has no side effects including any tremors, shakes, rash or any itching.  Continue Ritalin 5 mg in the morning, Lamictal 200 mg daily, Trintellix 20 mg in the morning and trazodone 50 mg at bedtime.  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  She realized that she need to take precautions due to COVID-19.  I recommended to call us back if she has any question or any concern.  Follow-up in 3 months.  Follow Up Instructions:    I discussed the assessment and treatment plan with the patient. The patient was provided an opportunity to ask questions and all were answered. The patient agreed with the plan and demonstrated an understanding of the instructions.   The patient was advised to call back or  seek an in-person evaluation if the symptoms worsen or if the condition fails to improve as anticipated.  I provided 20 minutes of non-face-to-face time during this encounter.   Kathlee Nations, MD

## 2018-11-15 ENCOUNTER — Other Ambulatory Visit (HOSPITAL_COMMUNITY): Payer: Self-pay | Admitting: Psychiatry

## 2018-11-15 DIAGNOSIS — F331 Major depressive disorder, recurrent, moderate: Secondary | ICD-10-CM

## 2018-11-17 ENCOUNTER — Ambulatory Visit: Payer: Medicare Other

## 2018-11-21 ENCOUNTER — Ambulatory Visit: Payer: Self-pay | Admitting: Oncology

## 2018-11-21 ENCOUNTER — Other Ambulatory Visit: Payer: Self-pay

## 2018-11-22 IMAGING — CT CT ABD-PELV W/ CM
2 of 5 series · 16 of 46 positions shown, 18 images · IV contrast (iopamidol)
Comparison: 01/29/2018

CLINICAL DATA: She states she has diverticulitis diagnosed 2 weeks
ago she stated she developed an abscess no drainage was performed
but was admitted to the ED. She states she is here for a follow up
exam. She has no current pains.

EXAM:
CT ABDOMEN AND PELVIS WITH CONTRAST
TECHNIQUE: Multidetector CT imaging of the abdomen and pelvis was performed
using the standard protocol following bolus administration of
intravenous contrast.
CONTRAST:  100mL [id number] IOPAMIDOL ([id number]) INJECTION 61%

[Series 2: abd pelvis · axial · 0.75mm/px · z∈[-1500,-1125]mm · 13 of 86 slices shown, 15 images (1 of 2)]
[im 6/86  soft-tissue]
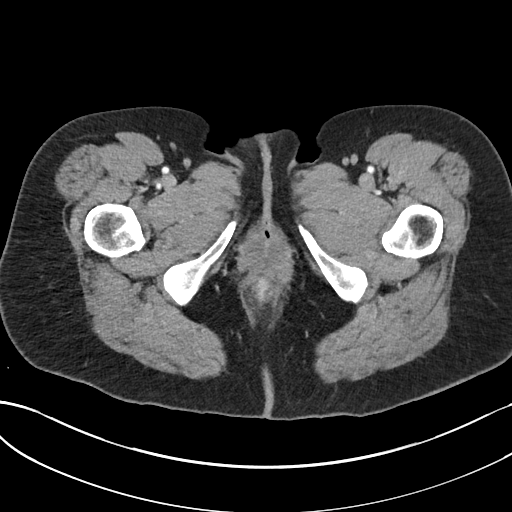
[im 6/86  bone]
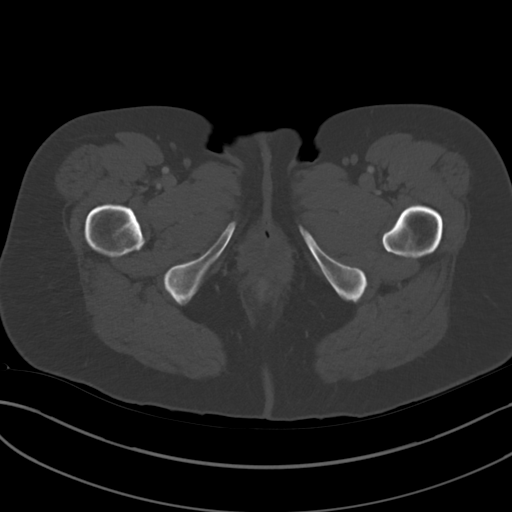
[im 11/86  soft-tissue]
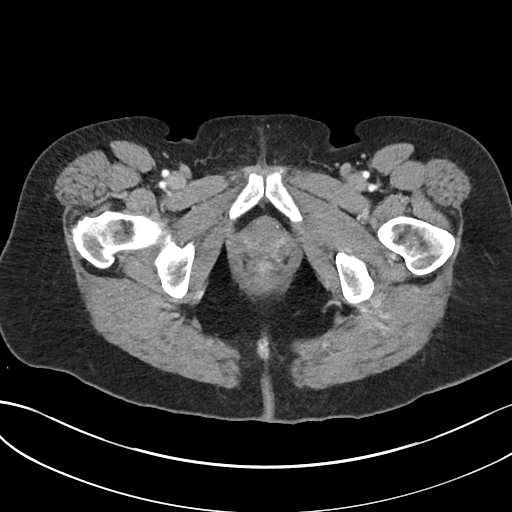
[im 21/86  soft-tissue]
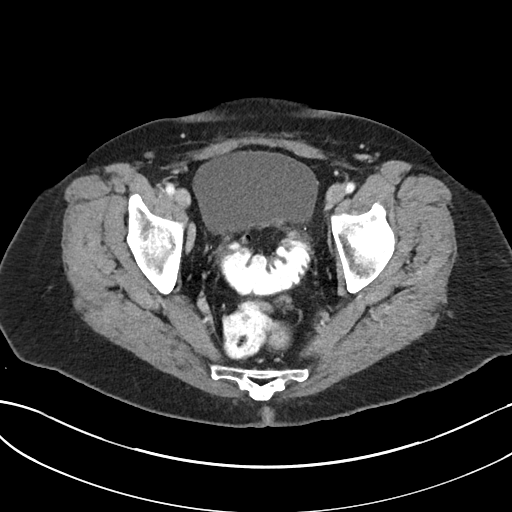
[im 26/86  soft-tissue]
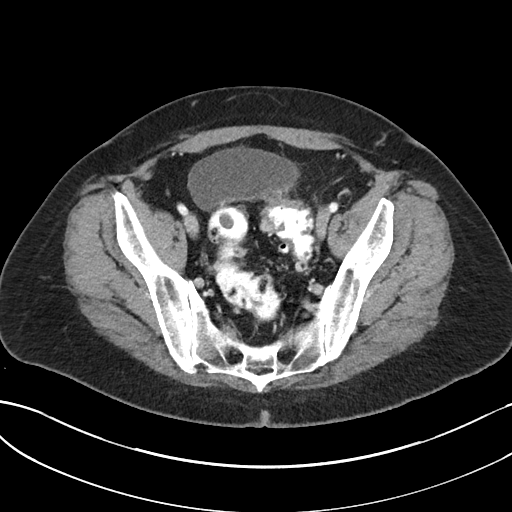
[im 31/86  soft-tissue]
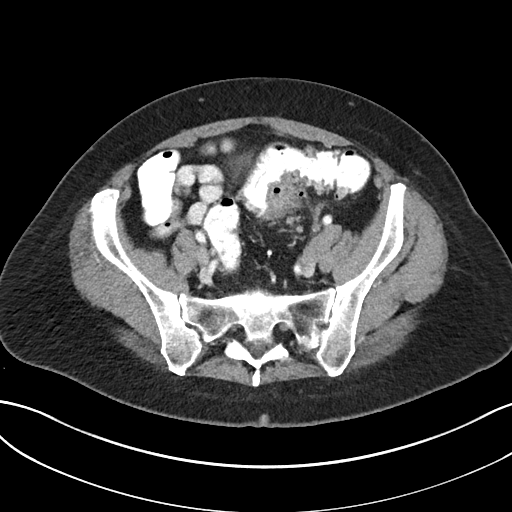
[im 36/86  soft-tissue]
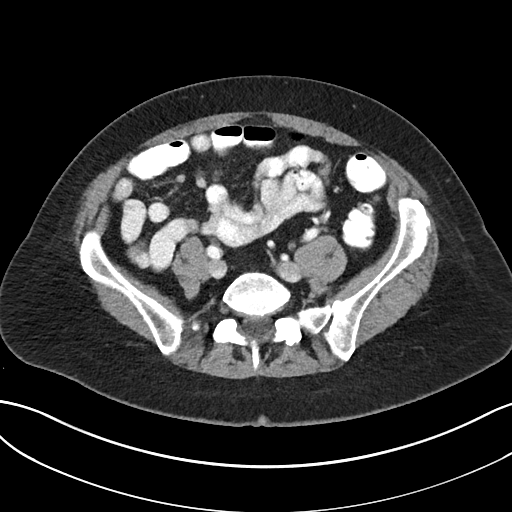
[im 46/86  soft-tissue]
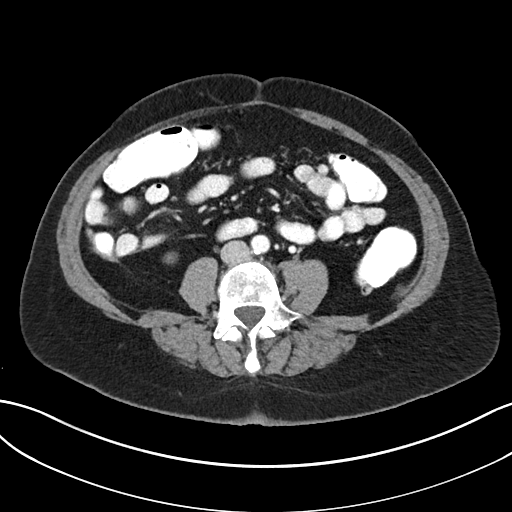
[im 51/86  soft-tissue]
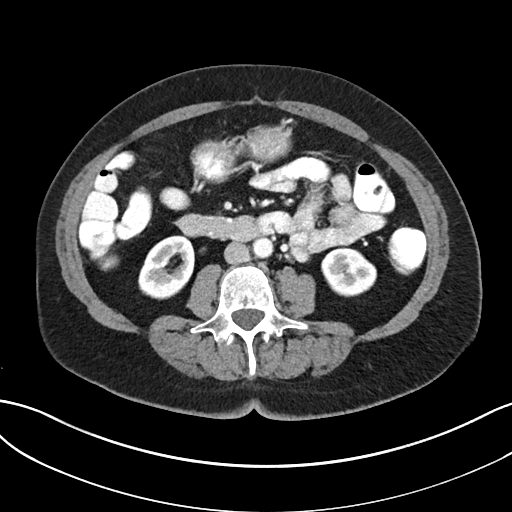
[im 56/86  soft-tissue]
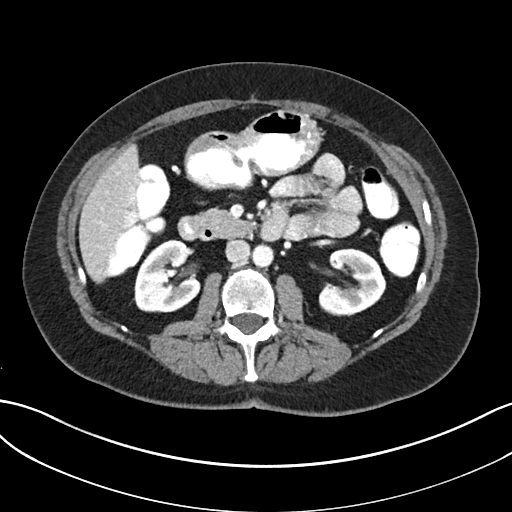
[im 56/86  bone]
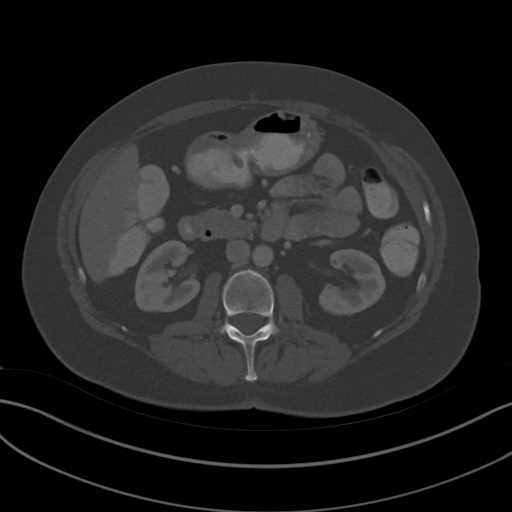
[im 61/86  soft-tissue]
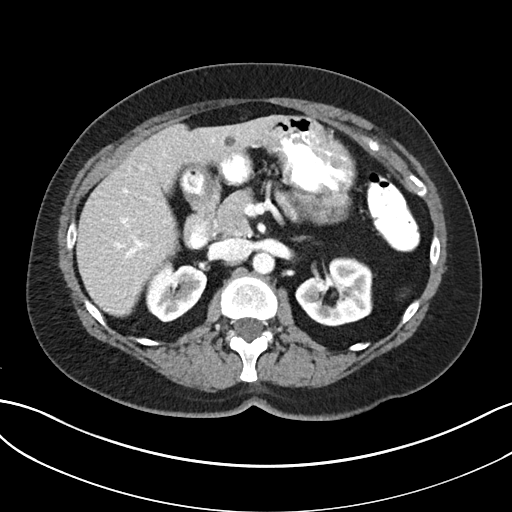
[im 66/86  soft-tissue]
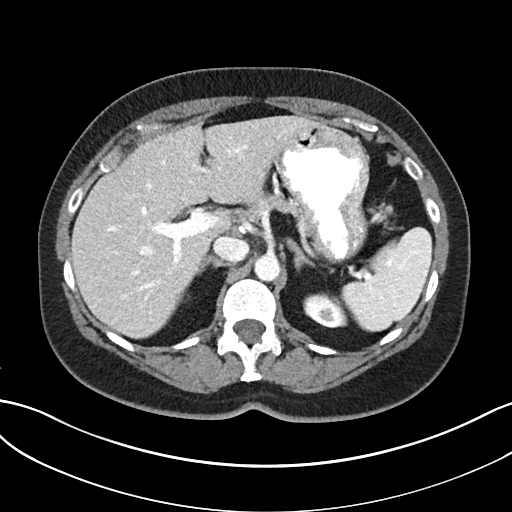
[im 76/86  soft-tissue]
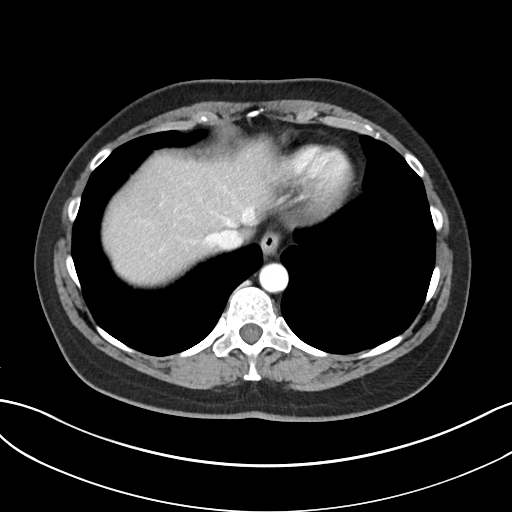
[im 81/86  soft-tissue]
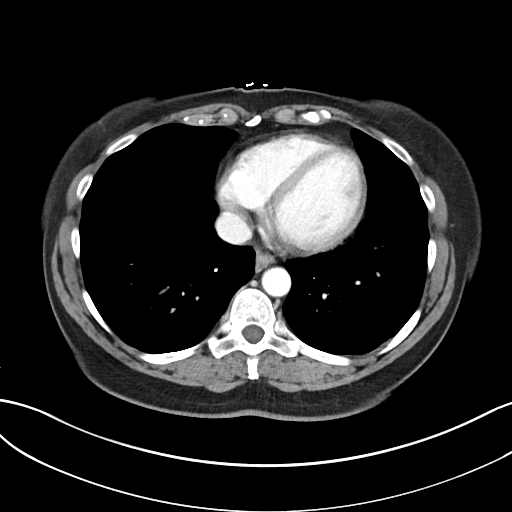

[Series 4: abd pelvis · coronal · 0.75mm/px · 3 of 186 slices shown (2 of 2)]
[im 62/186  soft-tissue]
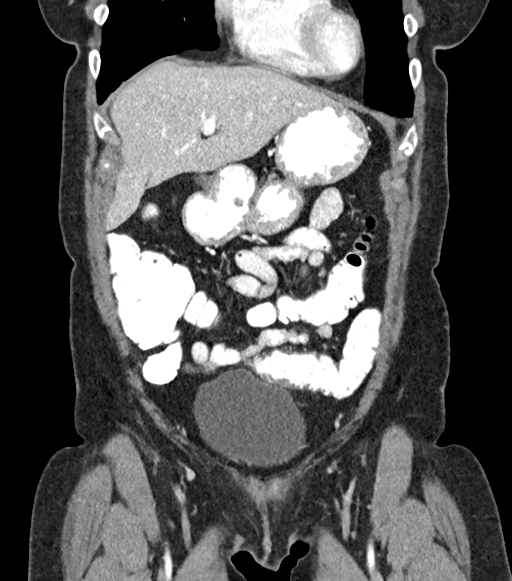
[im 83/186  soft-tissue]
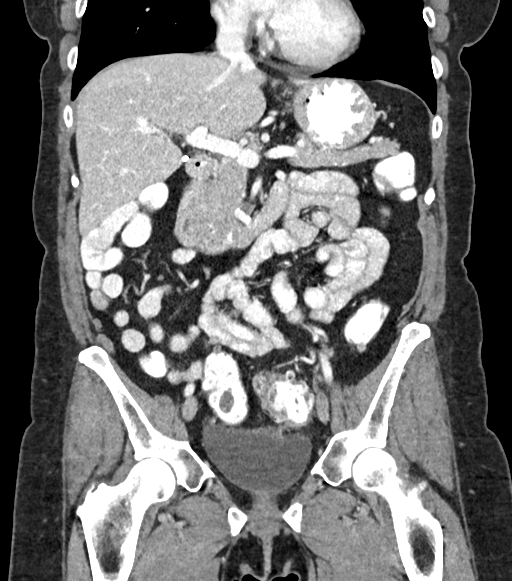
[im 103/186  soft-tissue]
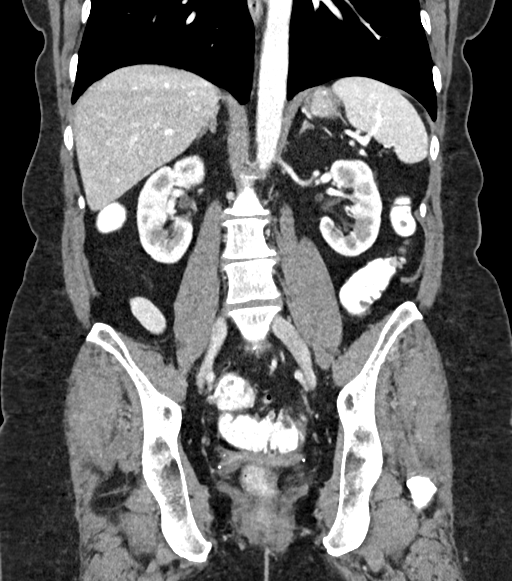

[16 of 46 positions shown; findings below may reference images not displayed]

FINDINGS: Lower chest: No acute abnormality.

Hepatobiliary: 10 mm hypodense, fluid attenuating left hepatic mass
most consistent with a small cyst.. Status post cholecystectomy. No
biliary dilatation.

Pancreas: Unremarkable. No pancreatic ductal dilatation or
surrounding inflammatory changes.

Spleen: Normal in size without focal abnormality.

Adrenals/Urinary Tract: Adrenal glands are unremarkable. Kidneys are
normal, without renal calculi, focal lesion, or hydronephrosis.
Bladder is unremarkable.

Stomach/Bowel: Stomach is within normal limits. Appendix appears
normal. Diverticulosis of the sigmoid colon with mild surrounding
inflammatory changes consistent with improving diverticulitis. 1.9 x
1.9 cm focal area soft tissue density along the anti mesenteric
aspect of the sigmoid colon with a few bubbles of air within the
area of abnormality most consistent with a small phlegmon without a
definitive drainable component. Small bubbles of air likely reflect
contained perforation.

Vascular/Lymphatic: No significant vascular findings are present. No
enlarged abdominal or pelvic lymph nodes.

Reproductive: Status post hysterectomy. No adnexal masses.

Other: No abdominal wall hernia or abnormality. No abdominopelvic
ascites.

Musculoskeletal: No acute osseous abnormality. No aggressive osseous
lesion.
IMPRESSION: 1. Mild improving sigmoid diverticulitis. 1.9 x 1.9 cm focal area
soft tissue density along the anti mesenteric aspect of the sigmoid
colon with a few bubbles of air within the area of abnormality most
consistent with a small phlegmon without a drainable component.

## 2018-11-29 DIAGNOSIS — D2262 Melanocytic nevi of left upper limb, including shoulder: Secondary | ICD-10-CM | POA: Diagnosis not present

## 2018-11-29 DIAGNOSIS — D2261 Melanocytic nevi of right upper limb, including shoulder: Secondary | ICD-10-CM | POA: Diagnosis not present

## 2018-11-29 DIAGNOSIS — Z85828 Personal history of other malignant neoplasm of skin: Secondary | ICD-10-CM | POA: Diagnosis not present

## 2018-11-29 DIAGNOSIS — D1721 Benign lipomatous neoplasm of skin and subcutaneous tissue of right arm: Secondary | ICD-10-CM | POA: Diagnosis not present

## 2018-11-29 DIAGNOSIS — Z08 Encounter for follow-up examination after completed treatment for malignant neoplasm: Secondary | ICD-10-CM | POA: Diagnosis not present

## 2018-11-29 DIAGNOSIS — L821 Other seborrheic keratosis: Secondary | ICD-10-CM | POA: Diagnosis not present

## 2018-11-29 DIAGNOSIS — D225 Melanocytic nevi of trunk: Secondary | ICD-10-CM | POA: Diagnosis not present

## 2018-11-29 DIAGNOSIS — D2272 Melanocytic nevi of left lower limb, including hip: Secondary | ICD-10-CM | POA: Diagnosis not present

## 2018-11-29 DIAGNOSIS — D2271 Melanocytic nevi of right lower limb, including hip: Secondary | ICD-10-CM | POA: Diagnosis not present

## 2018-11-29 DIAGNOSIS — D485 Neoplasm of uncertain behavior of skin: Secondary | ICD-10-CM | POA: Diagnosis not present

## 2018-12-06 ENCOUNTER — Telehealth (HOSPITAL_COMMUNITY): Payer: Self-pay

## 2018-12-06 DIAGNOSIS — F900 Attention-deficit hyperactivity disorder, predominantly inattentive type: Secondary | ICD-10-CM

## 2018-12-06 MED ORDER — METHYLPHENIDATE HCL 5 MG PO TABS: 5.0000 mg | ORAL_TABLET | ORAL | 0 refills | Status: DC

## 2018-12-06 NOTE — Telephone Encounter (Signed)
done

## 2018-12-06 NOTE — Telephone Encounter (Signed)
Patient is calling for a refill on Ritalin

## 2018-12-20 ENCOUNTER — Ambulatory Visit
Admission: RE | Admit: 2018-12-20 | Discharge: 2018-12-20 | Disposition: A | Discharge status: Home / Self Care | Payer: Medicare Other | Source: Ambulatory Visit | Attending: Oncology | Admitting: Oncology

## 2018-12-20 ENCOUNTER — Other Ambulatory Visit: Payer: Self-pay

## 2018-12-20 DIAGNOSIS — C182 Malignant neoplasm of ascending colon: Secondary | ICD-10-CM

## 2018-12-20 DIAGNOSIS — K769 Liver disease, unspecified: Secondary | ICD-10-CM | POA: Diagnosis not present

## 2018-12-20 DIAGNOSIS — C189 Malignant neoplasm of colon, unspecified: Secondary | ICD-10-CM | POA: Diagnosis not present

## 2018-12-22 ENCOUNTER — Other Ambulatory Visit: Payer: Self-pay | Admitting: Internal Medicine

## 2018-12-22 DIAGNOSIS — R112 Nausea with vomiting, unspecified: Secondary | ICD-10-CM

## 2018-12-22 MED ORDER — ONDANSETRON 4 MG PO TBDP: 4.0000 mg | ORAL_TABLET | Freq: Two times a day (BID) | ORAL | 1 refills | Status: DC | PRN

## 2018-12-26 ENCOUNTER — Other Ambulatory Visit: Payer: Self-pay

## 2018-12-26 DIAGNOSIS — D485 Neoplasm of uncertain behavior of skin: Secondary | ICD-10-CM | POA: Diagnosis not present

## 2018-12-27 ENCOUNTER — Other Ambulatory Visit: Payer: Self-pay

## 2018-12-27 ENCOUNTER — Inpatient Hospital Stay: Payer: Medicare Other | Attending: Oncology

## 2018-12-27 ENCOUNTER — Encounter: Payer: Self-pay | Admitting: Oncology

## 2018-12-27 ENCOUNTER — Inpatient Hospital Stay (HOSPITAL_BASED_OUTPATIENT_CLINIC_OR_DEPARTMENT_OTHER): Payer: Medicare Other | Admitting: Oncology

## 2018-12-27 VITALS — BP 117/74 | HR 63 | Temp 97.0°F | Resp 16 | Wt 158.1 lb

## 2018-12-27 DIAGNOSIS — K5730 Diverticulosis of large intestine without perforation or abscess without bleeding: Secondary | ICD-10-CM | POA: Insufficient documentation

## 2018-12-27 DIAGNOSIS — K648 Other hemorrhoids: Secondary | ICD-10-CM | POA: Insufficient documentation

## 2018-12-27 DIAGNOSIS — N183 Chronic kidney disease, stage 3 unspecified: Secondary | ICD-10-CM

## 2018-12-27 DIAGNOSIS — C182 Malignant neoplasm of ascending colon: Secondary | ICD-10-CM | POA: Diagnosis not present

## 2018-12-27 DIAGNOSIS — C180 Malignant neoplasm of cecum: Secondary | ICD-10-CM | POA: Diagnosis not present

## 2018-12-27 DIAGNOSIS — D509 Iron deficiency anemia, unspecified: Secondary | ICD-10-CM | POA: Diagnosis not present

## 2018-12-27 LAB — CBC WITH DIFFERENTIAL/PLATELET
Abs Immature Granulocytes: 0.01 10*3/uL (ref 0.00–0.07)
Basophils Absolute: 0.1 10*3/uL (ref 0.0–0.1)
Basophils Relative: 1 %
Eosinophils Absolute: 0 10*3/uL (ref 0.0–0.5)
Eosinophils Relative: 0 %
HCT: 39.8 % (ref 36.0–46.0)
Hemoglobin: 13.1 g/dL (ref 12.0–15.0)
Immature Granulocytes: 0 %
Lymphocytes Relative: 46 %
Lymphs Abs: 3.8 10*3/uL (ref 0.7–4.0)
MCH: 30.9 pg (ref 26.0–34.0)
MCHC: 32.9 g/dL (ref 30.0–36.0)
MCV: 93.9 fL (ref 80.0–100.0)
Monocytes Absolute: 0.4 10*3/uL (ref 0.1–1.0)
Monocytes Relative: 5 %
Neutro Abs: 4 10*3/uL (ref 1.7–7.7)
Neutrophils Relative %: 48 %
Platelets: 235 10*3/uL (ref 150–400)
RBC: 4.24 MIL/uL (ref 3.87–5.11)
RDW: 13 % (ref 11.5–15.5)
WBC: 8.2 10*3/uL (ref 4.0–10.5)
nRBC: 0 % (ref 0.0–0.2)

## 2018-12-27 LAB — COMPREHENSIVE METABOLIC PANEL
ALT: 15 U/L (ref 0–44)
AST: 13 U/L — ABNORMAL LOW (ref 15–41)
Albumin: 3.8 g/dL (ref 3.5–5.0)
Alkaline Phosphatase: 59 U/L (ref 38–126)
Anion gap: 6 (ref 5–15)
BUN: 12 mg/dL (ref 8–23)
CO2: 29 mmol/L (ref 22–32)
Calcium: 9.5 mg/dL (ref 8.9–10.3)
Chloride: 106 mmol/L (ref 98–111)
Creatinine, Ser: 0.99 mg/dL (ref 0.44–1.00)
GFR calc Af Amer: >60 mL/min (ref >60)
GFR calc non Af Amer: 57 mL/min — ABNORMAL LOW (ref >60)
Glucose, Bld: 84 mg/dL (ref 70–99)
Potassium: 4.5 mmol/L (ref 3.5–5.1)
Sodium: 141 mmol/L (ref 135–145)
Total Bilirubin: 0.6 mg/dL (ref 0.3–1.2)
Total Protein: 6.7 g/dL (ref 6.5–8.1)

## 2018-12-27 NOTE — Progress Notes (Signed)
Hematology/Oncology Follow Up Note [facility] Telephone:([phone] [phone] Patient Care Team: McLean-Scocuzza, Nino Glow, MD as PCP - General (Internal Medicine) Manya Silvas, MD (Gastroenterology) Bary Castilla Forest Gleason, MD (General Surgery) Clent Jacks, RN as Registered Nurse  REASON FOR VISIT Follow up for treatment of management of colon cancer and iron deficiency anemia.  HISTORY OF PRESENTING ILLNESS:  [name] is a @ 73 y.o.  female with PMH listed below who was referred to me for evaluation and management of colon cancer.  a colonoscopy done on 12/25/2016 by Dr. Vira Agar. Moves her bowels daily. Patient states she is nausea, states it started at the end of June .She states she was having pain in her left upper quadrant pain was seen in the ER on 12/24/2016 Upper and lower endoscopies dated 12/28/2016 reviewed which showed Cecal mass, biopsy-proven adenocarcinoma. Patient underwent laparoscopically assisted right hemicolectomy with ileotransverse colostomy. Pathology revealed pT3N0 adenocarcinoma, with perineural invasion and lymphvascular invasion  #  Cancer Stage: pT3N0cM0, stage colon cancer.  Declined adjuvant chemotherapy. #Repeat colonoscopy 01/17/2018 by Dr. Vira Agar showed diverticulosis in the sigmoid colon, in the descending colon and transverse colon.  Internal hemorrhoids.  No specimen collected.  Interval History Patient presents for follow-up of colon cancer and iron deficiency anemia.  Patient reports feeling well.  She has had surveillance CT done during interval. Reports doing well.  Denies any bowel movement issues.  No blood in the stool. Chronic nausea, no vomiting.  Stable.  Symptoms not better or worse.    Review of Systems  Constitutional: Negative for chills, fever, malaise/fatigue and weight loss.  HENT: Negative for congestion, ear discharge, ear pain, hearing loss, nosebleeds, sinus pain, sore throat  and tinnitus.   Eyes: Negative for blurred vision, double vision, photophobia, pain, discharge and redness.  Respiratory: Negative for cough, hemoptysis, sputum production, shortness of breath and wheezing.   Cardiovascular: Negative for chest pain, palpitations, orthopnea, claudication and leg swelling.  Gastrointestinal: Positive for nausea. Negative for abdominal pain, blood in stool, constipation, diarrhea, heartburn, melena and vomiting.  Genitourinary: Negative for dysuria, flank pain, frequency and hematuria.  Musculoskeletal: Negative for back pain, myalgias and neck pain.  Skin: Negative for itching and rash.  Neurological: Negative for dizziness, tingling, tremors, focal weakness, weakness and headaches.  Endo/Heme/Allergies: Negative for environmental allergies. Does not bruise/bleed easily.  Psychiatric/Behavioral: Negative for depression, hallucinations and substance abuse. The patient is not nervous/anxious.      MEDICAL HISTORY:  Past Medical History:  Diagnosis Date   Allergy    Anemia    Carcinoma ([place])    Colon cancer ([place]) 01/2017   Colon polyps    Diverticulitis    01/2018 abscess hosp. 01/2018   Endometriosis    Fibromyalgia    Gastric polyps    GERD (gastroesophageal reflux disease)    Hemorrhoids    Hypothyroidism    Iron deficiency anemia 03/08/2017   Major depressive disorder, recurrent episode, mild (HCC)    Dr. Adele Schilder in [place] h/o ECT x 6 x and hosp x 2 and transcranial magnetic treatment    Miscarriage    x2   PONV (postoperative nausea and vomiting)    Skin cancer    arm, thigh and chest Dr. Evorn Gong    Sleep apnea    USES CPAP   UTI (urinary tract infection)     SURGICAL HISTORY: Past Surgical History:  Procedure Laterality Date   ABDOMINAL HYSTERECTOMY  1990   endometriosis   BREAST  BIOPSY Left 2004   benign   CATARACT EXTRACTION W/PHACO Right 10/26/2018   Procedure: CATARACT EXTRACTION PHACO AND INTRAOCULAR LENS PLACEMENT  ([place]) RIGHT TORIC LENS;  Surgeon: Leandrew Koyanagi, MD;  Location: [facility];  Service: Ophthalmology;  Laterality: Right;  sleep apnea   CATARACT EXTRACTION, BILATERAL Bilateral 05/2012   CHOLECYSTECTOMY  2001   COLONOSCOPY WITH PROPOFOL N/A 12/28/2016   Procedure: COLONOSCOPY WITH PROPOFOL;  Surgeon: Manya Silvas, MD;  Location: [facility] ENDOSCOPY;  Service: Endoscopy;  Laterality: N/A;   COLONOSCOPY WITH PROPOFOL N/A 01/17/2018   Procedure: COLONOSCOPY WITH PROPOFOL;  Surgeon: Manya Silvas, MD;  Location: [facility] ENDOSCOPY;  Service: Endoscopy;  Laterality: N/A;   ESOPHAGOGASTRODUODENOSCOPY (EGD) WITH PROPOFOL N/A 12/28/2016   Procedure: ESOPHAGOGASTRODUODENOSCOPY (EGD) WITH PROPOFOL;  Surgeon: Manya Silvas, MD;  Location: [facility] ENDOSCOPY;  Service: Endoscopy;  Laterality: N/A;   EYE SURGERY     lens left eye, cataract right eye    LAPAROSCOPIC RIGHT COLECTOMY Right 01/21/2017   Procedure: LAPAROSCOPIC ASSISTED RIGHT COLECTOMY;  Surgeon: Robert Bellow, MD;  Location: [facility];  Service: General;  Laterality: Right;   TONSILLECTOMY      SOCIAL HISTORY: Social History   Socioeconomic History   Marital status: Married    Spouse name: Not on file   Number of children: Not on file   Years of education: Not on file   Highest education level: Not on file  Occupational History   Not on file  Social Needs   Financial resource strain: Not on file   Food insecurity    Worry: Not on file    Inability: Not on file   Transportation needs    Medical: Not on file    Non-medical: Not on file  Tobacco Use   Smoking status: Never Smoker   Smokeless tobacco: Never Used  Substance and Sexual Activity   Alcohol use: Not Currently    Alcohol/week: 0.0 standard drinks    Comment:     Drug use: No   Sexual activity: Yes    Partners: Male    Birth control/protection: None  Lifestyle   Physical activity    Days per week: Not on file    Minutes per  session: Not on file   Stress: Not on file  Relationships   Social connections    Talks on phone: Not on file    Gets together: Not on file    Attends religious service: Not on file    Active member of club or organization: Not on file    Attends meetings of clubs or organizations: Not on file    Relationship status: Not on file   Intimate partner violence    Fear of current or ex partner: Not on file    Emotionally abused: Not on file    Physically abused: Not on file    Forced sexual activity: Not on file  Other Topics Concern   Not on file  Social History Narrative   Light smoker age 21 stopped    2 sons    Married    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  MEDICATIONS:  Current Outpatient Medications  Medication Sig Dispense Refill   calcium-vitamin D (OSCAL WITH D) 500-200 MG-UNIT tablet Take 2 tablets by mouth 2 (two) times daily.     cetirizine (ZYRTEC) 10 MG tablet Take 10 mg by mouth daily as needed for allergies.      estradiol (ESTRACE) 0.5 MG tablet Take 1 tablet (0.5 mg total) by mouth daily. 90 tablet 3   lamoTRIgine (LAMICTAL) 200 MG tablet Take 1 tablet (200 mg total) by mouth every morning. 90 tablet 0   methylphenidate (RITALIN) 5 MG tablet Take 1 tablet (5 mg total) by mouth every morning. 30 tablet 0   Multiple Vitamins-Minerals (MULTIVITAMIN WITH MINERALS) tablet Take 1 tablet by mouth daily.     omeprazole (PRILOSEC) 20 MG capsule Take 1 capsule  (20 mg total) by mouth daily before breakfast. 90 capsule 3   ondansetron (ZOFRAN-ODT) 4 MG disintegrating tablet Take 1 tablet (4 mg total) by mouth 2 (two) times daily as needed for nausea or vomiting. AND PRN if this continues see gastroenterology 90 tablet 1   traZODone (DESYREL) 50 MG tablet Take 1 tablet (50 mg total) by mouth at bedtime. 90 tablet 0   TRINTELLIX 20 MG TABS tablet Take 1 tablet (20 mg total) by mouth every morning. 30 tablet 2   VITAMIN D PO Take by mouth daily.     montelukast (SINGULAIR) 10 MG tablet Take 1 tablet (10 mg total) by mouth at bedtime. (Patient not taking: Reported on 10/19/2018) 30 tablet 3   No current facility-administered medications for this visit.       Marland Kitchen  PHYSICAL EXAMINATION: ECOG PERFORMANCE STATUS: 0 - Asymptomatic Vitals:   12/27/18 1016  BP: 117/74  Pulse: 63  Resp: 16  Temp: (!) 97 F (36.1 C)   Filed Weights   12/27/18 1016  Weight: 158 lb 1.6 oz (71.7 kg)   Physical Exam  Constitutional: She is oriented to person, place, and time. No distress.  HENT:  Head: Normocephalic and atraumatic.  Nose: Nose normal.  Mouth/Throat: Oropharynx is clear and moist. No oropharyngeal exudate.  Eyes: Pupils are equal, round, and reactive to light. EOM are normal. No scleral icterus.  Neck: Normal range of motion. Neck supple.  Cardiovascular: Normal rate and regular rhythm.  No murmur heard. Pulmonary/Chest: Effort normal. No respiratory distress. She has no rales. She exhibits no tenderness.  Abdominal: Soft. She exhibits no distension. There is no abdominal tenderness.  Musculoskeletal: Normal range of motion.        General: No edema.  Neurological: She is alert and oriented to person, place, and time. No cranial nerve deficit. She exhibits normal muscle tone. Coordination normal.  Skin: Skin is warm and dry. She is not diaphoretic. No erythema.  Psychiatric: Affect normal.      LABORATORY DATA:  I have reviewed the data as  listed Lab Results  Component Value Date   WBC 8.2 12/27/2018   HGB 13.1 12/27/2018   HCT 39.8 12/27/2018   MCV 93.9 12/27/2018   PLT 235 12/27/2018   Recent Labs    04/14/18 1023 07/13/18 0959 10/10/18 1344 12/27/18 1007  NA 138 138 139 141  K 4.3 4.2 3.9  4.5  CL 104 103 103 106  CO2 28 29 29 29   GLUCOSE 97 107* 83 84  BUN 11 12 13 12   CREATININE 0.99 1.17* 1.07 0.99  CALCIUM 9.3 9.3 9.3 9.5  GFRNONAA 58* 47*  --  57*  GFRAA >60 54*  --  >60  PROT 6.9 6.9  --  6.7  ALBUMIN 4.1 4.3  --  3.8  AST 19 21  --  13*  ALT 21 18  --  15  ALKPHOS 62 72  --  59  BILITOT 0.6 0.6  --  0.6    RADIOGRAPHIC STUDIES: I have personally reviewed the radiological images as listed and agreed with the findings in the report. CT abdomen pelvis with contrast.  IMPRESSION: 1. Negative for a bowel obstruction. 2. Irregular wall thickening of the cecum and ileocecal region presumably corresponding to the patient's history of colon cancer 3. Sigmoid colon diverticular disease without acute inflammation 4. Subcentimeter hypodense lesion in the left hepatic lobe too small to further characterize  CT chest 02/26/2017 IMPRESSION: No evidence of metastatic disease in the chest. Subcentimeter hypodense lesion in the lateral segment left liver lobe, too small to characterize, for which 2 month stability has been demonstrated, probably benign.  Pathology Surgical Pathology  DIAGNOSIS:  A. RIGHT COLON; LAPAROSCOPIC-ASSISTED RIGHT COLECTOMY:  - INVASIVE ADENOCARCINOMA, MODERATELY DIFFERENTIATED, 4.6 CM.  - RESECTION MARGINS ARE NEGATIVE.  - POLYP WITH FEATURES OF A SESSILE SERRATED ADENOMA, 0.6 CM.  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  - NO TUMOR SEEN IN FOURTEEN LYMPH NODES (0/14).  - SEE SUMMARY BELOW.   COLON AND RECTUM:  Procedure: Right colectomy  Tumor Site: Ileocecal valve  Tumor Size: Greatest dimension: 4.6 cm  Macroscopic Tumor Perforation: Not  specified  Histologic Type: Adenocarcinoma  Histologic Grade: G2; Moderately differentiated  Tumor Extension: Tumor invades through muscularis propria into  pericolorectal tissue  Margins: All margins uninvolved by invasive carcinoma, high-grade  dysplasia, intramucosal adenocarcinoma, and adenoma  Treatment Effect: No known presurgical therapy  Lymphovascular Invasion: Present  Perineural Invasion: Present  Tumor Deposits: Not identified  Regional Lymph Nodes: # examined: 14,  # involved: 0  Pathologic Stage Classification (pTNM, AJCC 8th Edition): pT3 pN0  TNM Descriptors: Not applicable   RADIOGRAPHIC STUDIES: I have personally reviewed the radiological images as listed and agreed with the findings in the report. Ct Abdomen Pelvis Wo Contrast  Result Date: 12/20/2018 CLINICAL DATA:  Colon cancer restaging, prior right hemicolectomy. EXAM: CT CHEST, ABDOMEN AND PELVIS WITHOUT CONTRAST TECHNIQUE: Multidetector CT imaging of the chest, abdomen and pelvis was performed following the standard protocol without IV contrast. COMPARISON:  Multiple exams, including 05/20/2018 and 01/11/2017 FINDINGS: CT CHEST FINDINGS Cardiovascular: Mild atherosclerotic calcification of the aortic arch. Mediastinum/Nodes: Small amount of fluid in posterior pericardial recess as before. No pathologic adenopathy. Lungs/Pleura: Mild biapical pleuroparenchymal scarring. Stable calcified granuloma in the left upper lobe on image 48/3. Musculoskeletal: Unremarkable CT ABDOMEN PELVIS FINDINGS Hepatobiliary: Stable 0.9 by 0.7 cm hypodense lesion in segment 3 of the liver on image 58/2, likely a cyst. Cholecystectomy. Pancreas: Unremarkable Spleen: Unremarkable Adrenals/Urinary Tract: Unremarkable Stomach/Bowel: Multiple gastric polyps along the gastric fundus. These were previously biopsied back on 12/28/2016, revealing fundic gland polyps. Sigmoid colon diverticulosis without active diverticulitis. Right hemicolectomy.  Vascular/Lymphatic: Mild aortoiliac atherosclerotic vascular disease. No current pathologic adenopathy. Reproductive: Uterus absent.  Adnexa unremarkable. Other: No supplemental non-categorized findings. Musculoskeletal: Degenerative endplate disease eccentric to the left at L3-4. IMPRESSION: 1. No  findings of active malignancy. 2. Stable small hypodense lesion in segment 3 of the liver is likely a cyst. 3. Multiple gastric polyps, previously biopsied in 2018 revealing fundic gland polyps. 4. Sigmoid colon diverticulosis. 5.  Aortic Atherosclerosis (ICD10-I70.0). Electronically Signed   By: Van Clines M.D.   On: 12/20/2018 11:43   Ct Chest Wo Contrast  Result Date: 12/20/2018 CLINICAL DATA:  Colon cancer restaging, prior right hemicolectomy. EXAM: CT CHEST, ABDOMEN AND PELVIS WITHOUT CONTRAST TECHNIQUE: Multidetector CT imaging of the chest, abdomen and pelvis was performed following the standard protocol without IV contrast. COMPARISON:  Multiple exams, including 05/20/2018 and 01/11/2017 FINDINGS: CT CHEST FINDINGS Cardiovascular: Mild atherosclerotic calcification of the aortic arch. Mediastinum/Nodes: Small amount of fluid in posterior pericardial recess as before. No pathologic adenopathy. Lungs/Pleura: Mild biapical pleuroparenchymal scarring. Stable calcified granuloma in the left upper lobe on image 48/3. Musculoskeletal: Unremarkable CT ABDOMEN PELVIS FINDINGS Hepatobiliary: Stable 0.9 by 0.7 cm hypodense lesion in segment 3 of the liver on image 58/2, likely a cyst. Cholecystectomy. Pancreas: Unremarkable Spleen: Unremarkable Adrenals/Urinary Tract: Unremarkable Stomach/Bowel: Multiple gastric polyps along the gastric fundus. These were previously biopsied back on 12/28/2016, revealing fundic gland polyps. Sigmoid colon diverticulosis without active diverticulitis. Right hemicolectomy. Vascular/Lymphatic: Mild aortoiliac atherosclerotic vascular disease. No current pathologic adenopathy.  Reproductive: Uterus absent.  Adnexa unremarkable. Other: No supplemental non-categorized findings. Musculoskeletal: Degenerative endplate disease eccentric to the left at L3-4. IMPRESSION: 1. No findings of active malignancy. 2. Stable small hypodense lesion in segment 3 of the liver is likely a cyst. 3. Multiple gastric polyps, previously biopsied in 2018 revealing fundic gland polyps. 4. Sigmoid colon diverticulosis. 5.  Aortic Atherosclerosis (ICD10-I70.0). Electronically Signed   By: Van Clines M.D.   On: 12/20/2018 11:43   US Breast Ltd Uni Right Inc Axilla  Result Date: 10/11/2018 CLINICAL DATA:  Six-month follow-up of probable benign right breast mass. EXAM: DIGITAL DIAGNOSTIC right MAMMOGRAM WITH CAD AND TOMO ULTRASOUND right BREAST COMPARISON:  Previous exam(s). ACR Breast Density Category b: There are scattered areas of fibroglandular density. FINDINGS: Exam demonstrates no change in an oval subcentimeter circumscribed mass over the posterior third of the outer lower right breast. Remainder of the exam is unchanged. Mammographic images were processed with CAD. Targeted ultrasound is performed, showing no significant change in oval circumscribed hypo to anechoic mass with mild increased through transmission over the 7:30 position of the right breast 10 cm from the nipple measuring 3 x 4 x 5 mm. This likely represents a minimally complicated cyst. IMPRESSION: Stable probable benign 5 mm mass over the 7:30 position of the right breast. RECOMMENDATION: Recommend an additional six-month follow-up diagnostic right breast mammogram and ultrasound at the time patient's annual bilateral mammogram and August 2020 to document continued stability. I have discussed the findings and recommendations with the patient. Results were also provided in writing at the conclusion of the visit. If applicable, a reminder letter will be sent to the patient regarding the next appointment. BI-RADS CATEGORY  3: Probably  benign. Electronically Signed   By: Marin Olp M.D.   On: 10/11/2018 11:53   Mm Diag Breast Tomo Uni Right  Result Date: 10/11/2018 CLINICAL DATA:  Six-month follow-up of probable benign right breast mass. EXAM: DIGITAL DIAGNOSTIC right MAMMOGRAM WITH CAD AND TOMO ULTRASOUND right BREAST COMPARISON:  Previous exam(s). ACR Breast Density Category b: There are scattered areas of fibroglandular density. FINDINGS: Exam demonstrates no change in an oval subcentimeter circumscribed mass over the posterior third of the outer  lower right breast. Remainder of the exam is unchanged. Mammographic images were processed with CAD. Targeted ultrasound is performed, showing no significant change in oval circumscribed hypo to anechoic mass with mild increased through transmission over the 7:30 position of the right breast 10 cm from the nipple measuring 3 x 4 x 5 mm. This likely represents a minimally complicated cyst. IMPRESSION: Stable probable benign 5 mm mass over the 7:30 position of the right breast. RECOMMENDATION: Recommend an additional six-month follow-up diagnostic right breast mammogram and ultrasound at the time patient's annual bilateral mammogram and August 2020 to document continued stability. I have discussed the findings and recommendations with the patient. Results were also provided in writing at the conclusion of the visit. If applicable, a reminder letter will be sent to the patient regarding the next appointment. BI-RADS CATEGORY  3: Probably benign. Electronically Signed   By: Marin Olp M.D.   On: 10/11/2018 11:53    ASSESSMENT & PLAN:  1. Cancer of right colon ([place])   2. Stage 3 chronic kidney disease ([place])   Cancer Staging Cancer of right colon ([place]) Staging form: Colon and Rectum, AJCC 8th Edition - Clinical: cT1 - Unsigned - Pathologic stage from 03/08/2017: Stage IIA (pT3, pN0, cM0) - Signed by Earlie Server, MD on 03/08/2017  #History of stage IIA right side colon cancer, declined  adjuvant chemotherapy. Clinically doing well. CT was independently reviewed by me and discussed with patient No signs of recurrent tumor, local regional adenopathy or metastatic disease. CEA has been stable. Recommend CBC CMP CEA every 3 months. CT every 6 months for first 2 years.  #History of iron deficiency anemia, hemoglobin has been stable. #CKD,Creatinine stable.  Recommend CBC, CMP, CEA Q3 months.  All questions were answered. The patient knows to call the clinic with any problems questions or concerns. We spent sufficient time to discuss many aspect of care, questions were answered to patient's satisfaction.  Return of visit:  3 months.  We spent sufficient time to discuss many aspect of care, questions were answered to patient's satisfaction. Total face to face encounter time for this patient visit was 25 min. >50% of the time was  spent in counseling and coordination of care.     Earlie Server, MD, PhD Hematology Oncology [facility] at [facility] Pager- [phone] 12/27/2018

## 2018-12-27 NOTE — Progress Notes (Signed)
Patient here to discuss CT results.

## 2018-12-28 LAB — CEA: CEA: 2 ng/mL (ref 0.0–4.7)

## 2018-12-30 ENCOUNTER — Telehealth: Payer: Self-pay

## 2018-12-30 NOTE — Telephone Encounter (Signed)
Copied from [place] [phone]. Topic: General - Other >> Dec 30, 2018  8:23 AM Celene Kras A wrote: Reason for CRM: [employer] called regarding a PA needed for pts Zofran. Agent states the paperwork was not completed. Agent states they need a call back today because the paperwork is due today. Please advise. Callback #[phone] option 5.

## 2018-12-30 NOTE — Telephone Encounter (Signed)
PA for zofran submitted

## 2019-01-04 ENCOUNTER — Telehealth (HOSPITAL_COMMUNITY): Payer: Self-pay

## 2019-01-04 DIAGNOSIS — F900 Attention-deficit hyperactivity disorder, predominantly inattentive type: Secondary | ICD-10-CM

## 2019-01-04 MED ORDER — METHYLPHENIDATE HCL 5 MG PO TABS: 5.0000 mg | ORAL_TABLET | ORAL | 0 refills | Status: DC

## 2019-01-04 NOTE — Telephone Encounter (Signed)
done

## 2019-01-04 NOTE — Telephone Encounter (Signed)
Patient is calling for a refill on the Ritalin, she has a follow up in October and her pharmacy is correct in the chart

## 2019-02-01 ENCOUNTER — Ambulatory Visit: Payer: Medicare Other | Admitting: Internal Medicine

## 2019-02-02 DIAGNOSIS — Z23 Encounter for immunization: Secondary | ICD-10-CM | POA: Diagnosis not present

## 2019-02-07 ENCOUNTER — Other Ambulatory Visit: Payer: Self-pay

## 2019-02-07 ENCOUNTER — Ambulatory Visit (INDEPENDENT_AMBULATORY_CARE_PROVIDER_SITE_OTHER): Payer: Medicare Other | Admitting: Internal Medicine

## 2019-02-07 VITALS — Ht 62.0 in | Wt 155.0 lb

## 2019-02-07 DIAGNOSIS — Z85828 Personal history of other malignant neoplasm of skin: Secondary | ICD-10-CM | POA: Diagnosis not present

## 2019-02-07 DIAGNOSIS — Z1231 Encounter for screening mammogram for malignant neoplasm of breast: Secondary | ICD-10-CM | POA: Diagnosis not present

## 2019-02-07 DIAGNOSIS — E039 Hypothyroidism, unspecified: Secondary | ICD-10-CM

## 2019-02-07 DIAGNOSIS — Z13220 Encounter for screening for lipoid disorders: Secondary | ICD-10-CM

## 2019-02-07 DIAGNOSIS — C182 Malignant neoplasm of ascending colon: Secondary | ICD-10-CM | POA: Diagnosis not present

## 2019-02-07 DIAGNOSIS — R928 Other abnormal and inconclusive findings on diagnostic imaging of breast: Secondary | ICD-10-CM

## 2019-02-07 DIAGNOSIS — N6310 Unspecified lump in the right breast, unspecified quadrant: Secondary | ICD-10-CM

## 2019-02-07 DIAGNOSIS — Z1389 Encounter for screening for other disorder: Secondary | ICD-10-CM | POA: Diagnosis not present

## 2019-02-07 NOTE — Progress Notes (Signed)
Virtual Visit via Video Note  I connected with [name]   on 02/07/19 at  4:22 PM EDT by a video enabled telemedicine application and verified that I am speaking with the correct person using two identifiers.  Location patient: home Location provider:work or home office Persons participating in the virtual visit: patient, provider, pts husband  I discussed the limitations of evaluation and management by telemedicine and the availability of in person appointments. The patient expressed understanding and agreed to proceed.   HPI: 1. Hypothyroidism off medications x months will need to check TSH  2. H/o right colon cancer CEA normal 2.0 12/27/2018  3. H/o BCC/SCC, Aks and severely dysplastic nevi s/p removal LLE see [address] Q6 months     ROS: See pertinent positives and negatives per HPI.  Past Medical History:  Diagnosis Date  . Allergy   . Anemia   . Carcinoma ([place])   . Colon cancer ([place]) 01/2017  . Colon polyps   . Diverticulitis    01/2018 abscess hosp. 01/2018  . Endometriosis   . Fibromyalgia   . Gastric polyps   . GERD (gastroesophageal reflux disease)   . Hemorrhoids   . Hypothyroidism   . Iron deficiency anemia 03/08/2017  . Major depressive disorder, recurrent episode, mild (HCC)    Dr. Adele Schilder in [place] h/o ECT x 6 x and hosp x 2 and transcranial magnetic treatment   . Miscarriage    x2  . PONV (postoperative nausea and vomiting)   . Skin cancer    arm, thigh and chest Dr. Evorn Gong   . Sleep apnea    USES CPAP  . UTI (urinary tract infection)     Past Surgical History:  Procedure Laterality Date  . ABDOMINAL HYSTERECTOMY  1990   endometriosis  . BREAST BIOPSY Left 2004   benign  . CATARACT EXTRACTION W/PHACO Right 10/26/2018   Procedure: CATARACT EXTRACTION PHACO AND INTRAOCULAR LENS PLACEMENT ([place]) RIGHT TORIC LENS;  Surgeon: Leandrew Koyanagi, MD;  Location: [facility];  Service: Ophthalmology;  Laterality: Right;  sleep apnea   . CATARACT EXTRACTION, BILATERAL Bilateral 05/2012  . CHOLECYSTECTOMY  2001  . COLONOSCOPY WITH PROPOFOL N/A 12/28/2016   Procedure: COLONOSCOPY WITH PROPOFOL;  Surgeon: Manya Silvas, MD;  Location: [facility] ENDOSCOPY;  Service: Endoscopy;  Laterality: N/A;  . COLONOSCOPY WITH PROPOFOL N/A 01/17/2018   Procedure: COLONOSCOPY WITH PROPOFOL;  Surgeon: Manya Silvas, MD;  Location: [facility] ENDOSCOPY;  Service: Endoscopy;  Laterality: N/A;  . ESOPHAGOGASTRODUODENOSCOPY (EGD) WITH PROPOFOL N/A 12/28/2016   Procedure: ESOPHAGOGASTRODUODENOSCOPY (EGD) WITH PROPOFOL;  Surgeon: Manya Silvas, MD;  Location[facility] ENDOSCOPY;  Service: Endoscopy;  Laterality: N/A;  . EYE SURGERY     lens left eye, cataract right eye   . LAPAROSCOPIC RIGHT COLECTOMY Right 01/21/2017   Procedure: LAPAROSCOPIC ASSISTED RIGHT COLECTOMY;  Surgeon: Robert Bellow, MD;  Location: [facility] ORS;  Service: General;  Laterality: Right;  . TONSILLECTOMY      Family History  Problem Relation Age of Onset  . Depression Brother   . Alcohol abuse Brother   . Suicidality Brother   . Stroke Mother   . Hyperlipidemia Mother   . Hypertension Mother   . Clotting disorder Brother   . Cancer Brother        MDS  . Colon polyps Father   . Melanoma Sister   . Cancer Sister        skin MM  . Stroke Maternal Grandfather   .  Bipolar disorder Neg Hx     SOCIAL HX: married with grandkids    Current Outpatient Medications:  .  calcium-vitamin D (OSCAL WITH D) 500-200 MG-UNIT tablet, Take 2 tablets by mouth 2 (two) times daily., Disp: , Rfl:  .  cetirizine (ZYRTEC) 10 MG tablet, Take 10 mg by mouth daily as needed for allergies. , Disp: , Rfl:  .  estradiol (ESTRACE) 0.5 MG tablet, Take 1 tablet (0.5 mg total) by mouth daily., Disp: 90 tablet, Rfl: 3 .  lamoTRIgine (LAMICTAL) 200 MG tablet, Take 1 tablet (200 mg total) by mouth every morning., Disp: 90 tablet, Rfl: 0 .  methylphenidate (RITALIN) 5 MG tablet, Take 1 tablet (5 mg  total) by mouth every morning., Disp: 30 tablet, Rfl: 0 .  montelukast (SINGULAIR) 10 MG tablet, Take 1 tablet (10 mg total) by mouth at bedtime., Disp: 30 tablet, Rfl: 3 .  Multiple Vitamins-Minerals (MULTIVITAMIN WITH MINERALS) tablet, Take 1 tablet by mouth daily., Disp: , Rfl:  .  omeprazole (PRILOSEC) 20 MG capsule, Take 1 capsule (20 mg total) by mouth daily before breakfast., Disp: 90 capsule, Rfl: 3 .  ondansetron (ZOFRAN-ODT) 4 MG disintegrating tablet, Take 1 tablet (4 mg total) by mouth 2 (two) times daily as needed for nausea or vomiting. AND PRN if this continues see gastroenterology, Disp: 90 tablet, Rfl: 1 .  traZODone (DESYREL) 50 MG tablet, Take 1 tablet (50 mg total) by mouth at bedtime., Disp: 90 tablet, Rfl: 0 .  TRINTELLIX 20 MG TABS tablet, Take 1 tablet (20 mg total) by mouth every morning., Disp: 30 tablet, Rfl: 2 .  VITAMIN D PO, Take by mouth daily., Disp: , Rfl:   EXAM:  VITALS per patient if applicable:  GENERAL: alert, oriented, appears well and in no acute distress  HEENT: atraumatic, conjunttiva clear, no obvious abnormalities on inspection of external nose and ears  NECK: normal movements of the head and neck  LUNGS: on inspection no signs of respiratory distress, breathing rate appears normal, no obvious gross SOB, gasping or wheezing  CV: no obvious cyanosis  MS: moves all visible extremities without noticeable abnormality  PSYCH/NEURO: pleasant and cooperative, no obvious depression or anxiety, speech and thought processing grossly intact  ASSESSMENT AND PLAN:  Discussed the following assessment and plan:  Hypothyroidism, unspecified type - Plan: TSH  History of skin cancer BCC/SCC, severely DN b/l lower extremity  -f/u dermatology Q6 months   Cancer of right colon (HCC) CEA normal 12/27/2018 2.0  -f/u Dr. Tasia Catchings  -rec f/u GI by 01/2020   HM Flu shot per pt had 01/2019 week of 28th at [place]  Tdap 02/15/14 and 08/10/18 utd prevnar pna  231/28/20  03/2013 zostervax 1/2 shingrix 12/09/2018 HT will get 2nd dosse  hep C negative   mammo 10/11/18 rec Dx mammo and [name] of right breast in 6 monthsand screening mammogram -->needs to call to sch has not yet but will do rec she do 04/12/2019  S/p hysterectomy out of age window pap -pap 10/15/14 negative neg HPV  Colonoscopy 01/17/18 diverticulosis, [facility] Dr. Tiffany Kocher repeat in 2 years h/o colon cancer -per pt needs smallest scope and not sure if wants f/u 01/18/20 consider CT entero in the future   DEXA 12/21/17 osteopenia   Light smoker age 73 y.o   Dermatology Dr. Inez Catalina office Q6 months   Former PCP Dr. Clayborn Bigness requested records today including carotid US from 01/2018 in system w/o result. Pt to call/send letter to get results  faxed to [name]  -we discussed possible serious and likely etiologies, options for evaluation and workup, limitations of telemedicine visit vs in person visit, treatment, treatment risks and precautions. Pt prefers to treat via telemedicine empirically rather then risking or undertaking an in person visit at this moment. Patient agrees to seek prompt in person care if worsening, new symptoms arise, or if is not improving with treatment.   I discussed the assessment and treatment plan with the patient. The patient was provided an opportunity to ask questions and all were answered. The patient agreed with the plan and demonstrated an understanding of the instructions.   The patient was advised to call back or seek an in-person evaluation if the symptoms worsen or if the condition fails to improve as anticipated.  Time spent 15 minutes  [name] Jackson, MD

## 2019-02-09 ENCOUNTER — Encounter (HOSPITAL_COMMUNITY): Payer: Self-pay | Admitting: Psychiatry

## 2019-02-09 ENCOUNTER — Ambulatory Visit (INDEPENDENT_AMBULATORY_CARE_PROVIDER_SITE_OTHER): Payer: Medicare Other | Admitting: Psychiatry

## 2019-02-09 ENCOUNTER — Other Ambulatory Visit: Payer: Self-pay

## 2019-02-09 DIAGNOSIS — F900 Attention-deficit hyperactivity disorder, predominantly inattentive type: Secondary | ICD-10-CM

## 2019-02-09 DIAGNOSIS — F331 Major depressive disorder, recurrent, moderate: Secondary | ICD-10-CM | POA: Diagnosis not present

## 2019-02-09 MED ORDER — METHYLPHENIDATE HCL 5 MG PO TABS: 5.0000 mg | ORAL_TABLET | ORAL | 0 refills | Status: DC

## 2019-02-09 MED ORDER — TRINTELLIX 20 MG PO TABS: 20.0000 mg | ORAL_TABLET | ORAL | 2 refills | Status: DC

## 2019-02-09 MED ORDER — TRAZODONE HCL 50 MG PO TABS: 50.0000 mg | ORAL_TABLET | Freq: Every day | ORAL | 0 refills | Status: DC

## 2019-02-09 MED ORDER — LAMOTRIGINE 200 MG PO TABS: 200.0000 mg | ORAL_TABLET | ORAL | 0 refills | Status: DC

## 2019-02-09 NOTE — Progress Notes (Signed)
Virtual Visit via Telephone Note  I connected with [name] on 02/09/19 at  9:20 AM EDT by telephone and verified that I am speaking with the correct person using two identifiers.   I discussed the limitations, risks, security and privacy concerns of performing an evaluation and management service by telephone and the availability of in person appointments. I also discussed with the patient that there may be a patient responsible charge related to this service. The patient expressed understanding and agreed to proceed.   History of Present Illness: Patient was evaluated by phone session.  She is doing very well on her current medication.  She is sleeping good.  She denies any crying spells or any feeling of hopelessness or worthlessness.  Her attention, concentration is good.  She is able to do multitasking.  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  She has no tremors shakes or any EPS.  Recently she had a blood work which was normal.  Her energy level is good.  She lives with her husband who is very supportive.  Her husband is getting knee surgery in January.  Patient like to keep her current medication.  Past Psychiatric History:Reviewed. H/O mania, irritability, impulsive behavior and overdose at age 38. H/O inpatient twice. Had psychological testing and diagnosed ADD. Took Concerta, (Adderall, Vyvansemade anxious)Wellbutrin, Prozac, lithium, Effexor, Lexapro, Abilify and Cymbalta. Best when given female hormone to help endometriosis. Had ECT, did not work. TMS helped. No history of psychosis or any hallucination.  Recent Results (from the past 2160 hour(s))  Comprehensive metabolic panel     Status: Abnormal   Collection Time: 12/27/18 10:07 AM  Result Value Ref Range   Sodium 141 135 - 145 mmol/L   Potassium 4.5 3.5 - 5.1 mmol/L   Chloride 106 98 - 111 mmol/L   CO2 29 22 - 32 mmol/L   Glucose, Bld 84 70 - 99 mg/dL   BUN 12 8 - 23 mg/dL    Creatinine, Ser 0.99 0.44 - 1.00 mg/dL   Calcium 9.5 8.9 - 10.3 mg/dL   Total Protein 6.7 6.5 - 8.1 g/dL   Albumin 3.8 3.5 - 5.0 g/dL   AST 13 (L) 15 - 41 U/L   ALT 15 0 - 44 U/L   Alkaline Phosphatase 59 38 - 126 U/L   Total Bilirubin 0.6 0.3 - 1.2 mg/dL   GFR calc non Af Amer 57 (L) >60 mL/min   GFR calc Af Amer >60 >60 mL/min   Anion gap 6 5 - 15    Comment: Performed at [facility], [address] [place], [place] [address]  CBC with Differential/Platelet     Status: None   Collection Time: 12/27/18 10:07 AM  Result Value Ref Range   WBC 8.2 4.0 - 10.5 K/uL   RBC 4.24 3.87 - 5.11 MIL/uL   Hemoglobin 13.1 12.0 - 15.0 g/dL   HCT 39.8 36.0 - 46.0 %   MCV 93.9 80.0 - 100.0 fL   MCH 30.9 26.0 - 34.0 pg   MCHC 32.9 30.0 - 36.0 g/dL   RDW 13.0 11.5 - 15.5 %   Platelets 235 150 - 400 K/uL   nRBC 0.0 0.0 - 0.2 %   Neutrophils Relative % 48 %   Neutro Abs 4.0 1.7 - 7.7 K/uL   Lymphocytes Relative 46 %   Lymphs Abs 3.8 0.7 - 4.0 K/uL   Monocytes Relative 5 %   Monocytes  Absolute 0.4 0.1 - 1.0 K/uL   Eosinophils Relative 0 %   Eosinophils Absolute 0.0 0.0 - 0.5 K/uL   Basophils Relative 1 %   Basophils Absolute 0.1 0.0 - 0.1 K/uL   Immature Granulocytes 0 %   Abs Immature Granulocytes 0.01 0.00 - 0.07 K/uL    Comment: Performed at [facility], [address] [place], [place] [address]  CEA     Status: None   Collection Time: 12/27/18 10:07 AM  Result Value Ref Range   CEA 2.0 0.0 - 4.7 ng/mL    Comment: (NOTE)                             Nonsmokers          <3.9                             Smokers             <5.6 [employer] Electrochemiluminescence Immunoassay (ECLIA) Values obtained with different assay methods or kits cannot be used interchangeably.  Results cannot be interpreted as absolute evidence of the presence or absence of malignant disease. Performed At: [facility] [place], [place] [id number] Rush Farmer  MD [id number]       Psychiatric Specialty Exam: Physical Exam  ROS  There were no vitals taken for this visit.There is no height or weight on file to calculate BMI.  General Appearance: NA  Eye Contact:  NA  Speech:  Clear and Coherent and Normal Rate  Volume:  Normal  Mood:  Euthymic  Affect:  NA  Thought Process:  Goal Directed  Orientation:  Full (Time, Place, and Person)  Thought Content:  WDL and Logical  Suicidal Thoughts:  No  Homicidal Thoughts:  No  Memory:  Immediate;   Good Recent;   Good Remote;   Good  Judgement:  Good  Insight:  Good  Psychomotor Activity:  NA  Concentration:  Concentration: Good and Attention Span: Good  Recall:  Good  Fund of Knowledge:  Good  Language:  Good  Akathisia:  No  Handed:  Right  AIMS (if indicated):     Assets:  Communication Skills Desire for Improvement Housing Resilience Social Support  ADL's:  Intact  Cognition:  WNL  Sleep:   good      Assessment and Plan: Major depressive disorder, recurrent.  Attention deficit disorder, inattentive type.  I reviewed blood work results.  Patient is a stable on her current medication.  She does not want to change medication.  She has no rash, itching, tremors or shakes.  Continue Ritalin 5 mg in the morning, Lamictal 200 mg daily, Trintellix 20 mg in the morning and trazodone 50 mg at bedtime.  Discussed medication side effects and benefits.  She had started working Psychologist, occupational at gift shop in the hospital.  Recommended to call us back if she is any question or any concern.  She is not interested in therapy.  Follow-up in 3 months  Follow Up Instructions:    I discussed the assessment and treatment plan with the patient. The patient was provided an opportunity to ask questions and all were answered. The patient agreed with the plan and demonstrated an understanding of the instructions.   The patient was advised to call back or seek an in-person evaluation if the symptoms worsen  or if the condition  fails to improve as anticipated.  I provided 20 minutes of non-face-to-face time during this encounter.   Kathlee Nations, MD

## 2019-02-10 ENCOUNTER — Other Ambulatory Visit (HOSPITAL_COMMUNITY): Payer: Self-pay | Admitting: Psychiatry

## 2019-02-10 DIAGNOSIS — F331 Major depressive disorder, recurrent, moderate: Secondary | ICD-10-CM

## 2019-02-20 ENCOUNTER — Ambulatory Visit (INDEPENDENT_AMBULATORY_CARE_PROVIDER_SITE_OTHER): Payer: Medicare Other

## 2019-02-20 ENCOUNTER — Other Ambulatory Visit: Payer: Self-pay

## 2019-02-20 DIAGNOSIS — Z0000 Encounter for general adult medical examination without abnormal findings: Secondary | ICD-10-CM | POA: Diagnosis not present

## 2019-02-20 NOTE — Progress Notes (Signed)
Subjective:   [name] is a 73 y.o. female who presents for Medicare Annual (Subsequent) preventive examination.  Review of Systems:  No ROS.  Medicare Wellness Virtual Visit.  Visual/audio telehealth visit, UTA vital signs.   See social history for additional risk factors.   Cardiac Risk Factors include: advanced age (>23men, >68 women)     Objective:     Vitals: LMP  (LMP Unknown)   There is no height or weight on file to calculate BMI.  Advanced Directives 02/20/2019 12/27/2018 10/26/2018 08/18/2018 01/29/2018 01/29/2018 01/17/2018  Does Patient Have a Medical Advance Directive? Yes Yes Yes Yes Yes Yes Yes  Type of Paramedic of [place];Living will [place];Living will [place];Living will [place];Living will [place];Living will [place];Living will [place];Living will  Does patient want to make changes to medical advance directive? No - Patient declined - No - Patient declined - No - Patient declined - -  Copy of [place] in Chart? Yes - validated most recent copy scanned in chart (See row information) - Yes - validated most recent copy scanned in chart (See row information) - Yes Yes Yes  Would patient like information on creating a medical advance directive? - - No - Patient declined - - - -  Some encounter information is confidential and restricted. Go to Review Flowsheets activity to see all data.    Tobacco Social History   Tobacco Use  Smoking Status Never Smoker  Smokeless Tobacco Never Used     Counseling given: Not Answered   Clinical Intake:  Pre-visit preparation completed: Yes        Diabetes: No  How often do you need to have someone help you when you read instructions, pamphlets, or other written materials from your doctor or pharmacy?: 1 - Never  Interpreter Needed?: No      Past Medical History:  Diagnosis Date  . Allergy   . Anemia   . Carcinoma ([place])   . Colon cancer ([place]) 01/2017  . Colon polyps   . Diverticulitis    01/2018 abscess hosp. 01/2018  . Endometriosis   . Fibromyalgia   . Gastric polyps   . GERD (gastroesophageal reflux disease)   . Hemorrhoids   . Hypothyroidism   . Iron deficiency anemia 03/08/2017  . Major depressive disorder, recurrent episode, mild (HCC)    Dr. Adele Schilder in [place] h/o ECT x 6 x and hosp x 2 and transcranial magnetic treatment   . Miscarriage    x2  . PONV (postoperative nausea and vomiting)   . Skin cancer    arm, thigh and chest Dr. Evorn Gong   . Sleep apnea    USES CPAP  . UTI (urinary tract infection)    Past Surgical History:  Procedure Laterality Date  . ABDOMINAL HYSTERECTOMY  1990   endometriosis  . BREAST BIOPSY Left 2004   benign  . CATARACT EXTRACTION W/PHACO Right 10/26/2018   Procedure: CATARACT EXTRACTION PHACO AND INTRAOCULAR LENS PLACEMENT ([place]) RIGHT TORIC LENS;  Surgeon: Leandrew Koyanagi, MD;  Location: [facility];  Service: Ophthalmology;  Laterality: Right;  sleep apnea  . CATARACT EXTRACTION, BILATERAL Bilateral 05/2012  . CHOLECYSTECTOMY  2001  . COLONOSCOPY WITH PROPOFOL N/A 12/28/2016   Procedure: COLONOSCOPY WITH PROPOFOL;  Surgeon: Manya Silvas, MD;  [facility] END[facility]COPY;  Service: Endoscopy;  Laterality: N/A;  . COLONOSCOPY WITH PROPOFOL N/A  01/17/2018   Procedure: COLONOSCOPY WITH PROPOFOL;  Surgeon: Manya Silvas, MD;  Location: [facility] ENDOSCOPY;  Service: Endoscopy;  Laterality: N/A;  . ESOPHAGOGASTRODUODENOSCOPY (EGD) WITH PROPOFOL N/A 12/28/2016   Procedure: ESOPHAGOGASTRODUODENOSCOPY (EGD) WITH PROPOFOL;  Surgeon: Manya Silvas, MD;  Location: [facility] ENDOSCOPY;  Service: Endoscopy;  Laterality: N/A;  . EYE SURGERY     lens left eye, cataract right eye   . LAPAROSCOPIC RIGHT COLECTOMY Right 01/21/2017   Procedure: LAPAROSCOPIC ASSISTED RIGHT COLECTOMY;   Surgeon: Robert Bellow, MD;  Location: [facility] ORS;  Service: General;  Laterality: Right;  . TONSILLECTOMY     Family History  Problem Relation Age of Onset  . Depression Brother   . Alcohol abuse Brother   . Suicidality Brother   . Stroke Mother   . Hyperlipidemia Mother   . Hypertension Mother   . Clotting disorder Brother   . Cancer Brother        MDS  . Colon polyps Father   . Melanoma Sister   . Cancer Sister        skin MM  . Stroke Maternal Grandfather   . Melanoma Nephew   . Bipolar disorder Neg Hx    Social History   Socioeconomic History  . Marital status: Married    Spouse name: Not on file  . Number of children: Not on file  . Years of education: Not on file  . Highest education level: Not on file  Occupational History  . Not on file  Social Needs  . Financial resource strain: Not hard at all  . Food insecurity    Worry: Never true    Inability: Never true  . Transportation needs    Medical: No    Non-medical: No  Tobacco Use  . Smoking status: Never Smoker  . Smokeless tobacco: Never Used  Substance and Sexual Activity  . Alcohol use: Not Currently    Alcohol/week: 0.0 standard drinks    Comment:    . Drug use: No  . Sexual activity: Yes    Partners: Male    Birth control/protection: None  Lifestyle  . Physical activity    Days per week: Not on file    Minutes per session: Not on file  . Stress: Not at all  Relationships  . Social Herbalist on phone: Not on file    Gets together: Not on file    Attends religious service: Not on file    Active member of club or organization: Not on file    Attends meetings of clubs or organizations: Not on file    Relationship status: Not on file  Other Topics Concern  . 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 calcium-vitamin D (OSCAL WITH D) 500-200 MG-UNIT tablet Take 2 tablets by mouth 2 (two) times daily.  . cetirizine (ZYRTEC) 10 MG tablet Take 10 mg by mouth daily as needed for allergies.   Marland Kitchen estradiol (ESTRACE) 0.5 MG tablet Take 1 tablet (0.5 mg total) by mouth daily.  Marland Kitchen lamoTRIgine (LAMICTAL) 200 MG tablet Take 1 tablet (200 mg total) by mouth every morning.  . methylphenidate (RITALIN) 5 MG tablet Take 1 tablet (5 mg total) by  mouth every morning.  . montelukast (SINGULAIR) 10 MG tablet Take 1 tablet (10 mg total) by mouth at bedtime.  . Multiple Vitamins-Minerals (MULTIVITAMIN WITH MINERALS) tablet Take 1 tablet by mouth daily.  Marland Kitchen omeprazole (PRILOSEC) 20 MG capsule Take 1 capsule (20 mg total) by mouth daily before breakfast.  . ondansetron (ZOFRAN-ODT) 4 MG disintegrating tablet Take 1 tablet (4 mg total) by mouth 2 (two) times daily as needed for nausea or vomiting. AND PRN if this continues see gastroenterology  . traZODone (DESYREL) 50 MG tablet Take 1 tablet (50 mg total) by mouth at bedtime.  . TRINTELLIX 20 MG TABS tablet Take 1 tablet (20 mg total) by mouth every morning.  Marland Kitchen VITAMIN D PO Take by mouth daily.  . [DISCONTINUED] prochlorperazine (COMPAZINE) 10 MG tablet Take 1 tablet (10 mg total) every 6 (six) hours as needed by mouth (Nausea or vomiting).   No facility-administered encounter medications on file as of 02/20/2019.     Activities of Daily Living In your present state of health, do you have any difficulty performing the following activities: 02/20/2019  Hearing? N  Vision? N  Difficulty concentrating or making decisions? N  Walking or climbing stairs? N  Dressing or bathing? N  Doing errands, shopping? N  Preparing Food and eating ? N  Using the Toilet? N  In the past six months, have you accidently leaked urine? N  Do you have problems with loss of bowel control? N  Managing your Medications? N  Managing your Finances? N   Housekeeping or managing your Housekeeping? N  Some recent data might be hidden    Patient Care Team: McLean-Scocuzza, Nino Glow, MD as PCP - General (Internal Medicine) Manya Silvas, MD (Gastroenterology) Bary Castilla Forest Gleason, MD (General Surgery) Clent Jacks, RN as Registered Nurse    Assessment:   This is a routine wellness examination for Nimra.  I connected with patient 02/20/19 at 11:00 AM EDT by an audio enabled telemedicine application and verified that I am speaking with the correct person using two identifiers. Patient stated full name and DOB. Patient gave permission to continue with virtual visit. Patient's location was at home and Nurse's location was at [place] office.   Health Maintenance Due: -Mammogram- scheduled 04/13/19 Update all pending maintenance due as appropriate.   See completed HM at the end of note.   Eye: Visual acuity not assessed. Virtual visit. Bilateral cataracts extracted. Followed by their ophthalmologist every 12 months.   Dental: Visits every 6 months.    Hearing: Demonstrates normal hearing during visit.  Safety:  Patient feels safe at home- yes Patient does have smoke detectors at home- yes Patient does wear sunscreen or protective clothing when in direct sunlight - yes Patient does wear seat belt when in a moving vehicle - yes Patient drives- yes Adequate lighting in walkways free from debris- yes Grab bars and handrails used as appropriate- yes Ambulates with no assistive device Cell phone on person when ambulating outside of the home- yes  Social: Alcohol intake - no       Smoking history- never   Smokers in home? none Illicit drug use? none  Depression: PHQ 2 &9 complete. See screening below. Denies irritability, anhedonia, sadness/tearfullness.  Stable. Followed by psychiatry every 3 months.   Falls: See screening below.    Medication: Taking as directed and without issues.   Covid-19: Precautions and  sickness symptoms discussed. Wears mask, social distancing, hand hygiene as appropriate.   Activities of  Daily Living Patient denies needing assistance with: household chores, feeding themselves, getting from bed to chair, getting to the toilet, bathing/showering, dressing, managing money, or preparing meals.   Memory: Patient is alert. Patient denies difficulty focusing or concentrating. Correctly identified the president of the [place], season and recall 2/3. Patient likes to play soduko, and complete puzzles for brain stimulation. 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   BMI- discussed the importance of a healthy diet, water intake and the benefits of aerobic exercise.  Educational material provided.  Physical activity- stretching, walking.  Diet: low carb Water: good intake Caffeine: 1 cup of coffee  Other Providers Patient Care Team: McLean-Scocuzza, Nino Glow, MD as PCP - General (Internal Medicine) Manya Silvas, MD (Gastroenterology) Bary Castilla, Forest Gleason, MD (General Surgery) Clent Jacks, RN as Registered Nurse  Exercise Activities and Dietary recommendations Current Exercise Habits: Home exercise routine, Type of exercise: walking, Frequency (Times/Week): 2, Intensity: Mild  Goals      Patient Stated   . Increase physical activity (pt-stated)     I want to walk more       Fall Risk Fall Risk  02/20/2019 02/07/2019 05/31/2018 03/22/2018 11/24/2017  Falls in the past year? 0 0 0 0 No  Comment - - - - -   Timed Get Up and Go performed: no, virtual visit  Depression Screen PHQ 2/9 Scores 02/20/2019 02/07/2019 03/22/2018 11/24/2017  PHQ - 2 Score 0 0 0 0     Cognitive Function MMSE - Mini Mental State Exam 11/24/2017  Orientation to time 5  Orientation to Place 5  Registration 3  Attention/ Calculation 2  Recall 3  Language- name 2 objects 2  Language- repeat 1  Language- follow 3 step command 3  Language- read & follow  direction 1  Write a sentence 1  Copy design 1  Total score 27     6CIT Screen 02/20/2019  What Year? 0 points  What month? 0 points  What time? 0 points  Count back from 20 0 points  Months in reverse 0 points    Immunization History  Administered Date(s) Administered  . DTaP 02/15/2014  . Influenza Split 03/08/2017  . Influenza-Unspecified 01/30/2019  . Pneumococcal Conjugate-13 02/15/2014  . Pneumococcal Polysaccharide-23 12/25/2009, 05/31/2018  . Tdap 08/10/2018  . Zoster Recombinat (Shingrix) 12/09/2018   Screening Tests Health Maintenance  Topic Date Due  . MAMMOGRAM  12/22/2019  . COLONOSCOPY  01/18/2028  . TETANUS/TDAP  08/09/2028  . INFLUENZA VACCINE  Completed  . DEXA SCAN  Completed  . Hepatitis C Screening  Completed  . PNA vac Low Risk Adult  Completed      Plan:   Keep all routine maintenance appointments.   Next scheduled lab @ 03/14/19  Follow up 06/13/19 @ 1:30  Medicare Attestation I have personally reviewed: The patient's medical and social history Their use of alcohol, tobacco or illicit drugs Their current medications and supplements The patient's functional ability including ADLs,fall risks, home safety risks, cognitive, and hearing and visual impairment Diet and physical activities Evidence for depression   In addition, I have reviewed and discussed with patient certain preventive protocols, quality metrics, and best practice recommendations. A written personalized care plan for preventive services as well as general preventive health recommendations were provided to patient via mail.     Varney Biles, LPN  [id number]

## 2019-02-20 NOTE — Patient Instructions (Addendum)
  Ms. Clayman , Thank you for taking time to come for your Medicare Wellness Visit. I appreciate your ongoing commitment to your health goals. Please review the following plan we discussed and let me know if I can assist you in the future.   These are the goals we discussed: Goals      Patient Stated   . Increase physical activity (pt-stated)     I want to walk more       This is a list of the screening recommended for you and due dates:  Health Maintenance  Topic Date Due  . Mammogram  12/22/2019  . Colon Cancer Screening  01/18/2028  . Tetanus Vaccine  08/09/2028  . Flu Shot  Completed  . DEXA scan (bone density measurement)  Completed  .  Hepatitis C: One time screening is recommended by Center for Disease Control  (CDC) for  adults born from [birth year] through 1963.   Completed  . Pneumonia vaccines  Completed

## 2019-03-09 ENCOUNTER — Telehealth (HOSPITAL_COMMUNITY): Payer: Self-pay

## 2019-03-09 DIAGNOSIS — F900 Attention-deficit hyperactivity disorder, predominantly inattentive type: Secondary | ICD-10-CM

## 2019-03-09 MED ORDER — METHYLPHENIDATE HCL 5 MG PO TABS: 5.0000 mg | ORAL_TABLET | ORAL | 0 refills | Status: DC

## 2019-03-09 NOTE — Telephone Encounter (Signed)
done

## 2019-03-09 NOTE — Telephone Encounter (Signed)
Patient is calling for a refill on her Ritalin, she uses [facility] [place] 

## 2019-03-10 ENCOUNTER — Other Ambulatory Visit: Payer: Self-pay

## 2019-03-14 ENCOUNTER — Other Ambulatory Visit: Payer: Self-pay

## 2019-03-14 ENCOUNTER — Other Ambulatory Visit: Payer: Self-pay | Admitting: Internal Medicine

## 2019-03-14 ENCOUNTER — Other Ambulatory Visit (INDEPENDENT_AMBULATORY_CARE_PROVIDER_SITE_OTHER): Payer: Medicare Other

## 2019-03-14 DIAGNOSIS — Z13220 Encounter for screening for lipoid disorders: Secondary | ICD-10-CM | POA: Diagnosis not present

## 2019-03-14 DIAGNOSIS — E039 Hypothyroidism, unspecified: Secondary | ICD-10-CM | POA: Diagnosis not present

## 2019-03-14 DIAGNOSIS — Z1389 Encounter for screening for other disorder: Secondary | ICD-10-CM

## 2019-03-14 DIAGNOSIS — N959 Unspecified menopausal and perimenopausal disorder: Secondary | ICD-10-CM

## 2019-03-14 LAB — LIPID PANEL
Cholesterol: 200 mg/dL (ref 0–200)
HDL: 67.6 mg/dL (ref >39.00)
LDL Cholesterol: 112 mg/dL — ABNORMAL HIGH (ref 0–99)
NonHDL: 132.45
Total CHOL/HDL Ratio: 3
Triglycerides: 102 mg/dL (ref 0.0–149.0)
VLDL: 20.4 mg/dL (ref 0.0–40.0)

## 2019-03-14 LAB — TSH: TSH: 4.78 u[IU]/mL — ABNORMAL HIGH (ref 0.35–4.50)

## 2019-03-14 MED ORDER — ESTRADIOL 0.5 MG PO TABS: 0.5000 mg | ORAL_TABLET | Freq: Every day | ORAL | 3 refills | Status: DC

## 2019-03-15 ENCOUNTER — Other Ambulatory Visit: Payer: Self-pay | Admitting: Internal Medicine

## 2019-03-15 DIAGNOSIS — E039 Hypothyroidism, unspecified: Secondary | ICD-10-CM

## 2019-03-15 LAB — URINALYSIS, ROUTINE W REFLEX MICROSCOPIC
Bilirubin Urine: NEGATIVE
Glucose, UA: NEGATIVE
Hgb urine dipstick: NEGATIVE
Ketones, ur: NEGATIVE
Leukocytes,Ua: NEGATIVE
Nitrite: NEGATIVE
Protein, ur: NEGATIVE
Specific Gravity, Urine: 1.003 (ref 1.001–1.03)
pH: 7 (ref 5.0–8.0)

## 2019-03-15 MED ORDER — LEVOTHYROXINE SODIUM 25 MCG PO TABS: 25.0000 ug | ORAL_TABLET | Freq: Every day | ORAL | 3 refills | Status: DC

## 2019-03-21 ENCOUNTER — Ambulatory Visit: Payer: Medicare Other | Admitting: Oncology

## 2019-03-21 ENCOUNTER — Other Ambulatory Visit: Payer: Medicare Other

## 2019-03-23 ENCOUNTER — Other Ambulatory Visit: Payer: Medicare Other

## 2019-03-28 ENCOUNTER — Ambulatory Visit: Payer: Medicare Other | Admitting: Oncology

## 2019-03-28 ENCOUNTER — Other Ambulatory Visit: Payer: Medicare Other

## 2019-04-03 ENCOUNTER — Telehealth (HOSPITAL_COMMUNITY): Payer: Self-pay | Admitting: *Deleted

## 2019-04-03 DIAGNOSIS — F900 Attention-deficit hyperactivity disorder, predominantly inattentive type: Secondary | ICD-10-CM

## 2019-04-03 NOTE — Telephone Encounter (Signed)
Pt left message requesting refill of her Ritalin 5mg . Her next appointment is on 05/12/19.

## 2019-04-04 ENCOUNTER — Inpatient Hospital Stay (HOSPITAL_BASED_OUTPATIENT_CLINIC_OR_DEPARTMENT_OTHER): Payer: Medicare Other | Admitting: Oncology

## 2019-04-04 ENCOUNTER — Inpatient Hospital Stay: Payer: Medicare Other | Attending: Oncology

## 2019-04-04 ENCOUNTER — Encounter: Payer: Self-pay | Admitting: Oncology

## 2019-04-04 ENCOUNTER — Other Ambulatory Visit: Payer: Self-pay

## 2019-04-04 VITALS — BP 124/76 | HR 69 | Temp 97.2°F | Resp 18 | Wt 153.1 lb

## 2019-04-04 DIAGNOSIS — R102 Pelvic and perineal pain: Secondary | ICD-10-CM | POA: Diagnosis not present

## 2019-04-04 DIAGNOSIS — D509 Iron deficiency anemia, unspecified: Secondary | ICD-10-CM | POA: Insufficient documentation

## 2019-04-04 DIAGNOSIS — N189 Chronic kidney disease, unspecified: Secondary | ICD-10-CM | POA: Insufficient documentation

## 2019-04-04 DIAGNOSIS — Z85038 Personal history of other malignant neoplasm of large intestine: Secondary | ICD-10-CM | POA: Diagnosis not present

## 2019-04-04 DIAGNOSIS — C182 Malignant neoplasm of ascending colon: Secondary | ICD-10-CM

## 2019-04-04 DIAGNOSIS — R112 Nausea with vomiting, unspecified: Secondary | ICD-10-CM

## 2019-04-04 LAB — CBC WITH DIFFERENTIAL/PLATELET
Abs Immature Granulocytes: 0.01 10*3/uL (ref 0.00–0.07)
Basophils Absolute: 0.1 10*3/uL (ref 0.0–0.1)
Basophils Relative: 1 %
Eosinophils Absolute: 0.1 10*3/uL (ref 0.0–0.5)
Eosinophils Relative: 1 %
HCT: 39.4 % (ref 36.0–46.0)
Hemoglobin: 12.8 g/dL (ref 12.0–15.0)
Immature Granulocytes: 0 %
Lymphocytes Relative: 46 %
Lymphs Abs: 4.1 10*3/uL — ABNORMAL HIGH (ref 0.7–4.0)
MCH: 30.6 pg (ref 26.0–34.0)
MCHC: 32.5 g/dL (ref 30.0–36.0)
MCV: 94.3 fL (ref 80.0–100.0)
Monocytes Absolute: 0.4 10*3/uL (ref 0.1–1.0)
Monocytes Relative: 5 %
Neutro Abs: 4.2 10*3/uL (ref 1.7–7.7)
Neutrophils Relative %: 47 %
Platelets: 230 10*3/uL (ref 150–400)
RBC: 4.18 MIL/uL (ref 3.87–5.11)
RDW: 12.5 % (ref 11.5–15.5)
WBC: 8.8 10*3/uL (ref 4.0–10.5)
nRBC: 0 % (ref 0.0–0.2)

## 2019-04-04 LAB — COMPREHENSIVE METABOLIC PANEL
ALT: 17 U/L (ref 0–44)
AST: 16 U/L (ref 15–41)
Albumin: 3.9 g/dL (ref 3.5–5.0)
Alkaline Phosphatase: 63 U/L (ref 38–126)
Anion gap: 6 (ref 5–15)
BUN: 10 mg/dL (ref 8–23)
CO2: 28 mmol/L (ref 22–32)
Calcium: 9.1 mg/dL (ref 8.9–10.3)
Chloride: 106 mmol/L (ref 98–111)
Creatinine, Ser: 1.04 mg/dL — ABNORMAL HIGH (ref 0.44–1.00)
GFR calc Af Amer: >60 mL/min (ref >60)
GFR calc non Af Amer: 54 mL/min — ABNORMAL LOW (ref >60)
Glucose, Bld: 94 mg/dL (ref 70–99)
Potassium: 4.3 mmol/L (ref 3.5–5.1)
Sodium: 140 mmol/L (ref 135–145)
Total Bilirubin: 0.4 mg/dL (ref 0.3–1.2)
Total Protein: 7 g/dL (ref 6.5–8.1)

## 2019-04-04 MED ORDER — METHYLPHENIDATE HCL 5 MG PO TABS: 5.0000 mg | ORAL_TABLET | ORAL | 0 refills | Status: DC

## 2019-04-04 NOTE — Addendum Note (Signed)
Addended by: Berniece Andreas T on: 04/04/2019 09:19 AM   Modules accepted: Orders

## 2019-04-04 NOTE — Progress Notes (Signed)
Patient here for follow up. Patient complains of feeling nauseated almost every day. She is having lower abdominal pain off and on.

## 2019-04-04 NOTE — Telephone Encounter (Signed)
done

## 2019-04-05 LAB — CEA: CEA: 1.6 ng/mL (ref 0.0–4.7)

## 2019-04-05 NOTE — Progress Notes (Signed)
Hematology/Oncology Follow Up Note [facility] Telephone:([phone] [phone] Patient Care Team: McLean-Scocuzza, Nino Glow, MD as PCP - General (Internal Medicine) Manya Silvas, MD (Inactive) (Gastroenterology) Bary Castilla, Forest Gleason, MD (General Surgery) Clent Jacks, RN as Registered Nurse  REASON FOR VISIT Follow up for treatment of management of colon cancer and iron deficiency anemia.  HISTORY OF PRESENTING ILLNESS:  [name] is a @ 73 y.o.  female with PMH listed below who was referred to me for evaluation and management of colon cancer.  a colonoscopy done on 12/25/2016 by Dr. Vira Agar. Moves her bowels daily. Patient states she is nausea, states it started at the end of June .She states she was having pain in her left upper quadrant pain was seen in the ER on 12/24/2016 Upper and lower endoscopies dated 12/28/2016 reviewed which showed Cecal mass, biopsy-proven adenocarcinoma. Patient underwent laparoscopically assisted right hemicolectomy with ileotransverse colostomy. Pathology revealed pT3N0 adenocarcinoma, with perineural invasion and lymphvascular invasion  #  Cancer Stage: pT3N0cM0, stage colon cancer.  Declined adjuvant chemotherapy. #Repeat colonoscopy 01/17/2018 by Dr. Vira Agar showed diverticulosis in the sigmoid colon, in the descending colon and transverse colon.  Internal hemorrhoids.  No specimen collected.  Interval History Patient presents for follow-up of colon cancer and iron deficiency anemia.  Patient reports feeling nauseated almost every day. Also pelvic pain, intermittent, sometimes triggered by intercourse.  No postcoital bleeding..  She reports that this is a chronic problem for 4 to 5 years.  No alleviating factors or worsening factors.   Denies any blood in the stool.    Review of Systems  Constitutional: Negative for chills, fever, malaise/fatigue and weight loss.  HENT: Negative for congestion, ear  discharge, ear pain, hearing loss, nosebleeds, sinus pain, sore throat and tinnitus.   Eyes: Negative for blurred vision, double vision, photophobia, pain, discharge and redness.  Respiratory: Negative for cough, hemoptysis, sputum production, shortness of breath and wheezing.   Cardiovascular: Negative for chest pain, palpitations, orthopnea, claudication and leg swelling.  Gastrointestinal: Positive for abdominal pain and nausea. Negative for blood in stool, constipation, diarrhea, heartburn, melena and vomiting.  Genitourinary: Negative for dysuria, flank pain, frequency and hematuria.  Musculoskeletal: Negative for back pain, myalgias and neck pain.  Skin: Negative for itching and rash.  Neurological: Negative for dizziness, tingling, tremors, focal weakness, weakness and headaches.  Endo/Heme/Allergies: Negative for environmental allergies. Does not bruise/bleed easily.  Psychiatric/Behavioral: Negative for depression, hallucinations and substance abuse. The patient is not nervous/anxious.      MEDICAL HISTORY:  Past Medical History:  Diagnosis Date  . Allergy   . Anemia   . Carcinoma ([place])   . Colon cancer ([place]) 01/2017  . Colon polyps   . Diverticulitis    01/2018 abscess hosp. 01/2018  . Endometriosis   . Fibromyalgia   . Gastric polyps   . GERD (gastroesophageal reflux disease)   . Hemorrhoids   . Hypothyroidism   . Iron deficiency anemia 03/08/2017  . Major depressive disorder, recurrent episode, mild (HCC)    Dr. Adele Schilder in [place] h/o ECT x 6 x and hosp x 2 and transcranial magnetic treatment   . Miscarriage    x2  . PONV (postoperative nausea and vomiting)   . Skin cancer    arm, thigh and chest Dr. Evorn Gong   . Sleep apnea    USES CPAP  . UTI (urinary tract infection)     SURGICAL HISTORY: Past Surgical History:  Procedure Laterality Date  .  ABDOMINAL HYSTERECTOMY  1990   endometriosis  . BREAST BIOPSY Left 2004   benign  . CATARACT EXTRACTION W/PHACO Right  10/26/2018   Procedure: CATARACT EXTRACTION PHACO AND INTRAOCULAR LENS PLACEMENT ([place]) RIGHT TORIC LENS;  Surgeon: Leandrew Koyanagi, MD;  Location: [facility];  Service: Ophthalmology;  Laterality: Right;  sleep apnea  . CATARACT EXTRACTION, BILATERAL Bilateral 05/2012  . CHOLECYSTECTOMY  2001  . COLONOSCOPY WITH PROPOFOL N/A 12/28/2016   Procedure: COLONOSCOPY WITH PROPOFOL;  Surgeon: Manya Silvas, MD;  Location: [facility] ENDOSCOPY;  Service: Endoscopy;  Laterality: N/A;  . COLONOSCOPY WITH PROPOFOL N/A 01/17/2018   Procedure: COLONOSCOPY WITH PROPOFOL;  Surgeon: Manya Silvas, MD;  Location: [facility] ENDOSCOPY;  Service: Endoscopy;  Laterality: N/A;  . ESOPHAGOGASTRODUODENOSCOPY (EGD) WITH PROPOFOL N/A 12/28/2016   Procedure: ESOPHAGOGASTRODUODENOSCOPY (EGD) WITH PROPOFOL;  Surgeon: Manya Silvas, MD;  Location: [facility] ENDOSCOPY;  Service: Endoscopy;  Laterality: N/A;  . EYE SURGERY     lens left eye, cataract right eye   . LAPAROSCOPIC RIGHT COLECTOMY Right 01/21/2017   Procedure: LAPAROSCOPIC ASSISTED RIGHT COLECTOMY;  Surgeon: Robert Bellow, MD;  Location: [facility] ORS;  Service: General;  Laterality: Right;  . TONSILLECTOMY      SOCIAL HISTORY: Social History   Socioeconomic History  . Marital status: Married    Spouse name: Not on file  . Number of children: Not on file  . Years of education: Not on file  . Highest education level: Not on file  Occupational History  . Not on file  Social Needs  . Financial resource strain: Not hard at all  . Food insecurity    Worry: Never true    Inability: Never true  . Transportation needs    Medical: No    Non-medical: No  Tobacco Use  . Smoking status: Never Smoker  . Smokeless tobacco: Never Used  Substance and Sexual Activity  . Alcohol use: Not Currently    Alcohol/week: 0.0 standard drinks    Comment:    . Drug use: No  . Sexual activity: Yes    Partners: Male    Birth control/protection: None  Lifestyle  .  Physical activity    Days per week: Not on file    Minutes per session: Not on file  . Stress: Not at all  Relationships  . Social Herbalist on phone: Not on file    Gets together: Not on file    Attends religious service: Not on file    Active member of club or organization: Not on file    Attends meetings of clubs or organizations: Not on file    Relationship status: Not on file  . Intimate partner violence    Fear of current or ex partner: No    Emotionally abused: No    Physically abused: No    Forced sexual activity: No  Other Topics Concern  . 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 Depression Brother   . Alcohol abuse Brother   . Suicidality Brother   . Stroke Mother   . Hyperlipidemia Mother   . Hypertension Mother   . Clotting disorder Brother   . Cancer Brother  MDS  . Colon polyps Father   . Melanoma Sister   . Cancer Sister        skin MM  . Stroke Maternal Grandfather   . Melanoma Nephew   . Bipolar disorder Neg Hx     ALLERGIES:  is allergic to other and codeine.  MEDICATIONS:  Current Outpatient Medications  Medication Sig Dispense Refill  . calcium-vitamin D (OSCAL WITH D) 500-200 MG-UNIT tablet Take 2 tablets by mouth 2 (two) times daily.    . cetirizine (ZYRTEC) 10 MG tablet Take 10 mg by mouth daily as needed for allergies.     Marland Kitchen estradiol (ESTRACE) 0.5 MG tablet Take 1 tablet (0.5 mg total) by mouth daily. 90 tablet 3  . lamoTRIgine (LAMICTAL) 200 MG tablet Take 1 tablet (200 mg total) by mouth every morning. 90 tablet 0  . levothyroxine (SYNTHROID) 25 MCG tablet Take 1 tablet (25 mcg total) by mouth daily before breakfast. Will need TSH lab in 6-8 weeks call the office 90 tablet 3  . methylphenidate (RITALIN) 5 MG tablet Take  1 tablet (5 mg total) by mouth every morning. 30 tablet 0  . montelukast (SINGULAIR) 10 MG tablet Take 1 tablet (10 mg total) by mouth at bedtime. 30 tablet 3  . Multiple Vitamins-Minerals (MULTIVITAMIN WITH MINERALS) tablet Take 1 tablet by mouth daily.    Marland Kitchen omeprazole (PRILOSEC) 20 MG capsule Take 1 capsule (20 mg total) by mouth daily before breakfast. 90 capsule 3  . ondansetron (ZOFRAN-ODT) 4 MG disintegrating tablet Take 1 tablet (4 mg total) by mouth 2 (two) times daily as needed for nausea or vomiting. AND PRN if this continues see gastroenterology 90 tablet 1  . traZODone (DESYREL) 50 MG tablet Take 1 tablet (50 mg total) by mouth at bedtime. 90 tablet 0  . TRINTELLIX 20 MG TABS tablet Take 1 tablet (20 mg total) by mouth every morning. 30 tablet 2  . VITAMIN D PO Take by mouth daily.     No current facility-administered medications for this visit.       Marland Kitchen  PHYSICAL EXAMINATION: ECOG PERFORMANCE STATUS: 0 - Asymptomatic Vitals:   04/04/19 1357  BP: 124/76  Pulse: 69  Resp: 18  Temp: (!) 97.2 F (36.2 C)   Filed Weights   04/04/19 1357  Weight: 153 lb 1.6 oz (69.4 kg)   Physical Exam  Constitutional: She is oriented to person, place, and time. No distress.  HENT:  Head: Normocephalic and atraumatic.  Nose: Nose normal.  Mouth/Throat: Oropharynx is clear and moist. No oropharyngeal exudate.  Eyes: Pupils are equal, round, and reactive to light. EOM are normal. No scleral icterus.  Neck: Normal range of motion. Neck supple.  Cardiovascular: Normal rate and regular rhythm.  No murmur heard. Pulmonary/Chest: Effort normal. No respiratory distress. She has no rales. She exhibits no tenderness.  Abdominal: Soft. She exhibits no distension. There is no abdominal tenderness.  Musculoskeletal: Normal range of motion.        General: No edema.  Neurological: She is alert and oriented to person, place, and time. No cranial nerve deficit. She exhibits normal muscle tone.  Coordination normal.  Skin: Skin is warm and dry. She is not diaphoretic. No erythema.  Psychiatric: Affect normal.      LABORATORY DATA:  I have reviewed the data as listed Lab Results  Component Value Date   WBC 8.8 04/04/2019   HGB 12.8 04/04/2019   HCT 39.4 04/04/2019   MCV 94.3 04/04/2019  PLT 230 04/04/2019   Recent Labs    07/13/18 0959 10/10/18 1344 12/27/18 1007 04/04/19 1336  NA 138 139 141 140  K 4.2 3.9 4.5 4.3  CL 103 103 106 106  CO2 29 29 29 28   GLUCOSE 107* 83 84 94  BUN 12 13 12 10   CREATININE 1.17* 1.07 0.99 1.04*  CALCIUM 9.3 9.3 9.5 9.1  GFRNONAA 47*  --  57* 54*  GFRAA 54*  --  >60 >60  PROT 6.9  --  6.7 7.0  ALBUMIN 4.3  --  3.8 3.9  AST 21  --  13* 16  ALT 18  --  15 17  ALKPHOS 72  --  59 63  BILITOT 0.6  --  0.6 0.4    RADIOGRAPHIC STUDIES: I have personally reviewed the radiological images as listed and agreed with the findings in the report. No results found.  ASSESSMENT & PLAN:  1. History of colon cancer   2. Nausea and vomiting, intractability of vomiting not specified, unspecified vomiting type   3. Pelvic pain in female   Cancer Staging Cancer of right colon [facility]) Staging form: Colon and Rectum, AJCC 8th Edition - Clinical: cT1 - Unsigned - Pathologic stage from 03/08/2017: Stage IIA (pT3, pN0, cM0) - Signed by Earlie Server, MD on 03/08/2017  #History of stage IIA right side colon cancer, declined adjuvant chemotherapy. Labs are reviewed and discussed with patient. Stable blood counts.  CEA1.6, stable. Repeat CT scanning in 3 months. After that we will transition to history and physical every 6 months with annual CT scan.  #Nausea.  Chronic problem for her.Etiology unclear.  Patient is on estradiol  0.5 mg daily.  I wonder estrogen replacement is contributing to her nausea.  I will touch base with primary care provider.  #pelvic pain, during intercourse.  Advised patient to discuss with primary care provider and GYN for  evaluation first to rule out GYN etiology.. Patient will repeat CT scan in 3 months.  #History of iron deficiency anemia, hemoglobin has been stable. #CKD,Creatinine stable.  Recommend CBC, CMP, CEA Q3 months.  All questions were answered. The patient knows to call the clinic with any problems questions or concerns. We spent sufficient time to discuss many aspect of care, questions were answered to patient's satisfaction.  Return of visit:  3 months.  Orders Placed This Encounter  Procedures  . CT Chest W Contrast    Standing Status:   Future    Standing Expiration Date:   04/03/2020    Order Specific Question:   ** REASON FOR EXAM (FREE TEXT)    Answer:   colon cancer    Order Specific Question:   If indicated for the ordered procedure, I authorize the administration of contrast media per Radiology protocol    Answer:   Yes    Order Specific Question:   Preferred imaging location?    Answer:   [facility]    Order Specific Question:   Radiology Contrast Protocol - do NOT remove file path    Answer:   \\[email or web address].pdf  . CT Abdomen Pelvis W Contrast    Standing Status:   Future    Standing Expiration Date:   04/03/2020    Order Specific Question:   ** REASON FOR EXAM (FREE TEXT)    Answer:   colon cancer    Order Specific Question:   If indicated for the ordered procedure, I authorize the administration of contrast media per Radiology protocol  Answer:   Yes    Order Specific Question:   Preferred imaging location?    Answer:   [facility]    Order Specific Question:   Is Oral Contrast requested for this exam?    Answer:   Yes, Per Radiology protocol    Order Specific Question:   Radiology Contrast Protocol - do NOT remove file path    Answer:   \\[email or web address].pdf  . CBC with Differential/Platelet    Standing Status:   Future    Standing Expiration Date:   04/03/2020  . Comprehensive metabolic panel    Standing  Status:   Future    Standing Expiration Date:   04/03/2020  . CEA    Standing Status:   Future    Standing Expiration Date:   04/03/2020     Earlie Server, MD, PhD Hematology Oncology [facility] Pager- [phone] 04/05/2019

## 2019-04-11 DIAGNOSIS — L718 Other rosacea: Secondary | ICD-10-CM | POA: Diagnosis not present

## 2019-04-11 DIAGNOSIS — D485 Neoplasm of uncertain behavior of skin: Secondary | ICD-10-CM | POA: Diagnosis not present

## 2019-04-13 ENCOUNTER — Ambulatory Visit
Admission: RE | Admit: 2019-04-13 | Discharge: 2019-04-13 | Disposition: A | Discharge status: Home / Self Care | Payer: Medicare Other | Source: Ambulatory Visit | Attending: Internal Medicine | Admitting: Internal Medicine

## 2019-04-13 DIAGNOSIS — R928 Other abnormal and inconclusive findings on diagnostic imaging of breast: Secondary | ICD-10-CM

## 2019-04-13 DIAGNOSIS — N6310 Unspecified lump in the right breast, unspecified quadrant: Secondary | ICD-10-CM | POA: Diagnosis not present

## 2019-04-13 DIAGNOSIS — N6001 Solitary cyst of right breast: Secondary | ICD-10-CM | POA: Diagnosis not present

## 2019-05-03 ENCOUNTER — Telehealth (HOSPITAL_COMMUNITY): Payer: Self-pay | Admitting: *Deleted

## 2019-05-03 DIAGNOSIS — F900 Attention-deficit hyperactivity disorder, predominantly inattentive type: Secondary | ICD-10-CM

## 2019-05-03 NOTE — Telephone Encounter (Signed)
Pt left message requesting refill on the Ritalin 5mg . [place].

## 2019-05-04 MED ORDER — METHYLPHENIDATE HCL 5 MG PO TABS: 5.0000 mg | ORAL_TABLET | ORAL | 0 refills | Status: DC

## 2019-05-04 NOTE — Telephone Encounter (Signed)
She is requesting one week earlier. I send prescription and she can pick up week later.

## 2019-05-06 ENCOUNTER — Other Ambulatory Visit (HOSPITAL_COMMUNITY): Payer: Self-pay | Admitting: Psychiatry

## 2019-05-06 DIAGNOSIS — F331 Major depressive disorder, recurrent, moderate: Secondary | ICD-10-CM

## 2019-05-12 ENCOUNTER — Encounter (HOSPITAL_COMMUNITY): Payer: Self-pay | Admitting: Psychiatry

## 2019-05-12 ENCOUNTER — Ambulatory Visit (INDEPENDENT_AMBULATORY_CARE_PROVIDER_SITE_OTHER): Payer: Medicare Other | Admitting: Psychiatry

## 2019-05-12 ENCOUNTER — Other Ambulatory Visit: Payer: Self-pay

## 2019-05-12 DIAGNOSIS — F331 Major depressive disorder, recurrent, moderate: Secondary | ICD-10-CM | POA: Diagnosis not present

## 2019-05-12 DIAGNOSIS — F900 Attention-deficit hyperactivity disorder, predominantly inattentive type: Secondary | ICD-10-CM

## 2019-05-12 MED ORDER — TRAZODONE HCL 50 MG PO TABS: 50.0000 mg | ORAL_TABLET | Freq: Every day | ORAL | 0 refills | Status: DC

## 2019-05-12 MED ORDER — LAMOTRIGINE 200 MG PO TABS: 200.0000 mg | ORAL_TABLET | ORAL | 0 refills | Status: DC

## 2019-05-12 MED ORDER — TRINTELLIX 20 MG PO TABS: 20.0000 mg | ORAL_TABLET | ORAL | 2 refills | Status: DC

## 2019-05-12 NOTE — Progress Notes (Signed)
Virtual Visit via Telephone Note  I connected with [name] on 05/12/19 at  9:00 AM EST by telephone and verified that I am speaking with the correct person using two identifiers.   I discussed the limitations, risks, security and privacy concerns of performing an evaluation and management service by telephone and the availability of in person appointments. I also discussed with the patient that there may be a patient responsible charge related to this service. The patient expressed understanding and agreed to proceed.   History of Present Illness: Patient was evaluated by phone session.  She is doing very well since she started low-dose thyroid medicine.  She feel her motivation and energy is up.  Recently her husband has knee surgery and she was able to help him.  She had a good Christmas as she visited her son who lives in [place].  She is sleeping good.  She is taking Trintellix, trazodone, Ritalin and that seems to be working good.  Her attention and focus is good.  She denies any crying spells or any feeling of hopelessness or worthlessness.  She denies any mood swings or irritability.  She has no rash, itching or tremors.  Her appetite is okay and she reported her weight is unchanged from the past.  Recently she had blood work and her CBC and chemistry was normal except creatinine 1.04.     Past Psychiatric History:Reviewed. H/O mania, irritability, impulsive behavior and overdose at age 32. H/O inpatient twice. Had psychological testing and diagnosed ADD. Took Concerta, (Adderall, Vyvansemade anxious)Wellbutrin, Prozac, lithium, Effexor, Lexapro, Abilify and Cymbalta. Best when given female hormone to help endometriosis. Had ECT, did not work. TMS helped. No history of psychosis.   Recent Results (from the past 2160 hour(s))  Lipid panel     Status: Abnormal   Collection Time: 03/14/19  8:50 AM  Result Value Ref Range   Cholesterol 200 0 - 200 mg/dL    Comment: ATP III  Classification       Desirable:  < 200 mg/dL               Borderline High:  200 - 239 mg/dL          High:  > = 240 mg/dL   Triglycerides 102.0 0.0 - 149.0 mg/dL    Comment: Normal:  <150 mg/dLBorderline High:  150 - 199 mg/dL   HDL 67.60 >39.00 mg/dL   VLDL 20.4 0.0 - 40.0 mg/dL   LDL Cholesterol 112 (H) 0 - 99 mg/dL   Total CHOL/HDL Ratio 3     Comment:                Men          Women1/2 Average Risk     3.4          3.3Average Risk          5.0          4.42X Average Risk          9.6          7.13X Average Risk          15.0          11.0                       NonHDL 132.45     Comment: NOTE:  Non-HDL goal should be 30 mg/dL higher than patient's LDL goal (i.e. LDL goal of < 70 mg/dL,  would have non-HDL goal of < 100 mg/dL)  TSH     Status: Abnormal   Collection Time: 03/14/19  8:50 AM  Result Value Ref Range   TSH 4.78 (H) 0.35 - 4.50 uIU/mL  Urinalysis, Routine w reflex microscopic     Status: None   Collection Time: 03/14/19  8:50 AM  Result Value Ref Range   Color, Urine YELLOW YELLOW   APPearance CLEAR CLEAR   Specific Gravity, Urine 1.003 1.001 - 1.03   pH 7.0 5.0 - 8.0   Glucose, UA NEGATIVE NEGATIVE   Bilirubin Urine NEGATIVE NEGATIVE   Ketones, ur NEGATIVE NEGATIVE   Hgb urine dipstick NEGATIVE NEGATIVE   Protein, ur NEGATIVE NEGATIVE   Nitrite NEGATIVE NEGATIVE   Leukocytes,Ua NEGATIVE NEGATIVE  CEA     Status: None   Collection Time: 04/04/19  1:36 PM  Result Value Ref Range   CEA 1.6 0.0 - 4.7 ng/mL    Comment: (NOTE)                             Nonsmokers          <3.9                             Smokers             <5.6 Roche [employer] Electrochemiluminescence Immunoassay (ECLIA) Values obtained with different assay methods or kits cannot be used interchangeably.  Results cannot be interpreted as absolute evidence of the presence or absence of malignant disease. Performed At: [facility] [place], [place] [id number] Rush Farmer MD [id number]   Comprehensive metabolic panel     Status: Abnormal   Collection Time: 04/04/19  1:36 PM  Result Value Ref Range   Sodium 140 135 - 145 mmol/L   Potassium 4.3 3.5 - 5.1 mmol/L   Chloride 106 98 - 111 mmol/L   CO2 28 22 - 32 mmol/L   Glucose, Bld 94 70 - 99 mg/dL   BUN 10 8 - 23 mg/dL   Creatinine, Ser 1.04 (H) 0.44 - 1.00 mg/dL   Calcium 9.1 8.9 - 10.3 mg/dL   Total Protein 7.0 6.5 - 8.1 g/dL   Albumin 3.9 3.5 - 5.0 g/dL   AST 16 15 - 41 U/L   ALT 17 0 - 44 U/L   Alkaline Phosphatase 63 38 - 126 U/L   Total Bilirubin 0.4 0.3 - 1.2 mg/dL   GFR calc non Af Amer 54 (L) >60 mL/min   GFR calc Af Amer >60 >60 mL/min   Anion gap 6 5 - 15    Comment: Performed at [facility], [address] [place], [place] [address]  CBC with Differential/Platelet     Status: Abnormal   Collection Time: 04/04/19  1:36 PM  Result Value Ref Range   WBC 8.8 4.0 - 10.5 K/uL   RBC 4.18 3.87 - 5.11 MIL/uL   Hemoglobin 12.8 12.0 - 15.0 g/dL   HCT 39.4 36.0 - 46.0 %   MCV 94.3 80.0 - 100.0 fL   MCH 30.6 26.0 - 34.0 pg   MCHC 32.5 30.0 - 36.0 g/dL   RDW 12.5 11.5 - 15.5 %   Platelets 230 150 - 400 K/uL   nRBC 0.0 0.0 - 0.2 %   Neutrophils Relative % 47 %   Neutro Abs 4.2 1.7 - 7.7 K/uL  Lymphocytes Relative 46 %   Lymphs Abs 4.1 (H) 0.7 - 4.0 K/uL   Monocytes Relative 5 %   Monocytes Absolute 0.4 0.1 - 1.0 K/uL   Eosinophils Relative 1 %   Eosinophils Absolute 0.1 0.0 - 0.5 K/uL   Basophils Relative 1 %   Basophils Absolute 0.1 0.0 - 0.1 K/uL   Immature Granulocytes 0 %   Abs Immature Granulocytes 0.01 0.00 - 0.07 K/uL    Comment: Performed at [facility], [address], [place], [place] [address]      Psychiatric Specialty Exam: Physical Exam  Review of Systems  There were no vitals taken for this visit.There is no height or weight on file to calculate BMI.  General Appearance: NA  Eye Contact:  NA  Speech:  Clear and Coherent  Volume:  Normal   Mood:  Euthymic  Affect:  NA  Thought Process:  Goal Directed  Orientation:  Full (Time, Place, and Person)  Thought Content:  WDL  Suicidal Thoughts:  No  Homicidal Thoughts:  No  Memory:  Immediate;   Good Recent;   Good Remote;   Good  Judgement:  Good  Insight:  Good  Psychomotor Activity:  NA  Concentration:  Concentration: Fair and Attention Span: Fair  Recall:  Good  Fund of Knowledge:  Good  Language:  Good  Akathisia:  No  Handed:  Right  AIMS (if indicated):     Assets:  Communication Skills Desire for Improvement Housing Physical Health Resilience Social Support  ADL's:  Intact  Cognition:  WNL  Sleep:   fair      Assessment and Plan: Major depressive disorder, recurrent.  Attention deficit disorder, inattentive type.  Patient is a stable on her current medication.  I reviewed blood work results.  She has no tremors, shakes or any EPS.  She is tolerating all her medication without any side effects.  Continue Lamictal 200 mg daily, Trintellix 20 mg in the morning, trazodone 50 mg at bedtime and Ritalin 5 mg in the morning.  Discussed medication side effect specially stimulant abuse, tolerance and withdrawal.  Recommended to call us back if she is any question of any concern.  Follow-up in 3 months.  Follow Up Instructions:    I discussed the assessment and treatment plan with the patient. The patient was provided an opportunity to ask questions and all were answered. The patient agreed with the plan and demonstrated an understanding of the instructions.   The patient was advised to call back or seek an in-person evaluation if the symptoms worsen or if the condition fails to improve as anticipated.  I provided 20 minutes of non-face-to-face time during this encounter.   Kathlee Nations, MD

## 2019-05-31 ENCOUNTER — Encounter: Payer: Self-pay | Admitting: Internal Medicine

## 2019-06-02 ENCOUNTER — Other Ambulatory Visit: Payer: Self-pay

## 2019-06-02 ENCOUNTER — Other Ambulatory Visit (INDEPENDENT_AMBULATORY_CARE_PROVIDER_SITE_OTHER): Payer: Medicare Other

## 2019-06-02 DIAGNOSIS — E039 Hypothyroidism, unspecified: Secondary | ICD-10-CM | POA: Diagnosis not present

## 2019-06-02 LAB — TSH: TSH: 1.87 u[IU]/mL (ref 0.35–4.50)

## 2019-06-02 NOTE — Addendum Note (Signed)
Addended by: Leeanne Rio on: 06/02/2019 02:08 PM   Modules accepted: Orders

## 2019-06-02 NOTE — Addendum Note (Signed)
Addended by: Leeanne Rio on: 06/02/2019 02:16 PM   Modules accepted: Orders

## 2019-06-06 DIAGNOSIS — L814 Other melanin hyperpigmentation: Secondary | ICD-10-CM | POA: Diagnosis not present

## 2019-06-06 DIAGNOSIS — D2261 Melanocytic nevi of right upper limb, including shoulder: Secondary | ICD-10-CM | POA: Diagnosis not present

## 2019-06-06 DIAGNOSIS — D485 Neoplasm of uncertain behavior of skin: Secondary | ICD-10-CM | POA: Diagnosis not present

## 2019-06-06 DIAGNOSIS — D2262 Melanocytic nevi of left upper limb, including shoulder: Secondary | ICD-10-CM | POA: Diagnosis not present

## 2019-06-06 DIAGNOSIS — D2272 Melanocytic nevi of left lower limb, including hip: Secondary | ICD-10-CM | POA: Diagnosis not present

## 2019-06-06 DIAGNOSIS — D225 Melanocytic nevi of trunk: Secondary | ICD-10-CM | POA: Diagnosis not present

## 2019-06-06 DIAGNOSIS — Z85828 Personal history of other malignant neoplasm of skin: Secondary | ICD-10-CM | POA: Diagnosis not present

## 2019-06-06 DIAGNOSIS — C44612 Basal cell carcinoma of skin of right upper limb, including shoulder: Secondary | ICD-10-CM | POA: Diagnosis not present

## 2019-06-07 ENCOUNTER — Telehealth (HOSPITAL_COMMUNITY): Payer: Self-pay | Admitting: *Deleted

## 2019-06-07 DIAGNOSIS — F900 Attention-deficit hyperactivity disorder, predominantly inattentive type: Secondary | ICD-10-CM

## 2019-06-07 MED ORDER — METHYLPHENIDATE HCL 5 MG PO TABS: 5.0000 mg | ORAL_TABLET | ORAL | 0 refills | Status: DC

## 2019-06-07 NOTE — Telephone Encounter (Signed)
Pt called requesting refill of Ritalin 5mg  last filled 05/04/19. Pt has an upcoming appointment on 08/11/19. Please review.

## 2019-06-07 NOTE — Telephone Encounter (Signed)
Done

## 2019-06-08 ENCOUNTER — Other Ambulatory Visit: Payer: Self-pay | Admitting: Internal Medicine

## 2019-06-08 DIAGNOSIS — K219 Gastro-esophageal reflux disease without esophagitis: Secondary | ICD-10-CM

## 2019-06-08 MED ORDER — OMEPRAZOLE 20 MG PO CPDR: 20.0000 mg | DELAYED_RELEASE_CAPSULE | Freq: Every day | ORAL | 3 refills | Status: DC

## 2019-06-13 ENCOUNTER — Ambulatory Visit: Payer: Medicare Other | Admitting: Internal Medicine

## 2019-06-14 ENCOUNTER — Encounter: Payer: Self-pay | Admitting: Internal Medicine

## 2019-06-15 DIAGNOSIS — C44612 Basal cell carcinoma of skin of right upper limb, including shoulder: Secondary | ICD-10-CM | POA: Diagnosis not present

## 2019-06-22 ENCOUNTER — Other Ambulatory Visit: Payer: Self-pay | Admitting: Internal Medicine

## 2019-06-22 DIAGNOSIS — R112 Nausea with vomiting, unspecified: Secondary | ICD-10-CM

## 2019-06-22 MED ORDER — ONDANSETRON 4 MG PO TBDP: 4.0000 mg | ORAL_TABLET | Freq: Two times a day (BID) | ORAL | 1 refills | Status: DC | PRN

## 2019-06-28 ENCOUNTER — Ambulatory Visit
Admission: RE | Admit: 2019-06-28 | Discharge: 2019-06-28 | Disposition: A | Discharge status: Home / Self Care | Payer: Medicare Other | Source: Ambulatory Visit | Attending: Oncology | Admitting: Oncology

## 2019-06-28 ENCOUNTER — Other Ambulatory Visit: Payer: Self-pay

## 2019-06-28 DIAGNOSIS — Z85038 Personal history of other malignant neoplasm of large intestine: Secondary | ICD-10-CM | POA: Insufficient documentation

## 2019-06-28 DIAGNOSIS — C189 Malignant neoplasm of colon, unspecified: Secondary | ICD-10-CM | POA: Diagnosis not present

## 2019-06-28 LAB — POCT I-STAT CREATININE: Creatinine, Ser: 1.1 mg/dL — ABNORMAL HIGH (ref 0.44–1.00)

## 2019-06-28 MED ORDER — IOHEXOL 300 MG/ML  SOLN
100.0000 mL | Freq: Once | INTRAMUSCULAR | Status: AC | PRN
  Administered 2019-06-28: 100 mL via INTRAVENOUS

## 2019-06-30 ENCOUNTER — Encounter: Payer: Self-pay | Admitting: Oncology

## 2019-07-03 ENCOUNTER — Telehealth (HOSPITAL_COMMUNITY): Payer: Self-pay | Admitting: *Deleted

## 2019-07-03 ENCOUNTER — Other Ambulatory Visit: Payer: Self-pay

## 2019-07-03 ENCOUNTER — Inpatient Hospital Stay: Payer: Medicare Other | Attending: Oncology

## 2019-07-03 ENCOUNTER — Inpatient Hospital Stay (HOSPITAL_BASED_OUTPATIENT_CLINIC_OR_DEPARTMENT_OTHER): Payer: Medicare Other | Admitting: Oncology

## 2019-07-03 VITALS — BP 118/74 | HR 67 | Temp 96.2°F | Wt 153.4 lb

## 2019-07-03 DIAGNOSIS — C180 Malignant neoplasm of cecum: Secondary | ICD-10-CM | POA: Insufficient documentation

## 2019-07-03 DIAGNOSIS — R110 Nausea: Secondary | ICD-10-CM | POA: Diagnosis not present

## 2019-07-03 DIAGNOSIS — D509 Iron deficiency anemia, unspecified: Secondary | ICD-10-CM | POA: Insufficient documentation

## 2019-07-03 DIAGNOSIS — Z85038 Personal history of other malignant neoplasm of large intestine: Secondary | ICD-10-CM

## 2019-07-03 DIAGNOSIS — K5730 Diverticulosis of large intestine without perforation or abscess without bleeding: Secondary | ICD-10-CM | POA: Insufficient documentation

## 2019-07-03 DIAGNOSIS — N1831 Chronic kidney disease, stage 3a: Secondary | ICD-10-CM | POA: Insufficient documentation

## 2019-07-03 DIAGNOSIS — F900 Attention-deficit hyperactivity disorder, predominantly inattentive type: Secondary | ICD-10-CM

## 2019-07-03 DIAGNOSIS — K317 Polyp of stomach and duodenum: Secondary | ICD-10-CM | POA: Diagnosis not present

## 2019-07-03 LAB — CBC WITH DIFFERENTIAL/PLATELET
Abs Immature Granulocytes: 0.02 10*3/uL (ref 0.00–0.07)
Basophils Absolute: 0.1 10*3/uL (ref 0.0–0.1)
Basophils Relative: 1 %
Eosinophils Absolute: 0.1 10*3/uL (ref 0.0–0.5)
Eosinophils Relative: 1 %
HCT: 40.1 % (ref 36.0–46.0)
Hemoglobin: 12.9 g/dL (ref 12.0–15.0)
Immature Granulocytes: 0 %
Lymphocytes Relative: 50 %
Lymphs Abs: 3.9 10*3/uL (ref 0.7–4.0)
MCH: 30.3 pg (ref 26.0–34.0)
MCHC: 32.2 g/dL (ref 30.0–36.0)
MCV: 94.1 fL (ref 80.0–100.0)
Monocytes Absolute: 0.4 10*3/uL (ref 0.1–1.0)
Monocytes Relative: 5 %
Neutro Abs: 3.4 10*3/uL (ref 1.7–7.7)
Neutrophils Relative %: 43 %
Platelets: 220 10*3/uL (ref 150–400)
RBC: 4.26 MIL/uL (ref 3.87–5.11)
RDW: 12.6 % (ref 11.5–15.5)
WBC: 7.8 10*3/uL (ref 4.0–10.5)
nRBC: 0 % (ref 0.0–0.2)

## 2019-07-03 LAB — COMPREHENSIVE METABOLIC PANEL
ALT: 13 U/L (ref 0–44)
AST: 16 U/L (ref 15–41)
Albumin: 4 g/dL (ref 3.5–5.0)
Alkaline Phosphatase: 53 U/L (ref 38–126)
Anion gap: 8 (ref 5–15)
BUN: 11 mg/dL (ref 8–23)
CO2: 28 mmol/L (ref 22–32)
Calcium: 9.4 mg/dL (ref 8.9–10.3)
Chloride: 103 mmol/L (ref 98–111)
Creatinine, Ser: 0.99 mg/dL (ref 0.44–1.00)
GFR calc Af Amer: >60 mL/min (ref >60)
GFR calc non Af Amer: 57 mL/min — ABNORMAL LOW (ref >60)
Glucose, Bld: 114 mg/dL — ABNORMAL HIGH (ref 70–99)
Potassium: 4 mmol/L (ref 3.5–5.1)
Sodium: 139 mmol/L (ref 135–145)
Total Bilirubin: 0.4 mg/dL (ref 0.3–1.2)
Total Protein: 6.7 g/dL (ref 6.5–8.1)

## 2019-07-03 MED ORDER — METHYLPHENIDATE HCL 5 MG PO TABS: 5.0000 mg | ORAL_TABLET | ORAL | 0 refills | Status: DC

## 2019-07-03 NOTE — Telephone Encounter (Signed)
Done. She can pick up on 07/10/19.

## 2019-07-03 NOTE — Telephone Encounter (Signed)
Pt called requesting a refill of the Methylphenidate 5mg  qam last filled 06/12/19 with no early refills. Please review. Pt has an appointment on 08/11/19.

## 2019-07-03 NOTE — Progress Notes (Signed)
Hematology/Oncology Follow Up Note [facility] [phone] [phone] Patient Care Team: McLean-Scocuzza, Nino Glow, MD as PCP - General (Internal Medicine) Manya Silvas, MD (Inactive) (Gastroenterology) Bary Castilla, Forest Gleason, MD (General Surgery) Clent Jacks, RN as Registered Nurse  REASON FOR VISIT Follow up for treatment of management of colon cancer and iron deficiency anemia.  HISTORY OF PRESENTING ILLNESS:  [name] is a @ 74 y.o.  female with PMH listed below who was referred to me for evaluation and management of colon cancer.  a colonoscopy done on 12/25/2016 by Dr. Vira Agar. Moves her bowels daily. Patient states she is nausea, states it started at the end of June .She states she was having pain in her left upper quadrant pain was seen in the ER on 12/24/2016 Upper and lower endoscopies dated 12/28/2016 reviewed which showed Cecal mass, biopsy-proven adenocarcinoma. Patient underwent laparoscopically assisted right hemicolectomy with ileotransverse colostomy. Pathology revealed pT3N0 adenocarcinoma, with perineural invasion and lymphvascular invasion  #  Cancer Stage: pT3N0cM0, stage colon cancer. Lymphovascular Invasion: Present  Perineural Invasion: Present   Declined adjuvant chemotherapy. #Repeat colonoscopy 01/17/2018 by Dr. Vira Agar showed diverticulosis in the sigmoid colon, in the descending colon and transverse colon.  Internal hemorrhoids.  No specimen collected.  Interval History Patient presents for follow-up of colon cancer and iron deficiency anemia.  Patient continues to have chronic nausea symptoms.  Otherwise she has been doing well. She takes estradiol supplements and has been following up with gynecologist.    Review of Systems  Constitutional: Negative for chills, fever, malaise/fatigue and weight loss.  HENT: Negative for sore throat.   Eyes: Negative for redness.  Respiratory: Negative for  cough, shortness of breath and wheezing.   Cardiovascular: Negative for chest pain, palpitations and leg swelling.  Gastrointestinal: Positive for nausea. Negative for abdominal pain, blood in stool and vomiting.  Genitourinary: Negative for dysuria.  Musculoskeletal: Negative for myalgias.  Skin: Negative for rash.  Neurological: Negative for dizziness, tingling and tremors.  Endo/Heme/Allergies: Does not bruise/bleed easily.  Psychiatric/Behavioral: Negative for hallucinations.     MEDICAL HISTORY:  Past Medical History:  Diagnosis Date  . Allergy   . Anemia   . Carcinoma ([place])   . Colon cancer ([place]) 01/2017  . Colon polyps   . Diverticulitis    01/2018 abscess hosp. 01/2018  . Endometriosis   . Fibromyalgia   . Gastric polyps   . GERD (gastroesophageal reflux disease)   . Hemorrhoids   . Hypothyroidism   . Iron deficiency anemia 03/08/2017  . Major depressive disorder, recurrent episode, mild (HCC)    Dr. Adele Schilder in [place] h/o ECT x 6 x and hosp x 2 and transcranial magnetic treatment   . Miscarriage    x2  . PONV (postoperative nausea and vomiting)   . Skin cancer    arm, thigh and chest Dr. Evorn Gong   . Sleep apnea    USES CPAP  . UTI (urinary tract infection)     SURGICAL HISTORY: Past Surgical History:  Procedure Laterality Date  . ABDOMINAL HYSTERECTOMY  1990   endometriosis  . BREAST BIOPSY Left 2004   benign  . CATARACT EXTRACTION W/PHACO Right 10/26/2018   Procedure: CATARACT EXTRACTION PHACO AND INTRAOCULAR LENS PLACEMENT ([place]) RIGHT TORIC LENS;  Surgeon: Leandrew Koyanagi, MD;  Location: [place];  Service: Ophthalmology;  Laterality: Right;  sleep apnea  . CATARACT EXTRACTION, BILATERAL Bilateral 05/2012  . CHOLECYSTECTOMY  2001  . COLONOSCOPY WITH PROPOFOL N/A 12/28/2016  Procedure: COLONOSCOPY WITH PROPOFOL;  Surgeon: Manya Silvas, MD;  Location: [facility] ENDOSCOPY;  Service: Endoscopy;  Laterality: N/A;  . COLONOSCOPY WITH PROPOFOL N/A  01/17/2018   Procedure: COLONOSCOPY WITH PROPOFOL;  Surgeon: Manya Silvas, MD;  Location: [facility] ENDOSCOPY;  Service: Endoscopy;  Laterality: N/A;  . ESOPHAGOGASTRODUODENOSCOPY (EGD) WITH PROPOFOL N/A 12/28/2016   Procedure: ESOPHAGOGASTRODUODENOSCOPY (EGD) WITH PROPOFOL;  Surgeon: Manya Silvas, MD;  Location: [facility] ENDOSCOPY;  Service: Endoscopy;  Laterality: N/A;  . EYE SURGERY     lens left eye, cataract right eye   . LAPAROSCOPIC RIGHT COLECTOMY Right 01/21/2017   Procedure: LAPAROSCOPIC ASSISTED RIGHT COLECTOMY;  Surgeon: Robert Bellow, MD;  Location: [facility] ORS;  Service: General;  Laterality: Right;  . TONSILLECTOMY      SOCIAL HISTORY: Social History   Socioeconomic History  . Marital status: Married    Spouse name: Not on file  . Number of children: Not on file  . Years of education: Not on file  . Highest education level: Not on file  Occupational History  . Not on file  Tobacco Use  . Smoking status: Never Smoker  . Smokeless tobacco: Never Used  Substance and Sexual Activity  . Alcohol use: Not Currently    Alcohol/week: 0.0 standard drinks    Comment:    . Drug use: No  . Sexual activity: Yes    Partners: Male    Birth control/protection: None  Other Topics Concern  . 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 Difficulty of Paying Living Expenses: Not hard at all  Food Insecurity: No Food Insecurity  . Worried About Charity fundraiser in the Last Year: Never true  . Ran Out of Food in the Last Year: Never true  Transportation Needs: No Transportation Needs  . Lack of Transportation (Medical): No  . Lack of Transportation (Non-Medical): No  Physical Activity:   . Days of Exercise per Week: Not on file  . Minutes of Exercise per Session: Not on  file  Stress: No Stress Concern Present  . Feeling of Stress : Not at all  Social Connections:   . Frequency of Communication with Friends and Family: Not on file  . Frequency of Social Gatherings with Friends and Family: Not on file  . Attends Religious Services: Not on file  . Active Member of Clubs or Organizations: Not on file  . Attends Archivist Meetings: Not on file  . Marital Status: Not on file  Intimate Partner Violence: Not At Risk  . Fear of Current or Ex-Partner: No  . Emotionally Abused: No  . Physically Abused: No  . Sexually Abused: No    FAMILY HISTORY: Family History  Problem Relation Age of Onset  . Depression Brother   . Alcohol abuse Brother   . Suicidality Brother   . Stroke Mother   . Hyperlipidemia Mother   . Hypertension Mother   . Clotting disorder Brother   . Cancer Brother        MDS  . Colon polyps Father   . Melanoma Sister   . Cancer Sister        skin MM  . Stroke Maternal Grandfather   .  Melanoma Nephew   . Bipolar disorder Neg Hx     ALLERGIES:  is allergic to other and codeine.  MEDICATIONS:  Current Outpatient Medications  Medication Sig Dispense Refill  . calcium-vitamin D (OSCAL WITH D) 500-200 MG-UNIT tablet Take 2 tablets by mouth 2 (two) times daily.    . cetirizine (ZYRTEC) 10 MG tablet Take 10 mg by mouth daily as needed for allergies.     Marland Kitchen estradiol (ESTRACE) 0.5 MG tablet Take 1 tablet (0.5 mg total) by mouth daily. 90 tablet 3  . lamoTRIgine (LAMICTAL) 200 MG tablet Take 1 tablet (200 mg total) by mouth every morning. 90 tablet 0  . levothyroxine (SYNTHROID) 25 MCG tablet Take 1 tablet (25 mcg total) by mouth daily before breakfast. Will need TSH lab in 6-8 weeks call the office 90 tablet 3  . montelukast (SINGULAIR) 10 MG tablet Take 1 tablet (10 mg total) by mouth at bedtime. 30 tablet 3  . Multiple Vitamins-Minerals (MULTIVITAMIN WITH MINERALS) tablet Take 1 tablet by mouth daily.    Marland Kitchen omeprazole (PRILOSEC)  20 MG capsule Take 1 capsule (20 mg total) by mouth daily. 30 minutes before lunch wait 3-4 hours after thyroid pill before taking 90 capsule 3  . ondansetron (ZOFRAN-ODT) 4 MG disintegrating tablet Take 1 tablet (4 mg total) by mouth 2 (two) times daily as needed for nausea or vomiting. AND PRN if this continues see gastroenterology (Patient taking differently: Take 4 mg by mouth daily. AND PRN if this continues see gastroenterology) 90 tablet 1  . traZODone (DESYREL) 50 MG tablet Take 1 tablet (50 mg total) by mouth at bedtime. 90 tablet 0  . TRINTELLIX 20 MG TABS tablet Take 1 tablet (20 mg total) by mouth every morning. 30 tablet 2  . VITAMIN D PO Take by mouth daily.    . methylphenidate (RITALIN) 5 MG tablet Take 1 tablet (5 mg total) by mouth every morning. 30 tablet 0   No current facility-administered medications for this visit.      Marland Kitchen  PHYSICAL EXAMINATION: ECOG PERFORMANCE STATUS: 0 - Asymptomatic Vitals:   07/03/19 1323  BP: 118/74  Pulse: 67  Temp: (!) 96.2 F (35.7 C)   Filed Weights   07/03/19 1323  Weight: 153 lb 6.4 oz (69.6 kg)   Physical Exam  Constitutional: She is oriented to person, place, and time. No distress.  HENT:  Head: Normocephalic and atraumatic.  Mouth/Throat: No oropharyngeal exudate.  Eyes: Pupils are equal, round, and reactive to light. EOM are normal. No scleral icterus.  Cardiovascular: Normal rate and regular rhythm.  No murmur heard. Pulmonary/Chest: Effort normal. No respiratory distress. She has no rales. She exhibits no tenderness.  Abdominal: Soft. She exhibits no distension. There is no abdominal tenderness.  Musculoskeletal:        General: No edema. Normal range of motion.     Cervical back: Normal range of motion and neck supple.  Neurological: She is alert and oriented to person, place, and time.  Skin: Skin is warm and dry. She is not diaphoretic. No erythema.  Psychiatric: Affect normal.      LABORATORY DATA:  I have  reviewed the data as listed Lab Results  Component Value Date   WBC 7.8 07/03/2019   HGB 12.9 07/03/2019   HCT 40.1 07/03/2019   MCV 94.1 07/03/2019   PLT 220 07/03/2019   Recent Labs    12/27/18 1007 12/27/18 1007 04/04/19 1336 06/28/19 1003 07/03/19 1308  NA 141  --  140  --  139  K 4.5  --  4.3  --  4.0  CL 106  --  106  --  103  CO2 29  --  28  --  28  GLUCOSE 84  --  94  --  114*  BUN 12  --  10  --  11  CREATININE 0.99   < > 1.04* 1.10* 0.99  CALCIUM 9.5  --  9.1  --  9.4  GFRNONAA 57*  --  54*  --  57*  GFRAA >60  --  >60  --  >60  PROT 6.7  --  7.0  --  6.7  ALBUMIN 3.8  --  3.9  --  4.0  AST 13*  --  16  --  16  ALT 15  --  17  --  13  ALKPHOS 59  --  63  --  53  BILITOT 0.6  --  0.4  --  0.4   < > = values in this interval not displayed.    RADIOGRAPHIC STUDIES: I have personally reviewed the radiological images as listed and agreed with the findings in the report. CT Chest W Contrast  Result Date: 06/28/2019 CLINICAL DATA:  Restaging colon cancer EXAM: CT CHEST, ABDOMEN, AND PELVIS WITH CONTRAST TECHNIQUE: Multidetector CT imaging of the chest, abdomen and pelvis was performed following the standard protocol during bolus administration of intravenous contrast. CONTRAST:  160mL OMNIPAQUE IOHEXOL 300 MG/ML SOLN, additional oral enteric contrast COMPARISON:  12/20/2018 FINDINGS: CT CHEST FINDINGS Cardiovascular: No significant vascular findings. Normal heart size. No pericardial effusion. Mediastinum/Nodes: No enlarged mediastinal, hilar, or axillary lymph nodes. Thyroid gland, trachea, and esophagus demonstrate no significant findings. Lungs/Pleura: Benign calcified nodule of the left upper lobe. No pleural effusion or pneumothorax. Musculoskeletal: No chest wall mass or suspicious bone lesions identified. CT ABDOMEN PELVIS FINDINGS Hepatobiliary: Stable, benign small fluid attenuation cyst of the left lobe of the liver (series 2, image 59). Hepatic steatosis. Status  post cholecystectomy. No biliary dilatation. Pancreas: Unremarkable. No pancreatic ductal dilatation or surrounding inflammatory changes. Spleen: Normal in size without significant abnormality. Adrenals/Urinary Tract: Adrenal glands are unremarkable. Kidneys are normal, without renal calculi, solid lesion, or hydronephrosis. Bladder is unremarkable. Stomach/Bowel: Multiple redemonstrated gastric polyps (series 2, image 52). Small incidental duodenal diverticulum. Status post right colectomy. Severe descending and sigmoid diverticulosis. No evidence of bowel wall thickening, distention, or inflammatory changes. Vascular/Lymphatic: No significant vascular findings are present. No enlarged abdominal or pelvic lymph nodes. Reproductive: Status post hysterectomy. Other: No abdominal wall hernia or abnormality. No abdominopelvic ascites. Musculoskeletal: No acute or significant osseous findings. IMPRESSION: 1. Status post right hemicolectomy. No evidence of metastatic disease within the chest, abdomen or pelvis. 2. Multiple redemonstrated gastric polyps, previously biopsied as benign. 3. Severe descending and sigmoid diverticulosis. No evidence of acute diverticulitis. 4. Hepatic steatosis. Electronically Signed   By: Eddie Candle M.D.   On: 06/28/2019 14:16   CT Abdomen Pelvis W Contrast  Result Date: 06/28/2019 CLINICAL DATA:  Restaging colon cancer EXAM: CT CHEST, ABDOMEN, AND PELVIS WITH CONTRAST TECHNIQUE: Multidetector CT imaging of the chest, abdomen and pelvis was performed following the standard protocol during bolus administration of intravenous contrast. CONTRAST:  170mL OMNIPAQUE IOHEXOL 300 MG/ML SOLN, additional oral enteric contrast COMPARISON:  12/20/2018 FINDINGS: CT CHEST FINDINGS Cardiovascular: No significant vascular findings. Normal heart size. No pericardial effusion. Mediastinum/Nodes: No enlarged mediastinal, hilar, or axillary lymph nodes. Thyroid gland, trachea, and esophagus demonstrate no  significant findings. Lungs/Pleura: Benign calcified nodule of the left upper lobe. No pleural effusion or pneumothorax. Musculoskeletal: No chest wall mass or suspicious bone lesions identified. CT ABDOMEN PELVIS FINDINGS Hepatobiliary: Stable, benign small fluid attenuation cyst of the left lobe of the liver (series 2, image 59). Hepatic steatosis. Status post cholecystectomy. No biliary dilatation. Pancreas: Unremarkable. No pancreatic ductal dilatation or surrounding inflammatory changes. Spleen: Normal in size without significant abnormality. Adrenals/Urinary Tract: Adrenal glands are unremarkable. Kidneys are normal, without renal calculi, solid lesion, or hydronephrosis. Bladder is unremarkable. Stomach/Bowel: Multiple redemonstrated gastric polyps (series 2, image 52). Small incidental duodenal diverticulum. Status post right colectomy. Severe descending and sigmoid diverticulosis. No evidence of bowel wall thickening, distention, or inflammatory changes. Vascular/Lymphatic: No significant vascular findings are present. No enlarged abdominal or pelvic lymph nodes. Reproductive: Status post hysterectomy. Other: No abdominal wall hernia or abnormality. No abdominopelvic ascites. Musculoskeletal: No acute or significant osseous findings. IMPRESSION: 1. Status post right hemicolectomy. No evidence of metastatic disease within the chest, abdomen or pelvis. 2. Multiple redemonstrated gastric polyps, previously biopsied as benign. 3. Severe descending and sigmoid diverticulosis. No evidence of acute diverticulitis. 4. Hepatic steatosis. Electronically Signed   By: Eddie Candle M.D.   On: 06/28/2019 14:16   US BREAST LTD UNI RIGHT INC AXILLA  Result Date: 04/13/2019 CLINICAL DATA:  74 year old patient presents for annual mammogram and follow-up of a probably benign right breast mass at 7:30 position 10 cm from the nipple. The mass is thought to be a minimally complicated cyst. The patient is asymptomatic. EXAM:  DIGITAL DIAGNOSTIC BILATERAL MAMMOGRAM WITH CAD AND TOMO ULTRASOUND RIGHT BREAST COMPARISON:  Previous exam(s). ACR Breast Density Category b: There are scattered areas of fibroglandular density. FINDINGS: The known mass in the posterior third of the lower outer right breast appears slightly smaller on today's mammogram, compared to priors. No new or suspicious mass is identified in either breast. No suspicious microcalcification or architectural distortion is seen bilaterally. Biopsy clip in the outer left breast. Mammographic images were processed with CAD. Targeted ultrasound is performed, showing a 3 x 3 x 3 mm on Lea complicated cyst at Q000111Q position 10 cm from the nipple. This compared to prior. Previously measured 3 x 4 x 5 mm. IMPRESSION: Interval decrease in size of the probably benign right breast mass at 7:30 position, confirming a benign cyst. No evidence of malignancy in either breast. RECOMMENDATION: Screening mammogram in one year.(Code:SM-B-01Y) I have discussed the findings and recommendations with the patient. If applicable, a reminder letter will be sent to the patient regarding the next appointment. BI-RADS CATEGORY  2: Benign. Electronically Signed   By: Curlene Dolphin M.D.   On: 04/13/2019 10:44   MM DIAG BREAST TOMO BILATERAL  Result Date: 04/13/2019 CLINICAL DATA:  74 year old patient presents for annual mammogram and follow-up of a probably benign right breast mass at 7:30 position 10 cm from the nipple. The mass is thought to be a minimally complicated cyst. The patient is asymptomatic. EXAM: DIGITAL DIAGNOSTIC BILATERAL MAMMOGRAM WITH CAD AND TOMO ULTRASOUND RIGHT BREAST COMPARISON:  Previous exam(s). ACR Breast Density Category b: There are scattered areas of fibroglandular density. FINDINGS: The known mass in the posterior third of the lower outer right breast appears slightly smaller on today's mammogram, compared to priors. No new or suspicious mass is identified in either breast.  No suspicious microcalcification or architectural distortion is seen bilaterally. Biopsy clip in the outer left breast. Mammographic images were processed with CAD. Targeted ultrasound is  performed, showing a 3 x 3 x 3 mm on Lea complicated cyst at Q000111Q position 10 cm from the nipple. This compared to prior. Previously measured 3 x 4 x 5 mm. IMPRESSION: Interval decrease in size of the probably benign right breast mass at 7:30 position, confirming a benign cyst. No evidence of malignancy in either breast. RECOMMENDATION: Screening mammogram in one year.(Code:SM-B-01Y) I have discussed the findings and recommendations with the patient. If applicable, a reminder letter will be sent to the patient regarding the next appointment. BI-RADS CATEGORY  2: Benign. Electronically Signed   By: Curlene Dolphin M.D.   On: 04/13/2019 10:44    ASSESSMENT & PLAN:  1. History of colon cancer   2. Gastric polyp   3. Stage 3a chronic kidney disease   Cancer Staging Cancer of right colon [facility]) Staging form: Colon and Rectum, AJCC 8th Edition - Clinical: cT1 - Unsigned - Pathologic stage from 03/08/2017: Stage IIA (pT3, pN0, cM0) - Signed by Earlie Server, MD on 03/08/2017  #History of stage IIA right side colon cancer, with high risk feasures:  Lymphovascular Invasion: Present  Perineural Invasion: Present  declined adjuvant chemotherapy. Labs are reviewed and discussed with patient. Stable counts. CEA is pending. CT was independently reviewed by me and discussed with patient. No evidence of recurrence.  Gastric polyps. I recommend patient to follow-up in 6 months with repeat CT scan.  After that we will transition to annual CT scans Patient was recommended to follow-up with gastroenterology 2 years after her last colonoscopy.  Her gastroenterology has retired.  I will refer to Dr. Alice Reichert to establish care. Marland Kitchen #Nausea.  Chronic problem for her.Etiology unclear.   #Gastric polyp, EGD showed multiple large gastric  polyps,biopsied and the pathology showed fundic gland polyps.  Recommend patient to follow-up with gastroenterology in discussion for management. .  #CKD,Creatinine stable.  Avoid nephrotoxins. Recommend CBC, CMP, CEA Q3 months.  All questions were answered. The patient knows to call the clinic with any problems questions or concerns. We spent sufficient time to discuss many aspect of care, questions were answered to patient's satisfaction.  Return of visit:  3 months.  Orders Placed This Encounter  Procedures  . CT Chest W Contrast    Standing Status:   Future    Standing Expiration Date:   07/02/2020    Order Specific Question:   ** REASON FOR EXAM (FREE TEXT)    Answer:   history of colon cancer, surveillance    Order Specific Question:   If indicated for the ordered procedure, I authorize the administration of contrast media per Radiology protocol    Answer:   Yes    Order Specific Question:   Preferred imaging location?    Answer:   [facility]    Order Specific Question:   Radiology Contrast Protocol - do NOT remove file path    Answer:   \\[email or web address]  . CT Abdomen Pelvis W Contrast    Standing Status:   Future    Standing Expiration Date:   07/02/2020    Order Specific Question:   ** REASON FOR EXAM (FREE TEXT)    Answer:   history of colon cancer, surveillance    Order Specific Question:   If indicated for the ordered procedure, I authorize the administration of contrast media per Radiology protocol    Answer:   Yes    Order Specific Question:   Preferred imaging location?    Answer:   [facility]  Order Specific Question:   Is Oral Contrast requested for this exam?    Answer:   Yes, Per Radiology protocol    Order Specific Question:   Call Results- Best Contact Number?    Answer:   [phone]    Order Specific Question:   Radiology Contrast Protocol - do NOT remove file path    Answer:    \\[email or web address]  . CBC with Differential/Platelet    Standing Status:   Future    Standing Expiration Date:   07/02/2020  . Comprehensive metabolic panel    Standing Status:   Future    Standing Expiration Date:   07/02/2020  . CEA    Standing Status:   Future    Standing Expiration Date:   07/02/2020  . Ambulatory referral to Gastroenterology    Referral Priority:   Routine    Referral Type:   Consultation    Referral Reason:   Specialty Services Required    Referred to Provider:   Efrain Sella, MD    Number of Visits Requested:   1     Earlie Server, MD, PhD Hematology Oncology [facility] 07/03/2019

## 2019-07-03 NOTE — Progress Notes (Signed)
Patient here for follow up. No concerns voiced.  °

## 2019-07-04 LAB — CEA: CEA: 1.9 ng/mL (ref 0.0–4.7)

## 2019-07-11 ENCOUNTER — Other Ambulatory Visit: Payer: Self-pay

## 2019-07-11 ENCOUNTER — Telehealth (INDEPENDENT_AMBULATORY_CARE_PROVIDER_SITE_OTHER): Payer: Medicare Other | Admitting: Internal Medicine

## 2019-07-11 VITALS — Ht 62.0 in | Wt 150.0 lb

## 2019-07-11 DIAGNOSIS — R5383 Other fatigue: Secondary | ICD-10-CM | POA: Diagnosis not present

## 2019-07-11 DIAGNOSIS — E039 Hypothyroidism, unspecified: Secondary | ICD-10-CM

## 2019-07-11 DIAGNOSIS — C182 Malignant neoplasm of ascending colon: Secondary | ICD-10-CM

## 2019-07-11 DIAGNOSIS — Z9989 Dependence on other enabling machines and devices: Secondary | ICD-10-CM | POA: Diagnosis not present

## 2019-07-11 DIAGNOSIS — E785 Hyperlipidemia, unspecified: Secondary | ICD-10-CM

## 2019-07-11 DIAGNOSIS — G4733 Obstructive sleep apnea (adult) (pediatric): Secondary | ICD-10-CM | POA: Diagnosis not present

## 2019-07-11 DIAGNOSIS — Z85828 Personal history of other malignant neoplasm of skin: Secondary | ICD-10-CM | POA: Diagnosis not present

## 2019-07-11 NOTE — Progress Notes (Signed)
Virtual Visit via Video Note  I connected with [name]  on 07/11/19 at  4:20 PM EST by a video enabled telemedicine application and verified that I am speaking with the correct person using two identifiers.  Location patient: home Location provider:work or home office Persons participating in the virtual visit: patient, provider  I discussed the limitations of evaluation and management by telemedicine and the availability of in person appointments. The patient expressed understanding and agreed to proceed.   HPI: 1. C/o daytime fatigue on ritalin 5 mg qd will CC Dr. Osvaldo Angst to see if will increase Ritalin for pt  She also reports napping during the day  OSA on cpap x 10 years  Dr. Dossie Der states will address at f/u  2. H/o colon cancer appt GI Dr. Alice Reichert 07/17/19  3. hypothyroidism TSH 1.87 06/02/19 on levo 25 mcg    ROS: See pertinent positives and negatives per HPI.  Past Medical History:  Diagnosis Date  . Allergy   . Anemia   . Carcinoma ([place])   . Colon cancer ([place]) 01/2017  . Colon polyps   . Diverticulitis    01/2018 abscess hosp. 01/2018  . Endometriosis   . Fibromyalgia   . Gastric polyps   . GERD (gastroesophageal reflux disease)   . Hemorrhoids   . Hypothyroidism   . Iron deficiency anemia 03/08/2017  . Major depressive disorder, recurrent episode, mild (HCC)    Dr. Adele Schilder in [place] h/o ECT x 6 x and hosp x 2 and transcranial magnetic treatment   . Miscarriage    x2  . PONV (postoperative nausea and vomiting)   . Skin cancer    arm, thigh and chest Dr. Evorn Gong   . Sleep apnea    USES CPAP  . UTI (urinary tract infection)     Past Surgical History:  Procedure Laterality Date  . ABDOMINAL HYSTERECTOMY  1990   endometriosis  . BREAST BIOPSY Left 2004   benign  . CATARACT EXTRACTION W/PHACO Right 10/26/2018   Procedure: CATARACT EXTRACTION PHACO AND INTRAOCULAR LENS PLACEMENT ([place]) RIGHT TORIC LENS;  Surgeon: Leandrew Koyanagi, MD;  Location: [facility];  Service: Ophthalmology;  Laterality: Right;  sleep apnea  . CATARACT EXTRACTION, BILATERAL Bilateral 05/2012  . CHOLECYSTECTOMY  2001  . COLONOSCOPY WITH PROPOFOL N/A 12/28/2016   Procedure: COLONOSCOPY WITH PROPOFOL;  Surgeon: Manya Silvas, MD;  Location: [facility] ENDOSCOPY;  Service: Endoscopy;  Laterality: N/A;  . COLONOSCOPY WITH PROPOFOL N/A 01/17/2018   Procedure: COLONOSCOPY WITH PROPOFOL;  Surgeon: Manya Silvas, MD;  Location: [facility] ENDOSCOPY;  Service: Endoscopy;  Laterality: N/A;  . ESOPHAGOGASTRODUODENOSCOPY (EGD) WITH PROPOFOL N/A 12/28/2016   Procedure: ESOPHAGOGASTRODUODENOSCOPY (EGD) WITH PROPOFOL;  Surgeon: Manya Silvas, MD;  Location: [facility] ENDOSCOPY;  Service: Endoscopy;  Laterality: N/A;  . EYE SURGERY     lens left eye, cataract right eye   . LAPAROSCOPIC RIGHT COLECTOMY Right 01/21/2017   Procedure: LAPAROSCOPIC ASSISTED RIGHT COLECTOMY;  Surgeon: Robert Bellow, MD;  Location: [facility] ORS;  Service: General;  Laterality: Right;  . TONSILLECTOMY      Family History  Problem Relation Age of Onset  . Depression Brother   . Alcohol abuse Brother   . Suicidality Brother   . Stroke Mother   . Hyperlipidemia Mother   . Hypertension Mother   . Clotting disorder Brother   . Cancer Brother        MDS  . Colon polyps Father   . Melanoma Sister   .  Cancer Sister        skin MM  . Stroke Maternal Grandfather   . Melanoma Nephew   . Bipolar disorder Neg Hx     SOCIAL HX: married lives at home with husband   Current Outpatient Medications:  .  calcium-vitamin D (OSCAL WITH D) 500-200 MG-UNIT tablet, Take 2 tablets by mouth 2 (two) times daily., Disp: , Rfl:  .  cetirizine (ZYRTEC) 10 MG tablet, Take 10 mg by mouth daily as needed for allergies. , Disp: , Rfl:  .  estradiol (ESTRACE) 0.5 MG tablet, Take 1 tablet (0.5 mg total) by mouth daily., Disp: 90 tablet, Rfl: 3 .  lamoTRIgine (LAMICTAL) 200 MG tablet, Take 1 tablet (200 mg total) by mouth  every morning., Disp: 90 tablet, Rfl: 0 .  levothyroxine (SYNTHROID) 25 MCG tablet, Take 1 tablet (25 mcg total) by mouth daily before breakfast. Will need TSH lab in 6-8 weeks call the office, Disp: 90 tablet, Rfl: 3 .  methylphenidate (RITALIN) 5 MG tablet, Take 1 tablet (5 mg total) by mouth every morning., Disp: 30 tablet, Rfl: 0 .  montelukast (SINGULAIR) 10 MG tablet, Take 1 tablet (10 mg total) by mouth at bedtime., Disp: 30 tablet, Rfl: 3 .  Multiple Vitamins-Minerals (MULTIVITAMIN WITH MINERALS) tablet, Take 1 tablet by mouth daily., Disp: , Rfl:  .  omeprazole (PRILOSEC) 20 MG capsule, Take 1 capsule (20 mg total) by mouth daily. 30 minutes before lunch wait 3-4 hours after thyroid pill before taking, Disp: 90 capsule, Rfl: 3 .  ondansetron (ZOFRAN-ODT) 4 MG disintegrating tablet, Take 1 tablet (4 mg total) by mouth 2 (two) times daily as needed for nausea or vomiting. AND PRN if this continues see gastroenterology (Patient taking differently: Take 4 mg by mouth daily. AND PRN if this continues see gastroenterology), Disp: 90 tablet, Rfl: 1 .  traZODone (DESYREL) 50 MG tablet, Take 1 tablet (50 mg total) by mouth at bedtime., Disp: 90 tablet, Rfl: 0 .  TRINTELLIX 20 MG TABS tablet, Take 1 tablet (20 mg total) by mouth every morning., Disp: 30 tablet, Rfl: 2 .  VITAMIN D PO, Take by mouth daily., Disp: , Rfl:   EXAM:  VITALS per patient if applicable:  GENERAL: alert, oriented, appears well and in no acute distress  HEENT: atraumatic, conjunttiva clear, no obvious abnormalities on inspection of external nose and ears  NECK: normal movements of the head and neck  LUNGS: on inspection no signs of respiratory distress, breathing rate appears normal, no obvious gross SOB, gasping or wheezing  CV: no obvious cyanosis  MS: moves all visible extremities without noticeable abnormality  PSYCH/NEURO: pleasant and cooperative, no obvious depression or anxiety, speech and thought processing  grossly intact  ASSESSMENT AND PLAN:  Discussed the following assessment and plan:  Fatigue, unspecified type See Dr Dossie Der see HPI  Consider increase ritalin he will address at f/u   OSA on CPAP Consider repeat home sleep study given fatigue  History of skin cancer Get records Dr. Kellie Moor  Cancer of right colon [facility]) f/u [facility] 07/17/19   Hypothyroidism, unspecified type Cong meds   HM Flu shot per pt had 01/2019 week of 28th at [facility]  Tdap 10/15/15and 08/10/18 utd prevnar pna 231/28/20  03/2013 zostervax 2/2 shingrix   hep C negative covid 2/2 had   mammo 10/11/18 rec Dx mammo and [name] of right breast in 6 monthsand screening mammogram -->needs to call to [facility] not yet but will do rec she do  04/12/2019  S/p hysterectomy out of age window pap -pap 10/15/14 negative neg HPV  Colonoscopy 01/17/18 diverticulosis, [facility] Dr. Tiffany Kocher repeat in 2 years h/o colon cancer -per pt needs smallest scope and not sure if wants f/u 01/18/20 consider CT entero in the future  07/17/19 GI appt  DEXA 12/21/17 osteopenia   Light smoker age 71 y.o   Dermatology Dr. Inez Catalina office Q6 months h/o nmsc and mohs 06/2019 and 07/2019 Dr. Natale Lay   Former PCP Dr. Clayborn Bigness requested records today including carotid US from 01/2018 in system w/o result. Pt to call/send letter to get results faxed to [name]  -we discussed possible serious and likely etiologies, options for evaluation and workup, limitations of telemedicine visit vs in person visit, treatment, treatment risks and precautions. Pt prefers to treat via telemedicine empirically rather then risking or undertaking an in person visit at this moment. Patient agrees to seek prompt in person care if worsening, new symptoms arise, or if is not improving with treatment.   I discussed the assessment and treatment plan with the patient. The patient was provided an opportunity to ask questions and all were answered. The patient agreed with the  plan and demonstrated an understanding of the instructions.   The patient was advised to call back or seek an in-person evaluation if the symptoms worsen or if the condition fails to improve as anticipated.  Time spent 20 minutes  [name] Jackson, MD

## 2019-07-18 DIAGNOSIS — D131 Benign neoplasm of stomach: Secondary | ICD-10-CM | POA: Diagnosis not present

## 2019-07-18 DIAGNOSIS — R110 Nausea: Secondary | ICD-10-CM | POA: Diagnosis not present

## 2019-07-18 DIAGNOSIS — K219 Gastro-esophageal reflux disease without esophagitis: Secondary | ICD-10-CM | POA: Diagnosis not present

## 2019-07-18 DIAGNOSIS — K5710 Diverticulosis of small intestine without perforation or abscess without bleeding: Secondary | ICD-10-CM | POA: Diagnosis not present

## 2019-07-18 DIAGNOSIS — R140 Abdominal distension (gaseous): Secondary | ICD-10-CM | POA: Diagnosis not present

## 2019-07-18 DIAGNOSIS — Z85038 Personal history of other malignant neoplasm of large intestine: Secondary | ICD-10-CM | POA: Diagnosis not present

## 2019-07-18 DIAGNOSIS — R195 Other fecal abnormalities: Secondary | ICD-10-CM | POA: Diagnosis not present

## 2019-07-25 DIAGNOSIS — L578 Other skin changes due to chronic exposure to nonionizing radiation: Secondary | ICD-10-CM | POA: Diagnosis not present

## 2019-07-25 DIAGNOSIS — L814 Other melanin hyperpigmentation: Secondary | ICD-10-CM | POA: Diagnosis not present

## 2019-07-25 DIAGNOSIS — C44311 Basal cell carcinoma of skin of nose: Secondary | ICD-10-CM | POA: Diagnosis not present

## 2019-07-25 DIAGNOSIS — L988 Other specified disorders of the skin and subcutaneous tissue: Secondary | ICD-10-CM | POA: Diagnosis not present

## 2019-07-26 DIAGNOSIS — R110 Nausea: Secondary | ICD-10-CM | POA: Diagnosis not present

## 2019-07-26 DIAGNOSIS — R140 Abdominal distension (gaseous): Secondary | ICD-10-CM | POA: Diagnosis not present

## 2019-08-03 ENCOUNTER — Telehealth: Payer: Self-pay | Admitting: Internal Medicine

## 2019-08-03 ENCOUNTER — Encounter: Payer: Self-pay | Admitting: Internal Medicine

## 2019-08-03 NOTE — Telephone Encounter (Signed)
Lm with husband to have patient call and schedule follow up in September and fasting labs a few days before appointment.

## 2019-08-03 NOTE — Telephone Encounter (Signed)
Get records derm Dr. Kellie Moor all pathology   Thanks [place]

## 2019-08-08 ENCOUNTER — Telehealth (HOSPITAL_COMMUNITY): Payer: Self-pay | Admitting: *Deleted

## 2019-08-08 NOTE — Telephone Encounter (Signed)
Request sent 

## 2019-08-08 NOTE — Telephone Encounter (Signed)
Pt scheduled lab appt 01/09/20 and ov appt 01/12/20.

## 2019-08-08 NOTE — Telephone Encounter (Signed)
FYI pt called requesting refill of Ritalin 5mg  last written 07/03/19. note to pharmacy states 'do not refill until 07/10/19. Pt has an appointment on 07/11/19.

## 2019-08-11 ENCOUNTER — Other Ambulatory Visit: Payer: Self-pay

## 2019-08-11 ENCOUNTER — Encounter (HOSPITAL_COMMUNITY): Payer: Self-pay | Admitting: Psychiatry

## 2019-08-11 ENCOUNTER — Other Ambulatory Visit (HOSPITAL_COMMUNITY): Payer: Self-pay | Admitting: Psychiatry

## 2019-08-11 ENCOUNTER — Ambulatory Visit (INDEPENDENT_AMBULATORY_CARE_PROVIDER_SITE_OTHER): Payer: Medicare Other | Admitting: Psychiatry

## 2019-08-11 DIAGNOSIS — F331 Major depressive disorder, recurrent, moderate: Secondary | ICD-10-CM | POA: Diagnosis not present

## 2019-08-11 DIAGNOSIS — F900 Attention-deficit hyperactivity disorder, predominantly inattentive type: Secondary | ICD-10-CM | POA: Diagnosis not present

## 2019-08-11 DIAGNOSIS — H43813 Vitreous degeneration, bilateral: Secondary | ICD-10-CM | POA: Diagnosis not present

## 2019-08-11 MED ORDER — LAMOTRIGINE 200 MG PO TABS: 200.0000 mg | ORAL_TABLET | ORAL | 0 refills | Status: DC

## 2019-08-11 MED ORDER — METHYLPHENIDATE HCL 5 MG PO TABS: ORAL_TABLET | ORAL | 0 refills | Status: DC

## 2019-08-11 MED ORDER — TRINTELLIX 20 MG PO TABS: 20.0000 mg | ORAL_TABLET | ORAL | 2 refills | Status: DC

## 2019-08-11 MED ORDER — TRAZODONE HCL 50 MG PO TABS: 50.0000 mg | ORAL_TABLET | Freq: Every day | ORAL | 0 refills | Status: DC

## 2019-08-11 NOTE — Progress Notes (Signed)
Virtual Visit via Telephone Note  I connected with [name] on 08/11/19 at 10:00 AM EDT by telephone and verified that I am speaking with the correct person using two identifiers.   I discussed the limitations, risks, security and privacy concerns of performing an evaluation and management service by telephone and the availability of in person appointments. I also discussed with the patient that there may be a patient responsible charge related to this service. The patient expressed understanding and agreed to proceed.   History of Present Illness: Patient was evaluated by phone session.  She has been feeling lately more tired, no energy and no motivation.  She told that she had a trip to [place] to attend wedding and she is not sure if she got sick after the visit because lately she has been not as motivated.  She is sleeping good.  She denies any crying spells.  She admitted weight gain because she is not active as much.  She wants to try if she can take some days extra Ritalin to help her energy.  She denies any suicidal thoughts or homicidal thought.  Patient is very cautious about stimulant.  She does not take over than prescribed and does not ask early refills.  She has no tremors, shakes or any EPS.  She has no rash from the Lamictal.  She is pleased that her husband is doing well.  Patient admitted that she had a good time in [place] and she was able to see a lot of family members.   Past Psychiatric History:Reviewed. H/Omania, irritability, impulsive behavior and overdose at age 41.H/O inpatient twice. Hadpsychological testingand diagnosed ADD.Took Concerta, (Adderall, Vyvansemadeanxious)Wellbutrin, Prozac, lithium, Effexor, Lexapro, Abilify and Cymbalta. Best whengiven female hormone to help endometriosis. Had ECT, did not work. TMS helped.No history of psychosis.    Psychiatric Specialty Exam: Physical Exam  Review of Systems  There were no vitals taken for this  visit.There is no height or weight on file to calculate BMI.  General Appearance: NA  Eye Contact:  NA  Speech:  Clear and Coherent  Volume:  Normal  Mood:  Dysphoric and tired  Affect:  NA  Thought Process:  Goal Directed  Orientation:  Full (Time, Place, and Person)  Thought Content:  Logical  Suicidal Thoughts:  No  Homicidal Thoughts:  No  Memory:  Immediate;   Good Recent;   Good Remote;   Good  Judgement:  Good  Insight:  Good  Psychomotor Activity:  NA  Concentration:  Concentration: Fair and Attention Span: Fair  Recall:  Good  Fund of Knowledge:  NA  Language:  Good  Akathisia:  No  Handed:  Right  AIMS (if indicated):     Assets:  Communication Skills Desire for Improvement Resilience Social Support  ADL's:  Intact  Cognition:  WNL  Sleep:   ok      Assessment and Plan: Major depressive disorder, recurrent.  ADD, inattentive type.  Patient lately feeling more tired, unmotivated and fatigued.  I explained it could be due to traveling as patient is not used to doing Covid and now she had to travel to [place].  I agree that she can try extra 5 mg of Ritalin some days to see if that helps her motivation and depression.  Patient is aware that if it did not help her then she will stay on 5 mg daily only.  I recommend to call us back if she is any question.  We discussed stimulant abuse,  tolerance, dependency and withdrawal.  Continue Lamictal 200 mg daily, Trintellix 20 mg in the morning and trazodone 50 mg at bedtime.  Follow-up in 3 months.  Follow Up Instructions:    I discussed the assessment and treatment plan with the patient. The patient was provided an opportunity to ask questions and all were answered. The patient agreed with the plan and demonstrated an understanding of the instructions.   The patient was advised to call back or seek an in-person evaluation if the symptoms worsen or if the condition fails to improve as anticipated.  I provided 20 minutes  of non-face-to-face time during this encounter.   Kathlee Nations, MD

## 2019-08-14 NOTE — Telephone Encounter (Signed)
Spoke with Larena Glassman at [facility] dermatology. She states they will re-fax these results. She apologizes as their fax machine has been having problems.

## 2019-09-04 NOTE — Telephone Encounter (Signed)
Did you receive these records?

## 2019-09-04 NOTE — Telephone Encounter (Signed)
No   Thanks [place]

## 2019-09-04 NOTE — Telephone Encounter (Signed)
Left message requesting records with all of our contact information and patient's information

## 2019-09-08 ENCOUNTER — Telehealth (HOSPITAL_COMMUNITY): Payer: Self-pay | Admitting: *Deleted

## 2019-09-08 DIAGNOSIS — F900 Attention-deficit hyperactivity disorder, predominantly inattentive type: Secondary | ICD-10-CM

## 2019-09-08 MED ORDER — METHYLPHENIDATE HCL 5 MG PO TABS: ORAL_TABLET | ORAL | 0 refills | Status: DC

## 2019-09-08 NOTE — Telephone Encounter (Signed)
Done

## 2019-09-08 NOTE — Telephone Encounter (Signed)
Pt called requesting refill of the Ritalin 5mg  last ordered on 08/11/19. Pt has an upcoming on 11/09/19. Please review.

## 2019-10-06 NOTE — Telephone Encounter (Signed)
Yes thank you  [place]

## 2019-10-06 NOTE — Telephone Encounter (Signed)
Have you received these records?

## 2019-10-11 ENCOUNTER — Telehealth (HOSPITAL_COMMUNITY): Payer: Self-pay | Admitting: *Deleted

## 2019-10-11 DIAGNOSIS — F900 Attention-deficit hyperactivity disorder, predominantly inattentive type: Secondary | ICD-10-CM

## 2019-10-11 NOTE — Telephone Encounter (Signed)
Pt called requesting refill of the Ritalin 5mg  last ordered 09/08/19. Pt has an upcoming appointment on 11/09/19. Please review.

## 2019-10-12 MED ORDER — METHYLPHENIDATE HCL 5 MG PO TABS: ORAL_TABLET | ORAL | 0 refills | Status: DC

## 2019-10-12 NOTE — Telephone Encounter (Signed)
Done

## 2019-11-05 ENCOUNTER — Other Ambulatory Visit (HOSPITAL_COMMUNITY): Payer: Self-pay | Admitting: Psychiatry

## 2019-11-05 DIAGNOSIS — F331 Major depressive disorder, recurrent, moderate: Secondary | ICD-10-CM

## 2019-11-09 ENCOUNTER — Encounter (HOSPITAL_COMMUNITY): Payer: Self-pay | Admitting: Psychiatry

## 2019-11-09 ENCOUNTER — Other Ambulatory Visit: Payer: Self-pay

## 2019-11-09 ENCOUNTER — Telehealth (INDEPENDENT_AMBULATORY_CARE_PROVIDER_SITE_OTHER): Payer: Medicare Other | Admitting: Psychiatry

## 2019-11-09 DIAGNOSIS — F331 Major depressive disorder, recurrent, moderate: Secondary | ICD-10-CM

## 2019-11-09 DIAGNOSIS — F900 Attention-deficit hyperactivity disorder, predominantly inattentive type: Secondary | ICD-10-CM | POA: Diagnosis not present

## 2019-11-09 MED ORDER — TRAZODONE HCL 50 MG PO TABS: 50.0000 mg | ORAL_TABLET | Freq: Every day | ORAL | 0 refills | Status: DC

## 2019-11-09 MED ORDER — METHYLPHENIDATE HCL 5 MG PO TABS: ORAL_TABLET | ORAL | 0 refills | Status: DC

## 2019-11-09 MED ORDER — LAMOTRIGINE 200 MG PO TABS: 200.0000 mg | ORAL_TABLET | ORAL | 0 refills | Status: DC

## 2019-11-09 MED ORDER — TRINTELLIX 20 MG PO TABS: 20.0000 mg | ORAL_TABLET | ORAL | 2 refills | Status: DC

## 2019-11-09 NOTE — Progress Notes (Signed)
Virtual Visit via Telephone Note  I connected with [name] on 11/09/19 at  9:00 AM EDT by telephone and verified that I am speaking with the correct person using two identifiers.  Location: Patient: home Provider: home office   I discussed the limitations, risks, security and privacy concerns of performing an evaluation and management service by telephone and the availability of in person appointments. I also discussed with the patient that there may be a patient responsible charge related to this service. The patient expressed understanding and agreed to proceed.   History of Present Illness: Patient is evaluated by phone session.  She is doing well on her medication.  She is taking the Ritalin only 1 a day as she realizes her tiredness could be due to recent trip to the [place].  She is sleeping good.  Her husband recently had left knee surgery who is recovering much better.  Patient told he may require knee surgery on his right side.  She feels her attention concentration is good.  Her ADD is under control.  She denies any suicidal thoughts.  She has no rash, itching tremors or shakes from Lamictal.  She is sleeping good with the help of trazodone.  She denies crying spells or any feeling of hopelessness.  She like to keep the current medication.   Past Psychiatric History:Reviewed. H/Omania, irritability, impulsive behavior and overdose at age 4.H/O inpatient twice. Hadpsychological testingand diagnosed ADD.Took Concerta, (Adderall, Vyvansemadeanxious)Wellbutrin, Prozac, lithium, Effexor, Lexapro, Abilify and Cymbalta. Best whengiven female hormone to help endometriosis. Had ECT, did not work. TMS helped.No history of psychosis.  Psychiatric Specialty Exam: Physical Exam  Review of Systems  There were no vitals taken for this visit.There is no height or weight on file to calculate BMI.  General Appearance: NA  Eye Contact:  NA  Speech:  Clear and Coherent and  Normal Rate  Volume:  Normal  Mood:  Euthymic  Affect:  NA  Thought Process:  Coherent  Orientation:  Full (Time, Place, and Person)  Thought Content:  Logical  Suicidal Thoughts:  No  Homicidal Thoughts:  No  Memory:  Immediate;   Good Recent;   Good Remote;   Good  Judgement:  Good  Insight:  Good  Psychomotor Activity:  NA  Concentration:  Concentration: Good and Attention Span: Good  Recall:  Good  Fund of Knowledge:  Good  Language:  Good  Akathisia:  No  Handed:  Right  AIMS (if indicated):     Assets:  Communication Skills Desire for Improvement Resilience Social Support  ADL's:  Intact  Cognition:  WNL  Sleep:   good       Assessment and Plan: Major depressive disorder, recurrent.  ADD, inattentive type.  Patient is back to Ritalin 5 mg 1 tablet daily.  Her energy level is improved.  She is not taking second Ritalin.  Discussed medication side effects and benefits.  Continue Trintellix 20 mg in the morning, Lamictal 200 mg daily, Ritalin 5 mg in the morning and trazodone 50 mg at bedtime.  Recommended to call us back if she has any question, concern or if she feels worsening of the symptom.  Follow-up in 3 months.  Follow Up Instructions:    I discussed the assessment and treatment plan with the patient. The patient was provided an opportunity to ask questions and all were answered. The patient agreed with the plan and demonstrated an understanding of the instructions.   The patient was advised to call  back or seek an in-person evaluation if the symptoms worsen or if the condition fails to improve as anticipated.  I provided 15 minutes of non-face-to-face time during this encounter.   Kathlee Nations, MD

## 2019-12-15 ENCOUNTER — Telehealth (HOSPITAL_COMMUNITY): Payer: Self-pay | Admitting: *Deleted

## 2019-12-15 DIAGNOSIS — F900 Attention-deficit hyperactivity disorder, predominantly inattentive type: Secondary | ICD-10-CM

## 2019-12-15 MED ORDER — METHYLPHENIDATE HCL 5 MG PO TABS: ORAL_TABLET | ORAL | 0 refills | Status: DC

## 2019-12-15 NOTE — Telephone Encounter (Signed)
Done

## 2019-12-15 NOTE — Telephone Encounter (Signed)
Pt called requesting refill of the Ritalin 5mg  last ordered on 11/09/19. Pt has an appointment on 02/09/20.

## 2019-12-19 DIAGNOSIS — L570 Actinic keratosis: Secondary | ICD-10-CM | POA: Diagnosis not present

## 2019-12-22 ENCOUNTER — Other Ambulatory Visit: Payer: Self-pay | Admitting: Internal Medicine

## 2019-12-22 DIAGNOSIS — R112 Nausea with vomiting, unspecified: Secondary | ICD-10-CM

## 2019-12-22 MED ORDER — ONDANSETRON 4 MG PO TBDP: 4.0000 mg | ORAL_TABLET | Freq: Two times a day (BID) | ORAL | 1 refills | Status: DC | PRN

## 2020-01-03 ENCOUNTER — Other Ambulatory Visit: Payer: Self-pay

## 2020-01-03 ENCOUNTER — Ambulatory Visit
Admission: RE | Admit: 2020-01-03 | Discharge: 2020-01-03 | Disposition: A | Discharge status: Home / Self Care | Payer: Medicare Other | Source: Ambulatory Visit | Attending: Oncology | Admitting: Oncology

## 2020-01-03 DIAGNOSIS — Z85038 Personal history of other malignant neoplasm of large intestine: Secondary | ICD-10-CM | POA: Insufficient documentation

## 2020-01-03 DIAGNOSIS — K3189 Other diseases of stomach and duodenum: Secondary | ICD-10-CM | POA: Diagnosis not present

## 2020-01-03 DIAGNOSIS — I700 Atherosclerosis of aorta: Secondary | ICD-10-CM | POA: Diagnosis not present

## 2020-01-03 DIAGNOSIS — K317 Polyp of stomach and duodenum: Secondary | ICD-10-CM | POA: Diagnosis not present

## 2020-01-03 DIAGNOSIS — C189 Malignant neoplasm of colon, unspecified: Secondary | ICD-10-CM | POA: Diagnosis not present

## 2020-01-03 LAB — POCT I-STAT CREATININE: Creatinine, Ser: 1.1 mg/dL — ABNORMAL HIGH (ref 0.44–1.00)

## 2020-01-03 MED ORDER — IOHEXOL 300 MG/ML  SOLN
100.0000 mL | Freq: Once | INTRAMUSCULAR | Status: AC | PRN
  Administered 2020-01-03: 100 mL via INTRAVENOUS

## 2020-01-05 ENCOUNTER — Encounter: Payer: Self-pay | Admitting: Oncology

## 2020-01-05 ENCOUNTER — Other Ambulatory Visit: Payer: Self-pay

## 2020-01-05 ENCOUNTER — Inpatient Hospital Stay (HOSPITAL_BASED_OUTPATIENT_CLINIC_OR_DEPARTMENT_OTHER): Payer: Medicare Other | Admitting: Oncology

## 2020-01-05 ENCOUNTER — Inpatient Hospital Stay: Payer: Medicare Other | Attending: Oncology

## 2020-01-05 VITALS — BP 117/70 | HR 67 | Temp 97.9°F | Resp 18 | Wt 157.6 lb

## 2020-01-05 DIAGNOSIS — D509 Iron deficiency anemia, unspecified: Secondary | ICD-10-CM | POA: Insufficient documentation

## 2020-01-05 DIAGNOSIS — K317 Polyp of stomach and duodenum: Secondary | ICD-10-CM

## 2020-01-05 DIAGNOSIS — R9389 Abnormal findings on diagnostic imaging of other specified body structures: Secondary | ICD-10-CM

## 2020-01-05 DIAGNOSIS — Z85038 Personal history of other malignant neoplasm of large intestine: Secondary | ICD-10-CM | POA: Diagnosis not present

## 2020-01-05 DIAGNOSIS — R110 Nausea: Secondary | ICD-10-CM | POA: Insufficient documentation

## 2020-01-05 LAB — CBC WITH DIFFERENTIAL/PLATELET
Abs Immature Granulocytes: 0.01 10*3/uL (ref 0.00–0.07)
Basophils Absolute: 0.1 10*3/uL (ref 0.0–0.1)
Basophils Relative: 1 %
Eosinophils Absolute: 0 10*3/uL (ref 0.0–0.5)
Eosinophils Relative: 1 %
HCT: 37.9 % (ref 36.0–46.0)
Hemoglobin: 13.2 g/dL (ref 12.0–15.0)
Immature Granulocytes: 0 %
Lymphocytes Relative: 40 %
Lymphs Abs: 3.3 10*3/uL (ref 0.7–4.0)
MCH: 31.4 pg (ref 26.0–34.0)
MCHC: 34.8 g/dL (ref 30.0–36.0)
MCV: 90.2 fL (ref 80.0–100.0)
Monocytes Absolute: 0.5 10*3/uL (ref 0.1–1.0)
Monocytes Relative: 7 %
Neutro Abs: 4.2 10*3/uL (ref 1.7–7.7)
Neutrophils Relative %: 51 %
Platelets: 234 10*3/uL (ref 150–400)
RBC: 4.2 MIL/uL (ref 3.87–5.11)
RDW: 12.6 % (ref 11.5–15.5)
WBC: 8.1 10*3/uL (ref 4.0–10.5)
nRBC: 0 % (ref 0.0–0.2)

## 2020-01-05 LAB — COMPREHENSIVE METABOLIC PANEL
ALT: 18 U/L (ref 0–44)
AST: 19 U/L (ref 15–41)
Albumin: 3.9 g/dL (ref 3.5–5.0)
Alkaline Phosphatase: 63 U/L (ref 38–126)
Anion gap: 9 (ref 5–15)
BUN: 10 mg/dL (ref 8–23)
CO2: 27 mmol/L (ref 22–32)
Calcium: 9 mg/dL (ref 8.9–10.3)
Chloride: 103 mmol/L (ref 98–111)
Creatinine, Ser: 0.9 mg/dL (ref 0.44–1.00)
GFR calc Af Amer: >60 mL/min (ref >60)
GFR calc non Af Amer: >60 mL/min (ref >60)
Glucose, Bld: 114 mg/dL — ABNORMAL HIGH (ref 70–99)
Potassium: 3.8 mmol/L (ref 3.5–5.1)
Sodium: 139 mmol/L (ref 135–145)
Total Bilirubin: 0.5 mg/dL (ref 0.3–1.2)
Total Protein: 6.7 g/dL (ref 6.5–8.1)

## 2020-01-05 NOTE — Progress Notes (Signed)
Pt here for follow up. No new concerns voiced. Pt had recent CT scan and has some questions.

## 2020-01-05 NOTE — Progress Notes (Signed)
Hematology/Oncology Follow Up Note [facility] [phone] [phone] Patient Care Team: McLean-Scocuzza, Nino Glow, MD as PCP - General (Internal Medicine) Manya Silvas, MD (Inactive) (Gastroenterology) Bary Castilla, Forest Gleason, MD (General Surgery) Clent Jacks, RN as Registered Nurse  REASON FOR VISIT Follow up for treatment of management of colon cancer and iron deficiency anemia.  HISTORY OF PRESENTING ILLNESS:  [name] is a @ 74 y.o.  female with PMH listed below who was referred to me for evaluation and management of colon cancer.  a colonoscopy done on 12/25/2016 by Dr. Vira Agar. Moves her bowels daily. Patient states she is nausea, states it started at the end of June .She states she was having pain in her left upper quadrant pain was seen in the ER on 12/24/2016 Upper and lower endoscopies dated 12/28/2016 reviewed which showed Cecal mass, biopsy-proven adenocarcinoma. Patient underwent laparoscopically assisted right hemicolectomy with ileotransverse colostomy. Pathology revealed pT3N0 adenocarcinoma, with perineural invasion and lymphvascular invasion  #  Cancer Stage: pT3N0cM0, stage colon cancer. Lymphovascular Invasion: Present  Perineural Invasion: Present   Declined adjuvant chemotherapy. MMR- intact.  #Repeat colonoscopy 01/17/2018 by Dr. Vira Agar showed diverticulosis in the sigmoid colon, in the descending colon and transverse colon.  Internal hemorrhoids.  No specimen collected.  Interval History Patient presents for follow-up of colon cancer and iron deficiency anemia.  Patient reports feeling very well. No new complaints. She has had interval CT abdomen pelvis with contrast done. CT chest was ordered and was not done  Review of Systems  Constitutional: Negative for chills, fever, malaise/fatigue and weight loss.  HENT: Negative for sore throat.   Eyes: Negative for redness.  Respiratory: Negative for  cough, shortness of breath and wheezing.   Cardiovascular: Negative for chest pain, palpitations and leg swelling.  Gastrointestinal: Positive for nausea. Negative for abdominal pain, blood in stool and vomiting.  Genitourinary: Negative for dysuria.  Musculoskeletal: Negative for myalgias.  Skin: Negative for rash.  Neurological: Negative for dizziness, tingling and tremors.  Endo/Heme/Allergies: Does not bruise/bleed easily.  Psychiatric/Behavioral: Negative for hallucinations.     MEDICAL HISTORY:  Past Medical History:  Diagnosis Date  . Allergy   . Anemia   . Carcinoma ([place])   . Colon cancer ([place]) 01/2017  . Colon polyps   . Diverticulitis    01/2018 abscess hosp. 01/2018  . Endometriosis   . Fibromyalgia   . Gastric polyps   . GERD (gastroesophageal reflux disease)   . Hemorrhoids   . Hypothyroidism   . Iron deficiency anemia 03/08/2017  . Major depressive disorder, recurrent episode, mild (HCC)    Dr. Adele Schilder in [place] h/o ECT x 6 x and hosp x 2 and transcranial magnetic treatment   . Miscarriage    x2  . PONV (postoperative nausea and vomiting)   . Skin cancer    arm, thigh and chest Dr. Evorn Gong   . Sleep apnea    USES CPAP  . UTI (urinary tract infection)     SURGICAL HISTORY: Past Surgical History:  Procedure Laterality Date  . ABDOMINAL HYSTERECTOMY  1990   endometriosis  . BREAST BIOPSY Left 2004   benign  . CATARACT EXTRACTION W/PHACO Right 10/26/2018   Procedure: CATARACT EXTRACTION PHACO AND INTRAOCULAR LENS PLACEMENT ([place]) RIGHT TORIC LENS;  Surgeon: Leandrew Koyanagi, MD;  Location: [place];  Service: Ophthalmology;  Laterality: Right;  sleep apnea  . CATARACT EXTRACTION, BILATERAL Bilateral 05/2012  . CHOLECYSTECTOMY  2001  . COLONOSCOPY WITH PROPOFOL  N/A 12/28/2016   Procedure: COLONOSCOPY WITH PROPOFOL;  Surgeon: Manya Silvas, MD;  Location: [facility] ENDOSCOPY;  Service: Endoscopy;  Laterality: N/A;  . COLONOSCOPY WITH PROPOFOL N/A  01/17/2018   Procedure: COLONOSCOPY WITH PROPOFOL;  Surgeon: Manya Silvas, MD;  Location[facility] ENDOSCOPY;  Service: Endoscopy;  Laterality: N/A;  . ESOPHAGOGASTRODUODENOSCOPY (EGD) WITH PROPOFOL N/A 12/28/2016   Procedure: ESOPHAGOGASTRODUODENOSCOPY (EGD) WITH PROPOFOL;  Surgeon: Manya Silvas, MD;  Location: [facility] ENDOSCOPY;  Service: Endoscopy;  Laterality: N/A;  . EYE SURGERY     lens left eye, cataract right eye   . LAPAROSCOPIC RIGHT COLECTOMY Right 01/21/2017   Procedure: LAPAROSCOPIC ASSISTED RIGHT COLECTOMY;  Surgeon: Robert Bellow, MD;  Location: [facility] ORS;  Service: General;  Laterality: Right;  . TONSILLECTOMY      SOCIAL HISTORY: Social History   Socioeconomic History  . Marital status: Married    Spouse name: Not on file  . Number of children: Not on file  . Years of education: Not on file  . Highest education level: Not on file  Occupational History  . Not on file  Tobacco Use  . Smoking status: Never Smoker  . Smokeless tobacco: Never Used  Vaping Use  . Vaping Use: Never used  Substance and Sexual Activity  . Alcohol use: Not Currently    Alcohol/week: 0.0 standard drinks    Comment:    . Drug use: No  . Sexual activity: Yes    Partners: Male    Birth control/protection: None  Other Topics Concern  . 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 Difficulty of Paying Living Expenses: Not hard at all  Food Insecurity: No Food Insecurity  . Worried About Charity fundraiser in the Last Year: Never true  . Ran Out of Food in the Last Year: Never true  Transportation Needs: No Transportation Needs  . Lack of Transportation (Medical): No  . Lack of Transportation (Non-Medical): No  Physical Activity:   . Days of Exercise per Week: Not on file  .  Minutes of Exercise per Session: Not on file  Stress: No Stress Concern Present  . Feeling of Stress : Not at all  Social Connections:   . Frequency of Communication with Friends and Family: Not on file  . Frequency of Social Gatherings with Friends and Family: Not on file  . Attends Religious Services: Not on file  . Active Member of Clubs or Organizations: Not on file  . Attends Archivist Meetings: Not on file  . Marital Status: Not on file  Intimate Partner Violence: Not At Risk  . Fear of Current or Ex-Partner: No  . Emotionally Abused: No  . Physically Abused: No  . Sexually Abused: No    FAMILY HISTORY: Family History  Problem Relation Age of Onset  . Depression Brother   . Alcohol abuse Brother   . Suicidality Brother   . Stroke Mother   . Hyperlipidemia Mother   . Hypertension Mother   . Clotting disorder Brother   . Cancer Brother        MDS  . Colon polyps Father   . Melanoma Sister   . Cancer Sister  skin MM  . Stroke Maternal Grandfather   . Melanoma Nephew   . Bipolar disorder Neg Hx     ALLERGIES:  is allergic to other and codeine.  MEDICATIONS:  Current Outpatient Medications  Medication Sig Dispense Refill  . calcium-vitamin D (OSCAL WITH D) 500-200 MG-UNIT tablet Take 2 tablets by mouth 2 (two) times daily.    . cetirizine (ZYRTEC) 10 MG tablet Take 10 mg by mouth daily as needed for allergies.     Marland Kitchen estradiol (ESTRACE) 0.5 MG tablet Take 1 tablet (0.5 mg total) by mouth daily. 90 tablet 3  . lamoTRIgine (LAMICTAL) 200 MG tablet Take 1 tablet (200 mg total) by mouth every morning. 90 tablet 0  . levothyroxine (SYNTHROID) 25 MCG tablet Take 1 tablet (25 mcg total) by mouth daily before breakfast. Will need TSH lab in 6-8 weeks call the office 90 tablet 3  . methylphenidate (RITALIN) 5 MG tablet Take one tab daily 30 tablet 0  . Multiple Vitamins-Minerals (MULTIVITAMIN WITH MINERALS) tablet Take 1 tablet by mouth daily.    Marland Kitchen omeprazole  (PRILOSEC) 20 MG capsule Take 1 capsule (20 mg total) by mouth daily. 30 minutes before lunch wait 3-4 hours after thyroid pill before taking 90 capsule 3  . ondansetron (ZOFRAN-ODT) 4 MG disintegrating tablet Take 1 tablet (4 mg total) by mouth 2 (two) times daily as needed for nausea or vomiting. AND PRN if this continues see gastroenterology 180 tablet 1  . traZODone (DESYREL) 50 MG tablet Take 1 tablet (50 mg total) by mouth at bedtime. 90 tablet 0  . TRINTELLIX 20 MG TABS tablet Take 1 tablet (20 mg total) by mouth every morning. 30 tablet 2  . VITAMIN D PO Take by mouth daily.     No current facility-administered medications for this visit.      Marland Kitchen  PHYSICAL EXAMINATION: ECOG PERFORMANCE STATUS: 0 - Asymptomatic Vitals:   01/05/20 1303  BP: 117/70  Pulse: 67  Resp: 18  Temp: 97.9 F (36.6 C)   Filed Weights   01/05/20 1303  Weight: 157 lb 9.6 oz (71.5 kg)   Physical Exam Constitutional:      General: She is not in acute distress.    Appearance: She is not diaphoretic.  HENT:     Head: Normocephalic and atraumatic.     Mouth/Throat:     Pharynx: No oropharyngeal exudate.  Eyes:     General: No scleral icterus.    Pupils: Pupils are equal, round, and reactive to light.  Cardiovascular:     Rate and Rhythm: Normal rate and regular rhythm.     Heart sounds: No murmur heard.   Pulmonary:     Effort: Pulmonary effort is normal. No respiratory distress.     Breath sounds: No rales.  Chest:     Chest wall: No tenderness.  Abdominal:     General: There is no distension.     Palpations: Abdomen is soft.     Tenderness: There is no abdominal tenderness.  Musculoskeletal:        General: Normal range of motion.     Cervical back: Normal range of motion and neck supple.  Skin:    General: Skin is warm and dry.     Findings: No erythema.  Neurological:     Mental Status: She is alert and oriented to person, place, and time.  Psychiatric:        Mood and Affect:  Affect normal.  LABORATORY DATA:  I have reviewed the data as listed Lab Results  Component Value Date   WBC 8.1 01/05/2020   HGB 13.2 01/05/2020   HCT 37.9 01/05/2020   MCV 90.2 01/05/2020   PLT 234 01/05/2020   Recent Labs    04/04/19 1336 06/28/19 1003 07/03/19 1308 01/03/20 1008 01/05/20 1243  NA 140  --  139  --  139  K 4.3  --  4.0  --  3.8  CL 106  --  103  --  103  CO2 28  --  28  --  27  GLUCOSE 94  --  114*  --  114*  BUN 10  --  11  --  10  CREATININE 1.04*   < > 0.99 1.10* 0.90  CALCIUM 9.1  --  9.4  --  9.0  GFRNONAA 54*  --  57*  --  >60  GFRAA >60  --  >60  --  >60  PROT 7.0  --  6.7  --  6.7  ALBUMIN 3.9  --  4.0  --  3.9  AST 16  --  16  --  19  ALT 17  --  13  --  18  ALKPHOS 63  --  53  --  63  BILITOT 0.4  --  0.4  --  0.5   < > = values in this interval not displayed.    RADIOGRAPHIC STUDIES: I have personally reviewed the radiological images as listed and agreed with the findings in the report. CT Abdomen Pelvis W Contrast  Result Date: 01/03/2020 CLINICAL DATA:  Follow-up colon cancer. EXAM: CT ABDOMEN AND PELVIS WITH CONTRAST TECHNIQUE: Multidetector CT imaging of the abdomen and pelvis was performed using the standard protocol following bolus administration of intravenous contrast. CONTRAST:  184m OMNIPAQUE IOHEXOL 300 MG/ML  SOLN COMPARISON:  06/28/2019. FINDINGS: Lower chest: No acute abnormality. Hepatobiliary: 8 mm low-density structure within segment 3 is identified and appears unchanged from previous exam, image 23/2. Favor small cyst. No suspicious liver lesions identified at this time. Previous cholecystectomy. No biliary dilatation. Pancreas: Unremarkable. No pancreatic ductal dilatation or surrounding inflammatory changes. Spleen: Normal in size without focal abnormality. Adrenals/Urinary Tract: Adrenal glands are unremarkable. Unchanged 6 mm low-density structure in the medial cortex of the left mid kidney, image 16/9. This is too  small to reliably characterize. The kidneys are otherwise unremarkable. No suspicious mass or hydronephrosis noted. Urinary bladder is unremarkable. Stomach/Bowel: Again seen are multiple due gastric polyps, image 16/2. Stomach is nondistended. No small bowel wall thickening, inflammation, or distension. Status post right hemicolectomy with enterocolonic anastomosis. Extensive sigmoid diverticulosis identified. There is high attenuation material within the dome of vagina which raises the concern for rectovaginal fistula. Possible fistula tract can be seen along the left posterior wall of the vagina, image 79/2. Vascular/Lymphatic: Mild aortic atherosclerosis. No abdominopelvic adenopathy. Reproductive: Status post hysterectomy. No adnexal mass identified. As mentioned above there is high attenuation material noted within the vagina which raises the concern for rectovaginal fistula. Other: No free fluid or fluid collections. No signs of peritoneal disease identified. Musculoskeletal: Spondylosis identified within the lumbar spine. No acute or suspicious osseous findings. IMPRESSION: 1. Status post right hemicolectomy with enterocolonic anastomosis. No findings of recurrent tumor or metastatic disease. 2. There is high attenuation material within the dome of vagina which raises the concern for rectovaginal fistula. Possible fistula tract can be seen along the left posterior wall of the vagina. Clinical correlation is  recommended. 3. Multiple gastric polyps. 4. Aortic atherosclerosis. Aortic Atherosclerosis (ICD10-I70.0). Electronically Signed   By: Kerby Moors M.D.   On: 01/03/2020 13:06    ASSESSMENT & PLAN:  1. History of colon cancer   Cancer Staging Cancer of right colon [facility]) Staging form: Colon and Rectum, AJCC 8th Edition - Clinical: cT1 - Unsigned - Pathologic stage from 03/08/2017: Stage IIA (pT3, pN0, cM0) - Signed by Earlie Server, MD on 03/08/2017  #History of stage IIA right side colon cancer  (03/2017), with high risk feasures: Lymphovascular Invasion, Perineural Invasion:  Patient declined adjuvant chemotherapy. Labs reviewed and discussed with patient. Stable counts. CEA has been monitored and has been stable. Today CEA is pending. Interval CT abdomen pelvis was obtained and images were independently reviewed by me and discussed with patient. No findings of recurrent tumor or metastatic disease. Most recent colonoscopy was 01/20/2018-patient was recommended to repeat colonoscopy in 2 years which is due now. I refer patient to establish care with Dr. Alice Reichert for repeat colonoscopy, and evaluation of possible rectovaginal fistula, and gastric polyp.  Incidental finding of high attenuation material within the dome of the vagina which raises concerns of rectovaginal fistula. Patient denies any vaginal discharge. Patient takes estradiol supplements and I have recommended her to follow-up with gynecologist. Today she informed me that she does not actively currently see any gynecologist.  History of multiple gastric polyps, biopsied on 12/28/2016. Benign pathology. Recommend patient to continue follow-up with gastroenterology. Marland Kitchen #Nausea.  Chronic problem for her.Etiology unclear.    Recommend CBC, CMP, CEA every 6 and follow-up in the clinic. Obtain CT chest abdomen pelvis in 6 months. After that we will transition to annual CT.  All questions were answered. The patient knows to call the clinic with any problems questions or concerns. We spent sufficient time to discuss many aspect of care, questions were answered to patient's satisfaction.  Return of visit:  6 months.  Orders Placed This Encounter  Procedures  . CT CHEST ABDOMEN PELVIS W CONTRAST    Standing Status:   Future    Standing Expiration Date:   01/04/2021    Order Specific Question:   Reason for Exam (SYMPTOM  OR DIAGNOSIS REQUIRED)    Answer:   hx colon cancer    Order Specific Question:   Preferred imaging location?     Answer:   [facility]    Order Specific Question:   Radiology Contrast Protocol - do NOT remove file path    Answer:   [email or web address]  . CBC with Differential/Platelet    Standing Status:   Future    Standing Expiration Date:   01/04/2021  . Comprehensive metabolic panel    Standing Status:   Future    Standing Expiration Date:   01/04/2021  . CEA    Standing Status:   Future    Standing Expiration Date:   01/04/2021  . Ambulatory referral to Gastroenterology    Referral Priority:   Routine    Referral Type:   Consultation    Referral Reason:   Specialty Services Required    Referred to Provider:   Efrain Sella, MD    Number of Visits Requested:   1     Earlie Server, MD, PhD Hematology Oncology [facility] 01/05/2020

## 2020-01-06 LAB — CEA: CEA: 2.1 ng/mL (ref 0.0–4.7)

## 2020-01-09 ENCOUNTER — Other Ambulatory Visit: Payer: Self-pay

## 2020-01-09 ENCOUNTER — Other Ambulatory Visit (INDEPENDENT_AMBULATORY_CARE_PROVIDER_SITE_OTHER): Payer: Medicare Other

## 2020-01-09 DIAGNOSIS — R5383 Other fatigue: Secondary | ICD-10-CM | POA: Diagnosis not present

## 2020-01-09 DIAGNOSIS — E785 Hyperlipidemia, unspecified: Secondary | ICD-10-CM | POA: Diagnosis not present

## 2020-01-09 DIAGNOSIS — E039 Hypothyroidism, unspecified: Secondary | ICD-10-CM

## 2020-01-09 LAB — CBC WITH DIFFERENTIAL/PLATELET
Basophils Absolute: 0 10*3/uL (ref 0.0–0.1)
Basophils Relative: 0.4 % (ref 0.0–3.0)
Eosinophils Absolute: 0 10*3/uL (ref 0.0–0.7)
Eosinophils Relative: 0.5 % (ref 0.0–5.0)
HCT: 40.7 % (ref 36.0–46.0)
Hemoglobin: 13.7 g/dL (ref 12.0–15.0)
Lymphocytes Relative: 47.3 % — ABNORMAL HIGH (ref 12.0–46.0)
Lymphs Abs: 3.9 10*3/uL (ref 0.7–4.0)
MCHC: 33.8 g/dL (ref 30.0–36.0)
MCV: 92.4 fl (ref 78.0–100.0)
Monocytes Absolute: 0.4 10*3/uL (ref 0.1–1.0)
Monocytes Relative: 4.8 % (ref 3.0–12.0)
Neutro Abs: 3.9 10*3/uL (ref 1.4–7.7)
Neutrophils Relative %: 47 % (ref 43.0–77.0)
Platelets: 268 10*3/uL (ref 150.0–400.0)
RBC: 4.4 Mil/uL (ref 3.87–5.11)
RDW: 13.1 % (ref 11.5–15.5)
WBC: 8.3 10*3/uL (ref 4.0–10.5)

## 2020-01-09 LAB — LIPID PANEL
Cholesterol: 218 mg/dL — ABNORMAL HIGH (ref 0–200)
HDL: 70.5 mg/dL (ref >39.00)
LDL Cholesterol: 129 mg/dL — ABNORMAL HIGH (ref 0–99)
NonHDL: 147.28
Total CHOL/HDL Ratio: 3
Triglycerides: 90 mg/dL (ref 0.0–149.0)
VLDL: 18 mg/dL (ref 0.0–40.0)

## 2020-01-09 LAB — COMPREHENSIVE METABOLIC PANEL
ALT: 15 U/L (ref 0–35)
AST: 15 U/L (ref 0–37)
Albumin: 4.3 g/dL (ref 3.5–5.2)
Alkaline Phosphatase: 62 U/L (ref 39–117)
BUN: 10 mg/dL (ref 6–23)
CO2: 29 mEq/L (ref 19–32)
Calcium: 9.8 mg/dL (ref 8.4–10.5)
Chloride: 102 mEq/L (ref 96–112)
Creatinine, Ser: 0.98 mg/dL (ref 0.40–1.20)
GFR: 55.69 mL/min — ABNORMAL LOW (ref >60.00)
Glucose, Bld: 84 mg/dL (ref 70–99)
Potassium: 3.8 mEq/L (ref 3.5–5.1)
Sodium: 140 mEq/L (ref 135–145)
Total Bilirubin: 0.6 mg/dL (ref 0.2–1.2)
Total Protein: 6.9 g/dL (ref 6.0–8.3)

## 2020-01-09 LAB — TSH: TSH: 4.12 u[IU]/mL (ref 0.35–4.50)

## 2020-01-12 ENCOUNTER — Encounter: Payer: Self-pay | Admitting: Internal Medicine

## 2020-01-12 ENCOUNTER — Ambulatory Visit (INDEPENDENT_AMBULATORY_CARE_PROVIDER_SITE_OTHER): Payer: Medicare Other | Admitting: Internal Medicine

## 2020-01-12 ENCOUNTER — Other Ambulatory Visit: Payer: Self-pay

## 2020-01-12 VITALS — BP 124/68 | HR 72 | Temp 98.7°F | Ht 62.0 in | Wt 155.8 lb

## 2020-01-12 DIAGNOSIS — Z23 Encounter for immunization: Secondary | ICD-10-CM

## 2020-01-12 DIAGNOSIS — E785 Hyperlipidemia, unspecified: Secondary | ICD-10-CM | POA: Diagnosis not present

## 2020-01-12 DIAGNOSIS — K317 Polyp of stomach and duodenum: Secondary | ICD-10-CM | POA: Diagnosis not present

## 2020-01-12 DIAGNOSIS — E039 Hypothyroidism, unspecified: Secondary | ICD-10-CM

## 2020-01-12 DIAGNOSIS — I700 Atherosclerosis of aorta: Secondary | ICD-10-CM | POA: Diagnosis not present

## 2020-01-12 DIAGNOSIS — N959 Unspecified menopausal and perimenopausal disorder: Secondary | ICD-10-CM

## 2020-01-12 DIAGNOSIS — N823 Fistula of vagina to large intestine: Secondary | ICD-10-CM | POA: Diagnosis not present

## 2020-01-12 NOTE — Progress Notes (Signed)
Chief Complaint  Patient presents with   Follow-up   Immunizations    flu shot high dose    F/u  1. Recent ct ab/pelvis 01/03/20 c/w rectovaginal fistula appt with GI sch and needs referral to urogyn disc sx's she Is only having is fecal urgency but this is been since colon resection for right sided colon cancer and does not see stool coming from her vaginal  2. hld reviewed today she is doing more plant based diet but not exercising as much and will incorporate more exercise into routine  3. Wants flu shot today  4. Hypothyroidism on levo 25 mcg qd and tsh 4.12    Review of Systems  Constitutional: Negative for weight loss.  HENT: Negative for hearing loss.   Eyes: Negative for blurred vision.  Respiratory: Negative for shortness of breath.   Cardiovascular: Negative for chest pain.  Gastrointestinal:       +stool urgency   Musculoskeletal: Negative for falls and joint pain.  Skin: Negative for rash.  Psychiatric/Behavioral: Negative for depression.   Past Medical History:  Diagnosis Date   Allergy    Anemia    Carcinoma ([place])    Colon cancer ([place]) 01/2017   Colon polyps    Diverticulitis    01/2018 abscess hosp. 01/2018   Endometriosis    Fibromyalgia    Gastric polyps    GERD (gastroesophageal reflux disease)    Hemorrhoids    Hypothyroidism    Iron deficiency anemia 03/08/2017   Major depressive disorder, recurrent episode, mild (HCC)    Dr. Adele Schilder in [place] h/o ECT x 6 x and hosp x 2 and transcranial magnetic treatment    Miscarriage    x2   PONV (postoperative nausea and vomiting)    Skin cancer    arm, thigh and chest Dr. Evorn Gong    Sleep apnea    USES CPAP   UTI (urinary tract infection)    Past Surgical History:  Procedure Laterality Date   ABDOMINAL HYSTERECTOMY  1990   endometriosis   BREAST BIOPSY Left 2004   benign   CATARACT EXTRACTION W/PHACO Right 10/26/2018   Procedure: CATARACT EXTRACTION PHACO AND INTRAOCULAR LENS PLACEMENT  ([place]) RIGHT TORIC LENS;  Surgeon: Leandrew Koyanagi, MD;  Location: [facility];  Service: Ophthalmology;  Laterality: Right;  sleep apnea   CATARACT EXTRACTION, BILATERAL Bilateral 05/2012   CHOLECYSTECTOMY  2001   COLONOSCOPY WITH PROPOFOL N/A 12/28/2016   Procedure: COLONOSCOPY WITH PROPOFOL;  Surgeon: Manya Silvas, MD;  Location: [facility] ENDOSCOPY;  Service: Endoscopy;  Laterality: N/A;   COLONOSCOPY WITH PROPOFOL N/A 01/17/2018   Procedure: COLONOSCOPY WITH PROPOFOL;  Surgeon: Manya Silvas, MD;  Location: [facility] ENDOSCOPY;  Service: Endoscopy;  Laterality: N/A;   ESOPHAGOGASTRODUODENOSCOPY (EGD) WITH PROPOFOL N/A 12/28/2016   Procedure: ESOPHAGOGASTRODUODENOSCOPY (EGD) WITH PROPOFOL;  Surgeon: Manya Silvas, MD;  Location: [facility] ENDOSCOPY;  Service: Endoscopy;  Laterality: N/A;   EYE SURGERY     lens left eye, cataract right eye    LAPAROSCOPIC RIGHT COLECTOMY Right 01/21/2017   Procedure: LAPAROSCOPIC ASSISTED RIGHT COLECTOMY;  Surgeon: Robert Bellow, MD;  Location: [facility] ORS;  Service: General;  Laterality: Right;   TONSILLECTOMY     Family History  Problem Relation Age of Onset   Depression Brother    Alcohol abuse Brother    Suicidality Brother    Stroke Mother    Hyperlipidemia Mother    Hypertension Mother    Clotting disorder Brother    Cancer  Brother        MDS   Colon polyps Father    Melanoma Sister    Cancer Sister        skin MM   Stroke Maternal Grandfather    Melanoma Nephew    Bipolar disorder Neg Hx    Social History   Socioeconomic History   Marital status: Married    Spouse name: Not on file   Number of children: Not on file   Years of education: Not on file   Highest education level: Not on file  Occupational History   Not on file  Tobacco Use   Smoking status: Never Smoker   Smokeless tobacco: Never Used  Vaping Use   Vaping Use: Never used  Substance and Sexual Activity   Alcohol use: Not  Currently    Alcohol/week: 0.0 standard drinks    Comment:     Drug use: No   Sexual activity: Yes    Partners: Male    Birth control/protection: None  Other Topics Concern   Not on file  Social History Narrative   Light smoker age 74 stopped    2 sons    Married    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, housewife    Owns guns, wears seat belt, safe in relationship    Social Determinants of Radio broadcast assistant Strain: Low Risk    Difficulty of Paying Living Expenses: Not hard at all  Food Insecurity: No Food Insecurity   Worried About Charity fundraiser in the Last Year: Never true   Arboriculturist in the Last Year: Never true  Transportation Needs: No Transportation Needs   Lack of Transportation (Medical): No   Lack of Transportation (Non-Medical): No  Physical Activity:    Days of Exercise per Week: Not on file   Minutes of Exercise per Session: Not on file  Stress: No Stress Concern Present   Feeling of Stress : Not at all  Social Connections:    Frequency of Communication with Friends and Family: Not on file   Frequency of Social Gatherings with Friends and Family: Not on file   Attends Religious Services: Not on Electrical engineer or Organizations: Not on file   Attends Archivist Meetings: Not on file   Marital Status: Not on file  Intimate Partner Violence: Not At Risk   Fear of Current or Ex-Partner: No   Emotionally Abused: No   Physically Abused: No   Sexually Abused: No   Current Meds  Medication Sig   calcium-vitamin D (OSCAL WITH D) 500-200 MG-UNIT tablet Take 2 tablets by mouth 2 (two) times daily.   cetirizine (ZYRTEC) 10 MG tablet Take 10 mg by mouth daily as needed for allergies.    estradiol (ESTRACE) 0.5 MG tablet Take 1 tablet (0.5 mg total) by mouth daily.   lamoTRIgine (LAMICTAL) 200 MG tablet Take 1 tablet (200 mg total) by mouth every morning.   levothyroxine (SYNTHROID) 25 MCG tablet Take 1 tablet (25 mcg  total) by mouth daily before breakfast. Will need TSH lab in 6-8 weeks call the office   methylphenidate (RITALIN) 5 MG tablet Take one tab daily   Multiple Vitamins-Minerals (MULTIVITAMIN WITH MINERALS) tablet Take 1 tablet by mouth daily.   omeprazole (PRILOSEC) 20 MG capsule Take 1 capsule (20 mg total) by mouth daily. 30 minutes before lunch wait 3-4 hours after thyroid pill before taking   ondansetron (ZOFRAN-ODT) 4 MG disintegrating tablet  Take 1 tablet (4 mg total) by mouth 2 (two) times daily as needed for nausea or vomiting. AND PRN if this continues see gastroenterology   Probiotic Product (PROBIOTIC PO) Take by mouth.   traZODone (DESYREL) 50 MG tablet Take 1 tablet (50 mg total) by mouth at bedtime.   TRINTELLIX 20 MG TABS tablet Take 1 tablet (20 mg total) by mouth every morning.   Allergies  Allergen Reactions   Other     BAND-AIDS IF LEFT ON FOR EXTENDED PERIODS-REDNESS   Codeine     Nausea   Recent Results (from the past 2160 hour(s))  I-STAT creatinine     Status: Abnormal   Collection Time: 01/03/20 10:08 AM  Result Value Ref Range   Creatinine, Ser 1.10 (H) 0.44 - 1.00 mg/dL  CEA     Status: None   Collection Time: 01/05/20 12:43 PM  Result Value Ref Range   CEA 2.1 0.0 - 4.7 ng/mL    Comment: (NOTE)                             Nonsmokers          <3.9                             Smokers             <5.6 Roche [employer]ostics Electrochemiluminescence Immunoassay (ECLIA) Values obtained with different assay methods or kits cannot be used interchangeably.  Results cannot be interpreted as absolute evidence of the presence or absence of malignant disease. Performed At: [facility] [place], [place] [id number] Rush Farmer MD AC:[id number]   Comprehensive metabolic panel     Status: Abnormal   Collection Time: 01/05/20 12:43 PM  Result Value Ref Range   Sodium 139 135 - 145 mmol/L   Potassium 3.8 3.5 - 5.1 mmol/L   Chloride 103  98 - 111 mmol/L   CO2 27 22 - 32 mmol/L   Glucose, Bld 114 (H) 70 - 99 mg/dL    Comment: Glucose reference range applies only to samples taken after fasting for at least 8 hours.   BUN 10 8 - 23 mg/dL   Creatinine, Ser 0.90 0.44 - 1.00 mg/dL   Calcium 9.0 8.9 - 10.3 mg/dL   Total Protein 6.7 6.5 - 8.1 g/dL   Albumin 3.9 3.5 - 5.0 g/dL   AST 19 15 - 41 U/L   ALT 18 0 - 44 U/L   Alkaline Phosphatase 63 38 - 126 U/L   Total Bilirubin 0.5 0.3 - 1.2 mg/dL   GFR calc non Af Amer >60 >60 mL/min   GFR calc Af Amer >60 >60 mL/min   Anion gap 9 5 - 15    Comment: Performed at [facility], [address] [place], [place] [address]  CBC with Differential/Platelet     Status: None   Collection Time: 01/05/20 12:43 PM  Result Value Ref Range   WBC 8.1 4.0 - 10.5 K/uL   RBC 4.20 3.87 - 5.11 MIL/uL   Hemoglobin 13.2 12.0 - 15.0 g/dL   HCT 37.9 36 - 46 %   MCV 90.2 80.0 - 100.0 fL   MCH 31.4 26.0 - 34.0 pg   MCHC 34.8 30.0 - 36.0 g/dL   RDW 12.6 11.5 - 15.5 %   Platelets 234 150 - 400 K/uL   nRBC 0.0  0.0 - 0.2 %   Neutrophils Relative % 51 %   Neutro Abs 4.2 1.7 - 7.7 K/uL   Lymphocytes Relative 40 %   Lymphs Abs 3.3 0.7 - 4.0 K/uL   Monocytes Relative 7 %   Monocytes Absolute 0.5 0 - 1 K/uL   Eosinophils Relative 1 %   Eosinophils Absolute 0.0 0 - 0 K/uL   Basophils Relative 1 %   Basophils Absolute 0.1 0 - 0 K/uL   Immature Granulocytes 0 %   Abs Immature Granulocytes 0.01 0.00 - 0.07 K/uL    Comment: Performed at [facility], [address] [place], [place] [address]  CBC w/Diff     Status: Abnormal   Collection Time: 01/09/20  8:29 AM  Result Value Ref Range   WBC 8.3 4.0 - 10.5 K/uL   RBC 4.40 3.87 - 5.11 Mil/uL   Hemoglobin 13.7 12.0 - 15.0 g/dL   HCT 40.7 36 - 46 %   MCV 92.4 78.0 - 100.0 fl   MCHC 33.8 30.0 - 36.0 g/dL   RDW 13.1 11.5 - 15.5 %   Platelets 268.0 150 - 400 K/uL   Neutrophils Relative % 47.0 43 - 77 %   Lymphocytes Relative 47.3 (H) 12 -  46 %   Monocytes Relative 4.8 3 - 12 %   Eosinophils Relative 0.5 0 - 5 %   Basophils Relative 0.4 0 - 3 %   Neutro Abs 3.9 1.4 - 7.7 K/uL   Lymphs Abs 3.9 0.7 - 4.0 K/uL   Monocytes Absolute 0.4 0 - 1 K/uL   Eosinophils Absolute 0.0 0 - 0 K/uL   Basophils Absolute 0.0 0 - 0 K/uL  Lipid panel     Status: Abnormal   Collection Time: 01/09/20  8:29 AM  Result Value Ref Range   Cholesterol 218 (H) 0 - 200 mg/dL    Comment: ATP III Classification       Desirable:  < 200 mg/dL               Borderline High:  200 - 239 mg/dL          High:  > = 240 mg/dL   Triglycerides 90.0 0 - 149 mg/dL    Comment: Normal:  <150 mg/dLBorderline High:  150 - 199 mg/dL   HDL 70.50 >39.00 mg/dL   VLDL 18.0 0.0 - 40.0 mg/dL   LDL Cholesterol 129 (H) 0 - 99 mg/dL   Total CHOL/HDL Ratio 3     Comment:                Men          Women1/2 Average Risk     3.4          3.3Average Risk          5.0          4.42X Average Risk          9.6          7.13X Average Risk          15.0          11.0                       NonHDL 147.28     Comment: NOTE:  Non-HDL goal should be 30 mg/dL higher than patient's LDL goal (i.e. LDL goal of < 70 mg/dL, would have non-HDL goal of < 100 mg/dL)  TSH  Status: None   Collection Time: 01/09/20  8:29 AM  Result Value Ref Range   TSH 4.12 0.35 - 4.50 uIU/mL  Comprehensive metabolic panel     Status: Abnormal   Collection Time: 01/09/20  8:29 AM  Result Value Ref Range   Sodium 140 135 - 145 mEq/L   Potassium 3.8 3.5 - 5.1 mEq/L   Chloride 102 96 - 112 mEq/L   CO2 29 19 - 32 mEq/L   Glucose, Bld 84 70 - 99 mg/dL   BUN 10 6 - 23 mg/dL   Creatinine, Ser 0.98 0.40 - 1.20 mg/dL   Total Bilirubin 0.6 0.2 - 1.2 mg/dL   Alkaline Phosphatase 62 39 - 117 U/L   AST 15 0 - 37 U/L   ALT 15 0 - 35 U/L   Total Protein 6.9 6.0 - 8.3 g/dL   Albumin 4.3 3.5 - 5.2 g/dL   GFR 55.69 (L) >60.00 mL/min   Calcium 9.8 8.4 - 10.5 mg/dL   Objective  Body mass index is 28.5 kg/m. Wt Readings  from Last 3 Encounters:  01/12/20 155 lb 12.8 oz (70.7 kg)  01/05/20 157 lb 9.6 oz (71.5 kg)  07/11/19 150 lb (68 kg)   Temp Readings from Last 3 Encounters:  01/12/20 98.7 F (37.1 C) (Oral)  01/05/20 97.9 F (36.6 C)  07/03/19 (!) 96.2 F (35.7 C) (Tympanic)   BP Readings from Last 3 Encounters:  01/12/20 124/68  01/05/20 117/70  07/03/19 118/74   Pulse Readings from Last 3 Encounters:  01/12/20 72  01/05/20 67  07/03/19 67    Physical Exam Vitals and nursing note reviewed.  Constitutional:      Appearance: Normal appearance. She is well-developed and well-groomed.  HENT:     Head: Normocephalic and atraumatic.  Eyes:     Conjunctiva/sclera: Conjunctivae normal.     Pupils: Pupils are equal, round, and reactive to light.  Cardiovascular:     Rate and Rhythm: Normal rate and regular rhythm.     Heart sounds: Normal heart sounds. No murmur heard.   Pulmonary:     Effort: Pulmonary effort is normal.     Breath sounds: Normal breath sounds.  Skin:    General: Skin is warm and dry.  Neurological:     General: No focal deficit present.     Mental Status: She is alert and oriented to person, place, and time. Mental status is at baseline.     Gait: Gait normal.  Psychiatric:        Attention and Perception: Attention and perception normal.        Mood and Affect: Mood and affect normal.        Speech: Speech normal.        Behavior: Behavior normal. Behavior is cooperative.        Thought Content: Thought content normal.        Cognition and Memory: Cognition and memory normal.        Judgment: Judgment normal.     Assessment  Plan  Rectovaginal fistula - Plan: Ambulatory referral to Gastroenterology Dr. Alice Reichert, Ambulatory referral to Urogynecology [facility] Reviewed CT 01/03/20 01/03/20 CT ab/pelvis w/contrast IMPRESSION: 1. Status post right hemicolectomy with enterocolonic anastomosis. No findings of recurrent tumor or metastatic disease. 2. There is high  attenuation material within the dome of vagina which raises the concern for rectovaginal fistula. Possible fistula tract can be seen along the left posterior wall of the vagina. Clinical correlation is recommended. 3.  Multiple gastric polyps. 4. Aortic atherosclerosis.  Aortic Atherosclerosis (ICD10-I70.0).   Multiple gastric polyps - Plan: Ambulatory referral to Gastroenterology  Aortic atherosclerosis ([place]) Hyperlipidemia, unspecified hyperlipidemia type rec healthy diet doing veggie diet and exercise  Monitor   Hypothyroidism, unspecified type  Cont levo 25 mcg qd   HM Flu shot utdgiven today Tdap 10/15/15and 08/10/18 utd prevnar pna 231/28/20 03/2013 zostervax 2/2 shingrix  hep C negative covid 2/2 had 3rd due 2nd week 02/10/20  mammo12/10/20 negative  S/p hysterectomy out of age window pap -pap 10/15/14 negative neg HPV  Colonoscopy 01/17/18 diverticulosis, [facility] Dr. Tiffany Kocher repeat in 2 years h/o colon cancer -per pt needs smallest scope and not sure if wants f/u 01/18/20 consider CT entero in the future 07/17/19 GI appt [facility]  DEXA 12/21/17 osteopenia   Light smoker age 46 y.o  Dermatology Dr. Inez Catalina office Q6 monthsh/o nmsc and mohs 06/2019 and 07/2019 Dr. Natale Lay  Derm due yearly in 2022   Former PCP Dr. Clayborn Bigness requested records today including carotid US from 01/2018 in system w/o result. Pt to call/send letter to get results faxed to [name]   Specialist  Dr.Yu [facility] GI Dr. Kellie Moor derm  Psych   Provider: Dr. Olivia Mackie McLean-Scocuzza-Internal Medicine

## 2020-01-12 NOTE — Patient Instructions (Addendum)
[facility] 02/10/20 pfizer booster shot   [facility] urogyn     Results for [name], Emily MORRISS "[name]" (MRN [id number]) as of 01/12/2020 11:26  Ref. Range 11/25/2017 10:24 03/14/2019 08:50 01/09/2020 08:29  Cholesterol Latest Ref Range: 0 - 200 mg/dL  200 218 (H)  Cholesterol, Total Latest Ref Range: 100 - 199 mg/dL 172    HDL Cholesterol Latest Ref Range: >39.00 mg/dL 52 67.60 70.50  LDL (calc) Latest Ref Range: 0 - 99 mg/dL 94 112 (H) 129 (H)  LDL/HDL Ratio Latest Ref Range: 0.0 - 3.2 ratio 1.8    NonHDL Unknown  132.45 147.28  Triglycerides Latest Ref Range: 0 - 149 mg/dL 129 102.0 90.0  VLDL Latest Ref Range: 0.0 - 40.0 mg/dL  20.4 18.0     High Cholesterol  High cholesterol is a condition in which the blood has high levels of a white, waxy, fat-like substance (cholesterol). The human body needs small amounts of cholesterol. The liver makes all the cholesterol that the body needs. Extra (excess) cholesterol comes from the food that we eat. Cholesterol is carried from the liver by the blood through the blood vessels. If you have high cholesterol, deposits (plaques) may build up on the walls of your blood vessels (arteries). Plaques make the arteries narrower and stiffer. Cholesterol plaques increase your risk for heart attack and stroke. Work with your health care provider to keep your cholesterol levels in a healthy range. What increases the risk? This condition is more likely to develop in people who:  Eat foods that are high in animal fat (saturated fat) or cholesterol.  Are overweight.  Are not getting enough exercise.  Have a family history of high cholesterol. What are the signs or symptoms? There are no symptoms of this condition. How is this diagnosed? This condition may be diagnosed from the results of a blood test.  If you are older than age 62, your health care provider may check your cholesterol every 4-6 years.  You may be checked more often if you already  have high cholesterol or other risk factors for heart disease. The blood test for cholesterol measures:  "Bad" cholesterol (LDL cholesterol). This is the main type of cholesterol that causes heart disease. The desired level for LDL is less than 100.  "Good" cholesterol (HDL cholesterol). This type helps to protect against heart disease by cleaning the arteries and carrying the LDL away. The desired level for HDL is 60 or higher.  Triglycerides. These are fats that the body can store or burn for energy. The desired number for triglycerides is lower than 150.  Total cholesterol. This is a measure of the total amount of cholesterol in your blood, including LDL cholesterol, HDL cholesterol, and triglycerides. A healthy number is less than 200. How is this treated? This condition is treated with diet changes, lifestyle changes, and medicines. Diet changes  This may include eating more whole grains, fruits, vegetables, nuts, and fish.  This may also include cutting back on red meat and foods that have a lot of added sugar. Lifestyle changes  Changes may include getting at least 40 minutes of aerobic exercise 3 times a week. Aerobic exercises include walking, biking, and swimming. Aerobic exercise along with a healthy diet can help you maintain a healthy weight.  Changes may also include quitting smoking. Medicines  Medicines are usually given if diet and lifestyle changes have failed to reduce your cholesterol to healthy levels.  Your health care provider may prescribe a statin  medicine. Statin medicines have been shown to reduce cholesterol, which can reduce the risk of heart disease. Follow these instructions at home: Eating and drinking If told by your health care provider:  Eat chicken (without skin), fish, veal, shellfish, ground [place] breast, and round or loin cuts of red meat.  Do not eat fried foods or fatty meats, such as hot dogs and salami.  Eat plenty of fruits, such as  apples.  Eat plenty of vegetables, such as broccoli, potatoes, and carrots.  Eat beans, peas, and lentils.  Eat grains such as barley, rice, couscous, and bulgur wheat.  Eat pasta without cream sauces.  Use skim or nonfat milk, and eat low-fat or nonfat yogurt and cheeses.  Do not eat or drink whole milk, cream, ice cream, egg yolks, or hard cheeses.  Do not eat stick margarine or tub margarines that contain trans fats (also called partially hydrogenated oils).  Do not eat saturated tropical oils, such as coconut oil and palm oil.  Do not eat cakes, cookies, crackers, or other baked goods that contain trans fats.  General instructions  Exercise as directed by your health care provider. Increase your activity level with activities such as gardening, walking, and taking the stairs.  Take over-the-counter and prescription medicines only as told by your health care provider.  Do not use any products that contain nicotine or tobacco, such as cigarettes and e-cigarettes. If you need help quitting, ask your health care provider.  Keep all follow-up visits as told by your health care provider. This is important. Contact a health care provider if:  You are struggling to maintain a healthy diet or weight.  You need help to start on an exercise program.  You need help to stop smoking. Get help right away if:  You have chest pain.  You have trouble breathing. This information is not intended to replace advice given to you by your health care provider. Make sure you discuss any questions you have with your health care provider. Document Revised: 04/23/2017 Document Reviewed: 10/19/2015 Elsevier Patient Education  [place].  Cholesterol Content in Foods Cholesterol is a waxy, fat-like substance that helps to carry fat in the blood. The body needs cholesterol in small amounts, but too much cholesterol can cause damage to the arteries and heart. Most people should eat less  than 200 milligrams (mg) of cholesterol a day. Foods with cholesterol  Cholesterol is found in animal-based foods, such as meat, seafood, and dairy. Generally, low-fat dairy and lean meats have less cholesterol than full-fat dairy and fatty meats. The milligrams of cholesterol per serving (mg per serving) of common cholesterol-containing foods are listed below. Meat and other proteins  Egg -- one large whole egg has 186 mg.  Veal shank -- 4 oz has 141 mg.  Lean ground [place] (93% lean) -- 4 oz has 118 mg.  Fat-trimmed lamb loin -- 4 oz has 106 mg.  Lean ground beef (90% lean) -- 4 oz has 100 mg.  Lobster -- 3.5 oz has 90 mg.  Pork loin chops -- 4 oz has 86 mg.  Canned salmon -- 3.5 oz has 83 mg.  Fat-trimmed beef top loin -- 4 oz has 78 mg.  Frankfurter -- 1 frank (3.5 oz) has 77 mg.  Crab -- 3.5 oz has 71 mg.  Roasted chicken without skin, white meat -- 4 oz has 66 mg.  Light bologna -- 2 oz has 45 mg.  Deli-cut [place] -- 2 oz has 31  mg.  Canned tuna -- 3.5 oz has 31 mg.  Berniece Salines -- 1 oz has 29 mg.  Oysters and mussels (raw) -- 3.5 oz has 25 mg.  Mackerel -- 1 oz has 22 mg.  Trout -- 1 oz has 20 mg.  Pork sausage -- 1 link (1 oz) has 17 mg.  Salmon -- 1 oz has 16 mg.  Tilapia -- 1 oz has 14 mg. Dairy  Soft-serve ice cream --  cup (4 oz) has 103 mg.  Whole-milk yogurt -- 1 cup (8 oz) has 29 mg.  Cheddar cheese -- 1 oz has 28 mg.  American cheese -- 1 oz has 28 mg.  Whole milk -- 1 cup (8 oz) has 23 mg.  2% milk -- 1 cup (8 oz) has 18 mg.  Cream cheese -- 1 tablespoon (Tbsp) has 15 mg.  Cottage cheese --  cup (4 oz) has 14 mg.  Low-fat (1%) milk -- 1 cup (8 oz) has 10 mg.  Sour cream -- 1 Tbsp has 8.5 mg.  Low-fat yogurt -- 1 cup (8 oz) has 8 mg.  Nonfat Greek yogurt -- 1 cup (8 oz) has 7 mg.  Half-and-half cream -- 1 Tbsp has 5 mg. Fats and oils  Cod liver oil -- 1 tablespoon (Tbsp) has 82 mg.  Butter -- 1 Tbsp has 15 mg.  Lard -- 1  Tbsp has 14 mg.  Bacon grease -- 1 Tbsp has 14 mg.  Mayonnaise -- 1 Tbsp has 5-10 mg.  Margarine -- 1 Tbsp has 3-10 mg. Exact amounts of cholesterol in these foods may vary depending on specific ingredients and brands. Foods without cholesterol Most plant-based foods do not have cholesterol unless you combine them with a food that has cholesterol. Foods without cholesterol include:  Grains and cereals.  Vegetables.  Fruits.  Vegetable oils, such as olive, canola, and sunflower oil.  Legumes, such as peas, beans, and lentils.  Nuts and seeds.  Egg whites. Summary  The body needs cholesterol in small amounts, but too much cholesterol can cause damage to the arteries and heart.  Most people should eat less than 200 milligrams (mg) of cholesterol a day. This information is not intended to replace advice given to you by your health care provider. Make sure you discuss any questions you have with your health care provider. Document Revised: 04/02/2017 Document Reviewed: 12/15/2016 Elsevier Patient Education  [place] con fstula mucosa, cuidados posteriores Colostomy With Mucous Fistula, Care After Esta hoja le brinda informacin sobre cmo cuidarse despus del procedimiento. Su mdico tambin podr darle instrucciones ms especficas. Comunquese con el mdico si tiene problemas o preguntas. Qu puedo esperar despus del procedimiento? Despus del procedimiento, es comn tener los siguientes sntomas:  Dolor en el abdomen.  Fatiga.  Meteorismo.  Enrojecimiento o hinchazn alrededor de las aberturas (estomas) en el abdomen.  Sangrado leve alrededor [employer]. Siga estas instrucciones en su casa: Medicamentos  Use los medicamentos de venta libre y los recetados solamente como se lo haya indicado el mdico.  Si le recetaron un antibitico, tmelo como se lo haya indicado el mdico. No deje de tomar el antibitico aunque comience a sentirse  mejor. Cuidado de la incisin   Siga las instrucciones del mdico acerca del cuidado de la incisin. Asegrese de hacer lo siguiente: ? Lvese las manos con agua y jabn antes de Quarry manager las vendas (vendaje). Use desinfectante para manos si no dispone de [place]. ?  Cambie el vendaje como se lo haya indicado el mdico. ? No retire los puntos (suturas), la goma para cerrar la piel o las tiras [place]. Es posible que estos cierres cutneos deban quedar puestos en la piel durante 2semanas o ms tiempo. Si los bordes de las tiras adhesivas empiezan a despegarse y Therapist, sports, puede recortar los que estn sueltos. No retire las tiras [facility] por completo a menos que el mdico se lo indique.  Controle la zona de la incisin todos los das para detectar signos de infeccin. Est atento a los siguientes signos: ? Enrojecimiento, hinchazn o dolor. ? Lquido o sangre. ? Calor. ? Pus o mal olor. Cuidado del estoma  Use una crema protectora como se lo haya indicado el mdico. Esta crema ayuda a prevenir la irritacin de la piel y ayuda a que las bolsas (bolsas colectoras para colostoma o bolsas de ostoma) se ajusten mejor.  Vace su bolsa de ostoma para heces cuando se haya llenado hasta una tercera parte. Cambie la bolsa cada 3 o 4 das, o con ms frecuencia si es necesario.  Cambie el vendaje de gasa o la bolsa de ostoma sobre la fstula mucosa segn la frecuencia que le haya indicado el mdico.  Limpie y seque la piel alrededor del estoma cada vez que cambie [place]. Para limpiar la zona del estoma: ? Use agua tibia y solo las soluciones de limpieza recomendadas por el mdico. ? Enjuague la zona del estoma con agua. ? Seque la zona con golpecitos suaves. Asegrese de [employer].  Use un talco o ungento para estomas sobre la piel solamente como se lo haya indicado el mdico. No use otros talcos, geles, toallitas o cremas en la piel.  [place] zona del estoma  para detectar signos de infeccin. Est atento a los siguientes signos: ? Aumento del enrojecimiento, de la hinchazn o del dolor. ? Lquido o mayor presencia de [place]. ? Pus o calor.  Mida los estomas regularmente y [employer]. Est atento a cualquier cambio.  No use prendas [employer] de [place]. Actividad   No levante ningn objeto que pese ms de 10libras (4.5kg) o que supere el lmite de peso que le hayan indicado, hasta que el mdico le diga que puede [place]. Es posible que deba obedecer esta limitacin de peso durante 4 a 6 semanas.  Retome sus actividades normales como se lo haya indicado el mdico. Debera poder retomar la [employer] de las actividades en un plazo aproximado de 2 semanas. La recuperacin completa puede demorar entre 4 y 6 semanas. Pregunte al mdico qu actividades son seguras para usted mientras se recupera. Conducir  No conduzca hasta que el mdico lo autorice.  No conduzca ni use maquinaria pesada mientras toma analgsicos recetados. Comida y bebida  Siga las instrucciones del mdico respecto de las restricciones en las comidas o las bebidas. Es posible que tenga que consumir una dieta con bajo contenido de fibra durante un mes despus de la [place].  Beba suficiente lquido como para Theatre manager la orina de color amarillo plido. Estilo de vida  Hable con el mdico si se siente deprimido o ansioso. Esta ciruga implica una adaptacin importante y el asesoramiento puede ayudarlo.  Considere la posibilidad de Chief Financial Officer en un grupo de apoyo para la ostoma.  No consuma ningn producto que contenga nicotina o tabaco, como cigarrillos y Psychologist, sport and exercise. Estos pueden retrasar la cicatrizacin de la herida. Si necesita ayuda para dejar de fumar, consulte al  mdico. Instrucciones generales  No tome baos de inmersin, no nade ni use el jacuzzi hasta que el mdico lo autorice. Pregntele al mdico si puede ducharse. Thurston Pounds solo le  permitan darse baos de [place].  Use medias de compresin como se lo haya indicado su mdico. Estas ayudan a evitar la formacin de cogulos de [place] y a reducir la hinchazn de las piernas.  Concurra a todas las visitas de seguimiento como se lo haya indicado el mdico. Esto es importante. Comunquese con un mdico si:  Tiene fiebre.  Tiene alguno de los estos problemas con la incisin: ? Enrojecimiento, hinchazn o dolor. ? Lquido o sangre.  Tiene alguno de [employer] con los estomas: ? Ardor o picazn en la piel. ? Aumento del enrojecimiento, de la hinchazn o del dolor. ? Lquido o mayor presencia de [place]. ? Cambios en el tamao, la forma o el color. ? No salen heces del estoma durante 2 das.  La incisin o los estomas se sienten inusualmente calientes al tacto.  Sale pus o mal olor de la incisin o los estomas.  Tiene clicos intensos en el abdomen.  Tiene nuseas o vmitos.  Tiene diarrea acuosa que dura ms de 2das.  Su dolor no se alivia con medicamentos. Solicite ayuda de inmediato si:  Electronics engineer.  Tiene dificultad para respirar.  Tiene dolor, hinchazn, enrojecimiento y calor en la pierna.  Siente dolor intenso.  El estoma sobresale y se obstruye. Resumen  La recuperacin completa puede demorar entre 4 y 6 semanas. Pregunte al mdico qu actividades son seguras para usted Corporate investment banker.  Siga las instrucciones del mdico acerca de cmo vaciar y limpiar las bolsas de ostoma. Cambie la bolsa de ostoma para heces cada 3 o 4 das, o con ms frecuencia si es necesario.  Mida los estomas regularmente y [employer]. Informe al mdico si nota algn cambio.  Comunquese con el mdico si tiene cualquier signo de infeccin, como enrojecimiento, hinchazn o dolor. Esta informacin no tiene Marine scientist el consejo del mdico. Asegrese de hacerle al mdico cualquier pregunta que tenga. Document Revised: 01/04/2019  Document Reviewed: 01/04/2019 Elsevier Patient Education  [address].

## 2020-01-15 ENCOUNTER — Telehealth (HOSPITAL_COMMUNITY): Payer: Self-pay | Admitting: *Deleted

## 2020-01-15 DIAGNOSIS — E785 Hyperlipidemia, unspecified: Secondary | ICD-10-CM | POA: Insufficient documentation

## 2020-01-15 DIAGNOSIS — I700 Atherosclerosis of aorta: Secondary | ICD-10-CM | POA: Insufficient documentation

## 2020-01-15 DIAGNOSIS — F900 Attention-deficit hyperactivity disorder, predominantly inattentive type: Secondary | ICD-10-CM

## 2020-01-15 DIAGNOSIS — N823 Fistula of vagina to large intestine: Secondary | ICD-10-CM | POA: Insufficient documentation

## 2020-01-15 MED ORDER — LEVOTHYROXINE SODIUM 25 MCG PO TABS: 25.0000 ug | ORAL_TABLET | Freq: Every day | ORAL | 3 refills | Status: DC

## 2020-01-15 MED ORDER — METHYLPHENIDATE HCL 5 MG PO TABS: ORAL_TABLET | ORAL | 0 refills | Status: DC

## 2020-01-15 NOTE — Telephone Encounter (Signed)
Pt called requesting refill of the Ritalin 5mg . Last filled on 12/15/19. Pt has an upcoming appointment on 02/09/20.

## 2020-01-15 NOTE — Telephone Encounter (Signed)
Done

## 2020-01-26 DIAGNOSIS — Z85038 Personal history of other malignant neoplasm of large intestine: Secondary | ICD-10-CM | POA: Diagnosis not present

## 2020-01-26 DIAGNOSIS — K219 Gastro-esophageal reflux disease without esophagitis: Secondary | ICD-10-CM | POA: Diagnosis not present

## 2020-01-26 DIAGNOSIS — R195 Other fecal abnormalities: Secondary | ICD-10-CM | POA: Diagnosis not present

## 2020-01-26 DIAGNOSIS — D131 Benign neoplasm of stomach: Secondary | ICD-10-CM | POA: Diagnosis not present

## 2020-01-26 DIAGNOSIS — R110 Nausea: Secondary | ICD-10-CM | POA: Diagnosis not present

## 2020-01-26 DIAGNOSIS — N823 Fistula of vagina to large intestine: Secondary | ICD-10-CM | POA: Diagnosis not present

## 2020-02-04 ENCOUNTER — Other Ambulatory Visit (HOSPITAL_COMMUNITY): Payer: Self-pay | Admitting: Psychiatry

## 2020-02-04 DIAGNOSIS — F331 Major depressive disorder, recurrent, moderate: Secondary | ICD-10-CM

## 2020-02-09 ENCOUNTER — Telehealth (INDEPENDENT_AMBULATORY_CARE_PROVIDER_SITE_OTHER): Payer: Medicare Other | Admitting: Psychiatry

## 2020-02-09 ENCOUNTER — Other Ambulatory Visit: Payer: Self-pay

## 2020-02-09 ENCOUNTER — Encounter (HOSPITAL_COMMUNITY): Payer: Self-pay | Admitting: Psychiatry

## 2020-02-09 DIAGNOSIS — F331 Major depressive disorder, recurrent, moderate: Secondary | ICD-10-CM

## 2020-02-09 DIAGNOSIS — F900 Attention-deficit hyperactivity disorder, predominantly inattentive type: Secondary | ICD-10-CM | POA: Diagnosis not present

## 2020-02-09 MED ORDER — TRINTELLIX 20 MG PO TABS: 20.0000 mg | ORAL_TABLET | ORAL | 2 refills | Status: DC

## 2020-02-09 MED ORDER — METHYLPHENIDATE HCL 5 MG PO TABS: ORAL_TABLET | ORAL | 0 refills | Status: DC

## 2020-02-09 MED ORDER — LAMOTRIGINE 200 MG PO TABS: 200.0000 mg | ORAL_TABLET | ORAL | 0 refills | Status: DC

## 2020-02-09 MED ORDER — TRAZODONE HCL 50 MG PO TABS: 50.0000 mg | ORAL_TABLET | Freq: Every day | ORAL | 0 refills | Status: DC

## 2020-02-09 NOTE — Progress Notes (Signed)
Virtual Visit via Telephone Note  I connected with [name] Full on 02/09/20 at  9:00 AM EDT by telephone and verified that I am speaking with the correct person using two identifiers.  Location: Patient: [place] Provider: Home office   I discussed the limitations, risks, security and privacy concerns of performing an evaluation and management service by telephone and the availability of in person appointments. I also discussed with the patient that there may be a patient responsible charge related to this service. The patient expressed understanding and agreed to proceed.   History of Present Illness: Patient is evaluated by phone session.  Currently she is in [place] at [facility] visiting her husband's brother.  Patient told medicine is working good and she denies any side effects.  She is taking the Ritalin only 1 tablet and that helps her focus, attention and multitasking.  Recently she had a blood work and her LDL was slightly increased.  Patient does not want to change the medication since it is working well.  Her husband is also doing after knee surgery and hoping to have right knee surgery in few months.  Patient has no tremors, shakes or any EPS.  She denies any crying spells or any feeling of hopelessness or worthlessness.  Her sleep is good.  She like to keep her current medication.  Past Psychiatric History: H/Omania, irritability, impulsive behavior and overdose at age 74.H/O inpatient twice. Hadpsychological testingand diagnosed ADD.Took Concerta, (Adderall, Vyvansemadeanxious)Wellbutrin, Prozac, lithium, Effexor, Lexapro, Abilify and Cymbalta. Best whengiven female hormone to help endometriosis. Had ECT, did not work. TMS helped.No history of psychosis.  Recent Results (from the past 2160 hour(s))  I-STAT creatinine     Status: Abnormal   Collection Time: 01/03/20 10:08 AM  Result Value Ref Range   Creatinine, Ser 1.10 (H) 0.44 - 1.00 mg/dL  CEA      Status: None   Collection Time: 01/05/20 12:43 PM  Result Value Ref Range   CEA 2.1 0.0 - 4.7 ng/mL    Comment: (NOTE)                             Nonsmokers          <3.9                             Smokers             <5.6 Roche Diagnostics Electrochemiluminescence Immunoassay (ECLIA) Values obtained with different assay methods or kits cannot be used interchangeably.  Results cannot be interpreted as absolute evidence of the presence or absence of malignant disease. Performed At: [facility] [place], [place] [id number] Rush Farmer MD WI:[id number]   Comprehensive metabolic panel     Status: Abnormal   Collection Time: 01/05/20 12:43 PM  Result Value Ref Range   Sodium 139 135 - 145 mmol/L   Potassium 3.8 3.5 - 5.1 mmol/L   Chloride 103 98 - 111 mmol/L   CO2 27 22 - 32 mmol/L   Glucose, Bld 114 (H) 70 - 99 mg/dL    Comment: Glucose reference range applies only to samples taken after fasting for at least 8 hours.   BUN 10 8 - 23 mg/dL   Creatinine, Ser 0.90 0.44 - 1.00 mg/dL   Calcium 9.0 8.9 - 10.3 mg/dL   Total Protein 6.7 6.5 - 8.1 g/dL  Albumin 3.9 3.5 - 5.0 g/dL   AST 19 15 - 41 U/L   ALT 18 0 - 44 U/L   Alkaline Phosphatase 63 38 - 126 U/L   Total Bilirubin 0.5 0.3 - 1.2 mg/dL   GFR calc non Af Amer >60 >60 mL/min   GFR calc Af Amer >60 >60 mL/min   Anion gap 9 5 - 15    Comment: Performed at [facility], [address] [place], [place] [address]  CBC with Differential/Platelet     Status: None   Collection Time: 01/05/20 12:43 PM  Result Value Ref Range   WBC 8.1 4.0 - 10.5 K/uL   RBC 4.20 3.87 - 5.11 MIL/uL   Hemoglobin 13.2 12.0 - 15.0 g/dL   HCT 37.9 36 - 46 %   MCV 90.2 80.0 - 100.0 fL   MCH 31.4 26.0 - 34.0 pg   MCHC 34.8 30.0 - 36.0 g/dL   RDW 12.6 11.5 - 15.5 %   Platelets 234 150 - 400 K/uL   nRBC 0.0 0.0 - 0.2 %   Neutrophils Relative % 51 %   Neutro Abs 4.2 1.7 - 7.7 K/uL   Lymphocytes Relative 40 %   Lymphs  Abs 3.3 0.7 - 4.0 K/uL   Monocytes Relative 7 %   Monocytes Absolute 0.5 0.1 - 1.0 K/uL   Eosinophils Relative 1 %   Eosinophils Absolute 0.0 0 - 0 K/uL   Basophils Relative 1 %   Basophils Absolute 0.1 0 - 0 K/uL   Immature Granulocytes 0 %   Abs Immature Granulocytes 0.01 0.00 - 0.07 K/uL    Comment: Performed at [facility], [address] [place], [place] [address]  CBC w/Diff     Status: Abnormal   Collection Time: 01/09/20  8:29 AM  Result Value Ref Range   WBC 8.3 4.0 - 10.5 K/uL   RBC 4.40 3.87 - 5.11 Mil/uL   Hemoglobin 13.7 12.0 - 15.0 g/dL   HCT 40.7 36 - 46 %   MCV 92.4 78.0 - 100.0 fl   MCHC 33.8 30.0 - 36.0 g/dL   RDW 13.1 11.5 - 15.5 %   Platelets 268.0 150 - 400 K/uL   Neutrophils Relative % 47.0 43 - 77 %   Lymphocytes Relative 47.3 (H) 12 - 46 %   Monocytes Relative 4.8 3 - 12 %   Eosinophils Relative 0.5 0 - 5 %   Basophils Relative 0.4 0 - 3 %   Neutro Abs 3.9 1.4 - 7.7 K/uL   Lymphs Abs 3.9 0.7 - 4.0 K/uL   Monocytes Absolute 0.4 0.1 - 1.0 K/uL   Eosinophils Absolute 0.0 0 - 0 K/uL   Basophils Absolute 0.0 0 - 0 K/uL  Lipid panel     Status: Abnormal   Collection Time: 01/09/20  8:29 AM  Result Value Ref Range   Cholesterol 218 (H) 0 - 200 mg/dL    Comment: ATP III Classification       Desirable:  < 200 mg/dL               Borderline High:  200 - 239 mg/dL          High:  > = 240 mg/dL   Triglycerides 90.0 0 - 149 mg/dL    Comment: Normal:  <150 mg/dLBorderline High:  150 - 199 mg/dL   HDL 70.50 >39.00 mg/dL   VLDL 18.0 0.0 - 40.0 mg/dL   LDL Cholesterol 129 (H) 0 -  99 mg/dL   Total CHOL/HDL Ratio 3     Comment:                Men          Women1/2 Average Risk     3.4          3.3Average Risk          5.0          4.42X Average Risk          9.6          7.13X Average Risk          15.0          11.0                       NonHDL 147.28     Comment: NOTE:  Non-HDL goal should be 30 mg/dL higher than patient's LDL goal (i.e. LDL goal of < 70  mg/dL, would have non-HDL goal of < 100 mg/dL)  TSH     Status: None   Collection Time: 01/09/20  8:29 AM  Result Value Ref Range   TSH 4.12 0.35 - 4.50 uIU/mL  Comprehensive metabolic panel     Status: Abnormal   Collection Time: 01/09/20  8:29 AM  Result Value Ref Range   Sodium 140 135 - 145 mEq/L   Potassium 3.8 3.5 - 5.1 mEq/L   Chloride 102 96 - 112 mEq/L   CO2 29 19 - 32 mEq/L   Glucose, Bld 84 70 - 99 mg/dL   BUN 10 6 - 23 mg/dL   Creatinine, Ser 0.98 0.40 - 1.20 mg/dL   Total Bilirubin 0.6 0.2 - 1.2 mg/dL   Alkaline Phosphatase 62 39 - 117 U/L   AST 15 0 - 37 U/L   ALT 15 0 - 35 U/L   Total Protein 6.9 6.0 - 8.3 g/dL   Albumin 4.3 3.5 - 5.2 g/dL   GFR 55.69 (L) >60.00 mL/min   Calcium 9.8 8.4 - 10.5 mg/dL    Psychiatric Specialty Exam: Physical Exam  Review of Systems  Weight 156 lb (70.8 kg).There is no height or weight on file to calculate BMI.  General Appearance: NA  Eye Contact:  NA  Speech:  Normal Rate  Volume:  Normal  Mood:  Euthymic  Affect:  NA  Thought Process:  Goal Directed  Orientation:  Full (Time, Place, and Person)  Thought Content:  Logical  Suicidal Thoughts:  No  Homicidal Thoughts:  No  Memory:  Immediate;   Good Recent;   Good Remote;   Good  Judgement:  Good  Insight:  Present  Psychomotor Activity:  NA  Concentration:  Concentration: Good and Attention Span: Good  Recall:  Good  Fund of Knowledge:  Good  Language:  Good  Akathisia:  No  Handed:  Right  AIMS (if indicated):     Assets:  Communication Skills Desire for Improvement Housing Resilience Social Support Transportation  ADL's:  Intact  Cognition:  WNL  Sleep:   ok      Assessment and Plan: Major depressive disorder, recurrent.  ADD, inattentive type.  Patient is taking Ritalin 5 mg it is helping her focus and attention.  Continue Trintellix 20 mg in the morning, Lamictal 200 mg in the morning and trazodone at bedtime.  Recommended to call [name] back if she has  any question or any concern.  I reviewed blood work results.  Follow-up in 3 months.  Patient has no rash or any itching.  Follow Up Instructions:    I discussed the assessment and treatment plan with the patient. The patient was provided an opportunity to ask questions and all were answered. The patient agreed with the plan and demonstrated an understanding of the instructions.   The patient was advised to call back or seek an in-person evaluation if the symptoms worsen or if the condition fails to improve as anticipated.  I provided 18 minutes of non-face-to-face time during this encounter.   Kathlee Nations, MD

## 2020-02-21 ENCOUNTER — Ambulatory Visit (INDEPENDENT_AMBULATORY_CARE_PROVIDER_SITE_OTHER): Payer: Medicare Other

## 2020-02-21 VITALS — Ht 62.0 in | Wt 156.0 lb

## 2020-02-21 DIAGNOSIS — Z0000 Encounter for general adult medical examination without abnormal findings: Secondary | ICD-10-CM

## 2020-02-21 NOTE — Patient Instructions (Addendum)
[name] , Thank you for taking time to come for your Medicare Wellness Visit. I appreciate your ongoing commitment to your health goals. Please review the following plan we discussed and let me know if I can assist you in the future.   These are the goals we discussed: Goals    . Follow up with pcp as needed       This is a list of the screening recommended for you and due dates:  Health Maintenance  Topic Date Due  . Mammogram  04/12/2021  . Colon Cancer Screening  01/18/2028  . Tetanus Vaccine  09/06/2028  . Flu Shot  Completed  . DEXA scan (bone density measurement)  Completed  . COVID-19 Vaccine  Completed  .  Hepatitis C: One time screening is recommended by Center for Disease Control  (CDC) for  adults born from [birth year] through 1963.   Completed  . Pneumonia vaccines  Completed    Immunizations Immunization History  Administered Date(s) Administered  . DTaP 02/15/2014  . Fluad Quad(high Dose 65+) 01/12/2020  . Influenza Split 03/08/2017  . Influenza-Unspecified 02/01/2018, 01/30/2019  . PFIZER SARS-COV-2 Vaccination 05/22/2019, 06/13/2019  . Pneumococcal Conjugate-13 02/15/2014  . Pneumococcal Polysaccharide-23 12/25/2009, 05/31/2018  . Tdap 08/10/2018, 09/07/2018  . Zoster Recombinat (Shingrix) 12/09/2018, 02/15/2019   Keep all routine maintenance appointments.   Follow up 06/18/20 @ 1:30  Advanced directives: completed, on file  Conditions/risks identified: none new  Follow up in one year for your annual wellness visit.   Preventive Care 72 Years and Older, Female Preventive care refers to lifestyle choices and visits with your health care provider that can promote health and wellness. What does preventive care include?  A yearly physical exam. This is also called an annual well check.  Dental exams once or twice a year.  Routine eye exams. Ask your health care provider how often you should have your eyes checked.  Personal lifestyle choices,  including:  Daily care of your teeth and gums.  Regular physical activity.  Eating a healthy diet.  Avoiding tobacco and drug use.  Limiting alcohol use.  Practicing safe sex.  Taking low-dose aspirin every day.  Taking vitamin and mineral supplements as recommended by your health care provider. What happens during an annual well check? The services and screenings done by your health care provider during your annual well check will depend on your age, overall health, lifestyle risk factors, and family history of disease. Counseling  Your health care provider may ask you questions about your:  Alcohol use.  Tobacco use.  Drug use.  Emotional well-being.  Home and relationship well-being.  Sexual activity.  Eating habits.  History of falls.  Memory and ability to understand (cognition).  Work and work Statistician.  Reproductive health. Screening  You may have the following tests or measurements:  Height, weight, and BMI.  Blood pressure.  Lipid and cholesterol levels. These may be checked every 5 years, or more frequently if you are over 30 years old.  Skin check.  Lung cancer screening. You may have this screening every year starting at age 72 if you have a 30-pack-year history of smoking and currently smoke or have quit within the past 15 years.  Fecal occult blood test (FOBT) of the stool. You may have this test every year starting at age 72.  Flexible sigmoidoscopy or colonoscopy. You may have a sigmoidoscopy every 5 years or a colonoscopy every 10 years starting at age 72.  Hepatitis C blood  test.  Hepatitis B blood test.  Sexually transmitted disease (STD) testing.  Diabetes screening. This is done by checking your blood sugar (glucose) after you have not eaten for a while (fasting). You may have this done every 1-3 years.  Bone density scan. This is done to screen for osteoporosis. You may have this done starting at age 72.  Mammogram. This  may be done every 1-2 years. Talk to your health care provider about how often you should have regular mammograms. Talk with your health care provider about your test results, treatment options, and if necessary, the need for more tests. Vaccines  Your health care provider may recommend certain vaccines, such as:  Influenza vaccine. This is recommended every year.  Tetanus, diphtheria, and acellular pertussis (Tdap, Td) vaccine. You may need a Td booster every 10 years.  Zoster vaccine. You may need this after age 48.  Pneumococcal 13-valent conjugate (PCV13) vaccine. One dose is recommended after age 34.  Pneumococcal polysaccharide (PPSV23) vaccine. One dose is recommended after age 23. Talk to your health care provider about which screenings and vaccines you need and how often you need them. This information is not intended to replace advice given to you by your health care provider. Make sure you discuss any questions you have with your health care provider. Document Released: 05/17/2015 Document Revised: 01/08/2016 Document Reviewed: 02/19/2015 Elsevier Interactive Patient Education  2017 [place] Prevention in the Home Falls can cause injuries. They can happen to people of all ages. There are many things you can do to make your home safe and to help prevent falls. What can I do on the outside of my home?  Regularly fix the edges of walkways and driveways and fix any cracks.  Remove anything that might make you trip as you walk through a door, such as a raised step or threshold.  Trim any bushes or trees on the path to your home.  Use bright outdoor lighting.  Clear any walking paths of anything that might make someone trip, such as rocks or tools.  Regularly check to see if handrails are loose or broken. Make sure that both sides of any steps have handrails.  Any raised decks and porches should have guardrails on the edges.  Have any leaves, snow, or ice cleared  regularly.  Use sand or salt on walking paths during winter.  Clean up any spills in your garage right away. This includes oil or grease spills. What can I do in the bathroom?  Use night lights.  Install grab bars by the toilet and in the tub and shower. Do not use towel bars as grab bars.  Use non-skid mats or decals in the tub or shower.  If you need to sit down in the shower, use a plastic, non-slip stool.  Keep the floor dry. Clean up any water that spills on the floor as soon as it happens.  Remove soap buildup in the tub or shower regularly.  Attach bath mats securely with double-sided non-slip rug tape.  Do not have throw rugs and other things on the floor that can make you trip. What can I do in the bedroom?  Use night lights.  Make sure that you have a light by your bed that is easy to reach.  Do not use any sheets or blankets that are too big for your bed. They should not hang down onto the floor.  Have a firm chair that has side arms. You can  use this for support while you get dressed.  Do not have throw rugs and other things on the floor that can make you trip. What can I do in the kitchen?  Clean up any spills right away.  Avoid walking on wet floors.  Keep items that you use a lot in easy-to-reach places.  If you need to reach something above you, use a strong step stool that has a grab bar.  Keep electrical cords out of the way.  Do not use floor polish or wax that makes floors slippery. If you must use wax, use non-skid floor wax.  Do not have throw rugs and other things on the floor that can make you trip. What can I do with my stairs?  Do not leave any items on the stairs.  Make sure that there are handrails on both sides of the stairs and use them. Fix handrails that are broken or loose. Make sure that handrails are as long as the stairways.  Check any carpeting to make sure that it is firmly attached to the stairs. Fix any carpet that is loose  or worn.  Avoid having throw rugs at the top or bottom of the stairs. If you do have throw rugs, attach them to the floor with carpet tape.  Make sure that you have a light switch at the top of the stairs and the bottom of the stairs. If you do not have them, ask someone to add them for you. What else can I do to help prevent falls?  Wear shoes that:  Do not have high heels.  Have rubber bottoms.  Are comfortable and fit you well.  Are closed at the toe. Do not wear sandals.  If you use a stepladder:  Make sure that it is fully opened. Do not climb a closed stepladder.  Make sure that both sides of the stepladder are locked into place.  Ask someone to hold it for you, if possible.  Clearly mark and make sure that you can see:  Any grab bars or handrails.  First and last steps.  Where the edge of each step is.  Use tools that help you move around (mobility aids) if they are needed. These include:  Canes.  Walkers.  Scooters.  Crutches.  Turn on the lights when you go into a dark area. Replace any light bulbs as soon as they burn out.  Set up your furniture so you have a clear path. Avoid moving your furniture around.  If any of your floors are uneven, fix them.  If there are any pets around you, be aware of where they are.  Review your medicines with your doctor. Some medicines can make you feel dizzy. This can increase your chance of falling. Ask your doctor what other things that you can do to help prevent falls. This information is not intended to replace advice given to you by your health care provider. Make sure you discuss any questions you have with your health care provider. Document Released: 02/14/2009 Document Revised: 09/26/2015 Document Reviewed: 05/25/2014 Elsevier Interactive Patient Education  2017 [employer].

## 2020-02-21 NOTE — Progress Notes (Signed)
Subjective:   [name] is a 74 y.o. female who presents for Medicare Annual (Subsequent) preventive examination.  Review of Systems    No ROS.  Medicare Wellness Virtual Visit.   Cardiac Risk Factors include: advanced age (>68men, >70 women)     Objective:    Today's Vitals   02/21/20 1034  Weight: 156 lb (70.8 kg)  Height: 5\' 2"  (1.575 m)   Body mass index is 28.53 kg/m.  Advanced Directives 02/21/2020 01/05/2020 01/05/2020 06/30/2019 04/04/2019 02/20/2019 12/27/2018  Does Patient Have a Medical Advance Directive? Yes Yes Yes No Yes Yes Yes  Type of Paramedic of [place];Living will [place];Living will [place];Living will  Does patient want to make changes to medical advance directive? No - Patient declined - - - - No - Patient declined -  Copy of [place] in Chart? No - copy requested Yes - validated most recent copy scanned in chart (See row information) - - - Yes - validated most recent copy scanned in chart (See row information) -  Would patient like information on creating a medical advance directive? - - - - - - -  Some encounter information is confidential and restricted. Go to Review Flowsheets activity to see all data.    Current Medications (verified) Outpatient Encounter Medications as of 02/21/2020  Medication Sig  . calcium-vitamin D (OSCAL WITH D) 500-200 MG-UNIT tablet Take 2 tablets by mouth 2 (two) times daily.  . cetirizine (ZYRTEC) 10 MG tablet Take 10 mg by mouth daily as needed for allergies.   Marland Kitchen estradiol (ESTRACE) 0.5 MG tablet Take 1 tablet (0.5 mg total) by mouth daily.  Marland Kitchen lamoTRIgine (LAMICTAL) 200 MG tablet Take 1 tablet (200 mg total) by mouth every morning.  Marland Kitchen levothyroxine (SYNTHROID) 25 MCG tablet Take 1 tablet (25 mcg total) by mouth daily before breakfast. 30 minutes  .  methylphenidate (RITALIN) 5 MG tablet Take one tab daily  . Multiple Vitamins-Minerals (MULTIVITAMIN WITH MINERALS) tablet Take 1 tablet by mouth daily.  Marland Kitchen omeprazole (PRILOSEC) 20 MG capsule Take 1 capsule (20 mg total) by mouth daily. 30 minutes before lunch wait 3-4 hours after thyroid pill before taking  . ondansetron (ZOFRAN-ODT) 4 MG disintegrating tablet Take 1 tablet (4 mg total) by mouth 2 (two) times daily as needed for nausea or vomiting. AND PRN if this continues see gastroenterology  . Probiotic Product (PROBIOTIC PO) Take by mouth.  . traZODone (DESYREL) 50 MG tablet Take 1 tablet (50 mg total) by mouth at bedtime.  . TRINTELLIX 20 MG TABS tablet Take 1 tablet (20 mg total) by mouth every morning.  . [DISCONTINUED] prochlorperazine (COMPAZINE) 10 MG tablet Take 1 tablet (10 mg total) every 6 (six) hours as needed by mouth (Nausea or vomiting).   No facility-administered encounter medications on file as of 02/21/2020.    Allergies (verified) Other and Codeine   History: Past Medical History:  Diagnosis Date  . Allergy   . Anemia   . Carcinoma ([place])   . Colon cancer ([place]) 01/2017  . Colon polyps   . Diverticulitis    01/2018 abscess hosp. 01/2018  . Endometriosis   . Fibromyalgia   . Gastric polyps   . GERD (gastroesophageal reflux disease)   . Hemorrhoids   . Hypothyroidism   . Iron deficiency anemia 03/08/2017  . Major depressive disorder, recurrent episode, mild ([place])  Dr. Adele Schilder in [place] h/o ECT x 6 x and hosp x 2 and transcranial magnetic treatment   . Miscarriage    x2  . PONV (postoperative nausea and vomiting)   . Skin cancer    arm, thigh and chest Dr. Evorn Gong   . Sleep apnea    USES CPAP  . UTI (urinary tract infection)    Past Surgical History:  Procedure Laterality Date  . ABDOMINAL HYSTERECTOMY  1990   endometriosis  . BREAST BIOPSY Left 2004   benign  . CATARACT EXTRACTION W/PHACO Right 10/26/2018   Procedure: CATARACT EXTRACTION PHACO AND  INTRAOCULAR LENS PLACEMENT ([place]) RIGHT TORIC LENS;  Surgeon: Leandrew Koyanagi, MD;  Location: [facility];  Service: Ophthalmology;  Laterality: Right;  sleep apnea  . CATARACT EXTRACTION, BILATERAL Bilateral 05/2012  . CHOLECYSTECTOMY  2001  . COLONOSCOPY WITH PROPOFOL N/A 12/28/2016   Procedure: COLONOSCOPY WITH PROPOFOL;  Surgeon: Manya Silvas, MD;  Location: [facility] ENDOSCOPY;  Service: Endoscopy;  Laterality: N/A;  . COLONOSCOPY WITH PROPOFOL N/A 01/17/2018   Procedure: COLONOSCOPY WITH PROPOFOL;  Surgeon: Manya Silvas, MD;  [facility] END[facility]COPY;  Service: Endoscopy;  Laterality: N/A;  . ESOPHAGOGASTRODUODENOSCOPY (EGD) WITH PROPOFOL N/A 12/28/2016   Procedure: ESOPHAGOGASTRODUODENOSCOPY (EGD) WITH PROPOFOL;  Surgeon: Manya Silvas, MD;  Location: [facility] ENDOSCOPY;  Service: Endoscopy;  Laterality: N/A;  . EYE SURGERY     lens left eye, cataract right eye   . LAPAROSCOPIC RIGHT COLECTOMY Right 01/21/2017   Procedure: LAPAROSCOPIC ASSISTED RIGHT COLECTOMY;  Surgeon: Robert Bellow, MD;  Location: [facility] ORS;  Service: General;  Laterality: Right;  . TONSILLECTOMY     Family History  Problem Relation Age of Onset  . Depression Brother   . Alcohol abuse Brother   . Suicidality Brother   . Stroke Mother   . Hyperlipidemia Mother   . Hypertension Mother   . Clotting disorder Brother   . Cancer Brother        MDS  . Colon polyps Father   . Melanoma Sister   . Cancer Sister        skin MM  . Stroke Maternal Grandfather   . Melanoma Nephew   . Bipolar disorder Neg Hx    Social History   Socioeconomic History  . Marital status: Married    Spouse name: Not on file  . Number of children: Not on file  . Years of education: Not on file  . Highest education level: Not on file  Occupational History  . Not on file  Tobacco Use  . Smoking status: Never Smoker  . Smokeless tobacco: Never Used  Vaping Use  . Vaping Use: Never used  Substance and Sexual  Activity  . Alcohol use: Not Currently    Alcohol/week: 0.0 standard drinks    Comment:    . Drug use: No  . Sexual activity: Yes    Partners: Male    Birth control/protection: None  Other Topics Concern  . 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 Difficulty of Paying Living Expenses: Not hard at all  Food Insecurity: No Food Insecurity  . Worried About Charity fundraiser in the Last Year: Never true  .  Ran Out of Food in the Last Year: Never true  Transportation Needs: No Transportation Needs  . Lack of Transportation (Medical): No  . Lack of Transportation (Non-Medical): No  Physical Activity:   . Days of Exercise per Week: Not on file  . Minutes of Exercise per Session: Not on file  Stress:   . Feeling of Stress : Not on file  Social Connections:   . Frequency of Communication with Friends and Family: Not on file  . Frequency of Social Gatherings with Friends and Family: Not on file  . Attends Religious Services: Not on file  . Active Member of Clubs or Organizations: Not on file  . Attends Archivist Meetings: Not on file  . Marital Status: Not on file    Tobacco Counseling Counseling given: Not Answered   Clinical Intake:  Pre-visit preparation completed: Yes        Diabetes: No  How often do you need to have someone help you when you read instructions, pamphlets, or other written materials from your doctor or pharmacy?: 1 - Never  Interpreter Needed?: No      Activities of Daily Living In your present state of health, do you have any difficulty performing the following activities: 02/21/2020  Hearing? N  Vision? N  Difficulty concentrating or making decisions? N  Walking or climbing stairs? N  Dressing or bathing? N  Doing  errands, shopping? N  Preparing Food and eating ? N  Using the Toilet? N  In the past six months, have you accidently leaked urine? N  Do you have problems with loss of bowel control? N  Managing your Medications? N  Managing your Finances? N  Housekeeping or managing your Housekeeping? N  Some recent data might be hidden    Patient Care Team: McLean-Scocuzza, Nino Glow, MD as PCP - General (Internal Medicine) Manya Silvas, MD (Inactive) (Gastroenterology) Bary Castilla Forest Gleason, MD (General Surgery) Clent Jacks, RN as Registered Nurse  Indicate any recent Medical Services you may have received from other than [facility] providers in the past year (date may be approximate).     Assessment:   This is a routine wellness examination for Margarethe.  I connected with Juley today by telephone and verified that I am speaking with the correct person using two identifiers. Location patient: home Location provider: work Persons participating in the virtual visit: patient, Marine scientist.    I discussed the limitations, risks, security and privacy concerns of performing an evaluation and management service by telephone and the availability of in person appointments. The patient expressed understanding and verbally consented to this telephonic visit.    Interactive audio and video telecommunications were attempted between this provider and patient, however failed, due to patient having technical difficulties OR patient did not have access to video capability.  We continued and completed visit with audio only.  Some vital signs may be absent or patient reported.   Hearing/Vision screen  Hearing Screening   125Hz  250Hz  500Hz  1000Hz  2000Hz  3000Hz  4000Hz  6000Hz  8000Hz   Right ear:           Left ear:           Comments: Patient is able to hear conversational tones without difficulty.  No issues reported.  Vision Screening Comments: Cataract extraction, bilateral Visual acuity not assessed, virtual  visit.       Dietary issues and exercise activities discussed:    Healthy diet Good water intake  Goals    . Follow  up with pcp as needed      Depression Screen PHQ 2/9 Scores 02/21/2020 01/12/2020 07/11/2019 02/20/2019 02/07/2019 03/22/2018 11/24/2017  PHQ - 2 Score 0 0 0 0 0 0 0  PHQ- 9 Score 0 0 - - - - -  Exception Documentation Other- indicate reason in comment box - - - - - -  Not completed Followed by psychiatrist every 6 month - - - - - -    Fall Risk Fall Risk  02/21/2020 01/12/2020 07/11/2019 02/20/2019 02/07/2019  Falls in the past year? 0 0 0 0 0  Comment - - - - -  Number falls in past yr: 0 0 - - -  Injury with Fall? - 0 - - -  Follow up Falls evaluation completed Falls evaluation completed - - -   Handrails in use when climbing stairs? Yes Home free of loose throw rugs in walkways, pet beds, electrical cords, etc? Yes  Adequate lighting in your home to reduce risk of falls? Yes   ASSISTIVE DEVICES UTILIZED TO PREVENT FALLS: Use of a cane, walker or w/c? No   TIMED UP AND GO: Was the test performed? No . Virtual visit.   Cognitive Function: Patient is alert and oriented x3.   MMSE - Mini Mental State Exam 11/24/2017  Orientation to time 5  Orientation to Place 5  Registration 3  Attention/ Calculation 2  Recall 3  Language- name 2 objects 2  Language- repeat 1  Language- follow 3 step command 3  Language- read & follow direction 1  Write a sentence 1  Copy design 1  Total score 27     6CIT Screen 02/20/2019  What Year? 0 points  What month? 0 points  What time? 0 points  Count back from 20 0 points  Months in reverse 0 points    Immunizations Immunization History  Administered Date(s) Administered  . DTaP 02/15/2014  . Fluad Quad(high Dose 65+) 01/12/2020  . Influenza Split 03/08/2017  . Influenza-Unspecified 02/01/2018, 01/30/2019  . PFIZER SARS-COV-2 Vaccination 05/22/2019, 06/13/2019  . Pneumococcal Conjugate-13 02/15/2014  .  Pneumococcal Polysaccharide-23 12/25/2009, 05/31/2018  . Tdap 08/10/2018, 09/07/2018  . Zoster Recombinat (Shingrix) 12/09/2018, 02/15/2019    Health Maintenance There are no preventive care reminders to display for this patient. Health Maintenance  Topic Date Due  . MAMMOGRAM  04/12/2021  . COLONOSCOPY  01/18/2028  . TETANUS/TDAP  09/06/2028  . INFLUENZA VACCINE  Completed  . DEXA SCAN  Completed  . COVID-19 Vaccine  Completed  . Hepatitis C Screening  Completed  . PNA vac Low Risk Adult  Completed   Dental Screening: Recommended annual dental exams for proper oral hygiene  Community Resource Referral / Chronic Care Management: CRR required this visit?  No   CCM required this visit?  No      Plan:   Keep all routine maintenance appointments.   Follow up 06/18/20 @ 1:30  I have personally reviewed and noted the following in the patient's chart:   . Medical and social history . Use of alcohol, tobacco or illicit drugs  . Current medications and supplements . Functional ability and status . Nutritional status . Physical activity . Advanced directives . List of other physicians . Hospitalizations, surgeries, and ER visits in previous 12 months . Vitals . Screenings to include cognitive, depression, and falls . Referrals and appointments  In addition, I have reviewed and discussed with patient certain preventive protocols, quality metrics, and best practice recommendations. A written personalized  care plan for preventive services as well as general preventive health recommendations were provided to patient via mychart.     Varney Biles, LPN   [id number]

## 2020-02-23 ENCOUNTER — Encounter: Payer: Self-pay | Admitting: Internal Medicine

## 2020-03-01 DIAGNOSIS — K5730 Diverticulosis of large intestine without perforation or abscess without bleeding: Secondary | ICD-10-CM | POA: Diagnosis not present

## 2020-03-19 ENCOUNTER — Ambulatory Visit: Payer: Medicare Other | Admitting: Internal Medicine

## 2020-03-21 ENCOUNTER — Encounter: Payer: Self-pay | Admitting: Internal Medicine

## 2020-03-21 ENCOUNTER — Telehealth (HOSPITAL_COMMUNITY): Payer: Self-pay | Admitting: *Deleted

## 2020-03-21 DIAGNOSIS — F900 Attention-deficit hyperactivity disorder, predominantly inattentive type: Secondary | ICD-10-CM

## 2020-03-21 DIAGNOSIS — N959 Unspecified menopausal and perimenopausal disorder: Secondary | ICD-10-CM

## 2020-03-21 MED ORDER — ESTRADIOL 0.5 MG PO TABS: 0.5000 mg | ORAL_TABLET | Freq: Every day | ORAL | 1 refills | Status: DC

## 2020-03-21 MED ORDER — METHYLPHENIDATE HCL 5 MG PO TABS: ORAL_TABLET | ORAL | 0 refills | Status: DC

## 2020-03-21 NOTE — Telephone Encounter (Signed)
Done

## 2020-03-21 NOTE — Telephone Encounter (Signed)
Pt called requesting refill of the Ritalin. Pt has an upcoming appointment on 05/10/19.

## 2020-04-18 ENCOUNTER — Telehealth (HOSPITAL_COMMUNITY): Payer: Self-pay | Admitting: *Deleted

## 2020-04-18 DIAGNOSIS — F900 Attention-deficit hyperactivity disorder, predominantly inattentive type: Secondary | ICD-10-CM

## 2020-04-18 MED ORDER — METHYLPHENIDATE HCL 5 MG PO TABS: ORAL_TABLET | ORAL | 0 refills | Status: DC

## 2020-04-18 NOTE — Telephone Encounter (Signed)
Pt called requesting a refill of the Ritalin. Next appointment is on 05/09/20.

## 2020-04-18 NOTE — Telephone Encounter (Signed)
Done

## 2020-05-09 ENCOUNTER — Telehealth (HOSPITAL_COMMUNITY): Payer: Medicare Other | Admitting: Psychiatry

## 2020-05-10 ENCOUNTER — Encounter (HOSPITAL_COMMUNITY): Payer: Self-pay | Admitting: Psychiatry

## 2020-05-10 ENCOUNTER — Telehealth (INDEPENDENT_AMBULATORY_CARE_PROVIDER_SITE_OTHER): Payer: Medicare Other | Admitting: Psychiatry

## 2020-05-10 ENCOUNTER — Other Ambulatory Visit: Payer: Self-pay

## 2020-05-10 DIAGNOSIS — F900 Attention-deficit hyperactivity disorder, predominantly inattentive type: Secondary | ICD-10-CM | POA: Diagnosis not present

## 2020-05-10 DIAGNOSIS — F331 Major depressive disorder, recurrent, moderate: Secondary | ICD-10-CM | POA: Diagnosis not present

## 2020-05-10 MED ORDER — LAMOTRIGINE 200 MG PO TABS: 200.0000 mg | ORAL_TABLET | ORAL | 0 refills | Status: DC

## 2020-05-10 MED ORDER — TRAZODONE HCL 50 MG PO TABS: 50.0000 mg | ORAL_TABLET | Freq: Every day | ORAL | 0 refills | Status: DC

## 2020-05-10 MED ORDER — TRINTELLIX 20 MG PO TABS: 20.0000 mg | ORAL_TABLET | ORAL | 2 refills | Status: DC

## 2020-05-10 MED ORDER — METHYLPHENIDATE HCL 5 MG PO TABS: ORAL_TABLET | ORAL | 0 refills | Status: DC

## 2020-05-10 NOTE — Progress Notes (Signed)
Virtual Visit via Telephone Note  I connected with [name] on 05/10/20 at  8:40 AM EST by telephone and verified that I am speaking with the correct person using two identifiers.  Location: Patient: home Provider: home office   I discussed the limitations, risks, security and privacy concerns of performing an evaluation and management service by telephone and the availability of in person appointments. I also discussed with the patient that there may be a patient responsible charge related to this service. The patient expressed understanding and agreed to proceed.   History of Present Illness: Patient is evaluated on the phone.  She is on the phone by herself.  She had a very good Christmas as she was able to see multiple family members.  She is more active, able to do multitasking.  She feels relieved because she is able to finish her task.  She denies any crying spells or any feeling of hopelessness.  She wakes up in the morning and sometimes she takes naps during the day but otherwise she feels the medicine working very well.  She has no tremors, shakes or any EPS.  She lost few pounds since the last visit.  She is watching her calorie intake.  She has no concern from the medication.  Next week she is going to [facility] for her husband's right knee surgery.  Patient is hoping the surgeon who started new technique for surgery helps quick recovery and does not need prolonged rehabilitation.  Patient takes Ritalin every day.  She is also taking Lamictal Trintellix and trazodone.  She has no rash.    Past Psychiatric History: H/Omania, irritability, impulsive behavior and overdose at age 20.H/O inpatient twice. Hadpsychological testingand diagnosed ADD.Took Concerta, (Adderall, Vyvansemadeanxious)Wellbutrin, Prozac, lithium, Effexor, Lexapro, Abilify and Cymbalta. Best whengiven female hormone to help endometriosis. Had ECT, did not work. TMS helped.No history of  psychosis.   Psychiatric Specialty Exam: Physical Exam  Review of Systems  Weight 154 lb (69.9 kg).Body mass index is 28.17 kg/m.  General Appearance: NA  Eye Contact:  NA  Speech:  Clear and Coherent and Normal Rate  Volume:  Normal  Mood:  Euthymic  Affect:  NA  Thought Process:  Goal Directed  Orientation:  Full (Time, Place, and Person)  Thought Content:  WDL  Suicidal Thoughts:  No  Homicidal Thoughts:  No  Memory:  Immediate;   Good Recent;   Good Remote;   Good  Judgement:  Intact  Insight:  Good  Psychomotor Activity:  NA  Concentration:  Concentration: Good and Attention Span: Good  Recall:  Good  Fund of Knowledge:  Good  Language:  Good  Akathisia:  No  Handed:  Right  AIMS (if indicated):     Assets:  Communication Skills Desire for Improvement Housing Resilience Social Support Transportation  ADL's:  Intact  Cognition:  WNL  Sleep:   ok     Assessment and Plan: Major depressive disorder, recurrent.  ADD, inattentive type.  Patient doing well on her current medication.  Continue Ritalin 5 mg every day, Trintellix 20 mg in the morning, Lamictal 200 mg daily and trazodone 50 mg at bedtime.  Discussed medication side effects and benefits.  Recommended to call us back if she is any question or any concern.  Follow-up in 3 months.  Follow Up Instructions:    I discussed the assessment and treatment plan with the patient. The patient was provided an opportunity to ask questions and all were answered. The patient  agreed with the plan and demonstrated an understanding of the instructions.   The patient was advised to call back or seek an in-person evaluation if the symptoms worsen or if the condition fails to improve as anticipated.  I provided 17 minutes of non-face-to-face time during this encounter.   Kathlee Nations, MD

## 2020-05-11 ENCOUNTER — Other Ambulatory Visit (HOSPITAL_COMMUNITY): Payer: Self-pay | Admitting: Psychiatry

## 2020-05-11 DIAGNOSIS — F331 Major depressive disorder, recurrent, moderate: Secondary | ICD-10-CM

## 2020-06-07 ENCOUNTER — Encounter: Payer: Self-pay | Admitting: Internal Medicine

## 2020-06-07 DIAGNOSIS — K219 Gastro-esophageal reflux disease without esophagitis: Secondary | ICD-10-CM

## 2020-06-07 MED ORDER — OMEPRAZOLE 20 MG PO CPDR: 20.0000 mg | DELAYED_RELEASE_CAPSULE | Freq: Every day | ORAL | 3 refills | Status: DC

## 2020-06-10 ENCOUNTER — Encounter: Admission: RE | Payer: Self-pay | Source: Home / Self Care

## 2020-06-10 ENCOUNTER — Ambulatory Visit: Admission: RE | Admit: 2020-06-10 | Payer: Medicare Other | Source: Home / Self Care | Admitting: Internal Medicine

## 2020-06-10 SURGERY — COLONOSCOPY WITH PROPOFOL
Anesthesia: General

## 2020-06-18 ENCOUNTER — Encounter: Payer: Self-pay | Admitting: Internal Medicine

## 2020-06-18 ENCOUNTER — Telehealth (INDEPENDENT_AMBULATORY_CARE_PROVIDER_SITE_OTHER): Payer: Medicare Other | Admitting: Internal Medicine

## 2020-06-18 ENCOUNTER — Other Ambulatory Visit: Payer: Self-pay | Admitting: Internal Medicine

## 2020-06-18 ENCOUNTER — Ambulatory Visit: Payer: Medicare Other | Admitting: Internal Medicine

## 2020-06-18 VITALS — Ht 62.0 in | Wt 155.0 lb

## 2020-06-18 DIAGNOSIS — J0110 Acute frontal sinusitis, unspecified: Secondary | ICD-10-CM | POA: Diagnosis not present

## 2020-06-18 DIAGNOSIS — Z1389 Encounter for screening for other disorder: Secondary | ICD-10-CM

## 2020-06-18 DIAGNOSIS — E785 Hyperlipidemia, unspecified: Secondary | ICD-10-CM | POA: Diagnosis not present

## 2020-06-18 DIAGNOSIS — E039 Hypothyroidism, unspecified: Secondary | ICD-10-CM | POA: Diagnosis not present

## 2020-06-18 DIAGNOSIS — Z1231 Encounter for screening mammogram for malignant neoplasm of breast: Secondary | ICD-10-CM

## 2020-06-18 NOTE — Progress Notes (Signed)
Virtual Visit via Video Note  I connected with [name]  on 06/18/20 at  3:45 PM EST by a video enabled telemedicine application and verified that I am speaking with the correct person using two identifiers.  Location patient: home, [place] Location provider:work or home office Persons participating in the virtual visit: patient, provider  I discussed the limitations of evaluation and management by telemedicine and the availability of in person appointments. The patient expressed understanding and agreed to proceed.   HPI: 1. Sinusitis frontal and nose sxs started today with eye pressure no fever, h/a tried zyrtec/dayquil this am no sick contacts. Has flonase also disc NS  It feels like a head cold she has some sinus sx's like qd not sure environmental triggers   2. H/o colon cancer pending f/u Dr. Tasia Catchings 07/05/20 and labs 07/02/20 with CT chest/ab/pelvis reviewed with pt today colonoscopy Dr. Alice Reichert moved back to 07/2018   -COVID-19 vaccine status: 3/3   ROS: See pertinent positives and negatives per HPI.  Past Medical History:  Diagnosis Date  . Allergy   . Anemia   . Carcinoma ([place])   . Colon cancer ([place]) 01/2017  . Colon polyps   . Diverticulitis    01/2018 abscess hosp. 01/2018  . Endometriosis   . Fibromyalgia   . Gastric polyps   . GERD (gastroesophageal reflux disease)   . Hemorrhoids   . Hypothyroidism   . Iron deficiency anemia 03/08/2017  . Major depressive disorder, recurrent episode, mild (HCC)    Dr. Adele Schilder in [place] h/o ECT x 6 x and hosp x 2 and transcranial magnetic treatment   . Miscarriage    x2  . PONV (postoperative nausea and vomiting)   . Skin cancer    arm, thigh and chest Dr. Evorn Gong   . Sleep apnea    USES CPAP  . UTI (urinary tract infection)     Past Surgical History:  Procedure Laterality Date  . ABDOMINAL HYSTERECTOMY  1990   endometriosis  . BREAST BIOPSY Left 2004   benign  . CATARACT EXTRACTION W/PHACO Right 10/26/2018   Procedure: CATARACT  EXTRACTION PHACO AND INTRAOCULAR LENS PLACEMENT ([place]) RIGHT TORIC LENS;  Surgeon: Leandrew Koyanagi, MD;  Location: [place];  Service: Ophthalmology;  Laterality: Right;  sleep apnea  . CATARACT EXTRACTION, BILATERAL Bilateral 05/2012  . CHOLECYSTECTOMY  2001  . COLONOSCOPY WITH PROPOFOL N/A 12/28/2016   Procedure: COLONOSCOPY WITH PROPOFOL;  Surgeon: Manya Silvas, MD;  Location: [facility] ENDOSCOPY;  Service: Endoscopy;  Laterality: N/A;  . COLONOSCOPY WITH PROPOFOL N/A 01/17/2018   Procedure: COLONOSCOPY WITH PROPOFOL;  Surgeon: Manya Silvas, MD;  Location: [facility] ENDOSCOPY;  Service: Endoscopy;  Laterality: N/A;  . ESOPHAGOGASTRODUODENOSCOPY (EGD) WITH PROPOFOL N/A 12/28/2016   Procedure: ESOPHAGOGASTRODUODENOSCOPY (EGD) WITH PROPOFOL;  Surgeon: Manya Silvas, MD;  Location: [facility] ENDOSCOPY;  Service: Endoscopy;  Laterality: N/A;  . EYE SURGERY     lens left eye, cataract right eye   . LAPAROSCOPIC RIGHT COLECTOMY Right 01/21/2017   Procedure: LAPAROSCOPIC ASSISTED RIGHT COLECTOMY;  Surgeon: Robert Bellow, MD;  Location: [facility] ORS;  Service: General;  Laterality: Right;  . TONSILLECTOMY       Current Outpatient Medications:  .  calcium-vitamin D (OSCAL WITH D) 500-200 MG-UNIT tablet, Take 2 tablets by mouth 2 (two) times daily., Disp: , Rfl:  .  cetirizine (ZYRTEC) 10 MG tablet, Take 10 mg by mouth daily as needed for allergies. , Disp: , Rfl:  .  estradiol (ESTRACE)  0.5 MG tablet, Take 1 tablet (0.5 mg total) by mouth daily., Disp: 90 tablet, Rfl: 1 .  lamoTRIgine (LAMICTAL) 200 MG tablet, Take 1 tablet (200 mg total) by mouth every morning., Disp: 90 tablet, Rfl: 0 .  levothyroxine (SYNTHROID) 25 MCG tablet, Take 1 tablet (25 mcg total) by mouth daily before breakfast. 30 minutes, Disp: 90 tablet, Rfl: 3 .  methylphenidate (RITALIN) 5 MG tablet, Take one tab daily, Disp: 30 tablet, Rfl: 0 .  Multiple Vitamins-Minerals (MULTIVITAMIN WITH MINERALS) tablet, Take 1  tablet by mouth daily., Disp: , Rfl:  .  omeprazole (PRILOSEC) 20 MG capsule, Take 1 capsule (20 mg total) by mouth daily. 30 minutes before lunch wait 3-4 hours after thyroid pill before taking, Disp: 90 capsule, Rfl: 3 .  ondansetron (ZOFRAN-ODT) 4 MG disintegrating tablet, Take 1 tablet (4 mg total) by mouth 2 (two) times daily as needed for nausea or vomiting. AND PRN if this continues see gastroenterology, Disp: 180 tablet, Rfl: 1 .  Probiotic Product (PROBIOTIC PO), Take by mouth., Disp: , Rfl:  .  traZODone (DESYREL) 50 MG tablet, Take 1 tablet (50 mg total) by mouth at bedtime., Disp: 90 tablet, Rfl: 0 .  TRINTELLIX 20 MG TABS tablet, Take 1 tablet (20 mg total) by mouth every morning., Disp: 30 tablet, Rfl: 2  EXAM:  VITALS per patient if applicable:  GENERAL: alert, oriented, appears well and in no acute distress  HEENT: atraumatic, conjunttiva clear, no obvious abnormalities on inspection of external nose and ears  NECK: normal movements of the head and neck  LUNGS: on inspection no signs of respiratory distress, breathing rate appears normal, no obvious gross SOB, gasping or wheezing  CV: no obvious cyanosis  MS: moves all visible extremities without noticeable abnormality  PSYCH/NEURO: pleasant and cooperative, no obvious depression or anxiety, speech and thought processing grossly intact  ASSESSMENT AND PLAN:  Discussed the following assessment and plan:  Acute frontal sinusitis, recurrence not specified - Plan: Novel Coronavirus, NAA ([facility])  Hyperlipidemia, unspecified hyperlipidemia type - Plan: Lipid panel  Hypothyroidism, unspecified type - Plan: TSH On synthyroid 25 mcg qd   HM Flu shot utd Tdap 10/15/15and 08/10/18 utd prevnar pna 231/28/20 03/2013 zostervax 2/2 shingrix  hep C negative covid 2/2 had3rd due 2nd week 02/10/20 had 3/3  mammo12/10/20 negative ordered rec pt call and sch   S/p hysterectomy out of age window pap -pap 10/15/14  negative neg HPV  Colonoscopy 01/17/18 diverticulosis, [facility] Dr. Tiffany Kocher repeat in 2 years h/o colon cancer -per pt needs smallest scope and not sure if wants f/u 01/18/20 consider CT entero in the future 07/17/19 GI appt [facility]  DEXA 12/21/17 osteopenia consider repeat in 3-5 years   Light smoker age 75 y.o  Dermatology Dr. Inez Catalina office Q6 monthsh/o nmsc and mohs 06/2019 and 07/2019 Dr. Natale Lay Derm due yearly in 2022 with Dr. Kellie Moor 5 or 10/2020   Former PCP Dr. Clayborn Bigness requested records today including carotid US from 01/2018 in system w/o result. Pt to call/send letter to get results faxed to [name]   Specialist  Dr.Yu [facility] Dr. Kellie Moor derm  Psych   -we discussed possible serious and likely etiologies, options for evaluation and workup, limitations of telemedicine visit vs in person visit, treatment, treatment risks and precautions.   I discussed the assessment and treatment plan with the patient. The patient was provided an opportunity to ask questions and all were answered. The patient agreed with the plan and demonstrated  an understanding of the instructions.    Time spent 20 min [name] Jackson, MD

## 2020-06-19 ENCOUNTER — Other Ambulatory Visit (INDEPENDENT_AMBULATORY_CARE_PROVIDER_SITE_OTHER): Payer: Medicare Other

## 2020-06-19 ENCOUNTER — Telehealth (HOSPITAL_COMMUNITY): Payer: Self-pay | Admitting: *Deleted

## 2020-06-19 DIAGNOSIS — F900 Attention-deficit hyperactivity disorder, predominantly inattentive type: Secondary | ICD-10-CM

## 2020-06-19 DIAGNOSIS — J0110 Acute frontal sinusitis, unspecified: Secondary | ICD-10-CM

## 2020-06-19 MED ORDER — METHYLPHENIDATE HCL 5 MG PO TABS: ORAL_TABLET | ORAL | 0 refills | Status: DC

## 2020-06-19 NOTE — Telephone Encounter (Signed)
Patients ritalin scripts go to [facility] in [place].

## 2020-06-19 NOTE — Telephone Encounter (Signed)
Done

## 2020-06-19 NOTE — Telephone Encounter (Signed)
Patient called and requested a refill of Ritalin.  Next appt is 08/09/20. Please review.

## 2020-06-20 LAB — SARS-COV-2, NAA 2 DAY TAT

## 2020-06-20 LAB — NOVEL CORONAVIRUS, NAA: SARS-CoV-2, NAA: NOT DETECTED

## 2020-06-20 LAB — SPECIMEN STATUS REPORT

## 2020-07-01 ENCOUNTER — Encounter: Payer: Self-pay | Admitting: Internal Medicine

## 2020-07-02 ENCOUNTER — Ambulatory Visit
Admission: RE | Admit: 2020-07-02 | Discharge: 2020-07-02 | Disposition: A | Discharge status: Home / Self Care | Payer: Medicare Other | Source: Ambulatory Visit | Attending: Oncology | Admitting: Oncology

## 2020-07-02 ENCOUNTER — Other Ambulatory Visit: Payer: Self-pay

## 2020-07-02 ENCOUNTER — Telehealth: Payer: Self-pay | Admitting: Internal Medicine

## 2020-07-02 ENCOUNTER — Encounter: Payer: Self-pay | Admitting: Internal Medicine

## 2020-07-02 ENCOUNTER — Inpatient Hospital Stay: Payer: Medicare Other | Attending: Oncology

## 2020-07-02 DIAGNOSIS — J8410 Pulmonary fibrosis, unspecified: Secondary | ICD-10-CM | POA: Diagnosis not present

## 2020-07-02 DIAGNOSIS — Z85038 Personal history of other malignant neoplasm of large intestine: Secondary | ICD-10-CM | POA: Insufficient documentation

## 2020-07-02 DIAGNOSIS — D509 Iron deficiency anemia, unspecified: Secondary | ICD-10-CM | POA: Insufficient documentation

## 2020-07-02 DIAGNOSIS — D72820 Lymphocytosis (symptomatic): Secondary | ICD-10-CM | POA: Insufficient documentation

## 2020-07-02 DIAGNOSIS — K7689 Other specified diseases of liver: Secondary | ICD-10-CM | POA: Diagnosis not present

## 2020-07-02 DIAGNOSIS — E039 Hypothyroidism, unspecified: Secondary | ICD-10-CM | POA: Insufficient documentation

## 2020-07-02 DIAGNOSIS — I700 Atherosclerosis of aorta: Secondary | ICD-10-CM | POA: Diagnosis not present

## 2020-07-02 DIAGNOSIS — K3189 Other diseases of stomach and duodenum: Secondary | ICD-10-CM | POA: Diagnosis not present

## 2020-07-02 DIAGNOSIS — K317 Polyp of stomach and duodenum: Secondary | ICD-10-CM | POA: Diagnosis not present

## 2020-07-02 DIAGNOSIS — C189 Malignant neoplasm of colon, unspecified: Secondary | ICD-10-CM | POA: Diagnosis not present

## 2020-07-02 DIAGNOSIS — M5136 Other intervertebral disc degeneration, lumbar region: Secondary | ICD-10-CM | POA: Diagnosis not present

## 2020-07-02 DIAGNOSIS — Z79899 Other long term (current) drug therapy: Secondary | ICD-10-CM | POA: Diagnosis not present

## 2020-07-02 LAB — COMPREHENSIVE METABOLIC PANEL
ALT: 14 U/L (ref 0–44)
AST: 15 U/L (ref 15–41)
Albumin: 4.1 g/dL (ref 3.5–5.0)
Alkaline Phosphatase: 66 U/L (ref 38–126)
Anion gap: 12 (ref 5–15)
BUN: 12 mg/dL (ref 8–23)
CO2: 27 mmol/L (ref 22–32)
Calcium: 9.1 mg/dL (ref 8.9–10.3)
Chloride: 101 mmol/L (ref 98–111)
Creatinine, Ser: 0.95 mg/dL (ref 0.44–1.00)
GFR, Estimated: >60 mL/min (ref >60)
Glucose, Bld: 79 mg/dL (ref 70–99)
Potassium: 4 mmol/L (ref 3.5–5.1)
Sodium: 140 mmol/L (ref 135–145)
Total Bilirubin: 0.5 mg/dL (ref 0.3–1.2)
Total Protein: 6.9 g/dL (ref 6.5–8.1)

## 2020-07-02 LAB — CBC WITH DIFFERENTIAL/PLATELET
Abs Immature Granulocytes: 0.03 10*3/uL (ref 0.00–0.07)
Basophils Absolute: 0.1 10*3/uL (ref 0.0–0.1)
Basophils Relative: 1 %
Eosinophils Absolute: 0.1 10*3/uL (ref 0.0–0.5)
Eosinophils Relative: 1 %
HCT: 41.3 % (ref 36.0–46.0)
Hemoglobin: 13.6 g/dL (ref 12.0–15.0)
Immature Granulocytes: 0 %
Lymphocytes Relative: 35 %
Lymphs Abs: 4.5 10*3/uL — ABNORMAL HIGH (ref 0.7–4.0)
MCH: 30.8 pg (ref 26.0–34.0)
MCHC: 32.9 g/dL (ref 30.0–36.0)
MCV: 93.7 fL (ref 80.0–100.0)
Monocytes Absolute: 0.5 10*3/uL (ref 0.1–1.0)
Monocytes Relative: 4 %
Neutro Abs: 7.7 10*3/uL (ref 1.7–7.7)
Neutrophils Relative %: 59 %
Platelets: 240 10*3/uL (ref 150–400)
RBC: 4.41 MIL/uL (ref 3.87–5.11)
RDW: 12.7 % (ref 11.5–15.5)
WBC: 12.9 10*3/uL — ABNORMAL HIGH (ref 4.0–10.5)
nRBC: 0 % (ref 0.0–0.2)

## 2020-07-02 MED ORDER — IOHEXOL 300 MG/ML  SOLN
85.0000 mL | Freq: Once | INTRAMUSCULAR | Status: AC | PRN
  Administered 2020-07-02: 85 mL via INTRAVENOUS

## 2020-07-02 NOTE — Telephone Encounter (Signed)
Patient called and wanted to make a lab appt. She says the labs from 06/18/2020 have been completed. Does patient need any other labs drawn?

## 2020-07-02 NOTE — Telephone Encounter (Signed)
Please advise 

## 2020-07-03 LAB — CEA: CEA: 1.9 ng/mL (ref 0.0–4.7)

## 2020-07-05 ENCOUNTER — Inpatient Hospital Stay (HOSPITAL_BASED_OUTPATIENT_CLINIC_OR_DEPARTMENT_OTHER): Payer: Medicare Other | Admitting: Oncology

## 2020-07-05 ENCOUNTER — Encounter: Payer: Self-pay | Admitting: Oncology

## 2020-07-05 ENCOUNTER — Other Ambulatory Visit: Payer: Self-pay

## 2020-07-05 ENCOUNTER — Other Ambulatory Visit: Payer: Self-pay | Admitting: Internal Medicine

## 2020-07-05 VITALS — BP 129/73 | HR 66 | Temp 97.6°F | Resp 18 | Wt 157.9 lb

## 2020-07-05 DIAGNOSIS — D509 Iron deficiency anemia, unspecified: Secondary | ICD-10-CM | POA: Diagnosis not present

## 2020-07-05 DIAGNOSIS — Z85038 Personal history of other malignant neoplasm of large intestine: Secondary | ICD-10-CM | POA: Diagnosis not present

## 2020-07-05 DIAGNOSIS — K317 Polyp of stomach and duodenum: Secondary | ICD-10-CM | POA: Diagnosis not present

## 2020-07-05 DIAGNOSIS — E039 Hypothyroidism, unspecified: Secondary | ICD-10-CM

## 2020-07-05 DIAGNOSIS — D72820 Lymphocytosis (symptomatic): Secondary | ICD-10-CM | POA: Diagnosis not present

## 2020-07-05 DIAGNOSIS — Z1389 Encounter for screening for other disorder: Secondary | ICD-10-CM

## 2020-07-05 DIAGNOSIS — E785 Hyperlipidemia, unspecified: Secondary | ICD-10-CM

## 2020-07-05 DIAGNOSIS — D72829 Elevated white blood cell count, unspecified: Secondary | ICD-10-CM

## 2020-07-05 DIAGNOSIS — Z79899 Other long term (current) drug therapy: Secondary | ICD-10-CM | POA: Diagnosis not present

## 2020-07-05 NOTE — Progress Notes (Signed)
Pt here for follow up. No new concerns voiced.   

## 2020-07-05 NOTE — Progress Notes (Signed)
Hematology/Oncology Follow Up Note [facility] Telephone:([phone] [phone] Patient Care Team: McLean-Scocuzza, Nino Glow, MD as PCP - General (Internal Medicine) Manya Silvas, MD (Inactive) (Gastroenterology) Bary Castilla, Forest Gleason, MD (General Surgery) Clent Jacks, RN as Registered Nurse  REASON FOR VISIT Follow up for treatment of management of colon cancer and iron deficiency anemia.  HISTORY OF PRESENTING ILLNESS:  [name] is a @ 75 y.o.  female with PMH listed below who was referred to me for evaluation and management of colon cancer.  a colonoscopy done on 12/25/2016 by Dr. Vira Agar. Moves her bowels daily. Patient states she is nausea, states it started at the end of June .She states she was having pain in her left upper quadrant pain was seen in the ER on 12/24/2016 Upper and lower endoscopies dated 12/28/2016 reviewed which showed Cecal mass, biopsy-proven adenocarcinoma. Patient underwent laparoscopically assisted right hemicolectomy with ileotransverse colostomy. Pathology revealed pT3N0 adenocarcinoma, with perineural invasion and lymphvascular invasion  #  Cancer Stage: pT3N0cM0, stage colon cancer. Lymphovascular Invasion: Present  Perineural Invasion: Present   Declined adjuvant chemotherapy. MMR- intact.  #Repeat colonoscopy 01/17/2018 by Dr. Vira Agar showed diverticulosis in the sigmoid colon, in the descending colon and transverse colon.  Internal hemorrhoids.  No specimen collected.  Interval History Patient presents for follow-up of colon cancer and iron deficiency anemia.  Patient reports feeling well. No new complaints. Patient has had interval CT abdomen pelvis with contrast done. Denies any black or bloody stool. Chronic nausea unchanged.  Review of Systems  Constitutional: Negative for chills, fever, malaise/fatigue and weight loss.  HENT: Negative for sore throat.   Eyes: Negative for redness.   Respiratory: Negative for cough, shortness of breath and wheezing.   Cardiovascular: Negative for chest pain, palpitations and leg swelling.  Gastrointestinal: Positive for nausea. Negative for abdominal pain, blood in stool and vomiting.  Genitourinary: Negative for dysuria.  Musculoskeletal: Negative for myalgias.  Skin: Negative for rash.  Neurological: Negative for dizziness, tingling and tremors.  Endo/Heme/Allergies: Does not bruise/bleed easily.  Psychiatric/Behavioral: Negative for hallucinations.     MEDICAL HISTORY:  Past Medical History:  Diagnosis Date  . Allergy   . Anemia   . Carcinoma ([place])   . Colon cancer ([place]) 01/2017  . Colon polyps   . Diverticulitis    01/2018 abscess hosp. 01/2018  . Endometriosis   . Fibromyalgia   . Gastric polyps   . GERD (gastroesophageal reflux disease)   . Hemorrhoids   . Hypothyroidism   . Iron deficiency anemia 03/08/2017  . Major depressive disorder, recurrent episode, mild (HCC)    Dr. Adele Schilder in [place] h/o ECT x 6 x and hosp x 2 and transcranial magnetic treatment   . Miscarriage    x2  . PONV (postoperative nausea and vomiting)   . Skin cancer    arm, thigh and chest Dr. Evorn Gong   . Sleep apnea    USES CPAP  . UTI (urinary tract infection)     SURGICAL HISTORY: Past Surgical History:  Procedure Laterality Date  . ABDOMINAL HYSTERECTOMY  1990   endometriosis  . BREAST BIOPSY Left 2004   benign  . CATARACT EXTRACTION W/PHACO Right 10/26/2018   Procedure: CATARACT EXTRACTION PHACO AND INTRAOCULAR LENS PLACEMENT ([place]) RIGHT TORIC LENS;  Surgeon: Leandrew Koyanagi, MD;  Location: [place];  Service: Ophthalmology;  Laterality: Right;  sleep apnea  . CATARACT EXTRACTION, BILATERAL Bilateral 05/2012  . CHOLECYSTECTOMY  2001  . COLONOSCOPY WITH PROPOFOL  N/A 12/28/2016   Procedure: COLONOSCOPY WITH PROPOFOL;  Surgeon: Manya Silvas, MD;  Location: [facility] ENDOSCOPY;  Service: Endoscopy;  Laterality: N/A;  .  COLONOSCOPY WITH PROPOFOL N/A 01/17/2018   Procedure: COLONOSCOPY WITH PROPOFOL;  Surgeon: Manya Silvas, MD;  Location[facility] ENDOSCOPY;  Service: Endoscopy;  Laterality: N/A;  . ESOPHAGOGASTRODUODENOSCOPY (EGD) WITH PROPOFOL N/A 12/28/2016   Procedure: ESOPHAGOGASTRODUODENOSCOPY (EGD) WITH PROPOFOL;  Surgeon: Manya Silvas, MD;  Location[facility] ENDOSCOPY;  Service: Endoscopy;  Laterality: N/A;  . EYE SURGERY     lens left eye, cataract right eye   . LAPAROSCOPIC RIGHT COLECTOMY Right 01/21/2017   Procedure: LAPAROSCOPIC ASSISTED RIGHT COLECTOMY;  Surgeon: Robert Bellow, MD;  Location: [facility] ORS;  Service: General;  Laterality: Right;  . TONSILLECTOMY      SOCIAL HISTORY: Social History   Socioeconomic History  . Marital status: Married    Spouse name: Not on file  . Number of children: Not on file  . Years of education: Not on file  . Highest education level: Not on file  Occupational History  . Not on file  Tobacco Use  . Smoking status: Never Smoker  . Smokeless tobacco: Never Used  Vaping Use  . Vaping Use: Never used  Substance and Sexual Activity  . Alcohol use: Not Currently    Alcohol/week: 0.0 standard drinks    Comment:    . Drug use: No  . Sexual activity: Yes    Partners: Male    Birth control/protection: None  Other Topics Concern  . 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 Difficulty of Paying Living Expenses: Not hard at all  Food Insecurity: No Food Insecurity  . Worried About Charity fundraiser in the Last Year: Never true  . Ran Out of Food in the Last Year: Never true  Transportation Needs: No Transportation Needs  . Lack of Transportation (Medical): No  . Lack of Transportation (Non-Medical): No  Physical Activity: Not on file   Stress: Not on file  Social Connections: Not on file  Intimate Partner Violence: Not on file    FAMILY HISTORY: Family History  Problem Relation Age of Onset  . Depression Brother   . Alcohol abuse Brother   . Suicidality Brother   . Stroke Mother   . Hyperlipidemia Mother   . Hypertension Mother   . Clotting disorder Brother   . Cancer Brother        MDS  . Colon polyps Father   . Melanoma Sister   . Cancer Sister        skin MM  . Stroke Maternal Grandfather   . Melanoma Nephew   . Bipolar disorder Neg Hx     ALLERGIES:  is allergic to other and codeine.  MEDICATIONS:  Current Outpatient Medications  Medication Sig Dispense Refill  . calcium-vitamin D (OSCAL WITH D) 500-200 MG-UNIT tablet Take 2 tablets by mouth 2 (two) times daily.    . cetirizine (ZYRTEC) 10 MG tablet Take 10 mg by mouth daily as needed for allergies.     Marland Kitchen estradiol (ESTRACE) 0.5 MG tablet Take 1 tablet (0.5 mg total) by mouth daily. 90 tablet 1  . lamoTRIgine (LAMICTAL)  200 MG tablet Take 1 tablet (200 mg total) by mouth every morning. 90 tablet 0  . levothyroxine (SYNTHROID) 25 MCG tablet Take 1 tablet (25 mcg total) by mouth daily before breakfast. 30 minutes 90 tablet 3  . methylphenidate (RITALIN) 5 MG tablet Take one tab daily 30 tablet 0  . Multiple Vitamins-Minerals (MULTIVITAMIN WITH MINERALS) tablet Take 1 tablet by mouth daily.    Marland Kitchen omeprazole (PRILOSEC) 20 MG capsule Take 1 capsule (20 mg total) by mouth daily. 30 minutes before lunch wait 3-4 hours after thyroid pill before taking 90 capsule 3  . ondansetron (ZOFRAN-ODT) 4 MG disintegrating tablet Take 1 tablet (4 mg total) by mouth 2 (two) times daily as needed for nausea or vomiting. AND PRN if this continues see gastroenterology 180 tablet 1  . Probiotic Product (PROBIOTIC PO) Take by mouth.    . traZODone (DESYREL) 50 MG tablet Take 1 tablet (50 mg total) by mouth at bedtime. 90 tablet 0  . TRINTELLIX 20 MG TABS tablet Take 1 tablet  (20 mg total) by mouth every morning. 30 tablet 2   No current facility-administered medications for this visit.      Marland Kitchen  PHYSICAL EXAMINATION: ECOG PERFORMANCE STATUS: 0 - Asymptomatic Vitals:   07/05/20 1313  BP: 129/73  Pulse: 66  Resp: 18  Temp: 97.6 F (36.4 C)   Filed Weights   07/05/20 1313  Weight: 157 lb 14.4 oz (71.6 kg)   Physical Exam Constitutional:      General: She is not in acute distress.    Appearance: She is not diaphoretic.  HENT:     Head: Normocephalic and atraumatic.     Mouth/Throat:     Pharynx: No oropharyngeal exudate.  Eyes:     General: No scleral icterus.    Pupils: Pupils are equal, round, and reactive to light.  Cardiovascular:     Rate and Rhythm: Normal rate and regular rhythm.     Heart sounds: No murmur heard.   Pulmonary:     Effort: Pulmonary effort is normal. No respiratory distress.     Breath sounds: No rales.  Chest:     Chest wall: No tenderness.  Abdominal:     General: There is no distension.     Palpations: Abdomen is soft.     Tenderness: There is no abdominal tenderness.  Musculoskeletal:        General: Normal range of motion.     Cervical back: Normal range of motion and neck supple.  Skin:    General: Skin is warm and dry.     Findings: No erythema.  Neurological:     Mental Status: She is alert and oriented to person, place, and time.  Psychiatric:        Mood and Affect: Affect normal.       LABORATORY DATA:  I have reviewed the data as listed Lab Results  Component Value Date   WBC 12.9 (H) 07/02/2020   HGB 13.6 07/02/2020   HCT 41.3 07/02/2020   MCV 93.7 07/02/2020   PLT 240 07/02/2020   Recent Labs    01/05/20 1243 01/09/20 0829 07/02/20 0846  NA 139 140 140  K 3.8 3.8 4.0  CL 103 102 101  CO2 '27 29 27  ' GLUCOSE 114* 84 79  BUN '10 10 12  ' CREATININE 0.90 0.98 0.95  CALCIUM 9.0 9.8 9.1  GFRNONAA >60  --  >60  GFRAA >60  --   --   PROT 6.7  6.9 6.9  ALBUMIN 3.9 4.3 4.1  AST '19 15  15  ' ALT '18 15 14  ' ALKPHOS 63 62 66  BILITOT 0.5 0.6 0.5    RADIOGRAPHIC STUDIES: I have personally reviewed the radiological images as listed and agreed with the findings in the report. CT CHEST ABDOMEN PELVIS W CONTRAST  Result Date: 07/02/2020 CLINICAL DATA:  Colon cancer diagnosed 8/18 with partial colon resection in September. Chronic nausea. Hysterectomy. Cholecystectomy. EXAM: CT CHEST, ABDOMEN, AND PELVIS WITH CONTRAST TECHNIQUE: Multidetector CT imaging of the chest, abdomen and pelvis was performed following the standard protocol during bolus administration of intravenous contrast. CONTRAST:  22m OMNIPAQUE IOHEXOL 300 MG/ML  SOLN COMPARISON:  01/03/2020 abdominopelvic CT. Most recent chest CT 06/28/2019. FINDINGS: CT CHEST FINDINGS Cardiovascular: Aortic atherosclerosis. Normal heart size, without pericardial effusion. No central pulmonary embolism, on this non-dedicated study. Mediastinum/Nodes: No supraclavicular adenopathy. No mediastinal or hilar adenopathy. Lungs/Pleura: No pleural fluid. No suspicious pulmonary nodule or mass. Calcified granuloma in the posterior left upper lobe. Musculoskeletal: No acute osseous abnormality. CT ABDOMEN PELVIS FINDINGS Hepatobiliary: Segment 3 hepatic cyst is similar at 8 mm. Cholecystectomy, without biliary ductal dilatation. Pancreas: Normal, without mass or ductal dilatation. Spleen: Normal in size, without focal abnormality. Adrenals/Urinary Tract: Normal adrenal glands. Normal kidneys, without hydronephrosis. Normal urinary bladder. Stomach/Bowel: Proximal gastric underdistention. Gastric polyps are suboptimally evaluated but have been previously biopsied. Example 1.7 cm in the gastric body on 53/2. Extensive colonic diverticulosis. Right hemicolectomy. Normal small bowel. Vascular/Lymphatic: Aortic atherosclerosis. No abdominopelvic adenopathy. Reproductive: Hysterectomy.  No adnexal mass. Other: No significant free fluid. No evidence of omental or  peritoneal disease. No free intraperitoneal air. Musculoskeletal: Degenerative disc disease at L3-4. No worrisome osseous lesion. IMPRESSION: 1. Status post right hemicolectomy, without recurrent or metastatic disease. 2.  Aortic Atherosclerosis (ICD10-I70.0). Electronically Signed   By: KAbigail MiyamotoM.D.   On: 07/02/2020 16:57    ASSESSMENT & PLAN:  1. History of colon cancer   2. Gastric polyp   3. Lymphocytosis   Cancer Staging Cancer of right colon ([facility] Staging form: Colon and Rectum, AJCC 8th Edition - Clinical: cT1 - Unsigned - Pathologic stage from 03/08/2017: Stage IIA (pT3, pN0, cM0) - Signed by YEarlie Server MD on 03/08/2017  #History of stage IIA right side colon cancer (03/2017), with high risk feasures: Lymphovascular Invasion, Perineural Invasion:  Patient declined adjuvant chemotherapy. Labs reviewed and discussed with patient. Stable counts CEA is pending. Interval CT chest abdomen pelvis images were independently reviewed by me and discussed with patient. No recurrence or metastatic disease. Recommend to increase interval of surveillance CT to annually. She agrees with the plan.  History of multiple gastric polyps, biopsied on 12/28/2016. Benign pathology. Recommend patient to continue follow-up with gastroenterology. .Marland Kitchen#Nausea.  Chronic problem for her.Etiology unclear.   #Leukocytosis Follow-up in 1 year with repeat CT scanning and the labs. CBC CMP CEA. All questions were answered. The patient knows to call the clinic with any problems questions or concerns. We spent sufficient time to discuss many aspect of care, questions were answered to patient's satisfaction.  Return of visit:  6 months.  Orders Placed This Encounter  Procedures  . CT CHEST ABDOMEN PELVIS W CONTRAST    Standing Status:   Future    Standing Expiration Date:   07/05/2021    Order Specific Question:   If indicated for the ordered procedure, I authorize the administration of contrast media per  Radiology protocol    Answer:   Yes  Order Specific Question:   Preferred imaging location?    Answer:   [facility]    Order Specific Question:   Is Oral Contrast requested for this exam?    Answer:   Yes, Per Radiology protocol  . CBC with Differential/Platelet    Standing Status:   Future    Standing Expiration Date:   07/05/2021  . CBC with Differential/Platelet    Standing Status:   Future    Standing Expiration Date:   07/05/2021  . Comprehensive metabolic panel    Standing Status:   Future    Standing Expiration Date:   07/05/2021  . CEA    Standing Status:   Future    Standing Expiration Date:   07/05/2021     Earlie Server, MD, PhD Hematology Oncology [facility] 07/05/2020

## 2020-07-05 NOTE — Telephone Encounter (Signed)
Please sch fasting labs cholesterol, thyroid, urine was not done and white blood cell ct was elevated lets repeat this as well please   Thank you for checking labs in

## 2020-07-08 NOTE — Telephone Encounter (Signed)
Patient has been scheduled for 3/17 for labs

## 2020-07-15 ENCOUNTER — Other Ambulatory Visit: Payer: Self-pay

## 2020-07-15 ENCOUNTER — Ambulatory Visit
Admission: RE | Admit: 2020-07-15 | Discharge: 2020-07-15 | Disposition: A | Discharge status: Home / Self Care | Payer: Medicare Other | Source: Ambulatory Visit | Attending: Internal Medicine | Admitting: Internal Medicine

## 2020-07-15 DIAGNOSIS — Z1231 Encounter for screening mammogram for malignant neoplasm of breast: Secondary | ICD-10-CM | POA: Diagnosis not present

## 2020-07-16 DIAGNOSIS — D225 Melanocytic nevi of trunk: Secondary | ICD-10-CM | POA: Diagnosis not present

## 2020-07-16 DIAGNOSIS — Z85828 Personal history of other malignant neoplasm of skin: Secondary | ICD-10-CM | POA: Diagnosis not present

## 2020-07-16 DIAGNOSIS — L821 Other seborrheic keratosis: Secondary | ICD-10-CM | POA: Diagnosis not present

## 2020-07-16 DIAGNOSIS — L728 Other follicular cysts of the skin and subcutaneous tissue: Secondary | ICD-10-CM | POA: Diagnosis not present

## 2020-07-16 DIAGNOSIS — D485 Neoplasm of uncertain behavior of skin: Secondary | ICD-10-CM | POA: Diagnosis not present

## 2020-07-16 DIAGNOSIS — D2261 Melanocytic nevi of right upper limb, including shoulder: Secondary | ICD-10-CM | POA: Diagnosis not present

## 2020-07-16 DIAGNOSIS — D2272 Melanocytic nevi of left lower limb, including hip: Secondary | ICD-10-CM | POA: Diagnosis not present

## 2020-07-16 DIAGNOSIS — D2262 Melanocytic nevi of left upper limb, including shoulder: Secondary | ICD-10-CM | POA: Diagnosis not present

## 2020-07-18 ENCOUNTER — Other Ambulatory Visit: Payer: Medicare Other

## 2020-07-22 ENCOUNTER — Telehealth (HOSPITAL_COMMUNITY): Payer: Self-pay | Admitting: *Deleted

## 2020-07-22 DIAGNOSIS — F900 Attention-deficit hyperactivity disorder, predominantly inattentive type: Secondary | ICD-10-CM

## 2020-07-22 NOTE — Telephone Encounter (Signed)
Pt called requesting refill of the Ritalin. Pt has an appointment on 08/09/20. Please send to [facility] in [place].

## 2020-07-23 MED ORDER — METHYLPHENIDATE HCL 5 MG PO TABS
ORAL_TABLET | ORAL | 0 refills | Status: DC
Start: 2020-07-23 — End: 2020-08-09

## 2020-07-23 NOTE — Telephone Encounter (Signed)
Done

## 2020-07-28 ENCOUNTER — Encounter: Payer: Self-pay | Admitting: Internal Medicine

## 2020-07-29 NOTE — Telephone Encounter (Signed)
Patient scheduled for virtual 07/30/20 at 3:30

## 2020-07-30 ENCOUNTER — Telehealth (INDEPENDENT_AMBULATORY_CARE_PROVIDER_SITE_OTHER): Payer: Medicare Other | Admitting: Internal Medicine

## 2020-07-30 ENCOUNTER — Encounter: Payer: Self-pay | Admitting: Internal Medicine

## 2020-07-30 DIAGNOSIS — R519 Headache, unspecified: Secondary | ICD-10-CM

## 2020-07-30 DIAGNOSIS — R0982 Postnasal drip: Secondary | ICD-10-CM | POA: Diagnosis not present

## 2020-07-30 DIAGNOSIS — D72829 Elevated white blood cell count, unspecified: Secondary | ICD-10-CM | POA: Diagnosis not present

## 2020-07-30 DIAGNOSIS — R059 Cough, unspecified: Secondary | ICD-10-CM | POA: Diagnosis not present

## 2020-07-30 DIAGNOSIS — R112 Nausea with vomiting, unspecified: Secondary | ICD-10-CM

## 2020-07-30 DIAGNOSIS — J9801 Acute bronchospasm: Secondary | ICD-10-CM

## 2020-07-30 DIAGNOSIS — E039 Hypothyroidism, unspecified: Secondary | ICD-10-CM

## 2020-07-30 DIAGNOSIS — J329 Chronic sinusitis, unspecified: Secondary | ICD-10-CM | POA: Diagnosis not present

## 2020-07-30 MED ORDER — AZITHROMYCIN 250 MG PO TABS: ORAL_TABLET | ORAL | 0 refills | Status: DC

## 2020-07-30 MED ORDER — ONDANSETRON 4 MG PO TBDP: 4.0000 mg | ORAL_TABLET | Freq: Two times a day (BID) | ORAL | 1 refills | Status: DC | PRN

## 2020-07-30 MED ORDER — IPRATROPIUM BROMIDE 0.03 % NA SOLN
2.0000 | Freq: Three times a day (TID) | NASAL | 2 refills | Status: DC
Start: 2020-07-30 — End: 2022-05-20

## 2020-07-30 MED ORDER — LEVOTHYROXINE SODIUM 25 MCG PO TABS: 25.0000 ug | ORAL_TABLET | Freq: Every day | ORAL | 3 refills | Status: DC

## 2020-07-30 MED ORDER — SALINE SPRAY 0.65 % NA SOLN
2.0000 | Freq: Every day | NASAL | 2 refills | Status: DC | PRN
Start: 2020-07-30 — End: 2022-05-20

## 2020-07-30 NOTE — Progress Notes (Signed)
Virtual Visit via Video Note  I connected with [name]  on 07/30/20 at  3:30 PM EDT by a video enabled telemedicine application and verified that I am speaking with the correct person using two identifiers.  Location patient: home, [place] Location provider:work or home office Persons participating in the virtual visit: patient, provider, pts husband Mr. Briel  I discussed the limitations of evaluation and management by telemedicine and the availability of in person appointments. The patient expressed understanding and agreed to proceed.   HPI: X 1 month had intermittent sx's sore throat, chest tightness, nausea, h/a intermittent clear sputum/chest congestion w/o wheezing, PND denies heartburn has tried zyrtec for sx's. Currently denies cough, wheezing sore throat currently is "dry sore throat". No h/o allergies or heartburn. This am she threw up 3x and took zofran and daywuil for cough   No known covid exposure but not wearing a mask in public husband had similar sx's as well  CT chest had 07/02/20 no lung etiology and has pending colonoscopy with covid test 07/21/20  07/02/20 labs WBC 12.9 with elevated lymphocytes  -COVID-19 vaccine status: 3/3  ROS: See pertinent positives and negatives per HPI.  Past Medical History:  Diagnosis Date  . Allergy   . Anemia   . Carcinoma ([place])   . Colon cancer ([place]) 01/2017  . Colon polyps   . Diverticulitis    01/2018 abscess hosp. 01/2018  . Endometriosis   . Fibromyalgia   . Gastric polyps   . GERD (gastroesophageal reflux disease)   . Hemorrhoids   . Hypothyroidism   . Iron deficiency anemia 03/08/2017  . Major depressive disorder, recurrent episode, mild (HCC)    Dr. Adele Schilder in [place] h/o ECT x 6 x and hosp x 2 and transcranial magnetic treatment   . Miscarriage    x2  . PONV (postoperative nausea and vomiting)   . Skin cancer    arm, thigh and chest Dr. Evorn Gong   . Sleep apnea    USES CPAP  . UTI (urinary tract infection)     Past  Surgical History:  Procedure Laterality Date  . ABDOMINAL HYSTERECTOMY  1990   endometriosis  . BREAST BIOPSY Left 2004   benign  . CATARACT EXTRACTION W/PHACO Right 10/26/2018   Procedure: CATARACT EXTRACTION PHACO AND INTRAOCULAR LENS PLACEMENT ([place]) RIGHT TORIC LENS;  Surgeon: Leandrew Koyanagi, MD;  Location: [place];  Service: Ophthalmology;  Laterality: Right;  sleep apnea  . CATARACT EXTRACTION, BILATERAL Bilateral 05/2012  . CHOLECYSTECTOMY  2001  . COLONOSCOPY WITH PROPOFOL N/A 12/28/2016   Procedure: COLONOSCOPY WITH PROPOFOL;  Surgeon: Manya Silvas, MD;  Location: [facility] ENDOSCOPY;  Service: Endoscopy;  Laterality: N/A;  . COLONOSCOPY WITH PROPOFOL N/A 01/17/2018   Procedure: COLONOSCOPY WITH PROPOFOL;  Surgeon: Manya Silvas, MD;  Location: [facility] ENDOSCOPY;  Service: Endoscopy;  Laterality: N/A;  . ESOPHAGOGASTRODUODENOSCOPY (EGD) WITH PROPOFOL N/A 12/28/2016   Procedure: ESOPHAGOGASTRODUODENOSCOPY (EGD) WITH PROPOFOL;  Surgeon: Manya Silvas, MD;  Location: [facility] ENDOSCOPY;  Service: Endoscopy;  Laterality: N/A;  . EYE SURGERY     lens left eye, cataract right eye   . LAPAROSCOPIC RIGHT COLECTOMY Right 01/21/2017   Procedure: LAPAROSCOPIC ASSISTED RIGHT COLECTOMY;  Surgeon: Robert Bellow, MD;  Location: [facility] ORS;  Service: General;  Laterality: Right;  . TONSILLECTOMY       Current Outpatient Medications:  .  azithromycin (ZITHROMAX) 250 MG tablet, 2 pills day 1 and 1 pill day 2-5 with food, Disp: 6 tablet,  Rfl: 0 .  calcium-vitamin D (OSCAL WITH D) 500-200 MG-UNIT tablet, Take 2 tablets by mouth 2 (two) times daily., Disp: , Rfl:  .  cetirizine (ZYRTEC) 10 MG tablet, Take 10 mg by mouth daily as needed for allergies. , Disp: , Rfl:  .  estradiol (ESTRACE) 0.5 MG tablet, Take 1 tablet (0.5 mg total) by mouth daily., Disp: 90 tablet, Rfl: 1 .  ipratropium (ATROVENT) 0.03 % nasal spray, Place 2 sprays into both nostrils 3 (three) times daily. prn, Disp:  30 mL, Rfl: 2 .  lamoTRIgine (LAMICTAL) 200 MG tablet, Take 1 tablet (200 mg total) by mouth every morning., Disp: 90 tablet, Rfl: 0 .  levothyroxine (SYNTHROID) 25 MCG tablet, Take 1 tablet (25 mcg total) by mouth daily before breakfast. 30 minutes, Disp: 90 tablet, Rfl: 3 .  methylphenidate (RITALIN) 5 MG tablet, Take one tab daily, Disp: 30 tablet, Rfl: 0 .  Multiple Vitamins-Minerals (MULTIVITAMIN WITH MINERALS) tablet, Take 1 tablet by mouth daily., Disp: , Rfl:  .  omeprazole (PRILOSEC) 20 MG capsule, Take 1 capsule (20 mg total) by mouth daily. 30 minutes before lunch wait 3-4 hours after thyroid pill before taking, Disp: 90 capsule, Rfl: 3 .  Probiotic Product (PROBIOTIC PO), Take by mouth., Disp: , Rfl:  .  sodium chloride (OCEAN) 0.65 % SOLN nasal spray, Place 2 sprays into both nostrils daily as needed for congestion., Disp: 30 mL, Rfl: 2 .  traZODone (DESYREL) 50 MG tablet, Take 1 tablet (50 mg total) by mouth at bedtime., Disp: 90 tablet, Rfl: 0 .  TRINTELLIX 20 MG TABS tablet, Take 1 tablet (20 mg total) by mouth every morning., Disp: 30 tablet, Rfl: 2 .  ondansetron (ZOFRAN-ODT) 4 MG disintegrating tablet, Take 1 tablet (4 mg total) by mouth 2 (two) times daily as needed for nausea or vomiting. AND PRN if this continues see gastroenterology, Disp: 180 tablet, Rfl: 1  EXAM:  VITALS per patient if applicable:  GENERAL: alert, oriented, appears well and in no acute distress  HEENT: atraumatic, conjunttiva clear, no obvious abnormalities on inspection of external nose and ears  NECK: normal movements of the head and neck  LUNGS: on inspection no signs of respiratory distress, breathing rate appears normal, no obvious gross SOB, gasping or wheezing  CV: no obvious cyanosis  MS: moves all visible extremities without noticeable abnormality  PSYCH/NEURO: pleasant and cooperative, no obvious depression or anxiety, speech and thought processing grossly intact  ASSESSMENT AND  PLAN:  Discussed the following assessment and plan: Acute nonintractable headache, unspecified headache type Cough Could be viral vs bacterial Sinusitis, unspecified chronicity, unspecified location - Plan: sodium chloride (OCEAN) 0.65 % SOLN nasal spray, azithromycin (ZITHROMAX) 250 MG tablet, ipratropium (ATROVENT) 0.03 % nasal spray Pt to take home covid test today and formal covid test is 07/31/20 prior to colonoscopy  Bronchospasm with chest tightness declines albuterol for now   Nausea and vomiting in adult - Plan: ondansetron (ZOFRAN-ODT) 4 MG disintegrating tablet  PND (post-nasal drip) - Plan: sodium chloride (OCEAN) 0.65 % SOLN nasal spray, ipratropium (ATROVENT) 0.03 % nasal spray Prn zyrtec   Leukocytosis, unspecified type Will need to recheck in future for resolution     -we discussed possible serious and likely etiologies, options for evaluation and workup, limitations of telemedicine visit vs in person visit, treatment, treatment risks and precautions.  I discussed the assessment and treatment plan with the patient. The patient was provided an opportunity to ask questions and all were answered. The  patient agreed with the plan and demonstrated an understanding of the instructions.    Time spent 20 min  [name] Jackson, MD

## 2020-07-31 ENCOUNTER — Encounter: Payer: Self-pay | Admitting: Internal Medicine

## 2020-07-31 ENCOUNTER — Other Ambulatory Visit (HOSPITAL_COMMUNITY): Payer: Self-pay | Admitting: Psychiatry

## 2020-07-31 DIAGNOSIS — F331 Major depressive disorder, recurrent, moderate: Secondary | ICD-10-CM

## 2020-07-31 DIAGNOSIS — Z01818 Encounter for other preprocedural examination: Secondary | ICD-10-CM | POA: Diagnosis not present

## 2020-08-01 ENCOUNTER — Encounter: Payer: Self-pay | Admitting: Internal Medicine

## 2020-08-01 NOTE — Telephone Encounter (Signed)
Added to mychart encounter for Husband.

## 2020-08-02 ENCOUNTER — Telehealth: Payer: Self-pay | Admitting: Internal Medicine

## 2020-08-02 NOTE — Telephone Encounter (Signed)
For your information  

## 2020-08-02 NOTE — Telephone Encounter (Signed)
Tiffany with [employer] called and states that medication was denied due to a non FDA compendia use. Provider can appeal within 72 hours or standard within business days. The appeal can be faxed [phone]

## 2020-08-02 NOTE — Telephone Encounter (Signed)
Inform pt odt zofran denied does she want to try tablets not dissolvable, dissolvable is not covered by insurance

## 2020-08-02 NOTE — Telephone Encounter (Signed)
Prior authorization has been submitted for patient's Zofran  Awaiting approval or denial.

## 2020-08-04 ENCOUNTER — Other Ambulatory Visit (HOSPITAL_COMMUNITY): Payer: Self-pay | Admitting: Psychiatry

## 2020-08-04 DIAGNOSIS — F331 Major depressive disorder, recurrent, moderate: Secondary | ICD-10-CM

## 2020-08-05 DIAGNOSIS — Z980 Intestinal bypass and anastomosis status: Secondary | ICD-10-CM | POA: Diagnosis not present

## 2020-08-05 DIAGNOSIS — K2281 Esophageal polyp: Secondary | ICD-10-CM | POA: Diagnosis not present

## 2020-08-05 DIAGNOSIS — K5730 Diverticulosis of large intestine without perforation or abscess without bleeding: Secondary | ICD-10-CM | POA: Diagnosis not present

## 2020-08-05 DIAGNOSIS — R110 Nausea: Secondary | ICD-10-CM | POA: Diagnosis not present

## 2020-08-05 DIAGNOSIS — K640 First degree hemorrhoids: Secondary | ICD-10-CM | POA: Diagnosis not present

## 2020-08-05 DIAGNOSIS — Z85038 Personal history of other malignant neoplasm of large intestine: Secondary | ICD-10-CM | POA: Diagnosis not present

## 2020-08-05 DIAGNOSIS — K641 Second degree hemorrhoids: Secondary | ICD-10-CM | POA: Diagnosis not present

## 2020-08-05 DIAGNOSIS — K317 Polyp of stomach and duodenum: Secondary | ICD-10-CM | POA: Diagnosis not present

## 2020-08-05 DIAGNOSIS — K449 Diaphragmatic hernia without obstruction or gangrene: Secondary | ICD-10-CM | POA: Diagnosis not present

## 2020-08-05 DIAGNOSIS — K2270 Barrett's esophagus without dysplasia: Secondary | ICD-10-CM | POA: Diagnosis not present

## 2020-08-05 NOTE — Telephone Encounter (Signed)
Left message for patient to return calls to office.

## 2020-08-06 DIAGNOSIS — R195 Other fecal abnormalities: Secondary | ICD-10-CM | POA: Diagnosis not present

## 2020-08-06 DIAGNOSIS — R110 Nausea: Secondary | ICD-10-CM | POA: Diagnosis not present

## 2020-08-06 DIAGNOSIS — Z85038 Personal history of other malignant neoplasm of large intestine: Secondary | ICD-10-CM | POA: Diagnosis not present

## 2020-08-06 DIAGNOSIS — D131 Benign neoplasm of stomach: Secondary | ICD-10-CM | POA: Diagnosis not present

## 2020-08-06 DIAGNOSIS — K219 Gastro-esophageal reflux disease without esophagitis: Secondary | ICD-10-CM | POA: Diagnosis not present

## 2020-08-08 NOTE — Telephone Encounter (Signed)
I think this was supposed to come to you

## 2020-08-08 NOTE — Telephone Encounter (Signed)
See message from patien tshe says she has 5 months worth of Zofran ODT she is good.

## 2020-08-08 NOTE — Telephone Encounter (Signed)
Pt called she said that she still has 5 months worth of the mediation

## 2020-08-09 ENCOUNTER — Other Ambulatory Visit: Payer: Self-pay

## 2020-08-09 ENCOUNTER — Telehealth (INDEPENDENT_AMBULATORY_CARE_PROVIDER_SITE_OTHER): Payer: Medicare Other | Admitting: Psychiatry

## 2020-08-09 ENCOUNTER — Encounter (HOSPITAL_COMMUNITY): Payer: Self-pay | Admitting: Psychiatry

## 2020-08-09 DIAGNOSIS — F900 Attention-deficit hyperactivity disorder, predominantly inattentive type: Secondary | ICD-10-CM

## 2020-08-09 DIAGNOSIS — F331 Major depressive disorder, recurrent, moderate: Secondary | ICD-10-CM | POA: Diagnosis not present

## 2020-08-09 MED ORDER — TRINTELLIX 20 MG PO TABS: 20.0000 mg | ORAL_TABLET | ORAL | 2 refills | Status: DC

## 2020-08-09 MED ORDER — LAMOTRIGINE 200 MG PO TABS
200.0000 mg | ORAL_TABLET | ORAL | 0 refills | Status: DC
Start: 2020-08-09 — End: 2020-11-25

## 2020-08-09 MED ORDER — METHYLPHENIDATE HCL 5 MG PO TABS: ORAL_TABLET | ORAL | 0 refills | Status: DC

## 2020-08-09 MED ORDER — TRAZODONE HCL 50 MG PO TABS
50.0000 mg | ORAL_TABLET | Freq: Every day | ORAL | 0 refills | Status: DC
Start: 2020-08-09 — End: 2020-11-25

## 2020-08-09 NOTE — Progress Notes (Signed)
Virtual Visit via Telephone Note  I connected with [name] on 08/09/20 at  8:40 AM EDT by telephone and verified that I am speaking with the correct person using two identifiers.  Location: Patient: Home Provider: Home Office   I discussed the limitations, risks, security and privacy concerns of performing an evaluation and management service by telephone and the availability of in person appointments. I also discussed with the patient that there may be a patient responsible charge related to this service. The patient expressed understanding and agreed to proceed.   History of Present Illness: Patient is evaluated on the phone.  She has been doing well on her current medication.  She had a flulike symptoms a few weeks ago and her WBC counts were high but after the antibiotic she is feeling better.  She reported her COVID was negative.  She reported her depression anxiety is under control.  Her attention concentration is good.  She is able to do multitasking.  She does take some time now during the day but she is sleeping at night pretty well.  She is pleased that her husband has total knee replacement in [place] but it took a little longer than usual time to recover but now he is feeling better.  Now has been diagnosed with tongue cancer and doing more work-up.  Patient has no tremors shakes or any EPS.  She denies any hallucination, paranoia or any suicidal thoughts.  She denies anhedonia, hopelessness.  Energy level is good.  Her appetite is okay and her weight is stable.  She has no rash or any itching.  She like to keep the current medication.   Past Psychiatric History: H/Omania, irritability, impulsive behavior and overdose at age 75.H/O inpatient twice. Hadpsychological testingand diagnosed ADD.Took Concerta, (Adderall, Vyvansemadeanxious)Wellbutrin, Prozac, lithium, Effexor, Lexapro, Abilify and Cymbalta. Best whengiven female hormone to help endometriosis. Had ECT,  did not work. TMS helped.No history of psychosis.  Recent Results (from the past 2160 hour(s))  Novel Coronavirus, NAA ([facility])     Status: None   Collection Time: 06/19/20 12:00 AM   Specimen: Nasopharyngeal(NP) swabs in vial transport medium   Nasopharynge  Result Value Ref Range   SARS-CoV-2, NAA Not Detected Not Detected    Comment: This nucleic acid amplification test was developed and its performance characteristics determined by [employer]. Nucleic acid amplification tests include RT-PCR and TMA. This test has not been FDA cleared or approved. This test has been authorized by FDA under an Emergency Use Authorization (EUA). This test is only authorized for the duration of time the declaration that circumstances exist justifying the authorization of the emergency use of in vitro diagnostic tests for detection of SARS-CoV-2 virus and/or diagnosis of COVID-19 infection under section 564(b)(1) of the Act, 21 U.S.C. 656CLE-7(N) (1), unless the authorization is terminated or revoked sooner. When diagnostic testing is negative, the possibility of a false negative result should be considered in the context of a patient's recent exposures and the presence of clinical signs and symptoms consistent with COVID-19. An individual without symptoms of COVID-19 and who is not shedding SARS-CoV-2 virus wo uld expect to have a negative (not detected) result in this assay.   SARS-COV-2, NAA 2 DAY TAT     Status: None   Collection Time: 06/19/20 12:00 AM   Nasopharynge  Result Value Ref Range   SARS-CoV-2, NAA 2 DAY TAT Performed   Specimen status report     Status: None   Collection Time: 06/19/20 12:00  AM  Result Value Ref Range   specimen status report Comment     Comment: Please note Please note The date and/or time of collection was not indicated on the requisition as required by state and federal law.  The date of receipt of the specimen was used as the collection date if  not supplied.   CEA     Status: None   Collection Time: 07/02/20  8:46 AM  Result Value Ref Range   CEA 1.9 0.0 - 4.7 ng/mL    Comment: (NOTE)                             Nonsmokers          <3.9                             Smokers             <5.6 Roche [employer]ostics Electrochemiluminescence Immunoassay (ECLIA) Values obtained with different assay methods or kits cannot be used interchangeably.  Results cannot be interpreted as absolute evidence of the presence or absence of malignant disease. Performed At: [facility] [place], [place] [id number] Rush Farmer MD RJ:[id number]   Comprehensive metabolic panel     Status: None   Collection Time: 07/02/20  8:46 AM  Result Value Ref Range   Sodium 140 135 - 145 mmol/L   Potassium 4.0 3.5 - 5.1 mmol/L   Chloride 101 98 - 111 mmol/L   CO2 27 22 - 32 mmol/L   Glucose, Bld 79 70 - 99 mg/dL    Comment: Glucose reference range applies only to samples taken after fasting for at least 8 hours.   BUN 12 8 - 23 mg/dL   Creatinine, Ser 0.95 0.44 - 1.00 mg/dL   Calcium 9.1 8.9 - 10.3 mg/dL   Total Protein 6.9 6.5 - 8.1 g/dL   Albumin 4.1 3.5 - 5.0 g/dL   AST 15 15 - 41 U/L   ALT 14 0 - 44 U/L   Alkaline Phosphatase 66 38 - 126 U/L   Total Bilirubin 0.5 0.3 - 1.2 mg/dL   GFR, Estimated >60 >60 mL/min    Comment: (NOTE) Calculated using the CKD-EPI Creatinine Equation (2021)    Anion gap 12 5 - 15    Comment: Performed at [facility], [address] [place], [place] [address]  CBC with Differential/Platelet     Status: Abnormal   Collection Time: 07/02/20  8:46 AM  Result Value Ref Range   WBC 12.9 (H) 4.0 - 10.5 K/uL   RBC 4.41 3.87 - 5.11 MIL/uL   Hemoglobin 13.6 12.0 - 15.0 g/dL   HCT 41.3 36.0 - 46.0 %   MCV 93.7 80.0 - 100.0 fL   MCH 30.8 26.0 - 34.0 pg   MCHC 32.9 30.0 - 36.0 g/dL   RDW 12.7 11.5 - 15.5 %   Platelets 240 150 - 400 K/uL   nRBC 0.0 0.0 - 0.2 %   Neutrophils Relative % 59 %    Neutro Abs 7.7 1.7 - 7.7 K/uL   Lymphocytes Relative 35 %   Lymphs Abs 4.5 (H) 0.7 - 4.0 K/uL   Monocytes Relative 4 %   Monocytes Absolute 0.5 0.1 - 1.0 K/uL   Eosinophils Relative 1 %   Eosinophils Absolute 0.1 0.0 - 0.5 K/uL   Basophils Relative 1 %  Basophils Absolute 0.1 0.0 - 0.1 K/uL   Immature Granulocytes 0 %   Abs Immature Granulocytes 0.03 0.00 - 0.07 K/uL    Comment: Performed at [facility], [address] [place], [place] [address]    Psychiatric Specialty Exam: Physical Exam  Review of Systems  Weight 157 lb (71.2 kg).Body mass index is 28.72 kg/m.  General Appearance: NA  Eye Contact:  NA  Speech:  Clear and Coherent  Volume:  Normal  Mood:  Euthymic  Affect:  NA  Thought Process:  Goal Directed  Orientation:  Full (Time, Place, and Person)  Thought Content:  Logical  Suicidal Thoughts:  No  Homicidal Thoughts:  No  Memory:  Immediate;   Good Recent;   Good Remote;   Good  Judgement:  Good  Insight:  Good  Psychomotor Activity:  NA  Concentration:  Concentration: Good and Attention Span: Good  Recall:  Good  Fund of Knowledge:  Good  Language:  Good  Akathisia:  No  Handed:  Right  AIMS (if indicated):     Assets:  Communication Skills Desire for Improvement Housing Resilience Social Support Transportation  ADL's:  Intact  Cognition:  WNL  Sleep:   ok     Assessment and Plan: Major depressive disorder, recurrent.  ADD, inattentive type.  Patient is a stable on her current medication.  She reported no concerns or side effects from the medication.  Continue Ritalin 5 mg in the morning, Trintellix 20 mg in the morning, Lamictal 200 mg daily and trazodone 50 mg at bedtime.  Recommended to call us back if is any question or any concern.  Discussed medication side effects and benefits.  Patient does not ask him the difference for her stimulants.  I also reviewed blood work results.  Follow-up in 3 months.  Follow Up Instructions:     I discussed the assessment and treatment plan with the patient. The patient was provided an opportunity to ask questions and all were answered. The patient agreed with the plan and demonstrated an understanding of the instructions.   The patient was advised to call back or seek an in-person evaluation if the symptoms worsen or if the condition fails to improve as anticipated.  I provided 15 minutes of non-face-to-face time during this encounter.   Kathlee Nations, MD

## 2020-08-13 DIAGNOSIS — H04123 Dry eye syndrome of bilateral lacrimal glands: Secondary | ICD-10-CM | POA: Diagnosis not present

## 2020-08-29 ENCOUNTER — Encounter: Payer: Self-pay | Admitting: Internal Medicine

## 2020-08-29 DIAGNOSIS — K2270 Barrett's esophagus without dysplasia: Secondary | ICD-10-CM | POA: Insufficient documentation

## 2020-09-14 ENCOUNTER — Other Ambulatory Visit: Payer: Self-pay | Admitting: Internal Medicine

## 2020-09-14 DIAGNOSIS — N959 Unspecified menopausal and perimenopausal disorder: Secondary | ICD-10-CM

## 2020-09-29 ENCOUNTER — Encounter: Payer: Self-pay | Admitting: Internal Medicine

## 2020-10-01 ENCOUNTER — Telehealth: Payer: Self-pay | Admitting: Internal Medicine

## 2020-10-01 ENCOUNTER — Telehealth (HOSPITAL_COMMUNITY): Payer: Self-pay | Admitting: *Deleted

## 2020-10-01 DIAGNOSIS — F900 Attention-deficit hyperactivity disorder, predominantly inattentive type: Secondary | ICD-10-CM

## 2020-10-01 MED ORDER — METHYLPHENIDATE HCL 5 MG PO TABS
ORAL_TABLET | ORAL | 0 refills | Status: DC
Start: 2020-10-01 — End: 2020-10-01

## 2020-10-01 MED ORDER — METHYLPHENIDATE HCL 5 MG PO TABS: ORAL_TABLET | ORAL | 0 refills | Status: DC

## 2020-10-01 NOTE — Addendum Note (Signed)
Addended by: Thressa Sheller on: 10/01/2020 08:08 AM   Modules accepted: Orders

## 2020-10-01 NOTE — Telephone Encounter (Signed)
Could you print lab orders for pt to go to [facility]

## 2020-10-01 NOTE — Telephone Encounter (Signed)
Printed

## 2020-10-01 NOTE — Telephone Encounter (Signed)
PT came into the office to pick up the lab paperwork but she wanted to check to see if she can still have labs done at our office location or does she have to go elsewhere. She would like some clarification as she was under the assumption we were not doing labs here anymore and she has to go elsewhere,

## 2020-10-01 NOTE — Telephone Encounter (Signed)
Done

## 2020-10-01 NOTE — Telephone Encounter (Signed)
Pt called requesting refill of the Ritalin 5 mg. Pt has an upcoming appointment on 11/25/20. Please review.

## 2020-10-01 NOTE — Telephone Encounter (Signed)
Called and informed the Patient that we do still do labs in office. However, at her last appointment it was discussed that her labs were requested to be done at [facility].  Patient aware and understanding that since these orders were made for [facility] she will need to take these and have them done there.

## 2020-10-04 ENCOUNTER — Inpatient Hospital Stay: Payer: Medicare Other | Attending: Oncology

## 2020-10-04 DIAGNOSIS — Z85038 Personal history of other malignant neoplasm of large intestine: Secondary | ICD-10-CM | POA: Diagnosis not present

## 2020-10-04 LAB — CBC WITH DIFFERENTIAL/PLATELET
Abs Immature Granulocytes: 0.01 10*3/uL (ref 0.00–0.07)
Basophils Absolute: 0.1 10*3/uL (ref 0.0–0.1)
Basophils Relative: 1 %
Eosinophils Absolute: 0.1 10*3/uL (ref 0.0–0.5)
Eosinophils Relative: 1 %
HCT: 37.3 % (ref 36.0–46.0)
Hemoglobin: 12.6 g/dL (ref 12.0–15.0)
Immature Granulocytes: 0 %
Lymphocytes Relative: 41 %
Lymphs Abs: 4.2 10*3/uL — ABNORMAL HIGH (ref 0.7–4.0)
MCH: 31.2 pg (ref 26.0–34.0)
MCHC: 33.8 g/dL (ref 30.0–36.0)
MCV: 92.3 fL (ref 80.0–100.0)
Monocytes Absolute: 0.6 10*3/uL (ref 0.1–1.0)
Monocytes Relative: 6 %
Neutro Abs: 5.3 10*3/uL (ref 1.7–7.7)
Neutrophils Relative %: 51 %
Platelets: 218 10*3/uL (ref 150–400)
RBC: 4.04 MIL/uL (ref 3.87–5.11)
RDW: 12.6 % (ref 11.5–15.5)
WBC: 10.2 10*3/uL (ref 4.0–10.5)
nRBC: 0 % (ref 0.0–0.2)

## 2020-11-03 DIAGNOSIS — Z20822 Contact with and (suspected) exposure to covid-19: Secondary | ICD-10-CM | POA: Diagnosis not present

## 2020-11-04 ENCOUNTER — Encounter: Payer: Self-pay | Admitting: Internal Medicine

## 2020-11-05 ENCOUNTER — Telehealth (HOSPITAL_COMMUNITY): Payer: Self-pay | Admitting: *Deleted

## 2020-11-05 DIAGNOSIS — F900 Attention-deficit hyperactivity disorder, predominantly inattentive type: Secondary | ICD-10-CM

## 2020-11-05 MED ORDER — METHYLPHENIDATE HCL 5 MG PO TABS
ORAL_TABLET | ORAL | 0 refills | Status: DC
Start: 2020-11-05 — End: 2020-11-25

## 2020-11-05 NOTE — Telephone Encounter (Signed)
Done

## 2020-11-05 NOTE — Telephone Encounter (Signed)
Pt called requesting refill of Ritalin 5 mg. Pt has an upcoming appointment on 11/25/20. Please review.

## 2020-11-06 ENCOUNTER — Other Ambulatory Visit (HOSPITAL_COMMUNITY): Payer: Self-pay | Admitting: Psychiatry

## 2020-11-06 DIAGNOSIS — Z20822 Contact with and (suspected) exposure to covid-19: Secondary | ICD-10-CM | POA: Diagnosis not present

## 2020-11-06 DIAGNOSIS — F331 Major depressive disorder, recurrent, moderate: Secondary | ICD-10-CM

## 2020-11-07 ENCOUNTER — Other Ambulatory Visit (HOSPITAL_COMMUNITY): Payer: Self-pay | Admitting: *Deleted

## 2020-11-07 ENCOUNTER — Other Ambulatory Visit (HOSPITAL_COMMUNITY): Payer: Self-pay | Admitting: Psychiatry

## 2020-11-07 DIAGNOSIS — F331 Major depressive disorder, recurrent, moderate: Secondary | ICD-10-CM

## 2020-11-07 MED ORDER — TRINTELLIX 20 MG PO TABS: 20.0000 mg | ORAL_TABLET | ORAL | 0 refills | Status: DC

## 2020-11-25 ENCOUNTER — Other Ambulatory Visit: Payer: Self-pay

## 2020-11-25 ENCOUNTER — Telehealth (INDEPENDENT_AMBULATORY_CARE_PROVIDER_SITE_OTHER): Payer: Medicare Other | Admitting: Psychiatry

## 2020-11-25 ENCOUNTER — Encounter (HOSPITAL_COMMUNITY): Payer: Self-pay | Admitting: Psychiatry

## 2020-11-25 DIAGNOSIS — F331 Major depressive disorder, recurrent, moderate: Secondary | ICD-10-CM | POA: Diagnosis not present

## 2020-11-25 DIAGNOSIS — F900 Attention-deficit hyperactivity disorder, predominantly inattentive type: Secondary | ICD-10-CM | POA: Diagnosis not present

## 2020-11-25 MED ORDER — LAMOTRIGINE 200 MG PO TABS: 200.0000 mg | ORAL_TABLET | ORAL | 0 refills | Status: DC

## 2020-11-25 MED ORDER — METHYLPHENIDATE HCL 5 MG PO TABS: ORAL_TABLET | ORAL | 0 refills | Status: DC

## 2020-11-25 MED ORDER — TRINTELLIX 20 MG PO TABS: 20.0000 mg | ORAL_TABLET | ORAL | 2 refills | Status: DC

## 2020-11-25 MED ORDER — TRAZODONE HCL 50 MG PO TABS: 50.0000 mg | ORAL_TABLET | Freq: Every evening | ORAL | 0 refills | Status: DC | PRN

## 2020-11-25 NOTE — Progress Notes (Signed)
Virtual Visit via Telephone Note  I connected with [name] on 11/25/20 at  8:40 AM EDT by telephone and verified that I am speaking with the correct person using two identifiers.  Location: Patient: Home Provider: Home Office   I discussed the limitations, risks, security and privacy concerns of performing an evaluation and management service by telephone and the availability of in person appointments. I also discussed with the patient that there may be a patient responsible charge related to this service. The patient expressed understanding and agreed to proceed.   History of Present Illness: Patient is evaluated on the phone.  She had a good vacation to [place] with her children and grandkids.  Patient told she had COVID when she returned from vacation but doing much better.  She feels the current medicine is working and she denies any crying spells, feeling of hopelessness or worthlessness.  Occasionally she gets tired during the afternoon but her sleep is good.  She denies any crying spells or any feeling of hopelessness or worthlessness.  Her husband who was diagnosed with tongue cancer is getting treatment and they have decided not to do radiation because of more complications.  Patient denies any paranoia, hallucination.  Her appetite is okay.  Her energy level is good.  Her weight is stable.  She is taking Ritalin that helps her focus, attention and multitasking.  While she was in the hospital taking care of her husband and she has not taken trazodone and her sleep was okay.  Past Psychiatric History:  H/O mania, irritability, impulsive behavior and overdose at age 4. H/O inpatient twice. Had psychological testing and diagnosed ADD. Took Concerta, (Adderall, Vyvanse made anxious) Wellbutrin, Prozac, lithium, Effexor, Lexapro, Abilify and Cymbalta.  Best when given female hormone to help endometriosis.  Had ECT, did not work. TMS helped. No history of psychosis.   Recent  Results (from the past 2160 hour(s))  CBC with Differential/Platelet     Status: Abnormal   Collection Time: 10/04/20  1:15 PM  Result Value Ref Range   WBC 10.2 4.0 - 10.5 K/uL   RBC 4.04 3.87 - 5.11 MIL/uL   Hemoglobin 12.6 12.0 - 15.0 g/dL   HCT 37.3 36.0 - 46.0 %   MCV 92.3 80.0 - 100.0 fL   MCH 31.2 26.0 - 34.0 pg   MCHC 33.8 30.0 - 36.0 g/dL   RDW 12.6 11.5 - 15.5 %   Platelets 218 150 - 400 K/uL   nRBC 0.0 0.0 - 0.2 %   Neutrophils Relative % 51 %   Neutro Abs 5.3 1.7 - 7.7 K/uL   Lymphocytes Relative 41 %   Lymphs Abs 4.2 (H) 0.7 - 4.0 K/uL   Monocytes Relative 6 %   Monocytes Absolute 0.6 0.1 - 1.0 K/uL   Eosinophils Relative 1 %   Eosinophils Absolute 0.1 0.0 - 0.5 K/uL   Basophils Relative 1 %   Basophils Absolute 0.1 0.0 - 0.1 K/uL   Immature Granulocytes 0 %   Abs Immature Granulocytes 0.01 0.00 - 0.07 K/uL    Comment: Performed at [facility], [address], [place], [place] [address]       Psychiatric Specialty Exam: Physical Exam  Review of Systems  Weight 155 lb (70.3 kg).There is no height or weight on file to calculate BMI.  General Appearance: NA  Eye Contact:  NA  Speech:  Clear and Coherent  Volume:  Normal  Mood:  Euthymic  Affect:  NA  Thought Process:  Goal Directed  Orientation:  Full (Time, Place, and Person)  Thought Content:  WDL  Suicidal Thoughts:  No  Homicidal Thoughts:  No  Memory:  Immediate;   Good Recent;   Good Remote;   Good  Judgement:  Intact  Insight:  Present  Psychomotor Activity:  NA  Concentration:  Concentration: Good and Attention Span: Good  Recall:  Good  Fund of Knowledge:  Good  Language:  Good  Akathisia:  No  Handed:  Right  AIMS (if indicated):     Assets:  Communication Skills Desire for Improvement Housing Resilience Transportation  ADL's:  Intact  Cognition:  WNL  Sleep:   ok      Assessment and Plan: Major depressive disorder, recurrent.  ADD, inattentive type.  Patient is  stable on her current medication.  She is recovering from [place] and feeling better.  I discussed that she can try taking trazodone only as needed since there are few times that she has not taken and sleep okay.  She is willing to try but is still like to have a prescription of trazodone 50 mg at bedtime.  She also like to continue Lamictal, Trintellix and Ritalin.  She has no rash, itching tremors or shakes.  Continue Ritalin 5 mg in the morning, Trintellix 20 mg in the morning and Lamictal 200 mg daily.  Recommended to call us back if she has any question or any concern.  Follow-up in 3 months.  Follow Up Instructions:    I discussed the assessment and treatment plan with the patient. The patient was provided an opportunity to ask questions and all were answered. The patient agreed with the plan and demonstrated an understanding of the instructions.   The patient was advised to call back or seek an in-person evaluation if the symptoms worsen or if the condition fails to improve as anticipated.  I provided 15 minutes of non-face-to-face time during this encounter.   Kathlee Nations, MD

## 2020-12-04 DIAGNOSIS — E785 Hyperlipidemia, unspecified: Secondary | ICD-10-CM | POA: Diagnosis not present

## 2020-12-04 DIAGNOSIS — E039 Hypothyroidism, unspecified: Secondary | ICD-10-CM | POA: Diagnosis not present

## 2020-12-04 DIAGNOSIS — Z1389 Encounter for screening for other disorder: Secondary | ICD-10-CM | POA: Diagnosis not present

## 2020-12-04 DIAGNOSIS — D72829 Elevated white blood cell count, unspecified: Secondary | ICD-10-CM | POA: Diagnosis not present

## 2020-12-11 ENCOUNTER — Encounter: Payer: Self-pay | Admitting: Internal Medicine

## 2020-12-11 ENCOUNTER — Telehealth (INDEPENDENT_AMBULATORY_CARE_PROVIDER_SITE_OTHER): Payer: Medicare Other | Admitting: Internal Medicine

## 2020-12-11 ENCOUNTER — Other Ambulatory Visit: Payer: Self-pay

## 2020-12-11 VITALS — Ht 62.0 in | Wt 152.0 lb

## 2020-12-11 DIAGNOSIS — N760 Acute vaginitis: Secondary | ICD-10-CM | POA: Diagnosis not present

## 2020-12-11 DIAGNOSIS — E039 Hypothyroidism, unspecified: Secondary | ICD-10-CM

## 2020-12-11 DIAGNOSIS — B373 Candidiasis of vulva and vagina: Secondary | ICD-10-CM | POA: Diagnosis not present

## 2020-12-11 DIAGNOSIS — E785 Hyperlipidemia, unspecified: Secondary | ICD-10-CM | POA: Diagnosis not present

## 2020-12-11 DIAGNOSIS — B3731 Acute candidiasis of vulva and vagina: Secondary | ICD-10-CM

## 2020-12-11 MED ORDER — FLUCONAZOLE 150 MG PO TABS: 150.0000 mg | ORAL_TABLET | Freq: Once | ORAL | 0 refills | Status: AC

## 2020-12-11 NOTE — Progress Notes (Signed)
Telephone Note  I connected with [name]  on 12/11/20 at 12:00 PM EDT by telephone and verified that I am speaking with the correct person using two identifiers.  Location patient: home,  Location provider:work or home office Persons participating in the virtual visit: patient, provider  I discussed the limitations of evaluation and management by telemedicine and the availability of in person appointments. The patient expressed understanding and agreed to proceed.   HPI:  Acute telemedicine visit for : Vaginal irritation after changing from dove soap to safeguard/dial and husband had yeast infection and tx'ed and he is better but she has vaginal irritation tried monistat 1 and monistat x 2 w/o relief  Hypothyroidism levo 25 mcg qd tsh normal labs 2.250 12/04/20 [facility] Hld pt will try healthy diet and exercise tc 207 sl improved from 218 and ldl 124 from 129 01/2020  -COVID-19 vaccine status: 4/4 covid 19   ROS: See pertinent positives and negatives per HPI.  Past Medical History:  Diagnosis Date   Allergy    Anemia    Carcinoma ([place])    Colon cancer ([place]) 01/2017   Colon polyps    Diverticulitis    01/2018 abscess hosp. 01/2018   Endometriosis    Fibromyalgia    Gastric polyps    GERD (gastroesophageal reflux disease)    Hemorrhoids    Hypothyroidism    Iron deficiency anemia 03/08/2017   Major depressive disorder, recurrent episode, mild (HCC)    Dr. Adele Schilder in [place] h/o ECT x 6 x and hosp x 2 and transcranial magnetic treatment    Miscarriage    x2   PONV (postoperative nausea and vomiting)    Skin cancer    arm, thigh and chest Dr. Evorn Gong    Sleep apnea    USES CPAP   UTI (urinary tract infection)     Past Surgical History:  Procedure Laterality Date   ABDOMINAL HYSTERECTOMY  1990   endometriosis   BREAST BIOPSY Left 2004   benign   CATARACT EXTRACTION W/PHACO Right 10/26/2018   Procedure: CATARACT EXTRACTION PHACO AND INTRAOCULAR LENS PLACEMENT ([place]) RIGHT  TORIC LENS;  Surgeon: Leandrew Koyanagi, MD;  Location: [facility];  Service: Ophthalmology;  Laterality: Right;  sleep apnea   CATARACT EXTRACTION, BILATERAL Bilateral 05/2012   CHOLECYSTECTOMY  2001   COLONOSCOPY WITH PROPOFOL N/A 12/28/2016   Procedure: COLONOSCOPY WITH PROPOFOL;  Surgeon: Manya Silvas, MD;  Location: [facility] ENDOSCOPY;  Service: Endoscopy;  Laterality: N/A;   COLONOSCOPY WITH PROPOFOL N/A 01/17/2018   Procedure: COLONOSCOPY WITH PROPOFOL;  Surgeon: Manya Silvas, MD;  Location: [facility] ENDOSCOPY;  Service: Endoscopy;  Laterality: N/A;   ESOPHAGOGASTRODUODENOSCOPY (EGD) WITH PROPOFOL N/A 12/28/2016   Procedure: ESOPHAGOGASTRODUODENOSCOPY (EGD) WITH PROPOFOL;  Surgeon: Manya Silvas, MD;  Location: [facility] [place] ENDOSCOPY;  Service: Endoscopy;  Laterality: N/A;   EYE SURGERY     lens left eye, cataract right eye    LAPAROSCOPIC RIGHT COLECTOMY Right 01/21/2017   Procedure: LAPAROSCOPIC ASSISTED RIGHT COLECTOMY;  Surgeon: Robert Bellow, MD;  Location: [facility] ORS;  Service: General;  Laterality: Right;   TONSILLECTOMY       Current Outpatient Medications:    calcium-vitamin D (OSCAL WITH D) 500-200 MG-UNIT tablet, Take 2 tablets by mouth 2 (two) times daily., Disp: , Rfl:    cetirizine (ZYRTEC) 10 MG tablet, Take 10 mg by mouth daily as needed for allergies. , Disp: , Rfl:    estradiol (ESTRACE) 0.5 MG tablet, TAKE ONE TABLET BY  MOUTH DAILY, Disp: 90 tablet, Rfl: 1   fluconazole (DIFLUCAN) 150 MG tablet, Take 1 tablet (150 mg total) by mouth once for 1 dose. Repeat in 3 days prn, Disp: 2 tablet, Rfl: 0   ipratropium (ATROVENT) 0.03 % nasal spray, Place 2 sprays into both nostrils 3 (three) times daily. prn, Disp: 30 mL, Rfl: 2   lamoTRIgine (LAMICTAL) 200 MG tablet, Take 1 tablet (200 mg total) by mouth every morning., Disp: 90 tablet, Rfl: 0   levothyroxine (SYNTHROID) 25 MCG tablet, Take 1 tablet (25 mcg total) by mouth daily before breakfast. 30 minutes, Disp: 90  tablet, Rfl: 3   methylphenidate (RITALIN) 5 MG tablet, Take one tab daily, Disp: 30 tablet, Rfl: 0   Multiple Vitamins-Minerals (MULTIVITAMIN WITH MINERALS) tablet, Take 1 tablet by mouth daily., Disp: , Rfl:    omeprazole (PRILOSEC) 20 MG capsule, Take 1 capsule (20 mg total) by mouth daily. 30 minutes before lunch wait 3-4 hours after thyroid pill before taking, Disp: 90 capsule, Rfl: 3   ondansetron (ZOFRAN-ODT) 4 MG disintegrating tablet, Take 1 tablet (4 mg total) by mouth 2 (two) times daily as needed for nausea or vomiting. AND PRN if this continues see gastroenterology, Disp: 180 tablet, Rfl: 1   Probiotic Product (PROBIOTIC PO), Take by mouth., Disp: , Rfl:    traZODone (DESYREL) 50 MG tablet, Take 1 tablet (50 mg total) by mouth at bedtime as needed for sleep., Disp: 90 tablet, Rfl: 0   TRINTELLIX 20 MG TABS tablet, Take 1 tablet (20 mg total) by mouth every morning., Disp: 30 tablet, Rfl: 2   azithromycin (ZITHROMAX) 250 MG tablet, 2 pills day 1 and 1 pill day 2-5 with food (Patient not taking: Reported on 12/11/2020), Disp: 6 tablet, Rfl: 0   sodium chloride (OCEAN) 0.65 % SOLN nasal spray, Place 2 sprays into both nostrils daily as needed for congestion. (Patient not taking: Reported on 12/11/2020), Disp: 30 mL, Rfl: 2  EXAM:  VITALS per patient if applicable:  GENERAL: alert, oriented, appears well and in no acute distress  PSYCH/NEURO: pleasant and cooperative, no obvious depression or anxiety, speech and thought processing grossly intact  ASSESSMENT AND PLAN:  Discussed the following assessment and plan:  Yeast vaginitis - Plan: fluconazole (DIFLUCAN) 150 MG tablet Acute vaginitis - Plan: Urine cytology ancillary only([facility]) Use milder soap Tried monistat 1 and cream 2x w/o complete relief   Hld Rec healthy diet and exercise   Hypothyroidism  On levo 25 mcg qd   HM  Flu shot utd Tdap 02/15/14 and 08/10/18 utd prevnar pna 23 05/31/18  03/2013 zostervax  2/2  shingrix    hep C negative  covid 4/4 utd   mammo 07/15/20 negative    S/p hysterectomy out of age window pap  -pap 10/15/14 negative neg HPV    Colonoscopy 01/17/18 diverticulosis, [facility] Dr. Tiffany Kocher repeat in 2 years h/o colon cancer -per pt needs smallest scope and not sure if wants f/u 01/18/20 consider CT entero in the future  07/17/19 GI appt [facility]   DEXA 12/21/17 osteopenia consider repeat in 3-5 years    Light smoker age 20 y.o    Dermatology Dr. Inez Catalina office Q6 months h/o nmsc and mohs 06/2019 and 07/2019 Dr. Natale Lay  Derm due yearly in 2022 with Dr. Kellie Moor 5 or 10/2020    Former PCP Dr. Clayborn Bigness requested records today including carotid US from 01/2018 in system w/o result. Pt to call/send letter to get results faxed  to [name]     Specialist Dr.[facility] Dr. Kellie Moor derm Psych  -we discussed possible serious and likely etiologies, options for evaluation and workup, limitations of telemedicine visit vs in person visit, treatment, treatment risks and precautions. Pt prefers to treat via telemedicine empirically rather than in person at this moment.      I discussed the assessment and treatment plan with the patient. The patient was provided an opportunity to ask questions and all were answered. The patient agreed with the plan and demonstrated an understanding of the instructions.    Time spent 20 min  [name] Jackson, MD

## 2020-12-12 NOTE — Telephone Encounter (Signed)
For your information  

## 2021-01-07 ENCOUNTER — Telehealth (HOSPITAL_COMMUNITY): Payer: Self-pay | Admitting: *Deleted

## 2021-01-07 DIAGNOSIS — F900 Attention-deficit hyperactivity disorder, predominantly inattentive type: Secondary | ICD-10-CM

## 2021-01-07 MED ORDER — METHYLPHENIDATE HCL 5 MG PO TABS: ORAL_TABLET | ORAL | 0 refills | Status: DC

## 2021-01-07 NOTE — Telephone Encounter (Signed)
Done

## 2021-01-07 NOTE — Telephone Encounter (Signed)
Pt called requesting a refill of Ritalin 5 mg, last filled 11/25/20. Pt has an appointment scheduled for 02/25/21.

## 2021-01-27 DIAGNOSIS — Z23 Encounter for immunization: Secondary | ICD-10-CM | POA: Diagnosis not present

## 2021-02-05 ENCOUNTER — Telehealth (HOSPITAL_COMMUNITY): Payer: Self-pay | Admitting: *Deleted

## 2021-02-05 DIAGNOSIS — F900 Attention-deficit hyperactivity disorder, predominantly inattentive type: Secondary | ICD-10-CM

## 2021-02-05 MED ORDER — METHYLPHENIDATE HCL 5 MG PO TABS: ORAL_TABLET | ORAL | 0 refills | Status: DC

## 2021-02-05 NOTE — Telephone Encounter (Signed)
Send to [facility].

## 2021-02-05 NOTE — Telephone Encounter (Signed)
Pt called requesting a refill of Ritalin 5 mg. Pt has an upcoming appointment on 02/25/21. Please review. Thanks.

## 2021-02-19 ENCOUNTER — Other Ambulatory Visit (HOSPITAL_COMMUNITY): Payer: Self-pay | Admitting: Psychiatry

## 2021-02-19 DIAGNOSIS — F331 Major depressive disorder, recurrent, moderate: Secondary | ICD-10-CM

## 2021-02-21 ENCOUNTER — Ambulatory Visit: Payer: Medicare Other

## 2021-02-25 ENCOUNTER — Encounter (HOSPITAL_COMMUNITY): Payer: Self-pay | Admitting: Psychiatry

## 2021-02-25 ENCOUNTER — Telehealth (HOSPITAL_BASED_OUTPATIENT_CLINIC_OR_DEPARTMENT_OTHER): Payer: Medicare Other | Admitting: Psychiatry

## 2021-02-25 ENCOUNTER — Other Ambulatory Visit: Payer: Self-pay

## 2021-02-25 ENCOUNTER — Telehealth (HOSPITAL_COMMUNITY): Payer: Medicare Other | Admitting: Psychiatry

## 2021-02-25 DIAGNOSIS — F900 Attention-deficit hyperactivity disorder, predominantly inattentive type: Secondary | ICD-10-CM

## 2021-02-25 DIAGNOSIS — F331 Major depressive disorder, recurrent, moderate: Secondary | ICD-10-CM | POA: Diagnosis not present

## 2021-02-25 MED ORDER — LAMOTRIGINE 200 MG PO TABS: 200.0000 mg | ORAL_TABLET | ORAL | 0 refills | Status: DC

## 2021-02-25 MED ORDER — TRINTELLIX 20 MG PO TABS: 20.0000 mg | ORAL_TABLET | ORAL | 2 refills | Status: DC

## 2021-02-25 MED ORDER — TRAZODONE HCL 50 MG PO TABS: 50.0000 mg | ORAL_TABLET | Freq: Every evening | ORAL | 0 refills | Status: DC | PRN

## 2021-02-25 MED ORDER — METHYLPHENIDATE HCL 5 MG PO TABS: ORAL_TABLET | ORAL | 0 refills | Status: DC

## 2021-02-25 NOTE — Progress Notes (Signed)
Virtual Visit via Telephone Note  I connected with [name] on 02/25/21 at  2:00 PM EDT by telephone and verified that I am speaking with the correct person using two identifiers.  Location: Patient: Work Provider: Biomedical scientist   I discussed the limitations, risks, security and privacy concerns of performing an evaluation and management service by telephone and the availability of in person appointments. I also discussed with the patient that there may be a patient responsible charge related to this service. The patient expressed understanding and agreed to proceed.   History of Present Illness: Patient is evaluated by phone session.  She is currently at work.  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  She is happy things are going well.  Recently she had a really good time with the grandkids and she went to [place] to see them.  She has 2 grandkids live in [place] and 2 in [place].  She is also pleased that her husband is making recovery from cancer of tongue and his wounds are healing.  Patient reported her attention focus is good and she is able to do the multitasking but sometimes she feels tired in the afternoon.  She does not want to increase the Ritalin but she takes it every day.  She denies any crying spells, feeling of hopelessness or any paranoia.  She is taking all her medication as prescribed and reported no tremors or shakes or any EPS.  She is now taking trazodone and her sleep is good.  Her appetite is okay and her weight is stable.  She is scheduled to have blood work with her cancer physician in coming weeks.  Past Psychiatric History:  H/O mania, irritability, impulsive behavior and overdose at age 23. H/O inpatient twice. Had psychological testing and diagnosed ADD. Took Concerta, (Adderall, Vyvanse made anxious) Wellbutrin, Prozac, lithium, Effexor, Lexapro, Abilify and Cymbalta.  Best when given female hormone to help endometriosis.   Had ECT, did not work. TMS helped. No history of psychosis.    Psychiatric Specialty Exam: Physical Exam  Review of Systems  Weight 155 lb (70.3 kg).Body mass index is 28.35 kg/m.  General Appearance: NA  Eye Contact:  NA  Speech:  Normal Rate  Volume:  Normal  Mood:  Euthymic  Affect:  NA  Thought Process:  Goal Directed  Orientation:  Full (Time, Place, and Person)  Thought Content:  Logical  Suicidal Thoughts:  No  Homicidal Thoughts:  No  Memory:  Immediate;   Fair Recent;   Good Remote;   Good  Judgement:  Intact  Insight:  Present  Psychomotor Activity:  NA  Concentration:  Concentration: Good and Attention Span: Good  Recall:  Good  Fund of Knowledge:  Good  Language:  Good  Akathisia:  No  Handed:  Right  AIMS (if indicated):     Assets:  Communication Skills Desire for Improvement Housing Physical Health Resilience Social Support Transportation  ADL's:  Intact  Cognition:  WNL  Sleep:   ok      Assessment and Plan: Major depressive disorder, recurrent.  ADD, inattentive type.  Patient is stable on her current medication.  She wants to keep the current medication.  Continue trazodone 50 mg at bedtime, Lamictal 200 mg daily, Ritalin 5 mg in the morning and Trintellix 20 mg in the morning.  Recommended to call us back if she is any question or any concern.  Follow-up in 3 months.  Follow Up  Instructions:    I discussed the assessment and treatment plan with the patient. The patient was provided an opportunity to ask questions and all were answered. The patient agreed with the plan and demonstrated an understanding of the instructions.   The patient was advised to call back or seek an in-person evaluation if the symptoms worsen or if the condition fails to improve as anticipated.  I provided 14 minutes of non-face-to-face time during this encounter.   Kathlee Nations, MD

## 2021-03-14 ENCOUNTER — Other Ambulatory Visit: Payer: Self-pay | Admitting: Internal Medicine

## 2021-03-14 DIAGNOSIS — N959 Unspecified menopausal and perimenopausal disorder: Secondary | ICD-10-CM

## 2021-04-09 ENCOUNTER — Telehealth: Payer: Self-pay | Admitting: Internal Medicine

## 2021-04-09 ENCOUNTER — Ambulatory Visit (INDEPENDENT_AMBULATORY_CARE_PROVIDER_SITE_OTHER): Payer: Medicare Other

## 2021-04-09 ENCOUNTER — Telehealth (HOSPITAL_COMMUNITY): Payer: Self-pay | Admitting: *Deleted

## 2021-04-09 ENCOUNTER — Telehealth: Payer: Self-pay

## 2021-04-09 VITALS — Ht 62.0 in | Wt 155.0 lb

## 2021-04-09 DIAGNOSIS — Z0000 Encounter for general adult medical examination without abnormal findings: Secondary | ICD-10-CM | POA: Diagnosis not present

## 2021-04-09 DIAGNOSIS — F900 Attention-deficit hyperactivity disorder, predominantly inattentive type: Secondary | ICD-10-CM

## 2021-04-09 MED ORDER — METHYLPHENIDATE HCL 5 MG PO TABS: ORAL_TABLET | ORAL | 0 refills | Status: DC

## 2021-04-09 NOTE — Telephone Encounter (Signed)
-----   Message from Dia Crawford, LPN sent at 58/07/938  4:30 PM EST ----- Regarding: Patient update Patient wanted to make pcp aware her brother has a new strand of MDS. Her new concerns are her Lymph lab showing 4.2 last blood draw. Notes she has been extremely tired and about 3x per week will sleep a couple of hours during the day and go to bed at 9, sleeping through the night. Her next scheduled office visit is 06/2021. She stated she is in no hurry to do anything different but wanted you to know incase she needs new labs on or before upcoming appointment.   Thanks,  [name]

## 2021-04-09 NOTE — Progress Notes (Signed)
Subjective:   [name] is a 75 y.o. female who presents for Medicare Annual (Subsequent) preventive examination.  Review of Systems    No ROS.  Medicare Wellness Virtual Visit.  Visual/audio telehealth visit, UTA vital signs.   See social history for additional risk factors.   Cardiac Risk Factors include: advanced age (>38men, >56 women)     Objective:    Today's Vitals   04/09/21 1536  Weight: 155 lb (70.3 kg)  Height: 5\' 2"  (1.575 m)   Body mass index is 28.35 kg/m.  Advanced Directives 04/09/2021 07/05/2020 02/21/2020 01/05/2020 01/05/2020 06/30/2019 04/04/2019  Does Patient Have a Medical Advance Directive? Yes Yes Yes Yes Yes No Yes  Type of Paramedic of [place];Living will Living will;Healthcare Power of [place];Living will [place] -  Does patient want to make changes to medical advance directive? No - Patient declined - No - Patient declined - - - -  Copy of [place] in Chart? Yes - validated most recent copy scanned in chart (See row information) - No - copy requested Yes - validated most recent copy scanned in chart (See row information) - - -  Would patient like information on creating a medical advance directive? - - - - - - -  Some encounter information is confidential and restricted. Go to Review Flowsheets activity to see all data.    Current Medications (verified) Outpatient Encounter Medications as of 04/09/2021  Medication Sig   azithromycin (ZITHROMAX) 250 MG tablet 2 pills day 1 and 1 pill day 2-5 with food (Patient not taking: Reported on 12/11/2020)   calcium-vitamin D (OSCAL WITH D) 500-200 MG-UNIT tablet Take 2 tablets by mouth 2 (two) times daily.   cetirizine (ZYRTEC) 10 MG tablet Take 10 mg by mouth daily as needed for allergies.    estradiol (ESTRACE) 0.5 MG tablet TAKE ONE TABLET BY MOUTH DAILY    ipratropium (ATROVENT) 0.03 % nasal spray Place 2 sprays into both nostrils 3 (three) times daily. prn   lamoTRIgine (LAMICTAL) 200 MG tablet Take 1 tablet (200 mg total) by mouth every morning.   levothyroxine (SYNTHROID) 25 MCG tablet Take 1 tablet (25 mcg total) by mouth daily before breakfast. 30 minutes   Multiple Vitamins-Minerals (MULTIVITAMIN WITH MINERALS) tablet Take 1 tablet by mouth daily.   omeprazole (PRILOSEC) 20 MG capsule Take 1 capsule (20 mg total) by mouth daily. 30 minutes before lunch wait 3-4 hours after thyroid pill before taking   ondansetron (ZOFRAN-ODT) 4 MG disintegrating tablet Take 1 tablet (4 mg total) by mouth 2 (two) times daily as needed for nausea or vomiting. AND PRN if this continues see gastroenterology   Probiotic Product (PROBIOTIC PO) Take by mouth.   sodium chloride (OCEAN) 0.65 % SOLN nasal spray Place 2 sprays into both nostrils daily as needed for congestion. (Patient not taking: Reported on 12/11/2020)   traZODone (DESYREL) 50 MG tablet Take 1 tablet (50 mg total) by mouth at bedtime as needed for sleep.   TRINTELLIX 20 MG TABS tablet Take 1 tablet (20 mg total) by mouth every morning.   [DISCONTINUED] methylphenidate (RITALIN) 5 MG tablet Take one tab daily   No facility-administered encounter medications on file as of 04/09/2021.    Allergies (verified) Other and Codeine   History: Past Medical History:  Diagnosis Date   Allergy    Anemia    Carcinoma ([place])  Colon cancer ([place]) 01/2017   Colon polyps    Diverticulitis    01/2018 abscess hosp. 01/2018   Endometriosis    Fibromyalgia    Gastric polyps    GERD (gastroesophageal reflux disease)    Hemorrhoids    Hypothyroidism    Iron deficiency anemia 03/08/2017   Major depressive disorder, recurrent episode, mild (HCC)    Dr. Adele Schilder in [place] h/o ECT x 6 x and hosp x 2 and transcranial magnetic treatment    Miscarriage    x2   PONV (postoperative nausea and vomiting)    Skin cancer     arm, thigh and chest Dr. Evorn Gong    Sleep apnea    USES CPAP   UTI (urinary tract infection)    Past Surgical History:  Procedure Laterality Date   ABDOMINAL HYSTERECTOMY  1990   endometriosis   BREAST BIOPSY Left 2004   benign   CATARACT EXTRACTION W/PHACO Right 10/26/2018   Procedure: CATARACT EXTRACTION PHACO AND INTRAOCULAR LENS PLACEMENT ([place]) RIGHT TORIC LENS;  Surgeon: Leandrew Koyanagi, MD;  Location: [facility];  Service: Ophthalmology;  Laterality: Right;  sleep apnea   CATARACT EXTRACTION, BILATERAL Bilateral 05/2012   CHOLECYSTECTOMY  2001   COLONOSCOPY WITH PROPOFOL N/A 12/28/2016   Procedure: COLONOSCOPY WITH PROPOFOL;  Surgeon: Manya Silvas, MD;  Location: [facility] ENDOSCOPY;  Service: Endoscopy;  Laterality: N/A;   COLONOSCOPY WITH PROPOFOL N/A 01/17/2018   Procedure: COLONOSCOPY WITH PROPOFOL;  Surgeon: Manya Silvas, MD;  Location: [facility] ENDOSCOPY;  Service: Endoscopy;  Laterality: N/A;   ESOPHAGOGASTRODUODENOSCOPY (EGD) WITH PROPOFOL N/A 12/28/2016   Procedure: ESOPHAGOGASTRODUODENOSCOPY (EGD) WITH PROPOFOL;  Surgeon: Manya Silvas, MD;  Location: [facility] ENDOSCOPY;  Service: Endoscopy;  Laterality: N/A;   EYE SURGERY     lens left eye, cataract right eye    LAPAROSCOPIC RIGHT COLECTOMY Right 01/21/2017   Procedure: LAPAROSCOPIC ASSISTED RIGHT COLECTOMY;  Surgeon: Robert Bellow, MD;  Location: [facility] ORS;  Service: General;  Laterality: Right;   TONSILLECTOMY     Family History  Problem Relation Age of Onset   Depression Brother    Alcohol abuse Brother    Suicidality Brother    Stroke Mother    Hyperlipidemia Mother    Hypertension Mother    Clotting disorder Brother    Cancer Brother        MDS   Colon polyps Father    Melanoma Sister    Cancer Sister        skin MM   Stroke Maternal Grandfather    Melanoma Nephew    Bipolar disorder Neg Hx    Social History   Socioeconomic History   Marital status: Married    Spouse name: Not on  file   Number of children: Not on file   Years of education: Not on file   Highest education level: Not on file  Occupational History   Not on file  Tobacco Use   Smoking status: Never   Smokeless tobacco: Never  Vaping Use   Vaping Use: Never used  Substance and Sexual Activity   Alcohol use: Not Currently    Alcohol/week: 0.0 standard drinks    Comment:     Drug use: No   Sexual activity: Yes    Partners: Male    Birth control/protection: None  Other Topics Concern   Not on file  Social History Narrative   Light smoker age 26 stopped    2 sons    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, housewife    Owns guns, wears seat belt, safe in relationship    Social Determinants of Health   Financial Resource Strain: Low Risk    Difficulty of Paying Living Expenses: Not hard at all  Food Insecurity: No Food Insecurity   Worried About Charity fundraiser in the Last Year: Never true   Arboriculturist in the Last Year: Never true  Transportation Needs: No Transportation Needs   Lack of Transportation (Medical): No   Lack of Transportation (Non-Medical): No  Physical Activity: Not on file  Stress: No Stress Concern Present   Feeling of Stress : Not at all  Social Connections: Unknown   Frequency of Communication with Friends and Family: Not on file   Frequency of Social Gatherings with Friends and Family: Not on file   Attends Religious Services: Not on file   Active Member of Clubs or Organizations: Not on file   Attends Archivist Meetings: Not on file   Marital Status: Married    Tobacco Counseling Counseling given: Not Answered   Clinical Intake:  Pre-visit preparation completed: Yes        Diabetes: No  How often do you need to have someone help you when you read instructions, pamphlets, or other written materials from your doctor or pharmacy?: 1 - Never    Interpreter Needed?: No      Activities of Daily Living In your present state of health, do you  have any difficulty performing the following activities: 04/09/2021  Hearing? N  Vision? N  Difficulty concentrating or making decisions? N  Walking or climbing stairs? N  Dressing or bathing? N  Doing errands, shopping? N  Preparing Food and eating ? N  Using the Toilet? N  In the past six months, have you accidently leaked urine? N  Do you have problems with loss of bowel control? N  Managing your Medications? N  Managing your Finances? N  Housekeeping or managing your Housekeeping? N  Some recent data might be hidden    Patient Care Team: McLean-Scocuzza, Nino Glow, MD as PCP - General (Internal Medicine) Manya Silvas, MD (Inactive) (Gastroenterology) Bary Castilla Forest Gleason, MD (General Surgery) Clent Jacks, RN as Registered Nurse  Indicate any recent Medical Services you may have received from other than [facility] providers in the past year (date may be approximate).     Assessment:   This is a routine wellness examination for [place].  Virtual Visit via Telephone Note  I connected with  Tayanna Talford Sydnor on 04/09/21 at  3:30 PM EST by telephone and verified that I am speaking with the correct person using two identifiers.  Persons participating in the virtual visit: patient/Nurse Health Advisor   I discussed the limitations, risks, security and privacy concerns of performing an evaluation and management service by telephone and the availability of in person appointments. The patient expressed understanding and agreed to proceed.  Interactive audio and video telecommunications were attempted between this nurse and patient, however failed, due to patient having technical difficulties OR patient did not have access to video capability.  We continued and completed visit with audio only.  Some vital signs may be absent or patient reported.   Hearing/Vision screen Hearing Screening - Comments:: Patient is able to hear conversational tones without difficulty. No issues  reported. Vision Screening - Comments:: Cataract extraction, bilateral  Wears readers glasses  Dietary issues and exercise activities discussed: Current Exercise Habits: Home exercise routine,  Intensity: Mild Regular diet Good water intake   Goals Addressed             This Visit's Progress    Follow up with pcp as need       As needed       Depression Screen PHQ 2/9 Scores 04/09/2021 12/11/2020 06/18/2020 02/21/2020 01/12/2020 07/11/2019 02/20/2019  PHQ - 2 Score 0 0 0 0 0 0 0  PHQ- 9 Score - 5 0 0 0 - -  Exception Documentation - - - Other- indicate reason in comment box - - -  Not completed - - - Followed by psychiatrist every 6 month - - -    Fall Risk Fall Risk  04/09/2021 12/11/2020 06/18/2020 02/21/2020 01/12/2020  Falls in the past year? 0 0 0 0 0  Comment - - - - -  Number falls in past yr: - 0 0 0 0  Injury with Fall? - 0 0 - 0  Follow up Falls evaluation completed Falls evaluation completed Falls evaluation completed Falls evaluation completed Falls evaluation completed    [place]: Home free of loose throw rugs in walkways, pet beds, electrical cords, etc? Yes  Adequate lighting in your home to reduce risk of falls? Yes   ASSISTIVE DEVICES UTILIZED TO PREVENT FALLS: Life alert? No  Use of a cane, walker or w/c? No  Elevated toilet seat or a handicapped toilet? Yes   TIMED UP AND GO: Was the test performed? No .   Cognitive Function: Patient is alert and oriented x3.  MMSE - Mini Mental State Exam 11/24/2017  Orientation to time 5  Orientation to Place 5  Registration 3  Attention/ Calculation 2  Recall 3  Language- name 2 objects 2  Language- repeat 1  Language- follow 3 step command 3  Language- read & follow direction 1  Write a sentence 1  Copy design 1  Total score 27     6CIT Screen 02/20/2019  What Year? 0 points  What month? 0 points  What time? 0 points  Count back from 20 0 points  Months in reverse 0  points    Immunizations Immunization History  Administered Date(s) Administered   DTaP 02/15/2014   Fluad Quad(high Dose 65+) 01/12/2020, 01/29/2021   Influenza Split 03/08/2017   Influenza-Unspecified 02/01/2018, 01/30/2019   PFIZER(Purple Top)SARS-COV-2 Vaccination 05/22/2019, 06/13/2019, 01/03/2020, 10/04/2020   Pneumococcal Conjugate-13 02/15/2014   Pneumococcal Polysaccharide-23 12/25/2009, 05/31/2018   Tdap 08/10/2018, 09/07/2018   Zoster Recombinat (Shingrix) 12/09/2018, 02/15/2019   Screening Tests Health Maintenance  Topic Date Due   COVID-19 Vaccine (5 - Booster for [place] series) 04/25/2021 (Originally 11/29/2020)   MAMMOGRAM  07/16/2022   COLONOSCOPY (Pts 45-54yrs Insurance coverage will need to be confirmed)  01/18/2028   TETANUS/TDAP  09/06/2028   Pneumonia Vaccine 64+ Years old  Completed   INFLUENZA VACCINE  Completed   DEXA SCAN  Completed   Hepatitis C Screening  Completed   Zoster Vaccines- Shingrix  Completed   HPV VACCINES  Aged Out   Health Maintenance There are no preventive care reminders to display for this patient.  Lung Cancer Screening: (Low Dose CT Chest recommended if Age 46-80 years, 30 pack-year currently smoking OR have quit w/in 15years.) does not qualify.   Vision Screening: Recommended annual ophthalmology exams for early detection of glaucoma and other disorders of the eye.  Dental Screening: Recommended annual dental exams for proper oral hygiene  Community Resource Referral / Chronic  Care Management: CRR required this visit?  No   CCM required this visit?  No      Plan:   Keep all routine maintenance appointments.   I have personally reviewed and noted the following in the patient's chart:   Medical and social history Use of alcohol, tobacco or illicit drugs  Current medications and supplements including opioid prescriptions.  Functional ability and status Nutritional status Physical activity Advanced directives List of  other physicians Hospitalizations, surgeries, and ER visits in previous 12 months Vitals Screenings to include cognitive, depression, and falls Referrals and appointments  In addition, I have reviewed and discussed with patient certain preventive protocols, quality metrics, and best practice recommendations. A written personalized care plan for preventive services as well as general preventive health recommendations were provided to patient.     Varney Biles, LPN   08/0/2233

## 2021-04-09 NOTE — Telephone Encounter (Signed)
Patient wanted to make pcp aware her brother has a new strand of MDS. Her new concerns are her Lymph lab showing 4.2 last blood draw. Notes she has been extremely tired and about 3x per week will sleep a couple of hours during the day and go to bed at 9, sleeping through the night. Her next scheduled office visit is 06/2021. She stated she is in no hurry to do anything different but wanted you to know incase she needs new labs on or before upcoming appointment.     Does she want referral to hematology/oncology?

## 2021-04-09 NOTE — Telephone Encounter (Signed)
Pt worried about elevated lymphocytes FH brother MDS Can you contact pt for work up

## 2021-04-09 NOTE — Telephone Encounter (Signed)
Unable to reach patient for scheduled appointment. Left message to call the office back and reschedule.

## 2021-04-09 NOTE — Telephone Encounter (Signed)
Pt called requesting refill of Ritalin 5 mg. Pt has an upcoming appointment on 05/27/21. Please review.

## 2021-04-09 NOTE — Patient Instructions (Addendum)
  Ms. Prophete , Thank you for taking time to come for your Medicare Wellness Visit. I appreciate your ongoing commitment to your health goals. Please review the following plan we discussed and let me know if I can assist you in the future.   These are the goals we discussed:  Goals      Follow up with pcp as needed        This is a list of the screening recommended for you and due dates:  Health Maintenance  Topic Date Due   COVID-19 Vaccine (5 - Booster for Pfizer series) 04/25/2021*   Mammogram  07/16/2022   Colon Cancer Screening  01/18/2028   Tetanus Vaccine  09/06/2028   Pneumonia Vaccine  Completed   Flu Shot  Completed   DEXA scan (bone density measurement)  Completed   Hepatitis C Screening: USPSTF Recommendation to screen - Ages 32-79 yo.  Completed   Zoster (Shingles) Vaccine  Completed   HPV Vaccine  Aged Out  *Topic was postponed. The date shown is not the original due date.

## 2021-04-09 NOTE — Telephone Encounter (Signed)
Done

## 2021-04-10 NOTE — Telephone Encounter (Signed)
FYI

## 2021-04-11 NOTE — Telephone Encounter (Signed)
Spoke to pt regarding scheduling an appt in Jan. She felt it was not necessary and would be ok with keeping her appt in March.

## 2021-05-06 ENCOUNTER — Telehealth (HOSPITAL_COMMUNITY): Payer: Self-pay

## 2021-05-06 DIAGNOSIS — F900 Attention-deficit hyperactivity disorder, predominantly inattentive type: Secondary | ICD-10-CM

## 2021-05-06 MED ORDER — METHYLPHENIDATE HCL 5 MG PO TABS: 5.0000 mg | ORAL_TABLET | Freq: Every day | ORAL | 0 refills | Status: DC

## 2021-05-06 NOTE — Telephone Encounter (Signed)
New script send. She can pickup when her refill is due.

## 2021-05-06 NOTE — Telephone Encounter (Signed)
Patient called requesting a refill on her Methylphenidate (Ritalin) 5mg  to go to [facility] on [address] in [place]. Please review and advise. Thank you

## 2021-05-09 NOTE — Telephone Encounter (Signed)
NOTIFIED PATIENT °

## 2021-05-13 ENCOUNTER — Telehealth: Payer: Medicare Other | Admitting: Internal Medicine

## 2021-05-16 ENCOUNTER — Other Ambulatory Visit: Payer: Self-pay | Admitting: Internal Medicine

## 2021-05-16 DIAGNOSIS — K219 Gastro-esophageal reflux disease without esophagitis: Secondary | ICD-10-CM

## 2021-05-19 DIAGNOSIS — Z20822 Contact with and (suspected) exposure to covid-19: Secondary | ICD-10-CM | POA: Diagnosis not present

## 2021-05-26 ENCOUNTER — Other Ambulatory Visit: Payer: Medicare Other

## 2021-05-26 ENCOUNTER — Ambulatory Visit: Payer: Medicare Other | Admitting: Oncology

## 2021-05-27 ENCOUNTER — Encounter (HOSPITAL_COMMUNITY): Payer: Self-pay | Admitting: Psychiatry

## 2021-05-27 ENCOUNTER — Other Ambulatory Visit: Payer: Self-pay

## 2021-05-27 ENCOUNTER — Telehealth (HOSPITAL_BASED_OUTPATIENT_CLINIC_OR_DEPARTMENT_OTHER): Payer: Medicare Other | Admitting: Psychiatry

## 2021-05-27 DIAGNOSIS — F331 Major depressive disorder, recurrent, moderate: Secondary | ICD-10-CM | POA: Diagnosis not present

## 2021-05-27 DIAGNOSIS — F900 Attention-deficit hyperactivity disorder, predominantly inattentive type: Secondary | ICD-10-CM

## 2021-05-27 MED ORDER — METHYLPHENIDATE HCL 5 MG PO TABS: 5.0000 mg | ORAL_TABLET | Freq: Every day | ORAL | 0 refills | Status: DC

## 2021-05-27 MED ORDER — TRAZODONE HCL 50 MG PO TABS: 50.0000 mg | ORAL_TABLET | Freq: Every evening | ORAL | 0 refills | Status: DC | PRN

## 2021-05-27 MED ORDER — LAMOTRIGINE 200 MG PO TABS: 200.0000 mg | ORAL_TABLET | ORAL | 0 refills | Status: DC

## 2021-05-27 MED ORDER — TRINTELLIX 20 MG PO TABS: 20.0000 mg | ORAL_TABLET | ORAL | 2 refills | Status: DC

## 2021-05-27 NOTE — Progress Notes (Signed)
Virtual Visit via Telephone Note  I connected with [name] on 05/27/21 at  2:00 PM EST by telephone and verified that I am speaking with the correct person using two identifiers.  Location: Patient: Home Provider: Home Office   I discussed the limitations, risks, security and privacy concerns of performing an evaluation and management service by telephone and the availability of in person appointments. I also discussed with the patient that there may be a patient responsible charge related to this service. The patient expressed understanding and agreed to proceed.   History of Present Illness: Patient is evaluated by phone session.  She is taking all her medication as prescribed.  She had a good Christmas and able to see the grandkids.  One of her grand kid is going to new state of [place].  Patient is excited about him.  She is pleased that her her husband is now in complete remission from cancer of the tongue wounds are healed.  She tried to keep herself busy and volunteer work.  She works 1 day a week at and more days work at [employer].  She feels her depression and anxiety is stable.  She denies any crying spells or any feeling of hopelessness.  She denies any suicidal thoughts.  Her energy level is good.  Her appetite is okay and her weight is stable.  She sleeps good with the help of trazodone.  She has upcoming appointment with her PCP in few days.  She denies any rash, tremor or shakes or any EPS.  She excited about upcoming holiday and she had a plan to have a dinner with her husband restaurant.  She also had a plan to visit [place] in spring and she is excited about upcoming trip.  Her attention, focus is good.  She is able to do multitasking.  However recently she received a letter that her Ritalin is not covered on her insurance.  However she has no issue getting her prescription from the pharmacy.     Past Psychiatric History:  H/O mania, irritability, impulsive behavior  and overdose at age 76. H/O inpatient twice. Had psychological testing and diagnosed ADD. Took Concerta, (Adderall, Vyvanse made anxious) Wellbutrin, Prozac, lithium, Effexor, Lexapro, Abilify and Cymbalta.  Best when given female hormone to help endometriosis.  Had ECT, did not work. TMS helped. No history of psychosis.   Psychiatric Specialty Exam: Physical Exam  Review of Systems  Weight 155 lb (70.3 kg).There is no height or weight on file to calculate BMI.  General Appearance: NA  Eye Contact:  NA  Speech:  Clear and Coherent and Normal Rate  Volume:  Normal  Mood:  Euthymic  Affect:  NA  Thought Process:  Goal Directed  Orientation:  Full (Time, Place, and Person)  Thought Content:  WDL  Suicidal Thoughts:  No  Homicidal Thoughts:  No  Memory:  Immediate;   Good Recent;   Good Remote;   Good  Judgement:  Intact  Insight:  Present  Psychomotor Activity:  NA  Concentration:  Concentration: Good and Attention Span: Good  Recall:  Good  Fund of Knowledge:  Good  Language:  Good  Akathisia:  No  Handed:  Right  AIMS (if indicated):     Assets:  Communication Skills Desire for Improvement Housing Resilience Social Support Transportation  ADL's:  Intact  Cognition:  WNL  Sleep:   ok      Assessment and Plan: Major depressive disorder, recurrent.  ADD, inattentive type.  Patient is doing okay on her current medication.  She has no issues or concerns or side effects from the medication.  I recommend if she is having issues getting her Ritalin refilled then please contact our office and we will try to get prior authorization.  For now she will continue as she has no issues at the pharmacy.  Continue Ritalin 5 mg in the morning, Trintellix 20 mg in the morning, trazodone 50 mg at bedtime and Lamictal 200 mg daily.  Recommended to call us back if is any question or any concern.  Follow-up in 3 months.  Follow Up Instructions:    I discussed the assessment and treatment plan  with the patient. The patient was provided an opportunity to ask questions and all were answered. The patient agreed with the plan and demonstrated an understanding of the instructions.   The patient was advised to call back or seek an in-person evaluation if the symptoms worsen or if the condition fails to improve as anticipated.  I provided 17 minutes of non-face-to-face time during this encounter.   Kathlee Nations, MD

## 2021-05-29 ENCOUNTER — Other Ambulatory Visit (HOSPITAL_COMMUNITY): Payer: Self-pay | Admitting: Psychiatry

## 2021-05-29 DIAGNOSIS — F331 Major depressive disorder, recurrent, moderate: Secondary | ICD-10-CM

## 2021-06-11 DIAGNOSIS — U071 COVID-19: Secondary | ICD-10-CM | POA: Diagnosis not present

## 2021-06-15 ENCOUNTER — Encounter: Payer: Self-pay | Admitting: Internal Medicine

## 2021-06-16 DIAGNOSIS — M7061 Trochanteric bursitis, right hip: Secondary | ICD-10-CM | POA: Diagnosis not present

## 2021-06-16 DIAGNOSIS — M7631 Iliotibial band syndrome, right leg: Secondary | ICD-10-CM | POA: Diagnosis not present

## 2021-06-16 NOTE — Telephone Encounter (Signed)
I rec she go to [facility] walk in clinic today she will need to call for an appt there for walk in today Or  Does she want referral to [facility] ortho though Im not sure when they will be able to get her in

## 2021-06-16 NOTE — Telephone Encounter (Signed)
For your information  

## 2021-06-18 ENCOUNTER — Ambulatory Visit: Payer: Medicare Other | Admitting: Internal Medicine

## 2021-06-24 ENCOUNTER — Other Ambulatory Visit: Payer: Self-pay

## 2021-06-24 ENCOUNTER — Encounter: Payer: Self-pay | Admitting: Internal Medicine

## 2021-06-24 ENCOUNTER — Ambulatory Visit (INDEPENDENT_AMBULATORY_CARE_PROVIDER_SITE_OTHER): Payer: Medicare Other | Admitting: Internal Medicine

## 2021-06-24 VITALS — BP 125/80 | HR 66 | Temp 98.2°F | Ht 62.0 in | Wt 161.2 lb

## 2021-06-24 DIAGNOSIS — Z13220 Encounter for screening for lipoid disorders: Secondary | ICD-10-CM | POA: Diagnosis not present

## 2021-06-24 DIAGNOSIS — E039 Hypothyroidism, unspecified: Secondary | ICD-10-CM

## 2021-06-24 DIAGNOSIS — Z85038 Personal history of other malignant neoplasm of large intestine: Secondary | ICD-10-CM

## 2021-06-24 DIAGNOSIS — N959 Unspecified menopausal and perimenopausal disorder: Secondary | ICD-10-CM

## 2021-06-24 DIAGNOSIS — E2839 Other primary ovarian failure: Secondary | ICD-10-CM | POA: Diagnosis not present

## 2021-06-24 DIAGNOSIS — Z1231 Encounter for screening mammogram for malignant neoplasm of breast: Secondary | ICD-10-CM

## 2021-06-24 DIAGNOSIS — R112 Nausea with vomiting, unspecified: Secondary | ICD-10-CM

## 2021-06-24 DIAGNOSIS — Z1389 Encounter for screening for other disorder: Secondary | ICD-10-CM

## 2021-06-24 DIAGNOSIS — M7631 Iliotibial band syndrome, right leg: Secondary | ICD-10-CM

## 2021-06-24 DIAGNOSIS — M8580 Other specified disorders of bone density and structure, unspecified site: Secondary | ICD-10-CM

## 2021-06-24 DIAGNOSIS — M25559 Pain in unspecified hip: Secondary | ICD-10-CM

## 2021-06-24 DIAGNOSIS — N760 Acute vaginitis: Secondary | ICD-10-CM

## 2021-06-24 DIAGNOSIS — D72820 Lymphocytosis (symptomatic): Secondary | ICD-10-CM | POA: Diagnosis not present

## 2021-06-24 MED ORDER — LEVOTHYROXINE SODIUM 25 MCG PO TABS: 25.0000 ug | ORAL_TABLET | Freq: Every day | ORAL | 3 refills | Status: DC

## 2021-06-24 MED ORDER — ONDANSETRON 4 MG PO TBDP: 4.0000 mg | ORAL_TABLET | Freq: Two times a day (BID) | ORAL | 3 refills | Status: DC | PRN

## 2021-06-24 MED ORDER — ESTRADIOL 0.5 MG PO TABS: 0.5000 mg | ORAL_TABLET | Freq: Every day | ORAL | 3 refills | Status: DC

## 2021-06-24 NOTE — Progress Notes (Signed)
Chief Complaint  Patient presents with   Follow-up    6 mo   F/u  1. Hypothyroidim on levo 25 mcg qd doing well  2. H/o colon cancer and lymphocytosis appt Dr. Tasia Catchings upcoming 07/2021 labs and f/u and 08/06/20 had colonoscopy and or EGD will get records no recurrence  3. Greater trochanteric and IL Band syndrome right leg f/u [facility] on mobic 7.5 mg qd   Review of Systems  Constitutional:  Negative for weight loss.  HENT:  Negative for hearing loss.   Eyes:  Negative for blurred vision.  Respiratory:  Negative for shortness of breath.   Cardiovascular:  Negative for chest pain.  Gastrointestinal:  Negative for abdominal pain and blood in stool.  Genitourinary:  Negative for dysuria.  Musculoskeletal:  Negative for falls and joint pain.  Skin:  Negative for rash.  Neurological:  Negative for headaches.  Psychiatric/Behavioral:  Negative for depression.   Past Medical History:  Diagnosis Date   Allergy    Anemia    Carcinoma ([place])    Colon cancer ([place]) 01/2017   Colon polyps    Diverticulitis    01/2018 abscess hosp. 01/2018   Endometriosis    Fibromyalgia    Gastric polyps    GERD (gastroesophageal reflux disease)    Hemorrhoids    Hypothyroidism    Iron deficiency anemia 03/08/2017   Major depressive disorder, recurrent episode, mild (HCC)    Dr. Adele Schilder in [place] h/o ECT x 6 x and hosp x 2 and transcranial magnetic treatment    Miscarriage    x2   PONV (postoperative nausea and vomiting)    Skin cancer    arm, thigh and chest Dr. Evorn Gong    Sleep apnea    USES CPAP   UTI (urinary tract infection)    Past Surgical History:  Procedure Laterality Date   ABDOMINAL HYSTERECTOMY  1990   endometriosis   BREAST BIOPSY Left 2004   benign   CATARACT EXTRACTION W/PHACO Right 10/26/2018   Procedure: CATARACT EXTRACTION PHACO AND INTRAOCULAR LENS PLACEMENT ([place]) RIGHT TORIC LENS;  Surgeon: Leandrew Koyanagi, MD;  Location: [place];  Service: Ophthalmology;  Laterality:  Right;  sleep apnea   CATARACT EXTRACTION, BILATERAL Bilateral 05/2012   CHOLECYSTECTOMY  2001   COLONOSCOPY WITH PROPOFOL N/A 12/28/2016   Procedure: COLONOSCOPY WITH PROPOFOL;  Surgeon: Manya Silvas, MD;  Location: [facility] ENDOSCOPY;  Service: Endoscopy;  Laterality: N/A;   COLONOSCOPY WITH PROPOFOL N/A 01/17/2018   Procedure: COLONOSCOPY WITH PROPOFOL;  Surgeon: Manya Silvas, MD;  Location: [facility] ENDOSCOPY;  Service: Endoscopy;  Laterality: N/A;   ESOPHAGOGASTRODUODENOSCOPY (EGD) WITH PROPOFOL N/A 12/28/2016   Procedure: ESOPHAGOGASTRODUODENOSCOPY (EGD) WITH PROPOFOL;  Surgeon: Manya Silvas, MD;  Location: [facility] ENDOSCOPY;  Service: Endoscopy;  Laterality: N/A;   EYE SURGERY     lens left eye, cataract right eye    LAPAROSCOPIC RIGHT COLECTOMY Right 01/21/2017   Procedure: LAPAROSCOPIC ASSISTED RIGHT COLECTOMY;  Surgeon: Robert Bellow, MD;  Location: [facility] ORS;  Service: General;  Laterality: Right;   TONSILLECTOMY     Family History  Problem Relation Age of Onset   Depression Brother    Alcohol abuse Brother    Suicidality Brother    Stroke Mother    Hyperlipidemia Mother    Hypertension Mother    Clotting disorder Brother    Cancer Brother        MDS   Colon polyps Father    Melanoma Sister  Cancer Sister        skin MM   Stroke Maternal Grandfather    Melanoma Nephew    Bipolar disorder Neg Hx    Social History   Socioeconomic History   Marital status: Married    Spouse name: Not on file   Number of children: Not on file   Years of education: Not on file   Highest education level: Not on file  Occupational History   Not on file  Tobacco Use   Smoking status: Never   Smokeless tobacco: Never  Vaping Use   Vaping Use: Never used  Substance and Sexual Activity   Alcohol use: Not Currently    Alcohol/week: 0.0 standard drinks    Comment:     Drug use: No   Sexual activity: Yes    Partners: Male    Birth control/protection: None  Other Topics  Concern   Not on file  Social History Narrative   Light smoker age 86 stopped    2 sons    Married    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, housewife    Owns guns, wears seat belt, safe in relationship    Social Determinants of Radio broadcast assistant Strain: Low Risk    Difficulty of Paying Living Expenses: Not hard at all  Food Insecurity: No Food Insecurity   Worried About Charity fundraiser in the Last Year: Never true   Arboriculturist in the Last Year: Never true  Transportation Needs: No Transportation Needs   Lack of Transportation (Medical): No   Lack of Transportation (Non-Medical): No  Physical Activity: Not on file  Stress: No Stress Concern Present   Feeling of Stress : Not at all  Social Connections: Unknown   Frequency of Communication with Friends and Family: Not on file   Frequency of Social Gatherings with Friends and Family: Not on file   Attends Religious Services: Not on Electrical engineer or Organizations: Not on file   Attends Archivist Meetings: Not on file   Marital Status: Married  Human resources officer Violence: Not At Risk   Fear of Current or Ex-Partner: No   Emotionally Abused: No   Physically Abused: No   Sexually Abused: No   Current Meds  Medication Sig   calcium-vitamin D (OSCAL WITH D) 500-200 MG-UNIT tablet Take 2 tablets by mouth 2 (two) times daily.   cetirizine (ZYRTEC) 10 MG tablet Take 10 mg by mouth daily as needed for allergies.    ipratropium (ATROVENT) 0.03 % nasal spray Place 2 sprays into both nostrils 3 (three) times daily. prn   lamoTRIgine (LAMICTAL) 200 MG tablet Take 1 tablet (200 mg total) by mouth every morning.   methylphenidate (RITALIN) 5 MG tablet Take 1 tablet (5 mg total) by mouth daily in the afternoon. Do not fill until 06/09/2021   Multiple Vitamins-Minerals (MULTIVITAMIN WITH MINERALS) tablet Take 1 tablet by mouth daily.   omeprazole (PRILOSEC) 20 MG capsule TAKE ONE CAPSULE BY MOUTH DAILY 30 MINUTES PRIOR TO  LUNCH. MUST WAIT 3-4 HOURS AFTER THYROID PILL   Probiotic Product (PROBIOTIC PO) Take by mouth.   sodium chloride (OCEAN) 0.65 % SOLN nasal spray Place 2 sprays into both nostrils daily as needed for congestion.   traZODone (DESYREL) 50 MG tablet Take 1 tablet (50 mg total) by mouth at bedtime as needed for sleep.   TRINTELLIX 20 MG TABS tablet Take 1 tablet (20 mg total) by mouth every  morning.   [DISCONTINUED] estradiol (ESTRACE) 0.5 MG tablet TAKE ONE TABLET BY MOUTH DAILY   [DISCONTINUED] levothyroxine (SYNTHROID) 25 MCG tablet Take 1 tablet (25 mcg total) by mouth daily before breakfast. 30 minutes   [DISCONTINUED] ondansetron (ZOFRAN-ODT) 4 MG disintegrating tablet Take 1 tablet (4 mg total) by mouth 2 (two) times daily as needed for nausea or vomiting. AND PRN if this continues see gastroenterology   Allergies  Allergen Reactions   Other     BAND-AIDS IF LEFT ON FOR EXTENDED PERIODS-REDNESS   Codeine     Nausea   No results found for this or any previous visit (from the past 2160 hour(s)). Objective  Body mass index is 29.48 kg/m. Wt Readings from Last 3 Encounters:  06/24/21 161 lb 3.2 oz (73.1 kg)  04/09/21 155 lb (70.3 kg)  12/11/20 152 lb (68.9 kg)   Temp Readings from Last 3 Encounters:  06/24/21 98.2 F (36.8 C) (Oral)  07/05/20 97.6 F (36.4 C)  01/12/20 98.7 F (37.1 C) (Oral)   BP Readings from Last 3 Encounters:  06/24/21 125/80  07/05/20 129/73  01/12/20 124/68   Pulse Readings from Last 3 Encounters:  06/24/21 66  07/05/20 66  01/12/20 72    Physical Exam Vitals and nursing note reviewed.  Constitutional:      Appearance: Normal appearance. She is well-developed and well-groomed.  HENT:     Head: Normocephalic and atraumatic.  Eyes:     Conjunctiva/sclera: Conjunctivae normal.     Pupils: Pupils are equal, round, and reactive to light.  Cardiovascular:     Rate and Rhythm: Normal rate and regular rhythm.     Heart sounds: Normal heart sounds.  No murmur heard. Pulmonary:     Effort: Pulmonary effort is normal.     Breath sounds: Normal breath sounds.  Abdominal:     General: Abdomen is flat. Bowel sounds are normal.     Tenderness: There is no abdominal tenderness.  Musculoskeletal:        General: No tenderness.  Skin:    General: Skin is warm and dry.  Neurological:     General: No focal deficit present.     Mental Status: She is alert and oriented to person, place, and time. Mental status is at baseline.     Cranial Nerves: Cranial nerves 2-12 are intact.     Motor: Motor function is intact.     Coordination: Coordination is intact.     Gait: Gait is intact.  Psychiatric:        Attention and Perception: Attention and perception normal.        Mood and Affect: Mood and affect normal.        Speech: Speech normal.        Behavior: Behavior normal. Behavior is cooperative.        Thought Content: Thought content normal.        Cognition and Memory: Cognition and memory normal.        Judgment: Judgment normal.    Assessment  Plan  Greater trochanteric pain syndrome/ITB band syndrome  [facility]u [facility]  Unspecified menopausal and perimenopausal disorder - Plan: estradiol (ESTRACE) 0.5 MG tablet  Hypothyroidism, unspecified type - Plan: levothyroxine (SYNTHROID) 25 MCG tablet, TSH  Nausea and vomiting in adult - Plan: ondansetron (ZOFRAN-ODT) 4 MG disintegrating tablet  Osteopenia, unspecified location - Plan: DG Bone Density Estrogen deficiency - Plan: DG Bone Density  Lymphocytosis - Plan: Pathologist smear review  History of colon  cancer F/u Dr. Tasia Catchings and prn [facility]   HM Flu shot utd Tdap 02/15/14 and 08/10/18 utd prevnar pna 23 05/31/18 03/2013 zostervax  2/2 shingrix   hep C negative  covid 4/4 utd consider 5th dose    mammo 07/15/20 negative ordered    S/p hysterectomy out of age window pap  -pap 10/15/14 negative neg HPV    Colonoscopy 01/17/18 diverticulosis, [facility] Dr. Tiffany Kocher repeat in 2 years h/o  colon cancer -per pt needs smallest scope and not sure if wants f/u 01/18/20 consider CT entero in the future  07/17/19 GI appt [facility] Had 08/06/20 get records    DEXA 12/21/17 osteopenia consider repeat in 3-5 years    Light smoker age 84 y.o    Dermatology Dr. Inez Catalina office Q6 months h/o nmsc and mohs 06/2019 and 07/2019 Dr. Natale Lay  Derm due yearly in 2022 with Dr. Kellie Moor end of 07/2021    Former PCP Dr. Clayborn Bigness requested records today including carotid US from 01/2018 in system w/o result. Pt to call/send letter to get results faxed to [name]     Specialist Dr.Yu [facility] Dr. Kellie Moor derm Psych   Provider: Dr. Olivia Mackie McLean-Scocuzza-Internal Medicine

## 2021-06-24 NOTE — Patient Instructions (Addendum)
Consider pfizer bivalent shot if not had   Iliotibial Band Syndrome Rehab Ask your health care provider which exercises are safe for you. Do exercises exactly as told by your health care provider and adjust them as directed. It is normal to feel mild stretching, pulling, tightness, or discomfort as you do these exercises. Stop right away if you feel sudden pain or your pain gets significantly worse. Do not begin these exercises until told by your health care provider. Stretching and range-of-motion exercises These exercises warm up your muscles and joints and improve the movement and flexibility of your hip and pelvis. Quadriceps stretch, prone  Lie on your abdomen (prone position) on a firm surface, such as a bed or padded floor. Bend your left / right knee and reach back to hold your ankle or pant leg. If you cannot reach your ankle or pant leg, loop a belt around your foot and grab the belt instead. Gently pull your heel toward your buttocks. Your knee should not slide out to the side. You should feel a stretch in the front of your thigh and knee (quadriceps). Hold this position for __________ seconds. Repeat __________ times. Complete this exercise __________ times a day. Iliotibial band stretch An iliotibial band is a strong band of muscle tissue that runs from the outer side of your hip to the outer side of your thigh and knee. Lie on your side with your left / right leg in the top position. Bend both of your knees and grab your left / right ankle. Stretch out your bottom arm to help you balance. Slowly bring your top knee back so your thigh goes behind your trunk. Slowly lower your top leg toward the floor until you feel a gentle stretch on the outside of your left / right hip and thigh. If you do not feel a stretch and your knee will not fall farther, place the heel of your other foot on top of your knee and pull your knee down toward the floor with your foot. Hold this position for  __________ seconds. Repeat __________ times. Complete this exercise __________ times a day. Strengthening exercises These exercises build strength and endurance in your hip and pelvis. Endurance is the ability to use your muscles for a long time, even after they get tired. Straight leg raises, side-lying This exercise strengthens the muscles that rotate the leg at the hip and move it away from your body (hip abductors). Lie on your side with your left / right leg in the top position. Lie so your head, shoulder, hip, and knee line up. You may bend your bottom knee to help you balance. Roll your hips slightly forward so your hips are stacked directly over each other and your left / right knee is facing forward. Tense the muscles in your outer thigh and lift your top leg 4-6 inches (10-15 cm). Hold this position for __________ seconds. Slowly lower your leg to return to the starting position. Let your muscles relax completely before doing another repetition. Repeat __________ times. Complete this exercise __________ times a day. Leg raises, prone This exercise strengthens the muscles that move the hips backward (hip extensors). Lie on your abdomen (prone position) on your bed or a firm surface. You can put a pillow under your hips if that is more comfortable for your lower back. Bend your left / right knee so your foot is straight up in the air. Squeeze your buttocks muscles and lift your left / right thigh off  the bed. Do not let your back arch. Tense your thigh muscle as hard as you can without increasing any knee pain. Hold this position for __________ seconds. Slowly lower your leg to return to the starting position and allow it to relax completely. Repeat __________ times. Complete this exercise __________ times a day. Hip hike Stand sideways on a bottom step. Stand on your left / right leg with your other foot unsupported next to the step. You can hold on to a railing or wall for balance if  needed. Keep your knees straight and your torso square. Then lift your left / right hip up toward the ceiling. Slowly let your left / right hip lower toward the floor, past the starting position. Your foot should get closer to the floor. Do not lean or bend your knees. Repeat __________ times. Complete this exercise __________ times a day. This information is not intended to replace advice given to you by your health care provider. Make sure you discuss any questions you have with your health care provider. Document Revised: 06/28/2019 Document Reviewed: 06/28/2019 Elsevier Patient Education  [place].

## 2021-06-25 DIAGNOSIS — M25661 Stiffness of right knee, not elsewhere classified: Secondary | ICD-10-CM | POA: Insufficient documentation

## 2021-07-01 ENCOUNTER — Inpatient Hospital Stay: Payer: Medicare Other | Attending: Oncology

## 2021-07-01 ENCOUNTER — Other Ambulatory Visit: Payer: Self-pay

## 2021-07-01 DIAGNOSIS — Z85038 Personal history of other malignant neoplasm of large intestine: Secondary | ICD-10-CM | POA: Diagnosis not present

## 2021-07-01 DIAGNOSIS — D509 Iron deficiency anemia, unspecified: Secondary | ICD-10-CM | POA: Diagnosis not present

## 2021-07-01 LAB — CBC WITH DIFFERENTIAL/PLATELET
Abs Immature Granulocytes: 0.01 10*3/uL (ref 0.00–0.07)
Basophils Absolute: 0 10*3/uL (ref 0.0–0.1)
Basophils Relative: 1 %
Eosinophils Absolute: 0 10*3/uL (ref 0.0–0.5)
Eosinophils Relative: 0 %
HCT: 36.7 % (ref 36.0–46.0)
Hemoglobin: 12.2 g/dL (ref 12.0–15.0)
Immature Granulocytes: 0 %
Lymphocytes Relative: 53 %
Lymphs Abs: 4.1 10*3/uL — ABNORMAL HIGH (ref 0.7–4.0)
MCH: 31 pg (ref 26.0–34.0)
MCHC: 33.2 g/dL (ref 30.0–36.0)
MCV: 93.4 fL (ref 80.0–100.0)
Monocytes Absolute: 0.5 10*3/uL (ref 0.1–1.0)
Monocytes Relative: 6 %
Neutro Abs: 3.1 10*3/uL (ref 1.7–7.7)
Neutrophils Relative %: 40 %
Platelets: 239 10*3/uL (ref 150–400)
RBC: 3.93 MIL/uL (ref 3.87–5.11)
RDW: 12.7 % (ref 11.5–15.5)
WBC: 7.7 10*3/uL (ref 4.0–10.5)
nRBC: 0 % (ref 0.0–0.2)

## 2021-07-01 LAB — COMPREHENSIVE METABOLIC PANEL
ALT: 18 U/L (ref 0–44)
AST: 15 U/L (ref 15–41)
Albumin: 3.7 g/dL (ref 3.5–5.0)
Alkaline Phosphatase: 56 U/L (ref 38–126)
Anion gap: 8 (ref 5–15)
BUN: 12 mg/dL (ref 8–23)
CO2: 28 mmol/L (ref 22–32)
Calcium: 9.3 mg/dL (ref 8.9–10.3)
Chloride: 101 mmol/L (ref 98–111)
Creatinine, Ser: 0.97 mg/dL (ref 0.44–1.00)
GFR, Estimated: >60 mL/min (ref >60)
Glucose, Bld: 87 mg/dL (ref 70–99)
Potassium: 4.1 mmol/L (ref 3.5–5.1)
Sodium: 137 mmol/L (ref 135–145)
Total Bilirubin: 0.2 mg/dL — ABNORMAL LOW (ref 0.3–1.2)
Total Protein: 6.7 g/dL (ref 6.5–8.1)

## 2021-07-02 LAB — CEA: CEA: 1.2 ng/mL (ref 0.0–4.7)

## 2021-07-03 ENCOUNTER — Other Ambulatory Visit: Payer: Medicare Other

## 2021-07-03 DIAGNOSIS — M7631 Iliotibial band syndrome, right leg: Secondary | ICD-10-CM | POA: Diagnosis not present

## 2021-07-03 DIAGNOSIS — M25561 Pain in right knee: Secondary | ICD-10-CM | POA: Diagnosis not present

## 2021-07-03 DIAGNOSIS — M25551 Pain in right hip: Secondary | ICD-10-CM | POA: Diagnosis not present

## 2021-07-04 ENCOUNTER — Telehealth (HOSPITAL_COMMUNITY): Payer: Self-pay | Admitting: *Deleted

## 2021-07-04 ENCOUNTER — Ambulatory Visit
Admission: RE | Admit: 2021-07-04 | Discharge: 2021-07-04 | Disposition: A | Discharge status: Home / Self Care | Payer: Medicare Other | Source: Ambulatory Visit | Attending: Oncology | Admitting: Oncology

## 2021-07-04 ENCOUNTER — Other Ambulatory Visit: Payer: Medicare Other

## 2021-07-04 ENCOUNTER — Other Ambulatory Visit: Payer: Self-pay

## 2021-07-04 DIAGNOSIS — Z90710 Acquired absence of both cervix and uterus: Secondary | ICD-10-CM | POA: Diagnosis not present

## 2021-07-04 DIAGNOSIS — I700 Atherosclerosis of aorta: Secondary | ICD-10-CM | POA: Diagnosis not present

## 2021-07-04 DIAGNOSIS — F900 Attention-deficit hyperactivity disorder, predominantly inattentive type: Secondary | ICD-10-CM

## 2021-07-04 DIAGNOSIS — Z85038 Personal history of other malignant neoplasm of large intestine: Secondary | ICD-10-CM | POA: Diagnosis not present

## 2021-07-04 DIAGNOSIS — Z9049 Acquired absence of other specified parts of digestive tract: Secondary | ICD-10-CM | POA: Diagnosis not present

## 2021-07-04 DIAGNOSIS — C189 Malignant neoplasm of colon, unspecified: Secondary | ICD-10-CM | POA: Diagnosis not present

## 2021-07-04 DIAGNOSIS — K5730 Diverticulosis of large intestine without perforation or abscess without bleeding: Secondary | ICD-10-CM | POA: Diagnosis not present

## 2021-07-04 DIAGNOSIS — K6389 Other specified diseases of intestine: Secondary | ICD-10-CM | POA: Diagnosis not present

## 2021-07-04 MED ORDER — METHYLPHENIDATE HCL 5 MG PO TABS: 5.0000 mg | ORAL_TABLET | Freq: Every day | ORAL | 0 refills | Status: DC

## 2021-07-04 MED ORDER — IOHEXOL 300 MG/ML  SOLN
100.0000 mL | Freq: Once | INTRAMUSCULAR | Status: AC | PRN
  Administered 2021-07-04: 100 mL via INTRAVENOUS

## 2021-07-04 NOTE — Telephone Encounter (Signed)
Pt called requesting refill of the Ritalin 5 mg. Pt has an appointment scheduled for 08/26/21. Please review.  ?

## 2021-07-04 NOTE — Telephone Encounter (Signed)
Done

## 2021-07-08 ENCOUNTER — Inpatient Hospital Stay: Payer: Medicare Other | Attending: Oncology | Admitting: Oncology

## 2021-07-08 ENCOUNTER — Other Ambulatory Visit: Payer: Self-pay

## 2021-07-08 ENCOUNTER — Encounter: Payer: Self-pay | Admitting: Oncology

## 2021-07-08 VITALS — BP 132/70 | HR 69 | Temp 96.1°F | Wt 157.0 lb

## 2021-07-08 DIAGNOSIS — Z933 Colostomy status: Secondary | ICD-10-CM | POA: Diagnosis not present

## 2021-07-08 DIAGNOSIS — Z79899 Other long term (current) drug therapy: Secondary | ICD-10-CM | POA: Insufficient documentation

## 2021-07-08 DIAGNOSIS — D72820 Lymphocytosis (symptomatic): Secondary | ICD-10-CM | POA: Diagnosis not present

## 2021-07-08 DIAGNOSIS — Z85038 Personal history of other malignant neoplasm of large intestine: Secondary | ICD-10-CM | POA: Diagnosis not present

## 2021-07-08 DIAGNOSIS — K317 Polyp of stomach and duodenum: Secondary | ICD-10-CM

## 2021-07-08 NOTE — Progress Notes (Signed)
Hematology/Oncology Follow Up Note [facility] Telephone:([phone] [phone] Patient Care Team: McLean-Scocuzza, Nino Glow, MD as PCP - General (Internal Medicine) Manya Silvas, MD (Inactive) (Gastroenterology) Bary Castilla, Forest Gleason, MD (General Surgery) Clent Jacks, RN as Registered Nurse  REASON FOR VISIT Follow up for treatment of management of colon cancer and iron deficiency anemia.  HISTORY OF PRESENTING ILLNESS:  [name] is a @ 76 y.o.  female with PMH listed below who was referred to me for evaluation and management of colon cancer.  a colonoscopy done on 12/25/2016 by Dr. Vira Agar. Moves her bowels daily. Patient states she is nausea, states it started at the end of June .She states she was having pain in her left upper quadrant pain was seen in the ER on 12/24/2016 Upper and lower endoscopies dated 12/28/2016 reviewed which showed Cecal mass, biopsy-proven adenocarcinoma. Patient underwent laparoscopically assisted right hemicolectomy with ileotransverse colostomy. Pathology revealed pT3N0 adenocarcinoma, with perineural invasion and lymphvascular invasion  #  Cancer Stage: pT3N0cM0, stage colon cancer. Lymphovascular Invasion:  Present  Perineural Invasion:  Present   Declined adjuvant chemotherapy. MMR- intact.  #Repeat colonoscopy 01/17/2018 by Dr. Vira Agar showed diverticulosis in the sigmoid colon, in the descending colon and transverse colon.  Internal hemorrhoids.  No specimen collected.  #Nausea.  Chronic problem for her.Etiology unclear.    Interval History Patient presents for follow-up of colon cancer and iron deficiency anemia.  She reports feeling well. Denies weight loss, fever, chills, fatigue, night sweats.  No abdominal pain blood in stool.   Review of Systems  Constitutional:  Negative for chills, fever, malaise/fatigue and weight loss.  HENT:  Negative for sore throat.   Eyes:  Negative for redness.   Respiratory:  Negative for cough, shortness of breath and wheezing.   Cardiovascular:  Negative for chest pain, palpitations and leg swelling.  Gastrointestinal:  Positive for nausea. Negative for abdominal pain, blood in stool and vomiting.  Genitourinary:  Negative for dysuria.  Musculoskeletal:  Negative for myalgias.  Skin:  Negative for rash.  Neurological:  Negative for dizziness, tingling and tremors.  Endo/Heme/Allergies:  Does not bruise/bleed easily.  Psychiatric/Behavioral:  Negative for hallucinations.     MEDICAL HISTORY:  Past Medical History:  Diagnosis Date   Allergy    Anemia    Carcinoma ([place])    Colon cancer ([place]) 01/2017   Colon polyps    Diverticulitis    01/2018 abscess hosp. 01/2018   Endometriosis    Fibromyalgia    Gastric polyps    GERD (gastroesophageal reflux disease)    Hemorrhoids    Hypothyroidism    Iron deficiency anemia 03/08/2017   Major depressive disorder, recurrent episode, mild (HCC)    Dr. Adele Schilder in [place] h/o ECT x 6 x and hosp x 2 and transcranial magnetic treatment    Miscarriage    x2   PONV (postoperative nausea and vomiting)    Skin cancer    arm, thigh and chest Dr. Evorn Gong    Sleep apnea    USES CPAP   UTI (urinary tract infection)     SURGICAL HISTORY: Past Surgical History:  Procedure Laterality Date   ABDOMINAL HYSTERECTOMY  1990   endometriosis   BREAST BIOPSY Left 2004   benign   CATARACT EXTRACTION W/PHACO Right 10/26/2018   Procedure: CATARACT EXTRACTION PHACO AND INTRAOCULAR LENS PLACEMENT ([place]) RIGHT TORIC LENS;  Surgeon: Leandrew Koyanagi, MD;  Location: [place];  Service: Ophthalmology;  Laterality: Right;  sleep  apnea   CATARACT EXTRACTION, BILATERAL Bilateral 05/2012   CHOLECYSTECTOMY  2001   COLONOSCOPY WITH PROPOFOL N/A 12/28/2016   Procedure: COLONOSCOPY WITH PROPOFOL;  Surgeon: Manya Silvas, MD;  Location[facility]COPY;  Service: Endoscopy;  Laterality: N/A;   COLONOSCOPY WITH PROPOFOL  N/A 01/17/2018   Procedure: COLONOSCOPY WITH PROPOFOL;  Surgeon: Manya Silvas, MD;  Location: [facility] ENDOSCOPY;  Service: Endoscopy;  Laterality: N/A;   ESOPHAGOGASTRODUODENOSCOPY (EGD) WITH PROPOFOL N/A 12/28/2016   Procedure: ESOPHAGOGASTRODUODENOSCOPY (EGD) WITH PROPOFOL;  Surgeon: Manya Silvas, MD;  Location: [facility] ENDOSCOPY;  Service: Endoscopy;  Laterality: N/A;   EYE SURGERY     lens left eye, cataract right eye    LAPAROSCOPIC RIGHT COLECTOMY Right 01/21/2017   Procedure: LAPAROSCOPIC ASSISTED RIGHT COLECTOMY;  Surgeon: Robert Bellow, MD;  Location: [facility] ORS;  Service: General;  Laterality: Right;   TONSILLECTOMY      SOCIAL HISTORY: Social History   Socioeconomic History   Marital status: Married    Spouse name: Not on file   Number of children: Not on file   Years of education: Not on file   Highest education level: Not on file  Occupational History   Not on file  Tobacco Use   Smoking status: Never   Smokeless tobacco: Never  Vaping Use   Vaping Use: Never used  Substance and Sexual Activity   Alcohol use: Not Currently    Alcohol/week: 0.0 standard drinks    Comment:     Drug use: No   Sexual activity: Yes    Partners: Male    Birth control/protection: None  Other Topics Concern   Not on file  Social History Narrative   Light smoker age 49 stopped    2 sons    Married    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, housewife    Owns guns, wears seat belt, safe in relationship    Social Determinants of Radio broadcast assistant Strain: Low Risk    Difficulty of Paying Living Expenses: Not hard at all  Food Insecurity: No Food Insecurity   Worried About Charity fundraiser in the Last Year: Never true   Arboriculturist in the Last Year: Never true  Transportation Needs: No Transportation Needs   Lack of Transportation (Medical): No   Lack of Transportation (Non-Medical): No  Physical Activity: Not on file  Stress: No Stress Concern Present   Feeling of Stress : Not at  all  Social Connections: Unknown   Frequency of Communication with Friends and Family: Not on file   Frequency of Social Gatherings with Friends and Family: Not on file   Attends Religious Services: Not on file   Active Member of Clubs or Organizations: Not on file   Attends Archivist Meetings: Not on file   Marital Status: Married  Human resources officer Violence: Not At Risk   Fear of Current or Ex-Partner: No   Emotionally Abused: No   Physically Abused: No   Sexually Abused: No    FAMILY HISTORY: Family History  Problem Relation Age of Onset   Depression Brother    Alcohol abuse Brother    Suicidality Brother    Stroke Mother    Hyperlipidemia Mother    Hypertension Mother    Clotting disorder Brother    Cancer Brother        MDS   Colon polyps Father    Melanoma Sister    Cancer Sister  skin MM   Stroke Maternal Grandfather    Melanoma Nephew    Bipolar disorder Neg Hx     ALLERGIES:  is allergic to other and codeine.  MEDICATIONS:  Current Outpatient Medications  Medication Sig Dispense Refill   calcium-vitamin D (OSCAL WITH D) 500-200 MG-UNIT tablet Take 2 tablets by mouth 2 (two) times daily.     cetirizine (ZYRTEC) 10 MG tablet Take 10 mg by mouth daily as needed for allergies.      estradiol (ESTRACE) 0.5 MG tablet Take 1 tablet (0.5 mg total) by mouth daily. 90 tablet 3   ipratropium (ATROVENT) 0.03 % nasal spray Place 2 sprays into both nostrils 3 (three) times daily. prn 30 mL 2   lamoTRIgine (LAMICTAL) 200 MG tablet Take 1 tablet (200 mg total) by mouth every morning. 90 tablet 0   levothyroxine (SYNTHROID) 25 MCG tablet Take 1 tablet (25 mcg total) by mouth daily before breakfast. 30 minutes 90 tablet 3   meloxicam (MOBIC) 15 MG tablet Take 15 mg by mouth daily.     methylphenidate (RITALIN) 5 MG tablet Take 1 tablet (5 mg total) by mouth daily in the afternoon. Do not fill until 07/07/2021 30 tablet 0   Multiple Vitamins-Minerals  (MULTIVITAMIN WITH MINERALS) tablet Take 1 tablet by mouth daily.     omeprazole (PRILOSEC) 20 MG capsule TAKE ONE CAPSULE BY MOUTH DAILY 30 MINUTES PRIOR TO LUNCH. MUST WAIT 3-4 HOURS AFTER THYROID PILL 90 capsule 3   ondansetron (ZOFRAN-ODT) 4 MG disintegrating tablet Take 1 tablet (4 mg total) by mouth 2 (two) times daily as needed for nausea or vomiting. AND PRN if this continues see gastroenterology 180 tablet 3   Probiotic Product (PROBIOTIC PO) Take by mouth.     sodium chloride (OCEAN) 0.65 % SOLN nasal spray Place 2 sprays into both nostrils daily as needed for congestion. 30 mL 2   traZODone (DESYREL) 50 MG tablet Take 1 tablet (50 mg total) by mouth at bedtime as needed for sleep. 90 tablet 0   TRINTELLIX 20 MG TABS tablet Take 1 tablet (20 mg total) by mouth every morning. 30 tablet 2   No current facility-administered medications for this visit.      Marland Kitchen  PHYSICAL EXAMINATION: ECOG PERFORMANCE STATUS: 1 - Symptomatic but completely ambulatory Vitals:   07/08/21 1255  BP: 132/70  Pulse: 69  Temp: (!) 96.1 F (35.6 C)   Filed Weights   07/08/21 1255  Weight: 157 lb (71.2 kg)   Physical Exam Constitutional:      General: She is not in acute distress.    Appearance: She is not diaphoretic.  HENT:     Head: Normocephalic and atraumatic.     Mouth/Throat:     Pharynx: No oropharyngeal exudate.  Eyes:     General: No scleral icterus.    Pupils: Pupils are equal, round, and reactive to light.  Cardiovascular:     Rate and Rhythm: Normal rate and regular rhythm.     Heart sounds: No murmur heard. Pulmonary:     Effort: Pulmonary effort is normal. No respiratory distress.     Breath sounds: No rales.  Chest:     Chest wall: No tenderness.  Abdominal:     General: There is no distension.     Palpations: Abdomen is soft.     Tenderness: There is no abdominal tenderness.  Musculoskeletal:        General: Normal range of motion.     Cervical  back: Normal range of  motion and neck supple.  Skin:    General: Skin is warm and dry.     Findings: No erythema.  Neurological:     Mental Status: She is alert and oriented to person, place, and time.  Psychiatric:        Mood and Affect: Affect normal.      LABORATORY DATA:  I have reviewed the data as listed Lab Results  Component Value Date   WBC 7.7 07/01/2021   HGB 12.2 07/01/2021   HCT 36.7 07/01/2021   MCV 93.4 07/01/2021   PLT 239 07/01/2021   Recent Labs    07/01/21 1324  NA 137  K 4.1  CL 101  CO2 28  GLUCOSE 87  BUN 12  CREATININE 0.97  CALCIUM 9.3  GFRNONAA >60  PROT 6.7  ALBUMIN 3.7  AST 15  ALT 18  ALKPHOS 56  BILITOT 0.2*     RADIOGRAPHIC STUDIES: I have personally reviewed the radiological images as listed and agreed with the findings in the report. CT CHEST ABDOMEN PELVIS W CONTRAST  Result Date: 07/05/2021 CLINICAL DATA:  Follow-up colon cancer status post partial right colectomy * onc * EXAM: CT CHEST, ABDOMEN, AND PELVIS WITH CONTRAST TECHNIQUE: Multidetector CT imaging of the chest, abdomen and pelvis was performed following the standard protocol during bolus administration of intravenous contrast. RADIATION DOSE REDUCTION: This exam was performed according to the departmental dose-optimization program which includes automated exposure control, adjustment of the mA and/or kV according to patient size and/or use of iterative reconstruction technique. CONTRAST:  142m OMNIPAQUE IOHEXOL 300 MG/ML SOLN, additional oral enteric contrast COMPARISON:  07/02/2020 FINDINGS: CT CHEST FINDINGS Cardiovascular: Aortic atherosclerosis. Normal heart size. No pericardial effusion. Mediastinum/Nodes: No enlarged mediastinal, hilar, or axillary lymph nodes. Thyroid gland, trachea, and esophagus demonstrate no significant findings. Lungs/Pleura: Lungs are clear. No pleural effusion or pneumothorax. Musculoskeletal: No chest wall mass or suspicious osseous lesions identified. CT ABDOMEN  PELVIS FINDINGS Hepatobiliary: No focal liver abnormality is seen. Status post cholecystectomy. No biliary dilatation. Pancreas: Unremarkable. No pancreatic ductal dilatation or surrounding inflammatory changes. Spleen: Normal in size without significant abnormality. Adrenals/Urinary Tract: Adrenal glands are unremarkable. Kidneys are normal, without renal calculi, solid lesion, or hydronephrosis. Bladder is unremarkable. Stomach/Bowel: Stomach is within normal limits. Status post partial right colectomy and ileocolic anastomosis. No evidence of bowel wall thickening, distention, or inflammatory changes. Descending and sigmoid diverticulosis. Vascular/Lymphatic: Scattered aortic atherosclerosis. No enlarged abdominal or pelvic lymph nodes. Reproductive: Status post hysterectomy. Other: No abdominal wall hernia or abnormality. No ascites. Musculoskeletal: No acute osseous findings. IMPRESSION: 1. Status post partial right colectomy and ileocolic anastomosis. 2. No evidence of recurrent or metastatic disease in the chest, abdomen, or pelvis. 3. Descending and sigmoid diverticulosis without evidence of acute diverticulitis. Aortic Atherosclerosis (ICD10-I70.0). Electronically Signed   By: ADelanna AhmadiM.D.   On: 07/05/2021 15:01     ASSESSMENT & PLAN:  1. History of colon cancer   2. Lymphocytosis   3. Gastric polyp    Cancer Staging  Cancer of right colon ([facility] Staging form: Colon and Rectum, AJCC 8th Edition - Clinical: cT1 - Unsigned - Pathologic stage from 03/08/2017: Stage IIA (pT3, pN0, cM0) - Signed by YEarlie Server MD on 03/08/2017  #History of stage IIA right side colon cancer (03/2017), with high risk feasures: Lymphovascular Invasion, Perineural Invasion:  Patient declined adjuvant chemotherapy. Labs are reviewed and discussed with patient CEA is stable and normal. Not elevated at baseline.  07/06/2021 CT chest abdomen pelvis w contrast showed no recurrence.  She is 4.5 year after surgery. Will  obtain one more annual CT scan.   History of multiple gastric polyps, biopsied on 12/28/2016. Benign pathology. Recommend patient to continue follow-up with gastroenterology. Marland Kitchen  #Leukocytosis Reactive vs underlying bone marrow disease. Check peripheral blood flowcytometry.   All questions were answered. The patient knows to call the clinic with any problems questions or concerns. We spent sufficient time to discuss many aspect of care, questions were answered to patient's satisfaction.  Return of visit:  6 months.  Orders Placed This Encounter  Procedures   CT CHEST ABDOMEN PELVIS W CONTRAST    Standing Status:   Future    Standing Expiration Date:   07/09/2022    Order Specific Question:   Preferred imaging location?    Answer:   [facility]    Order Specific Question:   Is Oral Contrast requested for this exam?    Answer:   Yes, Per Radiology protocol   Comprehensive metabolic panel    Standing Status:   Future    Standing Expiration Date:   07/09/2022   CBC with Differential/Platelet    Standing Status:   Future    Standing Expiration Date:   07/09/2022   CEA    Standing Status:   Future    Standing Expiration Date:   07/09/2022   Flow cytometry panel-leukemia/lymphoma work-up    Standing Status:   Future    Standing Expiration Date:   07/09/2022     Earlie Server, MD, PhD Hematology Oncology  07/08/2021

## 2021-07-09 ENCOUNTER — Telehealth: Payer: Self-pay | Admitting: *Deleted

## 2021-07-09 DIAGNOSIS — M25561 Pain in right knee: Secondary | ICD-10-CM | POA: Diagnosis not present

## 2021-07-09 DIAGNOSIS — M25551 Pain in right hip: Secondary | ICD-10-CM | POA: Diagnosis not present

## 2021-07-09 DIAGNOSIS — M7631 Iliotibial band syndrome, right leg: Secondary | ICD-10-CM | POA: Diagnosis not present

## 2021-07-09 NOTE — Telephone Encounter (Signed)
Patient requesting clarification of there 07/10/2022 appointment. She is a patient of Dr. Tasia Catchings the appointment is for Dr. Burlene Arnt. ?

## 2021-07-11 DIAGNOSIS — M25551 Pain in right hip: Secondary | ICD-10-CM | POA: Diagnosis not present

## 2021-07-11 DIAGNOSIS — M7631 Iliotibial band syndrome, right leg: Secondary | ICD-10-CM | POA: Diagnosis not present

## 2021-07-11 DIAGNOSIS — M25561 Pain in right knee: Secondary | ICD-10-CM | POA: Diagnosis not present

## 2021-07-15 DIAGNOSIS — Z20822 Contact with and (suspected) exposure to covid-19: Secondary | ICD-10-CM | POA: Diagnosis not present

## 2021-07-16 ENCOUNTER — Encounter: Payer: Self-pay | Admitting: Oncology

## 2021-07-25 DIAGNOSIS — M25561 Pain in right knee: Secondary | ICD-10-CM | POA: Diagnosis not present

## 2021-07-25 DIAGNOSIS — M25551 Pain in right hip: Secondary | ICD-10-CM | POA: Diagnosis not present

## 2021-07-25 DIAGNOSIS — M7631 Iliotibial band syndrome, right leg: Secondary | ICD-10-CM | POA: Diagnosis not present

## 2021-07-28 DIAGNOSIS — D2261 Melanocytic nevi of right upper limb, including shoulder: Secondary | ICD-10-CM | POA: Diagnosis not present

## 2021-07-28 DIAGNOSIS — L814 Other melanin hyperpigmentation: Secondary | ICD-10-CM | POA: Diagnosis not present

## 2021-07-28 DIAGNOSIS — M222X9 Patellofemoral disorders, unspecified knee: Secondary | ICD-10-CM | POA: Insufficient documentation

## 2021-07-28 DIAGNOSIS — M25561 Pain in right knee: Secondary | ICD-10-CM | POA: Diagnosis not present

## 2021-07-28 DIAGNOSIS — Z85828 Personal history of other malignant neoplasm of skin: Secondary | ICD-10-CM | POA: Diagnosis not present

## 2021-07-28 DIAGNOSIS — D225 Melanocytic nevi of trunk: Secondary | ICD-10-CM | POA: Diagnosis not present

## 2021-07-28 DIAGNOSIS — L570 Actinic keratosis: Secondary | ICD-10-CM | POA: Diagnosis not present

## 2021-07-30 DIAGNOSIS — M7631 Iliotibial band syndrome, right leg: Secondary | ICD-10-CM | POA: Diagnosis not present

## 2021-07-30 DIAGNOSIS — M25561 Pain in right knee: Secondary | ICD-10-CM | POA: Diagnosis not present

## 2021-07-30 DIAGNOSIS — M25551 Pain in right hip: Secondary | ICD-10-CM | POA: Diagnosis not present

## 2021-07-31 ENCOUNTER — Other Ambulatory Visit (HOSPITAL_COMMUNITY)
Admission: RE | Admit: 2021-07-31 | Discharge: 2021-07-31 | Disposition: A | Discharge status: Home / Self Care | Payer: Medicare Other | Source: Ambulatory Visit | Attending: Internal Medicine | Admitting: Internal Medicine

## 2021-07-31 ENCOUNTER — Other Ambulatory Visit (INDEPENDENT_AMBULATORY_CARE_PROVIDER_SITE_OTHER): Payer: Medicare Other

## 2021-07-31 DIAGNOSIS — D72829 Elevated white blood cell count, unspecified: Secondary | ICD-10-CM

## 2021-07-31 DIAGNOSIS — E039 Hypothyroidism, unspecified: Secondary | ICD-10-CM | POA: Diagnosis not present

## 2021-07-31 DIAGNOSIS — Z13220 Encounter for screening for lipoid disorders: Secondary | ICD-10-CM

## 2021-07-31 DIAGNOSIS — N760 Acute vaginitis: Secondary | ICD-10-CM

## 2021-07-31 DIAGNOSIS — Z1389 Encounter for screening for other disorder: Secondary | ICD-10-CM

## 2021-07-31 DIAGNOSIS — D72820 Lymphocytosis (symptomatic): Secondary | ICD-10-CM

## 2021-07-31 LAB — URINALYSIS, ROUTINE W REFLEX MICROSCOPIC
Bilirubin Urine: NEGATIVE
Ketones, ur: NEGATIVE
Leukocytes,Ua: NEGATIVE
Nitrite: NEGATIVE
Specific Gravity, Urine: 1.02 (ref 1.000–1.030)
Total Protein, Urine: NEGATIVE
Urine Glucose: NEGATIVE
Urobilinogen, UA: 0.2 (ref 0.0–1.0)
pH: 6 (ref 5.0–8.0)

## 2021-07-31 LAB — LIPID PANEL
Cholesterol: 179 mg/dL (ref 0–200)
HDL: 65.3 mg/dL (ref >39.00)
LDL Cholesterol: 96 mg/dL (ref 0–99)
NonHDL: 113.75
Total CHOL/HDL Ratio: 3
Triglycerides: 90 mg/dL (ref 0.0–149.0)
VLDL: 18 mg/dL (ref 0.0–40.0)

## 2021-07-31 LAB — TSH: TSH: 2.53 u[IU]/mL (ref 0.35–5.50)

## 2021-08-01 ENCOUNTER — Other Ambulatory Visit: Payer: Self-pay | Admitting: Oncology

## 2021-08-01 ENCOUNTER — Other Ambulatory Visit: Payer: Self-pay | Admitting: Internal Medicine

## 2021-08-01 ENCOUNTER — Telehealth: Payer: Self-pay

## 2021-08-01 DIAGNOSIS — B3731 Acute candidiasis of vulva and vagina: Secondary | ICD-10-CM

## 2021-08-01 LAB — URINE CYTOLOGY ANCILLARY ONLY
Bacterial Vaginitis-Urine: NEGATIVE
Candida Urine: POSITIVE — AB

## 2021-08-01 LAB — CBC WITH DIFFERENTIAL/PLATELET
Basophils Absolute: 0.1 10*3/uL (ref 0.0–0.2)
Basos: 1 %
EOS (ABSOLUTE): 0 10*3/uL (ref 0.0–0.4)
Eos: 0 %
Hematocrit: 39.7 % (ref 34.0–46.6)
Hemoglobin: 13.2 g/dL (ref 11.1–15.9)
Immature Grans (Abs): 0 10*3/uL (ref 0.0–0.1)
Immature Granulocytes: 0 %
Lymphocytes Absolute: 4.1 10*3/uL — ABNORMAL HIGH (ref 0.7–3.1)
Lymphs: 43 %
MCH: 30.6 pg (ref 26.6–33.0)
MCHC: 33.2 g/dL (ref 31.5–35.7)
MCV: 92 fL (ref 79–97)
Monocytes Absolute: 0.5 10*3/uL (ref 0.1–0.9)
Monocytes: 6 %
Neutrophils Absolute: 4.8 10*3/uL (ref 1.4–7.0)
Neutrophils: 50 %
Platelets: 268 10*3/uL (ref 150–450)
RBC: 4.31 x10E6/uL (ref 3.77–5.28)
RDW: 12.7 % (ref 11.7–15.4)
WBC: 9.5 10*3/uL (ref 3.4–10.8)

## 2021-08-01 LAB — PATHOLOGIST SMEAR REVIEW

## 2021-08-01 MED ORDER — FLUCONAZOLE 150 MG PO TABS: 150.0000 mg | ORAL_TABLET | Freq: Once | ORAL | 0 refills | Status: AC

## 2021-08-01 NOTE — Telephone Encounter (Signed)
-----   Message from Earlie Server, MD sent at 08/01/2021 12:41 PM EDT ----- ?Will check a flowcytometry.  ? ?Benjamine Mola, please arrange her to get flowcytometry done. -lab encounter.  ?----- Message ----- ?From: McLean-Scocuzza, Nino Glow, MD ?Sent: 08/01/2021   9:48 AM EDT ?To: Earlie Server, MD ? ?Urine trace blood  ?Please Re-order UA and culture with [facility] and have pt come back for this in 1-2 weeks ?Thyroid lab normal  ?Cholesterol improved great job with diet changes ?? ?Blood cts lymphocytes still elevated cc'ed hematology  ?and other urine test pending ? ?

## 2021-08-01 NOTE — Telephone Encounter (Signed)
Pt scheduled for lab on Tues 4/2.  ?

## 2021-08-04 DIAGNOSIS — R051 Acute cough: Secondary | ICD-10-CM | POA: Diagnosis not present

## 2021-08-04 DIAGNOSIS — R059 Cough, unspecified: Secondary | ICD-10-CM | POA: Diagnosis not present

## 2021-08-04 DIAGNOSIS — Z20822 Contact with and (suspected) exposure to covid-19: Secondary | ICD-10-CM | POA: Diagnosis not present

## 2021-08-05 ENCOUNTER — Inpatient Hospital Stay: Payer: Medicare Other | Attending: Oncology

## 2021-08-05 DIAGNOSIS — D72820 Lymphocytosis (symptomatic): Secondary | ICD-10-CM

## 2021-08-05 DIAGNOSIS — Z85038 Personal history of other malignant neoplasm of large intestine: Secondary | ICD-10-CM

## 2021-08-06 DIAGNOSIS — M25551 Pain in right hip: Secondary | ICD-10-CM | POA: Diagnosis not present

## 2021-08-06 DIAGNOSIS — M25561 Pain in right knee: Secondary | ICD-10-CM | POA: Diagnosis not present

## 2021-08-06 DIAGNOSIS — M7631 Iliotibial band syndrome, right leg: Secondary | ICD-10-CM | POA: Diagnosis not present

## 2021-08-07 ENCOUNTER — Telehealth (HOSPITAL_COMMUNITY): Payer: Self-pay | Admitting: *Deleted

## 2021-08-07 DIAGNOSIS — F900 Attention-deficit hyperactivity disorder, predominantly inattentive type: Secondary | ICD-10-CM

## 2021-08-07 LAB — COMP PANEL: LEUKEMIA/LYMPHOMA: Immunophenotypic Profile: 17

## 2021-08-07 MED ORDER — METHYLPHENIDATE HCL 5 MG PO TABS: 5.0000 mg | ORAL_TABLET | Freq: Every day | ORAL | 0 refills | Status: DC

## 2021-08-07 NOTE — Telephone Encounter (Signed)
Pt called requesting a refill of Ritalin 5 mg last fill date 07/07/21. Pt has an upcoming appointment on 08/26/21. Please review. ? ?

## 2021-08-07 NOTE — Telephone Encounter (Signed)
Done

## 2021-08-10 ENCOUNTER — Other Ambulatory Visit: Payer: Self-pay | Admitting: Oncology

## 2021-08-10 DIAGNOSIS — D72820 Lymphocytosis (symptomatic): Secondary | ICD-10-CM

## 2021-08-11 ENCOUNTER — Telehealth: Payer: Self-pay

## 2021-08-11 ENCOUNTER — Other Ambulatory Visit: Payer: Medicare Other

## 2021-08-11 NOTE — Telephone Encounter (Signed)
Called and informed patient of lab results. Informed her that we will keep a watch and repeat labs at her visit with Dr. Tasia Catchings next year. Patient verbalized understanding.  ?

## 2021-08-11 NOTE — Telephone Encounter (Signed)
-----   Message from Earlie Server, MD sent at 08/10/2021 10:52 AM EDT ----- ?Please let her know that her blood work up for elevated white count showed a small clone of monoclonal lymphocytes. This is a pre condition for low grade grade lymphoma. [ Not yet lymphoma].  ?This condition does not need treatment, but need to be watched.  ?We will repeat her testing at next visit.  ?

## 2021-08-18 DIAGNOSIS — Z20822 Contact with and (suspected) exposure to covid-19: Secondary | ICD-10-CM | POA: Diagnosis not present

## 2021-08-19 DIAGNOSIS — Z20822 Contact with and (suspected) exposure to covid-19: Secondary | ICD-10-CM | POA: Diagnosis not present

## 2021-08-21 ENCOUNTER — Telehealth: Payer: Self-pay | Admitting: *Deleted

## 2021-08-21 NOTE — Telephone Encounter (Signed)
Please place future orders for lab appt.  

## 2021-08-22 ENCOUNTER — Other Ambulatory Visit: Payer: Self-pay | Admitting: Internal Medicine

## 2021-08-22 ENCOUNTER — Other Ambulatory Visit: Payer: Medicare Other

## 2021-08-22 DIAGNOSIS — R319 Hematuria, unspecified: Secondary | ICD-10-CM

## 2021-08-23 DIAGNOSIS — K5732 Diverticulitis of large intestine without perforation or abscess without bleeding: Secondary | ICD-10-CM | POA: Diagnosis not present

## 2021-08-23 LAB — URINALYSIS, ROUTINE W REFLEX MICROSCOPIC
Bilirubin Urine: NEGATIVE
Glucose, UA: NEGATIVE
Hgb urine dipstick: NEGATIVE
Ketones, ur: NEGATIVE
Leukocytes,Ua: NEGATIVE
Nitrite: NEGATIVE
Protein, ur: NEGATIVE
Specific Gravity, Urine: 1.002 (ref 1.001–1.035)
pH: 6 (ref 5.0–8.0)

## 2021-08-23 LAB — URINE CULTURE
MICRO NUMBER:: 13296174
SPECIMEN QUALITY:: ADEQUATE

## 2021-08-24 ENCOUNTER — Other Ambulatory Visit (HOSPITAL_COMMUNITY): Payer: Self-pay | Admitting: Psychiatry

## 2021-08-24 DIAGNOSIS — F331 Major depressive disorder, recurrent, moderate: Secondary | ICD-10-CM

## 2021-08-26 ENCOUNTER — Telehealth (HOSPITAL_COMMUNITY): Payer: Medicare Other | Admitting: Psychiatry

## 2021-08-26 ENCOUNTER — Encounter (HOSPITAL_COMMUNITY): Payer: Self-pay | Admitting: Psychiatry

## 2021-08-26 ENCOUNTER — Telehealth (HOSPITAL_BASED_OUTPATIENT_CLINIC_OR_DEPARTMENT_OTHER): Payer: Medicare Other | Admitting: Psychiatry

## 2021-08-26 DIAGNOSIS — F331 Major depressive disorder, recurrent, moderate: Secondary | ICD-10-CM

## 2021-08-26 DIAGNOSIS — F900 Attention-deficit hyperactivity disorder, predominantly inattentive type: Secondary | ICD-10-CM

## 2021-08-26 MED ORDER — METHYLPHENIDATE HCL 5 MG PO TABS: 5.0000 mg | ORAL_TABLET | Freq: Every day | ORAL | 0 refills | Status: DC

## 2021-08-26 MED ORDER — TRAZODONE HCL 50 MG PO TABS: 50.0000 mg | ORAL_TABLET | Freq: Every evening | ORAL | 0 refills | Status: DC | PRN

## 2021-08-26 MED ORDER — TRINTELLIX 20 MG PO TABS: 20.0000 mg | ORAL_TABLET | ORAL | 2 refills | Status: DC

## 2021-08-26 MED ORDER — LAMOTRIGINE 200 MG PO TABS: 200.0000 mg | ORAL_TABLET | ORAL | 0 refills | Status: DC

## 2021-08-26 NOTE — Progress Notes (Signed)
Virtual Visit via Telephone Note ? ?I connected with [name] on 08/26/21 at  2:00 PM EDT by telephone and verified that I am speaking with the correct person using two identifiers. ? ?Location: ?Patient: Home ?Provider: Office ?  ?I discussed the limitations, risks, security and privacy concerns of performing an evaluation and management service by telephone and the availability of in person appointments. I also discussed with the patient that there may be a patient responsible charge related to this service. The patient expressed understanding and agreed to proceed. ? ? ?History of Present Illness: ?Patient is evaluated by phone session.  She reported chronic fatigue but denies any depressive thoughts.  She had blood work recently by her PCP and she was told everything is normal.  She is using CPAP every night but sometimes she has to take a nap during the day because she is so tired.  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  She remains active there.  She denies any crying spells or any feeling of hopelessness or worthlessness.  She denies any panic attack.  Recently she was given medication for diverticulitis.  She lost 10 pounds and she feels good but still have chronic fatigue which she is not sure what causing it.  She has no tremors, shakes, EPS.  She denies any suicidal thoughts.  Her attention and focus is good.  She is able to do multitasking. ? ?Past Psychiatric History:  ?H/O mania, irritability, impulsive behavior and overdose at age 76. H/O inpatient twice. Had psychological testing and diagnosed ADD. Took Concerta, (Adderall, Vyvanse made anxious) Wellbutrin, Prozac, lithium, Effexor, Lexapro, Abilify and Cymbalta.  Best when given female hormone to help endometriosis.  Had ECT, did not work. TMS helped. No history of psychosis.  ?  ?Psychiatric Specialty Exam: ?Physical Exam  ?Review of Systems  ?Weight 147 lb (66.7 kg).Body mass index is 26.89 kg/m?.   ?General Appearance: NA  ?Eye Contact:  NA  ?Speech:  Normal Rate  ?Volume:  Normal  ?Mood:   tired  ?Affect:  NA  ?Thought Process:  Goal Directed  ?Orientation:  Full (Time, Place, and Person)  ?Thought Content:  WDL  ?Suicidal Thoughts:  No  ?Homicidal Thoughts:  No  ?Memory:  Immediate;   Good ?Recent;   Good ?Remote;   Good  ?Judgement:  Good  ?Insight:  Present  ?Psychomotor Activity:  NA  ?Concentration:  Concentration: Good and Attention Span: Good  ?Recall:  Good  ?Fund of Knowledge:  Good  ?Language:  Good  ?Akathisia:  No  ?Handed:  Right  ?AIMS (if indicated):     ?Assets:  Communication Skills ?Desire for Improvement ?Housing ?Resilience ?Social Support ?Transportation  ?ADL's:  Intact  ?Cognition:  WNL  ?Sleep:   ok with CPAP  ? ? ? ? ?Assessment and Plan: ?Major depressive disorder, recurrent.  Attention deficit disorder, inattentive type. ? ?Discuss her chronic fatigue.  Recommend to have a sleep study if needed some adjustment of machine.  Patient denies any depressive thoughts.  She like to keep the current medication.  Continue Ritalin 5 mg in the morning, Trintellix 20 mg in the morning, trazodone 50 mg at bedtime and Lamictal 200 mg daily.  If patient continued to endorse chronic fatigue we may consider reducing the Lamictal dose in the next appointment.  Follow-up in 3 months.  I recommend to call us back if she has any question, concern or if she feels worsening  of the symptoms. ? ?Follow Up Instructions: ? ?  ?I discussed the assessment and treatment plan with the patient. The patient was provided an opportunity to ask questions and all were answered. The patient agreed with the plan and demonstrated an understanding of the instructions. ?  ?The patient was advised to call back or seek an in-person evaluation if the symptoms worsen or if the condition fails to improve as anticipated. ? ?Collaboration of Care: Primary Care Provider AEB notes are available in epic to  review. ? ?Patient/Guardian was advised Release of Information must be obtained prior to any record release in order to collaborate their care with an outside provider. Patient/Guardian was advised if they have not already done so to contact the registration department to sign all necessary forms in order for [name] to release information regarding their care.  ? ?Consent: Patient/Guardian gives verbal consent for treatment and assignment of benefits for services provided during this visit. Patient/Guardian expressed understanding and agreed to proceed.   ? ?I provided 20 minutes of non-face-to-face time during this encounter. ? ? ?Kathlee Nations, MD  ?

## 2021-08-30 ENCOUNTER — Encounter: Payer: Self-pay | Admitting: Internal Medicine

## 2021-09-01 ENCOUNTER — Ambulatory Visit (INDEPENDENT_AMBULATORY_CARE_PROVIDER_SITE_OTHER): Payer: Medicare Other | Admitting: Family

## 2021-09-01 ENCOUNTER — Other Ambulatory Visit (HOSPITAL_COMMUNITY)
Admission: RE | Admit: 2021-09-01 | Discharge: 2021-09-01 | Disposition: A | Discharge status: Home / Self Care | Payer: Medicare Other | Source: Ambulatory Visit | Attending: Family | Admitting: Family

## 2021-09-01 ENCOUNTER — Encounter: Payer: Self-pay | Admitting: Family

## 2021-09-01 VITALS — BP 130/70 | HR 70 | Temp 98.2°F | Ht 62.0 in | Wt 149.4 lb

## 2021-09-01 DIAGNOSIS — B3731 Acute candidiasis of vulva and vagina: Secondary | ICD-10-CM

## 2021-09-01 DIAGNOSIS — N760 Acute vaginitis: Secondary | ICD-10-CM | POA: Diagnosis not present

## 2021-09-01 DIAGNOSIS — Z85038 Personal history of other malignant neoplasm of large intestine: Secondary | ICD-10-CM | POA: Diagnosis not present

## 2021-09-01 MED ORDER — FLUCONAZOLE 150 MG PO TABS: 150.0000 mg | ORAL_TABLET | Freq: Once | ORAL | 1 refills | Status: AC

## 2021-09-01 NOTE — Assessment & Plan Note (Signed)
Presentation consistent with vulvovaginal candidiasis.  Will treat empirically based on intense pruritus with Diflucan.  Pending wet prep.  ?

## 2021-09-01 NOTE — Patient Instructions (Signed)
Start Diflucan ?Continue probiotics ?Please let me know if symptoms not completely resolve ? ?Vaginal Yeast Infection, Adult ? ?Vaginal yeast infection is a condition that causes vaginal discharge as well as soreness, swelling, and redness (inflammation) of the vagina. This is a common condition. Some women get this infection frequently. ?What are the causes? ?This condition is caused by a change in the normal balance of the yeast (Candida) and normal bacteria that live in the vagina. This change causes an overgrowth of yeast, which causes the inflammation. ?What increases the risk? ?The condition is more likely to develop in women who: ?Take antibiotic medicines. ?Have diabetes. ?Take birth control pills. ?Are pregnant. ?Douche often. ?Have a weak body defense system (immune system). ?Have been taking steroid medicines for a long time. ?Frequently wear tight clothing. ?What are the signs or symptoms? ?Symptoms of this condition include: ?White, thick, creamy vaginal discharge. ?Swelling, itching, redness, and irritation of the vagina. The lips of the vagina (labia) may be affected as well. ?Pain or a burning feeling while urinating. ?Pain during sex. ?How is this diagnosed? ?This condition is diagnosed based on: ?Your medical history. ?A physical exam. ?A pelvic exam. Your health care provider will examine a sample of your vaginal discharge under a microscope. Your health care provider may send this sample for testing to confirm the diagnosis. ?How is this treated? ?This condition is treated with medicine. Medicines may be over-the-counter or prescription. You may be told to use one or more of the following: ?Medicine that is taken by mouth (orally). ?Medicine that is applied as a cream (topically). ?Medicine that is inserted directly into the vagina (suppository). ?Follow these instructions at home: ?Take or apply over-the-counter and prescription medicines only as told by your health care provider. ?Do not use  tampons until your health care provider approves. ?Do not have sex until your infection has cleared. Sex can prolong or worsen your symptoms of infection. Ask your health care provider when it is safe to resume sexual activity. ?Keep all follow-up visits. This is important. ?How is this prevented? ? ?Do not wear tight clothes, such as pantyhose or tight pants. ?Wear breathable cotton underwear. ?Do not use douches, perfumed soap, creams, or powders. ?Wipe from front to back after using the toilet. ?If you have diabetes, keep your blood sugar levels under control. ?Ask your health care provider for other ways to prevent yeast infections. ?Contact a health care provider if: ?You have a fever. ?Your symptoms go away and then return. ?Your symptoms do not get better with treatment. ?Your symptoms get worse. ?You have new symptoms. ?You develop blisters in or around your vagina. ?You have blood coming from your vagina and it is not your menstrual period. ?You develop pain in your abdomen. ?Summary ?Vaginal yeast infection is a condition that causes discharge as well as soreness, swelling, and redness (inflammation) of the vagina. ?This condition is treated with medicine. Medicines may be over-the-counter or prescription. ?Take or apply over-the-counter and prescription medicines only as told by your health care provider. ?Do not douche. Resume sexual activity or use of tampons as instructed by your health care provider. ?Contact a health care provider if your symptoms do not get better with treatment or your symptoms go away and then return. ?This information is not intended to replace advice given to you by your health care provider. Make sure you discuss any questions you have with your health care provider. ?Document Revised: 07/08/2020 Document Reviewed: 07/08/2020 ?Elsevier Patient Education ? 2023  [employer]vier Inc[employer] ? ?

## 2021-09-01 NOTE — Telephone Encounter (Signed)
Pt scheduled for OV w/Margaret NP for possible yeast infection '@10'$ :15am ?

## 2021-09-01 NOTE — Assessment & Plan Note (Signed)
Advised she is due for colonoscopy. She declines referral at this time and states she will discuss with PCP, oncology , Dr Tasia Catchings ?

## 2021-09-01 NOTE — Progress Notes (Signed)
? ?Subjective:  ? ? Patient ID: [name], female    DOB: [birth year], 76 y.o.   MRN: [id number] ? ?CC: [name] is a 76 y.o. female who presents today for an acute visit.   ? ?HPI: Complains of intense vaginal itching externally x one week, improved. Tenderness when she wipes ?Tried monistat with some relief.  ?No unusual vaginal discharge, dysuria, fever.  ?H/o hysterectomy ?No h/o DM ?H/o hypothyroidism ?Recent h/o diverticulitis 08/23/21.  ?LLQ resolved. No fever, diarrhea ?She eats yogurt daily and takes probiotic daily ?Treated with augmentin 875- '125mg'$ . ? ?Colonoscopy due ?Follows with Dr Tasia Catchings for h/o colon cancer 2018.  ? ?HISTORY:  ?Past Medical History:  ?Diagnosis Date  ? Allergy   ? Anemia   ? Carcinoma ([place])   ? Colon cancer ([place]) 01/2017  ? Colon polyps   ? Diverticulitis   ? 01/2018 abscess hosp. 01/2018  ? Endometriosis   ? Fibromyalgia   ? Gastric polyps   ? GERD (gastroesophageal reflux disease)   ? Hemorrhoids   ? Hypothyroidism   ? Iron deficiency anemia 03/08/2017  ? Major depressive disorder, recurrent episode, mild ([place])   ? Dr. Adele Schilder in [place] h/o ECT x 6 x and hosp x 2 and transcranial magnetic treatment   ? Miscarriage   ? x2  ? PONV (postoperative nausea and vomiting)   ? Skin cancer   ? arm, thigh and chest Dr. Evorn Gong   ? Sleep apnea   ? USES CPAP  ? UTI (urinary tract infection)   ? ?Past Surgical History:  ?Procedure Laterality Date  ? ABDOMINAL HYSTERECTOMY  1990  ? endometriosis  ? BREAST BIOPSY Left 2004  ? benign  ? CATARACT EXTRACTION W/PHACO Right 10/26/2018  ? Procedure: CATARACT EXTRACTION PHACO AND INTRAOCULAR LENS PLACEMENT ([place]) RIGHT TORIC LENS;  Surgeon: Leandrew Koyanagi, MD;  Location: [facility];  Service: Ophthalmology;  Laterality: Right;  sleep apnea  ? CATARACT EXTRACTION, BILATERAL Bilateral 05/2012  ? CHOLECYSTECTOMY  2001  ? COLONOSCOPY WITH PROPOFOL N/A 12/28/2016  ? Procedure: COLONOSCOPY WITH PROPOFOL;  Surgeon: Manya Silvas,  MD;  Location[facility] ENDOSCOPY;  Service: Endoscopy;  Laterality: N/A;  ? COLONOSCOPY WITH PROPOFOL N/A 01/17/2018  ? Procedure: COLONOSCOPY WITH PROPOFOL;  Surgeon: Manya Silvas, MD;  Location: [facility] ENDOSCOPY;  Service: Endoscopy;  Laterality: N/A;  ? ESOPHAGOGASTRODUODENOSCOPY (EGD) WITH PROPOFOL N/A 12/28/2016  ? Procedure: ESOPHAGOGASTRODUODENOSCOPY (EGD) WITH PROPOFOL;  Surgeon: Manya Silvas, MD;  Location: [facility] ENDOSCOPY;  Service: Endoscopy;  Laterality: N/A;  ? EYE SURGERY    ? lens left eye, cataract right eye   ? LAPAROSCOPIC RIGHT COLECTOMY Right 01/21/2017  ? Procedure: LAPAROSCOPIC ASSISTED RIGHT COLECTOMY;  Surgeon: Robert Bellow, MD;  Location: [facility] ORS;  Service: General;  Laterality: Right;  ? TONSILLECTOMY    ? ?Family History  ?Problem Relation Age of Onset  ? Depression Brother   ? Alcohol abuse Brother   ? Suicidality Brother   ? Stroke Mother   ? Hyperlipidemia Mother   ? Hypertension Mother   ? Clotting disorder Brother   ? Cancer Brother   ?     MDS  ? Colon polyps Father   ? Melanoma Sister   ? Cancer Sister   ?     skin MM  ? Stroke Maternal Grandfather   ? Melanoma Nephew   ? Bipolar disorder Neg Hx   ? ? ?Allergies: Other and Codeine ?Current Outpatient Medications on File Prior to Visit  ?  Medication Sig Dispense Refill  ? calcium-vitamin D (OSCAL WITH D) 500-200 MG-UNIT tablet Take 2 tablets by mouth 2 (two) times daily.    ? cetirizine (ZYRTEC) 10 MG tablet Take 10 mg by mouth daily as needed for allergies.     ? estradiol (ESTRACE) 0.5 MG tablet Take 1 tablet (0.5 mg total) by mouth daily. 90 tablet 3  ? ipratropium (ATROVENT) 0.03 % nasal spray Place 2 sprays into both nostrils 3 (three) times daily. prn 30 mL 2  ? lamoTRIgine (LAMICTAL) 200 MG tablet Take 1 tablet (200 mg total) by mouth every morning. 90 tablet 0  ? levothyroxine (SYNTHROID) 25 MCG tablet Take 1 tablet (25 mcg total) by mouth daily before breakfast. 30 minutes 90 tablet 3  ? meloxicam (MOBIC) 15 MG tablet  Take 15 mg by mouth daily.    ? methylphenidate (RITALIN) 5 MG tablet Take 1 tablet (5 mg total) by mouth daily in the afternoon. 30 tablet 0  ? Multiple Vitamins-Minerals (MULTIVITAMIN WITH MINERALS) tablet Take 1 tablet by mouth daily.    ? omeprazole (PRILOSEC) 20 MG capsule TAKE ONE CAPSULE BY MOUTH DAILY 30 MINUTES PRIOR TO LUNCH. MUST WAIT 3-4 HOURS AFTER THYROID PILL 90 capsule 3  ? ondansetron (ZOFRAN-ODT) 4 MG disintegrating tablet Take 1 tablet (4 mg total) by mouth 2 (two) times daily as needed for nausea or vomiting. AND PRN if this continues see gastroenterology 180 tablet 3  ? Probiotic Product (PROBIOTIC PO) Take by mouth.    ? sodium chloride (OCEAN) 0.65 % SOLN nasal spray Place 2 sprays into both nostrils daily as needed for congestion. 30 mL 2  ? traZODone (DESYREL) 50 MG tablet Take 1 tablet (50 mg total) by mouth at bedtime as needed for sleep. 90 tablet 0  ? TRINTELLIX 20 MG TABS tablet Take 1 tablet (20 mg total) by mouth every morning. 30 tablet 2  ? ?No current facility-administered medications on file prior to visit.  ? ? ?Social History  ? ?Tobacco Use  ? Smoking status: Never  ? Smokeless tobacco: Never  ?Vaping Use  ? Vaping Use: Never used  ?Substance Use Topics  ? Alcohol use: Not Currently  ?  Alcohol/week: 0.0 standard drinks  ?  Comment:    ? Drug use: No  ? ? ?Review of Systems  ?Constitutional:  Negative for chills and fever.  ?Respiratory:  Negative for cough.   ?Cardiovascular:  Negative for chest pain and palpitations.  ?Gastrointestinal:  Negative for nausea and vomiting.  ?Genitourinary:  Positive for vaginal pain. Negative for difficulty urinating, dysuria and vaginal discharge.  ?Musculoskeletal:  Negative for back pain.  ?   ?Objective:  ?  ?BP 130/70 (BP Location: Left Arm, Patient Position: Sitting, Cuff Size: Normal)   Pulse 70   Temp 98.2 ?F (36.8 ?C) (Oral)   Ht '5\' 2"'$  (1.575 m)   Wt 149 lb 6.4 oz (67.8 kg)   LMP  (LMP Unknown)   SpO2 99%   BMI 27.33 kg/m?   ? ? ?Physical Exam ?Vitals reviewed.  ?Constitutional:   ?   Appearance: She is well-developed.  ?Eyes:  ?   Conjunctiva/sclera: Conjunctivae normal.  ?Cardiovascular:  ?   Rate and Rhythm: Normal rate and regular rhythm.  ?   Pulses: Normal pulses.  ?   Heart sounds: Normal heart sounds.  ?Pulmonary:  ?   Effort: Pulmonary effort is normal.  ?   Breath sounds: Normal breath sounds. No wheezing, rhonchi or rales.  ?  Genitourinary: ?   Labia:     ?   Right: No rash, tenderness or lesion.     ?   Left: No rash, tenderness or lesion.   ?   Vagina: No foreign body. No vaginal discharge, erythema, tenderness or bleeding.  ?   Cervix: No cervical motion tenderness or discharge.  ?   Adnexa:     ?   Right: No mass, tenderness or fullness.      ?   Left: No mass, tenderness or fullness.    ?   Comments: Intense vulvovaginal and labia erythema. No lesions. Discharge is thin and white, not purulent, in vaginal canal. No blood. No cervix appreciated  ?Skin: ?   General: Skin is warm and dry.  ?Neurological:  ?   Mental Status: She is alert.  ?Psychiatric:     ?   Speech: Speech normal.     ?   Behavior: Behavior normal.     ?   Thought Content: Thought content normal.  ? ? ?   ?Assessment & Plan:  ? ?Problem List Items Addressed This Visit   ? ?  ? Genitourinary  ? Vaginitis - Primary  ?  Presentation consistent with vulvovaginal candidiasis.  Will treat empirically based on intense pruritus with Diflucan.  Pending wet prep.  ? ?  ?  ? Relevant Medications  ? fluconazole (DIFLUCAN) 150 MG tablet  ?  ? Other  ? History of colon cancer  ?  Advised she is due for colonoscopy. She declines referral at this time and states she will discuss with PCP, oncology , Dr Tasia Catchings ? ?  ?  ? ?Other Visit Diagnoses   ? ? Yeast vaginitis      ? Relevant Medications  ? fluconazole (DIFLUCAN) 150 MG tablet  ? Other Relevant Orders  ? Cervicovaginal ancillary only( [place])  ? ?  ? ? ? ? ?I am having [name]. Dargis "[name]" start on fluconazole.  I am also having her maintain her multivitamin with minerals, cetirizine, calcium-vitamin D, Probiotic Product (PROBIOTIC PO), sodium chloride, ipratropium, omeprazole, estradiol, levothyroxine, ondansetron, mel

## 2021-09-02 LAB — CERVICOVAGINAL ANCILLARY ONLY
Bacterial Vaginitis (gardnerella): NEGATIVE
Candida Glabrata: POSITIVE — AB
Candida Vaginitis: NEGATIVE
Chlamydia: NEGATIVE
Comment: NEGATIVE
Comment: NEGATIVE
Comment: NEGATIVE
Comment: NEGATIVE
Comment: NEGATIVE
Comment: NORMAL
Neisseria Gonorrhea: NEGATIVE
Trichomonas: NEGATIVE

## 2021-09-07 DIAGNOSIS — Z20822 Contact with and (suspected) exposure to covid-19: Secondary | ICD-10-CM | POA: Diagnosis not present

## 2021-09-08 DIAGNOSIS — Z20822 Contact with and (suspected) exposure to covid-19: Secondary | ICD-10-CM | POA: Diagnosis not present

## 2021-09-17 ENCOUNTER — Other Ambulatory Visit: Payer: Self-pay | Admitting: Oncology

## 2021-09-18 ENCOUNTER — Telehealth: Payer: Self-pay

## 2021-09-18 NOTE — Telephone Encounter (Signed)
Pt informed of results and verbalized understanding.  

## 2021-09-18 NOTE — Telephone Encounter (Signed)
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 recommend observation. Keep current appt.

## 2021-09-22 ENCOUNTER — Other Ambulatory Visit: Payer: Medicare Other

## 2021-10-08 ENCOUNTER — Telehealth (HOSPITAL_COMMUNITY): Payer: Self-pay | Admitting: *Deleted

## 2021-10-08 DIAGNOSIS — F900 Attention-deficit hyperactivity disorder, predominantly inattentive type: Secondary | ICD-10-CM

## 2021-10-08 MED ORDER — METHYLPHENIDATE HCL 5 MG PO TABS: 5.0000 mg | ORAL_TABLET | Freq: Every day | ORAL | 0 refills | Status: DC

## 2021-10-08 NOTE — Telephone Encounter (Signed)
Pt called requesting refill of Ritalin 5 mg. Last script sent on 4/25. Pt has an appointment on 11/25/21. Please review.

## 2021-10-08 NOTE — Telephone Encounter (Signed)
Done

## 2021-11-05 ENCOUNTER — Telehealth (HOSPITAL_COMMUNITY): Payer: Self-pay | Admitting: *Deleted

## 2021-11-05 DIAGNOSIS — F900 Attention-deficit hyperactivity disorder, predominantly inattentive type: Secondary | ICD-10-CM

## 2021-11-05 MED ORDER — METHYLPHENIDATE HCL 5 MG PO TABS: 5.0000 mg | ORAL_TABLET | Freq: Every day | ORAL | 0 refills | Status: DC

## 2021-11-05 NOTE — Telephone Encounter (Signed)
Pt called requesting a refill of Ritalin 5 mg last filled on 10/08/21. Pt still using [place] in profile. Please review.

## 2021-11-05 NOTE — Telephone Encounter (Signed)
Done

## 2021-11-12 ENCOUNTER — Ambulatory Visit
Admission: RE | Admit: 2021-11-12 | Discharge: 2021-11-12 | Disposition: A | Discharge status: Home / Self Care | Payer: Medicare Other | Source: Ambulatory Visit | Attending: Internal Medicine | Admitting: Internal Medicine

## 2021-11-12 DIAGNOSIS — Z1231 Encounter for screening mammogram for malignant neoplasm of breast: Secondary | ICD-10-CM | POA: Diagnosis not present

## 2021-11-12 DIAGNOSIS — M85852 Other specified disorders of bone density and structure, left thigh: Secondary | ICD-10-CM | POA: Diagnosis not present

## 2021-11-12 DIAGNOSIS — M85832 Other specified disorders of bone density and structure, left forearm: Secondary | ICD-10-CM | POA: Diagnosis not present

## 2021-11-12 DIAGNOSIS — E2839 Other primary ovarian failure: Secondary | ICD-10-CM | POA: Diagnosis not present

## 2021-11-12 DIAGNOSIS — M8580 Other specified disorders of bone density and structure, unspecified site: Secondary | ICD-10-CM | POA: Diagnosis not present

## 2021-11-21 DIAGNOSIS — H26491 Other secondary cataract, right eye: Secondary | ICD-10-CM | POA: Diagnosis not present

## 2021-11-21 DIAGNOSIS — H04123 Dry eye syndrome of bilateral lacrimal glands: Secondary | ICD-10-CM | POA: Diagnosis not present

## 2021-11-21 DIAGNOSIS — Z961 Presence of intraocular lens: Secondary | ICD-10-CM | POA: Diagnosis not present

## 2021-11-23 ENCOUNTER — Other Ambulatory Visit (HOSPITAL_COMMUNITY): Payer: Self-pay | Admitting: Psychiatry

## 2021-11-23 DIAGNOSIS — F331 Major depressive disorder, recurrent, moderate: Secondary | ICD-10-CM

## 2021-11-25 ENCOUNTER — Encounter (HOSPITAL_COMMUNITY): Payer: Self-pay | Admitting: Psychiatry

## 2021-11-25 ENCOUNTER — Telehealth (HOSPITAL_BASED_OUTPATIENT_CLINIC_OR_DEPARTMENT_OTHER): Payer: Medicare Other | Admitting: Psychiatry

## 2021-11-25 DIAGNOSIS — F900 Attention-deficit hyperactivity disorder, predominantly inattentive type: Secondary | ICD-10-CM | POA: Diagnosis not present

## 2021-11-25 DIAGNOSIS — F331 Major depressive disorder, recurrent, moderate: Secondary | ICD-10-CM | POA: Diagnosis not present

## 2021-11-25 MED ORDER — TRAZODONE HCL 50 MG PO TABS: 50.0000 mg | ORAL_TABLET | Freq: Every evening | ORAL | 0 refills | Status: DC | PRN

## 2021-11-25 MED ORDER — METHYLPHENIDATE HCL 5 MG PO TABS: 5.0000 mg | ORAL_TABLET | Freq: Every day | ORAL | 0 refills | Status: DC

## 2021-11-25 MED ORDER — LAMOTRIGINE 200 MG PO TABS: 200.0000 mg | ORAL_TABLET | ORAL | 0 refills | Status: DC

## 2021-11-25 MED ORDER — TRINTELLIX 20 MG PO TABS: 20.0000 mg | ORAL_TABLET | ORAL | 2 refills | Status: DC

## 2021-11-25 NOTE — Progress Notes (Signed)
Virtual Visit via Telephone Note  I connected with Jasmia Angst Quale on 11/25/21 at  2:00 PM EDT by telephone and verified that I am speaking with the correct person using two identifiers.  Location: Patient: At [facility] Provider: Home Office   I discussed the limitations, risks, security and privacy concerns of performing an evaluation and management service by telephone and the availability of in person appointments. I also discussed with the patient that there may be a patient responsible charge related to this service. The patient expressed understanding and agreed to proceed.   History of Present Illness: Patient is evaluated by phone session.  Patient told on May 10 when they were ready to go to [facility] she received the news that her 75 year old younger brother died due to complication of bone marrow transplant.  Patient told the news was devastated and she went to a funeral and spent some time with her sister-in-law.  She is trying to keep herself busy in church activities.  She had a good support from her family and friends.  She started taking some vitamins that is helping her energy level.  She admitted last month was difficult because having some ruminative thoughts but things are a little better.  She is sleeping good with the CPAP.  She denies any hopelessness, worthlessness or panic attack.  Her attention and concentration is good.  She does not want to change the medication.  She has no tremors, shakes or any EPS.  She does go to church at least once a day in a week and enjoys working there.  Her plan is to visit again her sister-in-law in the future.  She is relieved that her husband is doing well physically.  Patient reported her appetite is okay.  Her weight is stable.  She wants to keep the current medication.  She has no rash, itching or shakes.  Past Psychiatric History:  H/O mania, irritability, impulsive behavior and overdose at age 5. H/O inpatient twice. Had  psychological testing and diagnosed ADD. Took Concerta, (Adderall, Vyvanse made anxious) Wellbutrin, Prozac, lithium, Effexor, Lexapro, Abilify and Cymbalta.  Best when given female hormone to help endometriosis.  Had ECT, did not work. TMS helped. No history of psychosis.   Psychiatric Specialty Exam: Physical Exam  Review of Systems  Weight 147 lb (66.7 kg).There is no height or weight on file to calculate BMI.  General Appearance: NA  Eye Contact:  NA  Speech:  Slow  Volume:  Decreased  Mood:  Dysphoric  Affect:  NA  Thought Process:  Goal Directed  Orientation:  Full (Time, Place, and Person)  Thought Content:  Rumination  Suicidal Thoughts:  No  Homicidal Thoughts:  No  Memory:  Immediate;   Good Recent;   Good Remote;   Good  Judgement:  Intact  Insight:  Present  Psychomotor Activity:  NA  Concentration:  Concentration: Good and Attention Span: Good  Recall:  Good  Fund of Knowledge:  Good  Language:  Good  Akathisia:  No  Handed:  Right  AIMS (if indicated):     Assets:  Communication Skills Desire for Improvement Housing Social Support Transportation  ADL's:  Intact  Cognition:  WNL  Sleep:   ok with CPAP      Assessment and Plan: Major depressive disorder, recurrent.  Attention deficit disorder, inattentive type.  Discussed recent loss of her 74 year old younger brother due to complication of bone marrow transplant.  I offered grief counseling but patient has a  good support of family and friends.  She feels things are okay now however last month was difficult.  She does not want to change the medication.  Since added vitamins her energy level is improved.  I have continue Ritalin 5 mg in the morning, Trintellix 20 mg in the morning, trazodone 50 mg at bedtime and lamotrigine 200 mg daily.  Recommended to call us back if she has any question or any concern.  Follow-up in 3 months.  Follow Up Instructions:    I discussed the assessment and treatment plan with  the patient. The patient was provided an opportunity to ask questions and all were answered. The patient agreed with the plan and demonstrated an understanding of the instructions.   The patient was advised to call back or seek an in-person evaluation if the symptoms worsen or if the condition fails to improve as anticipated.  Collaboration of Care: Primary Care Provider AEB notes are available in epic to review  Patient/Guardian was advised Release of Information must be obtained prior to any record release in order to collaborate their care with an outside provider. Patient/Guardian was advised if they have not already done so to contact the registration department to sign all necessary forms in order for [name] to release information regarding their care.   Consent: Patient/Guardian gives verbal consent for treatment and assignment of benefits for services provided during this visit. Patient/Guardian expressed understanding and agreed to proceed.    I provided 21 minutes of non-face-to-face time during this encounter.   Kathlee Nations, MD

## 2022-01-06 ENCOUNTER — Telehealth (HOSPITAL_COMMUNITY): Payer: Self-pay | Admitting: *Deleted

## 2022-01-06 DIAGNOSIS — F900 Attention-deficit hyperactivity disorder, predominantly inattentive type: Secondary | ICD-10-CM

## 2022-01-06 NOTE — Telephone Encounter (Signed)
Pt called requesting a refill of Ritalin 5 mg, last filled 12/08/21. Pt has an appointment scheduled for 02/24/22. Please review.

## 2022-01-07 MED ORDER — METHYLPHENIDATE HCL 5 MG PO TABS: 5.0000 mg | ORAL_TABLET | Freq: Every day | ORAL | 0 refills | Status: DC

## 2022-01-07 NOTE — Telephone Encounter (Signed)
Done

## 2022-01-19 DIAGNOSIS — Z23 Encounter for immunization: Secondary | ICD-10-CM | POA: Diagnosis not present

## 2022-01-23 ENCOUNTER — Encounter: Payer: Self-pay | Admitting: Internal Medicine

## 2022-01-23 ENCOUNTER — Ambulatory Visit (INDEPENDENT_AMBULATORY_CARE_PROVIDER_SITE_OTHER): Payer: Medicare Other | Admitting: Internal Medicine

## 2022-01-23 VITALS — BP 118/76 | HR 73 | Temp 98.2°F | Ht 62.0 in | Wt 146.8 lb

## 2022-01-23 DIAGNOSIS — N959 Unspecified menopausal and perimenopausal disorder: Secondary | ICD-10-CM

## 2022-01-23 DIAGNOSIS — R112 Nausea with vomiting, unspecified: Secondary | ICD-10-CM

## 2022-01-23 DIAGNOSIS — R5383 Other fatigue: Secondary | ICD-10-CM | POA: Diagnosis not present

## 2022-01-23 DIAGNOSIS — E611 Iron deficiency: Secondary | ICD-10-CM

## 2022-01-23 DIAGNOSIS — E559 Vitamin D deficiency, unspecified: Secondary | ICD-10-CM

## 2022-01-23 DIAGNOSIS — Z1231 Encounter for screening mammogram for malignant neoplasm of breast: Secondary | ICD-10-CM | POA: Diagnosis not present

## 2022-01-23 DIAGNOSIS — E039 Hypothyroidism, unspecified: Secondary | ICD-10-CM | POA: Diagnosis not present

## 2022-01-23 LAB — COMPREHENSIVE METABOLIC PANEL
ALT: 15 U/L (ref 0–35)
AST: 14 U/L (ref 0–37)
Albumin: 3.9 g/dL (ref 3.5–5.2)
Alkaline Phosphatase: 55 U/L (ref 39–117)
BUN: 11 mg/dL (ref 6–23)
CO2: 31 mEq/L (ref 19–32)
Calcium: 9.7 mg/dL (ref 8.4–10.5)
Chloride: 102 mEq/L (ref 96–112)
Creatinine, Ser: 1.07 mg/dL (ref 0.40–1.20)
GFR: 51.12 mL/min — ABNORMAL LOW (ref >60.00)
Glucose, Bld: 78 mg/dL (ref 70–99)
Potassium: 4.2 mEq/L (ref 3.5–5.1)
Sodium: 141 mEq/L (ref 135–145)
Total Bilirubin: 0.4 mg/dL (ref 0.2–1.2)
Total Protein: 6.3 g/dL (ref 6.0–8.3)

## 2022-01-23 LAB — CBC WITH DIFFERENTIAL/PLATELET
Basophils Absolute: 0 10*3/uL (ref 0.0–0.1)
Basophils Relative: 0.4 % (ref 0.0–3.0)
Eosinophils Absolute: 0 10*3/uL (ref 0.0–0.7)
Eosinophils Relative: 0.4 % (ref 0.0–5.0)
HCT: 38.7 % (ref 36.0–46.0)
Hemoglobin: 12.9 g/dL (ref 12.0–15.0)
Lymphocytes Relative: 37.4 % (ref 12.0–46.0)
Lymphs Abs: 3.3 10*3/uL (ref 0.7–4.0)
MCHC: 33.4 g/dL (ref 30.0–36.0)
MCV: 92.9 fl (ref 78.0–100.0)
Monocytes Absolute: 0.4 10*3/uL (ref 0.1–1.0)
Monocytes Relative: 4.3 % (ref 3.0–12.0)
Neutro Abs: 5.1 10*3/uL (ref 1.4–7.7)
Neutrophils Relative %: 57.5 % (ref 43.0–77.0)
Platelets: 272 10*3/uL (ref 150.0–400.0)
RBC: 4.17 Mil/uL (ref 3.87–5.11)
RDW: 13.4 % (ref 11.5–15.5)
WBC: 8.9 10*3/uL (ref 4.0–10.5)

## 2022-01-23 LAB — TSH: TSH: 2.5 u[IU]/mL (ref 0.35–5.50)

## 2022-01-23 LAB — IBC + FERRITIN
Ferritin: 27 ng/mL (ref 10.0–291.0)
Iron: 84 ug/dL (ref 42–145)
Saturation Ratios: 25.8 % (ref 20.0–50.0)
TIBC: 326.2 ug/dL (ref 250.0–450.0)
Transferrin: 233 mg/dL (ref 212.0–360.0)

## 2022-01-23 LAB — VITAMIN D 25 HYDROXY (VIT D DEFICIENCY, FRACTURES): VITD: 44.7 ng/mL (ref 30.00–100.00)

## 2022-01-23 MED ORDER — ESTRADIOL 0.5 MG PO TABS: 0.5000 mg | ORAL_TABLET | Freq: Every day | ORAL | 3 refills | Status: DC

## 2022-01-23 MED ORDER — LEVOTHYROXINE SODIUM 25 MCG PO TABS: 25.0000 ug | ORAL_TABLET | Freq: Every day | ORAL | 3 refills | Status: DC

## 2022-01-23 MED ORDER — ONDANSETRON 4 MG PO TBDP: 4.0000 mg | ORAL_TABLET | Freq: Two times a day (BID) | ORAL | 3 refills | Status: DC | PRN

## 2022-01-23 NOTE — Progress Notes (Signed)
Chief Complaint  Patient presents with   Follow-up    7 month f/u   F/u  1. Menopause wants refill estrace 0.5 she tried to stop taking 1 year ago and had hot flashes  2. Colon cancer rec colonoscopy overdue pt wants to hold and disc with h/o upcoming appt though I rec and they will do CEA    Review of Systems  Constitutional:  Negative for weight loss.  HENT:  Negative for hearing loss.   Eyes:  Negative for blurred vision.  Respiratory:  Negative for shortness of breath.   Cardiovascular:  Negative for chest pain.  Gastrointestinal:  Negative for abdominal pain and blood in stool.  Genitourinary:  Negative for dysuria.  Musculoskeletal:  Negative for falls and joint pain.  Skin:  Negative for rash.  Neurological:  Negative for headaches.  Psychiatric/Behavioral:  Negative for depression.    Past Medical History:  Diagnosis Date   Allergy    Anemia    Carcinoma ([place])    Colon cancer ([place]) 01/2017   Colon polyps    Diverticulitis    01/2018 abscess hosp. 01/2018   Endometriosis    Fibromyalgia    Gastric polyps    GERD (gastroesophageal reflux disease)    Hemorrhoids    Hypothyroidism    Iron deficiency anemia 03/08/2017   Major depressive disorder, recurrent episode, mild (HCC)    Dr. Adele Schilder in [place] h/o ECT x 6 x and hosp x 2 and transcranial magnetic treatment    Miscarriage    x2   PONV (postoperative nausea and vomiting)    Skin cancer    arm, thigh and chest Dr. Evorn Gong    Sleep apnea    USES CPAP   UTI (urinary tract infection)    Past Surgical History:  Procedure Laterality Date   ABDOMINAL HYSTERECTOMY  1990   endometriosis   BREAST BIOPSY Left 2004   benign   CATARACT EXTRACTION W/PHACO Right 10/26/2018   Procedure: CATARACT EXTRACTION PHACO AND INTRAOCULAR LENS PLACEMENT ([place]) RIGHT TORIC LENS;  Surgeon: Leandrew Koyanagi, MD;  Location: [facility];  Service: Ophthalmology;  Laterality: Right;  sleep apnea   CATARACT EXTRACTION, BILATERAL  Bilateral 05/2012   CHOLECYSTECTOMY  2001   COLONOSCOPY WITH PROPOFOL N/A 12/28/2016   Procedure: COLONOSCOPY WITH PROPOFOL;  Surgeon: Manya Silvas, MD;  Location: [facility] ENDOSCOPY;  Service: Endoscopy;  Laterality: N/A;   COLONOSCOPY WITH PROPOFOL N/A 01/17/2018   Procedure: COLONOSCOPY WITH PROPOFOL;  Surgeon: Manya Silvas, MD;  Location: [facility] ENDOSCOPY;  Service: Endoscopy;  Laterality: N/A;   ESOPHAGOGASTRODUODENOSCOPY (EGD) WITH PROPOFOL N/A 12/28/2016   Procedure: ESOPHAGOGASTRODUODENOSCOPY (EGD) WITH PROPOFOL;  Surgeon: Manya Silvas, MD;  Location[facility] ENDOSCOPY;  Service: Endoscopy;  Laterality: N/A;   EYE SURGERY     lens left eye, cataract right eye    LAPAROSCOPIC RIGHT COLECTOMY Right 01/21/2017   Procedure: LAPAROSCOPIC ASSISTED RIGHT COLECTOMY;  Surgeon: Robert Bellow, MD;  Location: [facility] ORS;  Service: General;  Laterality: Right;   TONSILLECTOMY     Family History  Problem Relation Age of Onset   Depression Brother    Alcohol abuse Brother    Suicidality Brother    Stroke Mother    Hyperlipidemia Mother    Hypertension Mother    Clotting disorder Brother    Cancer Brother        MDS   Colon polyps Father    Melanoma Sister    Cancer Sister  skin MM   Stroke Maternal Grandfather    Melanoma Nephew    Bipolar disorder Neg Hx    Social History   Socioeconomic History   Marital status: Married    Spouse name: Not on file   Number of children: Not on file   Years of education: Not on file   Highest education level: Not on file  Occupational History   Not on file  Tobacco Use   Smoking status: Never   Smokeless tobacco: Never  Vaping Use   Vaping Use: Never used  Substance and Sexual Activity   Alcohol use: Not Currently    Alcohol/week: 0.0 standard drinks of alcohol    Comment:     Drug use: No   Sexual activity: Yes    Partners: Male    Birth control/protection: None  Other Topics Concern   Not on file  Social History  Narrative   Light smoker age 52 stopped    2 sons    Married    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, housewife    Owns guns, wears seat belt, safe in relationship    Social Determinants of Health   Financial Resource Strain: Low Risk  (04/09/2021)   Overall Financial Resource Strain (CARDIA)    Difficulty of Paying Living Expenses: Not hard at all  Food Insecurity: No Food Insecurity (04/09/2021)   Hunger Vital Sign    Worried About Running Out of Food in the Last Year: Never true    [place] in the Last Year: Never true  Transportation Needs: No Transportation Needs (04/09/2021)   PRAPARE - Hydrologist (Medical): No    Lack of Transportation (Non-Medical): No  Physical Activity: Not on file  Stress: No Stress Concern Present (04/09/2021)   [place]    Feeling of Stress : Not at all  Social Connections: Unknown (04/09/2021)   Social Connection and Isolation Panel [NHANES]    Frequency of Communication with Friends and Family: Not on file    Frequency of Social Gatherings with Friends and Family: Not on file    Attends Religious Services: Not on file    Active Member of Clubs or Organizations: Not on file    Attends Archivist Meetings: Not on file    Marital Status: Married  Intimate Partner Violence: Not At Risk (04/09/2021)   Humiliation, Afraid, Rape, and Kick questionnaire    Fear of Current or Ex-Partner: No    Emotionally Abused: No    Physically Abused: No    Sexually Abused: No   Current Meds  Medication Sig   calcium-vitamin D (OSCAL WITH D) 500-200 MG-UNIT tablet Take 2 tablets by mouth 2 (two) times daily.   cetirizine (ZYRTEC) 10 MG tablet Take 10 mg by mouth daily as needed for allergies.    lamoTRIgine (LAMICTAL) 200 MG tablet Take 1 tablet (200 mg total) by mouth every morning.   methylphenidate (RITALIN) 5 MG tablet Take 1 tablet (5 mg total) by mouth daily in the  afternoon.   Multiple Vitamins-Minerals (MULTIVITAMIN WITH MINERALS) tablet Take 1 tablet by mouth daily.   omeprazole (PRILOSEC) 20 MG capsule TAKE ONE CAPSULE BY MOUTH DAILY 30 MINUTES PRIOR TO LUNCH. MUST WAIT 3-4 HOURS AFTER THYROID PILL   Probiotic Product (PROBIOTIC PO) Take by mouth.   traZODone (DESYREL) 50 MG tablet Take 1 tablet (50 mg total) by mouth at bedtime as needed for sleep.  TRINTELLIX 20 MG TABS tablet Take 1 tablet (20 mg total) by mouth every morning.   [DISCONTINUED] estradiol (ESTRACE) 0.5 MG tablet Take 1 tablet (0.5 mg total) by mouth daily.   [DISCONTINUED] levothyroxine (SYNTHROID) 25 MCG tablet Take 1 tablet (25 mcg total) by mouth daily before breakfast. 30 minutes   [DISCONTINUED] ondansetron (ZOFRAN-ODT) 4 MG disintegrating tablet Take 1 tablet (4 mg total) by mouth 2 (two) times daily as needed for nausea or vomiting. AND PRN if this continues see gastroenterology   Allergies  Allergen Reactions   Other     BAND-AIDS IF LEFT ON FOR EXTENDED PERIODS-REDNESS   Codeine     Nausea   No results found for this or any previous visit (from the past 2160 hour(s)). Objective  Body mass index is 26.85 kg/m. Wt Readings from Last 3 Encounters:  01/23/22 146 lb 12.8 oz (66.6 kg)  09/01/21 149 lb 6.4 oz (67.8 kg)  07/08/21 157 lb (71.2 kg)   Temp Readings from Last 3 Encounters:  01/23/22 98.2 F (36.8 C) (Oral)  09/01/21 98.2 F (36.8 C) (Oral)  07/08/21 (!) 96.1 F (35.6 C) (Tympanic)   BP Readings from Last 3 Encounters:  01/23/22 118/76  09/01/21 130/70  07/08/21 132/70   Pulse Readings from Last 3 Encounters:  01/23/22 73  09/01/21 70  07/08/21 69    Physical Exam Vitals and nursing note reviewed.  Constitutional:      Appearance: Normal appearance. She is well-developed and well-groomed.  HENT:     Head: Normocephalic and atraumatic.  Eyes:     Conjunctiva/sclera: Conjunctivae normal.     Pupils: Pupils are equal, round, and reactive to  light.  Cardiovascular:     Rate and Rhythm: Normal rate and regular rhythm.     Heart sounds: Normal heart sounds. No murmur heard. Pulmonary:     Effort: Pulmonary effort is normal.     Breath sounds: Normal breath sounds.  Abdominal:     General: Abdomen is flat. Bowel sounds are normal.     Tenderness: There is no abdominal tenderness.  Musculoskeletal:        General: No tenderness.  Skin:    General: Skin is warm and dry.  Neurological:     General: No focal deficit present.     Mental Status: She is alert and oriented to person, place, and time. Mental status is at baseline.     Cranial Nerves: Cranial nerves 2-12 are intact.     Motor: Motor function is intact.     Coordination: Coordination is intact.     Gait: Gait is intact.  Psychiatric:        Attention and Perception: Attention and perception normal.        Mood and Affect: Mood and affect normal.        Speech: Speech normal.        Behavior: Behavior normal. Behavior is cooperative.        Thought Content: Thought content normal.        Cognition and Memory: Cognition and memory normal.        Judgment: Judgment normal.     Assessment  Plan  Hypothyroidism, unspecified type - Plan: levothyroxine (SYNTHROID) 25 MCG tablet qam TSH   fatigue Iron deficiency - Plan: IBC + Ferritin Comprehensive metabolic panel, CBC w/Diff, TSH, IBC + Ferritin Check vitamin d  Unspecified menopausal and perimenopausal disorder - Plan: estradiol (ESTRACE) 0.5 MG tablet  Nausea and vomiting in adult -  Plan: ondansetron (ZOFRAN-ODT) 4 MG disintegrating tablet,   HM Flu shot utd [facility] 2023 [facility] Tdap 02/15/14 and 08/10/18 utd prevnar pna 23 05/31/18 03/2013 zostervax  2/2 shingrix   hep C negative  covid 4/4 utd consider 5th dose    mammo 07/15/20 negative ordered    S/p hysterectomy out of age window pap  -pap 10/15/14 negative neg HPV    Colonoscopy 01/17/18 diverticulosis, [facility] Dr. Tiffany Kocher repeat in 2 years h/o colon  cancer -per pt needs smallest scope and not sure if wants f/u 01/18/20 consider CT entero in the future  07/17/19 GI appt [facility] Had 08/06/20 get records colonoscopy Dr. Alice Reichert    DEXA 12/21/17 osteopenia consider repeat in 3-5 years    Light smoker age 54 y.o    Dermatology Dr. Inez Catalina office Q6 months h/o nmsc and mohs 06/2019 and 07/2019 Dr. Natale Lay  Derm due yearly in 2022 with Dr. Kellie Moor end of 07/2021    Former PCP Dr. Clayborn Bigness requested records today including carotid US from 01/2018 in system w/o result. Pt to call/send letter to get results faxed to [name] Specialist Dr.Yu [facility] Dr. Kellie Moor derm Psych     Provider: Dr. Olivia Mackie McLean-Scocuzza-Internal Medicine

## 2022-01-23 NOTE — Patient Instructions (Addendum)
08/06/2020 Office Visit [facility]   [place], [place] [address]   [place], [place], [address] [place]   [place], [place] [address]   [phone] (Work)   [phone] ([address])     Chauncey Mann [place], [place] [address]   [place], [place] [address]   [phone] (Work)   [phone] (Fax)   History of colon cancer, stage II (Primary Dx);  Chronic nausea;  Fundic gland polyps of stomach, benign;  Gastroesophageal reflux disease, unspecified whether esophagitis present;  Loose stools  Think about colonoscopy

## 2022-02-06 ENCOUNTER — Telehealth (HOSPITAL_COMMUNITY): Payer: Self-pay | Admitting: *Deleted

## 2022-02-06 DIAGNOSIS — F900 Attention-deficit hyperactivity disorder, predominantly inattentive type: Secondary | ICD-10-CM

## 2022-02-06 MED ORDER — METHYLPHENIDATE HCL 5 MG PO TABS: 5.0000 mg | ORAL_TABLET | Freq: Every day | ORAL | 0 refills | Status: DC

## 2022-02-06 NOTE — Telephone Encounter (Signed)
Done

## 2022-02-06 NOTE — Telephone Encounter (Signed)
Pt called requesting a refill of Ritalin 5 mg. Last to fill date of 01/08/22 #30. Pt has an upcoming appointment on 02/24/22. Please review.

## 2022-02-21 ENCOUNTER — Other Ambulatory Visit (HOSPITAL_COMMUNITY): Payer: Self-pay | Admitting: Psychiatry

## 2022-02-21 DIAGNOSIS — F331 Major depressive disorder, recurrent, moderate: Secondary | ICD-10-CM

## 2022-02-24 ENCOUNTER — Telehealth (HOSPITAL_COMMUNITY): Payer: Medicare Other | Admitting: Psychiatry

## 2022-02-25 ENCOUNTER — Encounter (HOSPITAL_COMMUNITY): Payer: Self-pay | Admitting: Psychiatry

## 2022-02-25 ENCOUNTER — Telehealth (HOSPITAL_BASED_OUTPATIENT_CLINIC_OR_DEPARTMENT_OTHER): Payer: Medicare Other | Admitting: Psychiatry

## 2022-02-25 DIAGNOSIS — F331 Major depressive disorder, recurrent, moderate: Secondary | ICD-10-CM

## 2022-02-25 DIAGNOSIS — F900 Attention-deficit hyperactivity disorder, predominantly inattentive type: Secondary | ICD-10-CM | POA: Diagnosis not present

## 2022-02-25 MED ORDER — LAMOTRIGINE 200 MG PO TABS: 200.0000 mg | ORAL_TABLET | ORAL | 0 refills | Status: DC

## 2022-02-25 MED ORDER — TRINTELLIX 20 MG PO TABS: 20.0000 mg | ORAL_TABLET | ORAL | 2 refills | Status: DC

## 2022-02-25 MED ORDER — TRAZODONE HCL 50 MG PO TABS: 50.0000 mg | ORAL_TABLET | Freq: Every evening | ORAL | 0 refills | Status: DC | PRN

## 2022-02-25 MED ORDER — METHYLPHENIDATE HCL 5 MG PO TABS: 5.0000 mg | ORAL_TABLET | Freq: Every day | ORAL | 0 refills | Status: DC

## 2022-02-25 NOTE — Progress Notes (Signed)
Virtual Visit via Telephone Note  I connected with [name] on 02/25/22 at  8:40 AM EDT by telephone and verified that I am speaking with the correct person using two identifiers.  Location: Patient: Home Provider: Home Office   I discussed the limitations, risks, security and privacy concerns of performing an evaluation and management service by telephone and the availability of in person appointments. I also discussed with the patient that there may be a patient responsible charge related to this service. The patient expressed understanding and agreed to proceed.   History of Present Illness: Patient is evaluated by phone session.  She is doing well on her current medication.  She is expecting a lot of family member around the holidays.  Her attention concentration is good.  Her depression is a stable and she denies any crying spells or any feeling of hopelessness or worthlessness.  She did go to [place] to see her sister-in-law after her brother died due to complication of bone marrow transplant.  Patient does not want to change the medication.  She sleeps good with the help of CPAP and trazodone.  Her energy level is good.  Her appetite is okay.  Her weight is stable.  She denies any anhedonia or any crying spells.  Like to keep the current medication.   Past Psychiatric History:  H/O mania, irritability, impulsive behavior and overdose at age 89. H/O inpatient twice. Had psychological testing and diagnosed ADD. Took Concerta, (Adderall, Vyvanse made anxious) Wellbutrin, Prozac, lithium, Effexor, Lexapro, Abilify and Cymbalta.  Best when given female hormone to help endometriosis.  Had ECT, did not work. TMS helped. No history of psychosis.   Psychiatric Specialty Exam: Physical Exam  Review of Systems  Weight 147 lb (66.7 kg).There is no height or weight on file to calculate BMI.  General Appearance: NA  Eye Contact:  NA  Speech:  Clear and Coherent and Normal Rate  Volume:   Normal  Mood:  Euthymic  Affect:  NA  Thought Process:  Goal Directed  Orientation:  Full (Time, Place, and Person)  Thought Content:  Logical  Suicidal Thoughts:  No  Homicidal Thoughts:  No  Memory:  Immediate;   Good Recent;   Good Remote;   Good  Judgement:  Intact  Insight:  Present  Psychomotor Activity:  NA  Concentration:  Concentration: Good and Attention Span: Good  Recall:  Good  Fund of Knowledge:  Good  Language:  Good  Akathisia:  No  Handed:  Right  AIMS (if indicated):     Assets:  Communication Skills Desire for Improvement Housing Resilience Social Support Transportation  ADL's:  Intact  Cognition:  WNL  Sleep:   ok with CPAP      Assessment and Plan: Major depressive disorder, recurrent.  Attention deficit disorder, inattentive type.  Patient is stable on her current medication.  Continue Trintellix 20 mg in the morning, trazodone 50 mg at bedtime, Lamictal 200 mg daily and Ritalin 5 mg in the morning.  She takes Ritalin every day.  She has no rash, itching tremors or shakes.  Recommend to call us back if she has any question or any concern.  Follow-up in 3 months.  Follow Up Instructions:    I discussed the assessment and treatment plan with the patient. The patient was provided an opportunity to ask questions and all were answered. The patient agreed with the plan and demonstrated an understanding of the instructions.   The patient was advised to  call back or seek an in-person evaluation if the symptoms worsen or if the condition fails to improve as anticipated.  Collaboration of Care: Other provider involved in patient's care AEB notes are available in epic to review.  Patient/Guardian was advised Release of Information must be obtained prior to any record release in order to collaborate their care with an outside provider. Patient/Guardian was advised if they have not already done so to contact the registration department to sign all necessary  forms in order for [name] to release information regarding their care.   Consent: Patient/Guardian gives verbal consent for treatment and assignment of benefits for services provided during this visit. Patient/Guardian expressed understanding and agreed to proceed.    I provided 21 minutes of non-face-to-face time during this encounter.   Kathlee Nations, MD

## 2022-03-06 ENCOUNTER — Other Ambulatory Visit (HOSPITAL_COMMUNITY): Payer: Self-pay | Admitting: Psychiatry

## 2022-03-06 DIAGNOSIS — F331 Major depressive disorder, recurrent, moderate: Secondary | ICD-10-CM

## 2022-04-08 ENCOUNTER — Telehealth (HOSPITAL_COMMUNITY): Payer: Self-pay | Admitting: *Deleted

## 2022-04-08 DIAGNOSIS — F900 Attention-deficit hyperactivity disorder, predominantly inattentive type: Secondary | ICD-10-CM

## 2022-04-08 MED ORDER — METHYLPHENIDATE HCL 5 MG PO TABS: 5.0000 mg | ORAL_TABLET | Freq: Every day | ORAL | 0 refills | Status: DC

## 2022-04-08 NOTE — Telephone Encounter (Signed)
Pt called requesting a refill of the Ritalin 5 mg. Last script sent on 02/25/22. Pt has an upcoming appointment scheduled for 05/20/22. Please review.

## 2022-04-08 NOTE — Telephone Encounter (Signed)
Done

## 2022-05-06 ENCOUNTER — Telehealth: Payer: Medicare Other | Admitting: Nurse Practitioner

## 2022-05-06 DIAGNOSIS — J0140 Acute pansinusitis, unspecified: Secondary | ICD-10-CM | POA: Diagnosis not present

## 2022-05-06 DIAGNOSIS — R051 Acute cough: Secondary | ICD-10-CM | POA: Diagnosis not present

## 2022-05-06 MED ORDER — BENZONATATE 100 MG PO CAPS: 100.0000 mg | ORAL_CAPSULE | Freq: Three times a day (TID) | ORAL | 0 refills | Status: DC | PRN

## 2022-05-06 MED ORDER — AMOXICILLIN-POT CLAVULANATE 875-125 MG PO TABS: 1.0000 | ORAL_TABLET | Freq: Two times a day (BID) | ORAL | 0 refills | Status: AC

## 2022-05-06 NOTE — Progress Notes (Signed)
E-Visit for Sinus Problems  We are sorry that you are not feeling well.  Here is how we plan to help!  Based on what you have shared with me it looks like you have sinusitis.  Sinusitis is inflammation and infection in the sinus cavities of the head.  Based on your presentation I believe you most likely have Acute Bacterial Sinusitis.  This is an infection caused by bacteria and is treated with antibiotics. I have prescribed Augmentin '875mg'$ /'125mg'$  one tablet twice daily with food, for 7 days. You may use an oral decongestant such as Mucinex D or if you have glaucoma or high blood pressure use plain Mucinex. Saline nasal spray help and can safely be used as often as needed for congestion.  If you develop worsening sinus pain, fever or notice severe headache and vision changes, or if symptoms are not better after completion of antibiotic, please schedule an appointment with a health care provider.    If Mucinex is not helpful, we will also prescribe a cough medication.   Meds ordered this encounter  Medications   amoxicillin-clavulanate (AUGMENTIN) 875-125 MG tablet    Sig: Take 1 tablet by mouth 2 (two) times daily for 7 days. Take with food    Dispense:  14 tablet    Refill:  0   benzonatate (TESSALON) 100 MG capsule    Sig: Take 1 capsule (100 mg total) by mouth 3 (three) times daily as needed.    Dispense:  30 capsule    Refill:  0     Sinus infections are not as easily transmitted as other respiratory infection, however we still recommend that you avoid close contact with loved ones, especially the very young and elderly.  Remember to wash your hands thoroughly throughout the day as this is the number one way to prevent the spread of infection!  Home Care: Only take medications as instructed by your medical team. Complete the entire course of an antibiotic. Do not take these medications with alcohol. A steam or ultrasonic humidifier can help congestion.  You can place a towel over your  head and breathe in the steam from hot water coming from a faucet. Avoid close contacts especially the very young and the elderly. Cover your mouth when you cough or sneeze. Always remember to wash your hands.  Get Help Right Away If: You develop worsening fever or sinus pain. You develop a severe head ache or visual changes. Your symptoms persist after you have completed your treatment plan.  Make sure you Understand these instructions. Will watch your condition. Will get help right away if you are not doing well or get worse.  Thank you for choosing an e-visit.  Your e-visit answers were reviewed by a board certified advanced clinical practitioner to complete your personal care plan. Depending upon the condition, your plan could have included both over the counter or prescription medications.  Please review your pharmacy choice. Make sure the pharmacy is open so you can pick up prescription now. If there is a problem, you may contact your provider through [employer] and have the prescription routed to another pharmacy.  Your safety is important to [name]. If you have drug allergies check your prescription carefully.   For the next 24 hours you can use MyChart to ask questions about today's visit, request a non-urgent call back, or ask for a work or school excuse. You will get an email in the next two days asking about your experience. I hope that  your e-visit has been valuable and will speed your recovery.   I spent approximately 5 minutes reviewing the patient's history, current symptoms and coordinating their care today.

## 2022-05-08 ENCOUNTER — Telehealth (HOSPITAL_COMMUNITY): Payer: Self-pay

## 2022-05-08 DIAGNOSIS — F900 Attention-deficit hyperactivity disorder, predominantly inattentive type: Secondary | ICD-10-CM

## 2022-05-08 MED ORDER — METHYLPHENIDATE HCL 5 MG PO TABS: 5.0000 mg | ORAL_TABLET | Freq: Every day | ORAL | 0 refills | Status: DC

## 2022-05-08 NOTE — Telephone Encounter (Signed)
Patient called to request a refill for the following medication Last visit 02/25/22 Next visit 05/20/22      Disp Refills Start End   methylphenidate (RITALIN) 5 MG tablet 30 tablet 0 04/08/2022    Sig - Route: Take 1 tablet (5 mg total) by mouth daily in the afternoon. - Oral   Sent to pharmacy as: methylphenidate (RITALIN) 5 MG tablet   Earliest Fill Date: 04/08/2022   Notes to Pharmacy: Do not refill until 04/08/22   E-Prescribing Status: Receipt confirmed by pharmacy (04/08/2022 12:23 PM EST)      Pharmacy verified [facility] [id number], [place] Phone: [phone]  Fax: [phone]

## 2022-05-08 NOTE — Telephone Encounter (Signed)
Done

## 2022-05-12 DIAGNOSIS — H6992 Unspecified Eustachian tube disorder, left ear: Secondary | ICD-10-CM | POA: Diagnosis not present

## 2022-05-12 DIAGNOSIS — J0100 Acute maxillary sinusitis, unspecified: Secondary | ICD-10-CM | POA: Diagnosis not present

## 2022-05-13 ENCOUNTER — Telehealth: Payer: Self-pay | Admitting: Internal Medicine

## 2022-05-13 DIAGNOSIS — K219 Gastro-esophageal reflux disease without esophagitis: Secondary | ICD-10-CM

## 2022-05-13 MED ORDER — OMEPRAZOLE 20 MG PO CPDR: DELAYED_RELEASE_CAPSULE | ORAL | 3 refills | Status: DC

## 2022-05-13 NOTE — Telephone Encounter (Signed)
Pt need refill on omeprazole sent to [facility]

## 2022-05-13 NOTE — Telephone Encounter (Signed)
Refill sent pt has a toc with [facility]

## 2022-05-20 ENCOUNTER — Telehealth (HOSPITAL_BASED_OUTPATIENT_CLINIC_OR_DEPARTMENT_OTHER): Payer: Medicare Other | Admitting: Psychiatry

## 2022-05-20 ENCOUNTER — Encounter (HOSPITAL_COMMUNITY): Payer: Self-pay | Admitting: Psychiatry

## 2022-05-20 DIAGNOSIS — F900 Attention-deficit hyperactivity disorder, predominantly inattentive type: Secondary | ICD-10-CM

## 2022-05-20 DIAGNOSIS — F331 Major depressive disorder, recurrent, moderate: Secondary | ICD-10-CM

## 2022-05-20 MED ORDER — METHYLPHENIDATE HCL 5 MG PO TABS: 5.0000 mg | ORAL_TABLET | Freq: Every day | ORAL | 0 refills | Status: DC

## 2022-05-20 MED ORDER — TRINTELLIX 20 MG PO TABS: 20.0000 mg | ORAL_TABLET | ORAL | 2 refills | Status: DC

## 2022-05-20 MED ORDER — TRAZODONE HCL 50 MG PO TABS: 50.0000 mg | ORAL_TABLET | Freq: Every evening | ORAL | 0 refills | Status: DC | PRN

## 2022-05-20 MED ORDER — LAMOTRIGINE 200 MG PO TABS: 200.0000 mg | ORAL_TABLET | ORAL | 0 refills | Status: DC

## 2022-05-20 NOTE — Progress Notes (Signed)
Virtual Visit via Telephone Note  I connected with [name] on 05/20/22 at  8:20 AM EST by telephone and verified that I am speaking with the correct person using two identifiers.  Location: Patient: Home Provider: Home Office   I discussed the limitations, risks, security and privacy concerns of performing an evaluation and management service by telephone and the availability of in person appointments. I also discussed with the patient that there may be a patient responsible charge related to this service. The patient expressed understanding and agreed to proceed.   History of Present Illness: Patient is evaluated by phone session.  She had a good Christmas.  All 3 children came and she had a good time with the grandkids.  Patient told it was overwhelming, busy but she really enjoyed the time with the family.  She is excited about upcoming trip to [place] to visit her son and grandkids this weekend.  Her birthday also coming end of this month.  She feels the current medicine is working and she denies any crying spells, feeling of hopelessness or worthlessness.  She also in touch with her sister-in-law who lives in [place].  Patient told she had a good support and one of her friend is taking her overseas trip and she is happy for her sister-in-law.  Patient's brother died last year due to complication of bone marrow transplant.  Patient sleeps good with the help of trazodone and CPAP.  Her husband is doing well.  She denies any anhedonia or any feeling of hopelessness or worthlessness.  Her energy level is good.  She drives to the grocery stores and trying to be involved in church activities.  Patient told she and her husband go at least 3 times to church and enjoyed going there.  She has no tremors, shakes or any EPS.  Her appetite is okay.  Her weight is unchanged from the past.  She is able to do multitasking and her attention concentration is good.   Past Psychiatric History:   H/O mania, irritability, impulsive behavior and overdose at age 77. H/O inpatient twice. Had psychological testing and diagnosed ADD. Took Concerta, (Adderall, Vyvanse made anxious) Wellbutrin, Prozac, lithium, Effexor, Lexapro, Abilify and Cymbalta.  Best when given female hormone to help endometriosis.  Had ECT, did not work. TMS helped. No history of psychosis.   Psychiatric Specialty Exam: Physical Exam  Review of Systems  Weight 147 lb (66.7 kg).There is no height or weight on file to calculate BMI.  General Appearance: NA  Eye Contact:  NA  Speech:  Clear and Coherent and Normal Rate  Volume:  Normal  Mood:  Euthymic  Affect:  NA  Thought Process:  Goal Directed  Orientation:  Full (Time, Place, and Person)  Thought Content:  WDL  Suicidal Thoughts:  No  Homicidal Thoughts:  No  Memory:  Immediate;   Good Recent;   Good Remote;   Good  Judgement:  Good  Insight:  Present  Psychomotor Activity:  NA  Concentration:  Concentration: Good and Attention Span: Good  Recall:  Good  Fund of Knowledge:  Good  Language:  Good  Akathisia:  No  Handed:  Right  AIMS (if indicated):     Assets:  Communication Skills Desire for Improvement Housing Resilience Social Support Transportation  ADL's:  Intact  Cognition:  WNL  Sleep:   good with CPAP      Assessment and Plan: Major depressive disorder, recurrent.  Attention deficit disorder, inattentive type.  Patient doing well on medication.  She had a good Christmas.  She is excited about upcoming trip with her husband to [place] to visit her son and grandkids.  She has no rash or any itching to the Lamictal.  Continue Trintellix 20 mg daily, trazodone 50 g at bedtime, Lamictal 200 mg daily and Ritalin 5 mg in the morning.  Recommended to call us back if he has any questions or any concern.  Follow-up in 3 months.  Follow Up Instructions:    I discussed the assessment and treatment plan with the patient. The patient was  provided an opportunity to ask questions and all were answered. The patient agreed with the plan and demonstrated an understanding of the instructions.   The patient was advised to call back or seek an in-person evaluation if the symptoms worsen or if the condition fails to improve as anticipated.  Collaboration of Care: Other provider involved in patient's care AEB notes are available in epic to review  Patient/Guardian was advised Release of Information must be obtained prior to any record release in order to collaborate their care with an outside provider. Patient/Guardian was advised if they have not already done so to contact the registration department to sign all necessary forms in order for [name] to release information regarding their care.   Consent: Patient/Guardian gives verbal consent for treatment and assignment of benefits for services provided during this visit. Patient/Guardian expressed understanding and agreed to proceed.    I provided 12 minutes of non-face-to-face time during this encounter.   Kathlee Nations, MD

## 2022-05-27 ENCOUNTER — Telehealth (HOSPITAL_COMMUNITY): Payer: Medicare Other | Admitting: Psychiatry

## 2022-06-04 ENCOUNTER — Other Ambulatory Visit (HOSPITAL_COMMUNITY): Payer: Self-pay | Admitting: Psychiatry

## 2022-06-04 DIAGNOSIS — F331 Major depressive disorder, recurrent, moderate: Secondary | ICD-10-CM

## 2022-07-02 ENCOUNTER — Encounter: Payer: Self-pay | Admitting: Oncology

## 2022-07-03 ENCOUNTER — Telehealth (HOSPITAL_COMMUNITY): Payer: Self-pay | Admitting: *Deleted

## 2022-07-03 NOTE — Telephone Encounter (Signed)
Pt called with c/o that the Trintellix is just too expensive and she would like to try something else if possible. Writer advised that Dr. Adele Schilder on vacation but would return on 07/06/22 and that there is a covering physician. Pt was asked if she would prefer message for Dr. Adele Schilder or covering provider today. Pt states that she still has some medication and is fine to wait for Dr. Adele Schilder. Pt next appointment scheduled for 08/19/22. Please review.

## 2022-07-06 NOTE — Telephone Encounter (Signed)
In the past she had tried numerous medication that did not help.  She also had ECT that did not work.  Trintellix seems to be a better option at this time.  We can provide samples of Trintellix but cannot guarantee they are available all the time.  Other option is [facility].

## 2022-07-07 ENCOUNTER — Other Ambulatory Visit: Payer: Medicare Other

## 2022-07-07 ENCOUNTER — Ambulatory Visit
Admission: RE | Admit: 2022-07-07 | Discharge: 2022-07-07 | Disposition: A | Discharge status: Home / Self Care | Payer: Medicare Other | Source: Ambulatory Visit | Attending: Oncology | Admitting: Oncology

## 2022-07-07 DIAGNOSIS — I700 Atherosclerosis of aorta: Secondary | ICD-10-CM | POA: Insufficient documentation

## 2022-07-07 DIAGNOSIS — K5730 Diverticulosis of large intestine without perforation or abscess without bleeding: Secondary | ICD-10-CM | POA: Diagnosis not present

## 2022-07-07 DIAGNOSIS — Z9049 Acquired absence of other specified parts of digestive tract: Secondary | ICD-10-CM | POA: Diagnosis not present

## 2022-07-07 DIAGNOSIS — Z85038 Personal history of other malignant neoplasm of large intestine: Secondary | ICD-10-CM | POA: Diagnosis not present

## 2022-07-07 DIAGNOSIS — C189 Malignant neoplasm of colon, unspecified: Secondary | ICD-10-CM | POA: Diagnosis not present

## 2022-07-07 LAB — POCT I-STAT CREATININE: Creatinine, Ser: 0.9 mg/dL (ref 0.44–1.00)

## 2022-07-07 MED ORDER — IOHEXOL 300 MG/ML  SOLN
100.0000 mL | Freq: Once | INTRAMUSCULAR | Status: AC | PRN
  Administered 2022-07-07: 100 mL via INTRAVENOUS

## 2022-07-07 MED FILL — Iron Sucrose Inj 20 MG/ML (Fe Equiv): INTRAVENOUS | Qty: 10 | Status: AC

## 2022-07-07 NOTE — Telephone Encounter (Signed)
Will advise

## 2022-07-07 NOTE — Telephone Encounter (Signed)
There is no guarantee that she can come off from Trintellix after [place] but dose can be reduced if she remains stable after [place] treatment.

## 2022-07-07 NOTE — Telephone Encounter (Signed)
Pt says she's ok with the Trintellix until her next appointment on 08/19/22. Pt does admit that the Trintellix has been working but asked if she may take it QOD. Writer advised pt she would ask provider however this is not general practice. Samples offered. Pt assistance forms given to pt previously with samples. TMS offered. Pt responded asking if she may then come off the Trintellix after [place]. Please review and advise.

## 2022-07-08 ENCOUNTER — Encounter: Payer: Self-pay | Admitting: Oncology

## 2022-07-08 ENCOUNTER — Inpatient Hospital Stay: Payer: Medicare Other | Attending: Oncology

## 2022-07-08 DIAGNOSIS — Z85038 Personal history of other malignant neoplasm of large intestine: Secondary | ICD-10-CM | POA: Diagnosis not present

## 2022-07-08 DIAGNOSIS — D72820 Lymphocytosis (symptomatic): Secondary | ICD-10-CM | POA: Diagnosis not present

## 2022-07-08 DIAGNOSIS — Z79899 Other long term (current) drug therapy: Secondary | ICD-10-CM | POA: Diagnosis not present

## 2022-07-08 LAB — COMPREHENSIVE METABOLIC PANEL
ALT: 14 U/L (ref 0–44)
AST: 16 U/L (ref 15–41)
Albumin: 4 g/dL (ref 3.5–5.0)
Alkaline Phosphatase: 56 U/L (ref 38–126)
Anion gap: 8 (ref 5–15)
BUN: 9 mg/dL (ref 8–23)
CO2: 29 mmol/L (ref 22–32)
Calcium: 9.4 mg/dL (ref 8.9–10.3)
Chloride: 103 mmol/L (ref 98–111)
Creatinine, Ser: 1.02 mg/dL — ABNORMAL HIGH (ref 0.44–1.00)
GFR, Estimated: 57 mL/min — ABNORMAL LOW (ref >60)
Glucose, Bld: 84 mg/dL (ref 70–99)
Potassium: 4.3 mmol/L (ref 3.5–5.1)
Sodium: 140 mmol/L (ref 135–145)
Total Bilirubin: 0.5 mg/dL (ref 0.3–1.2)
Total Protein: 6.3 g/dL — ABNORMAL LOW (ref 6.5–8.1)

## 2022-07-08 LAB — CBC WITH DIFFERENTIAL/PLATELET
Abs Immature Granulocytes: 0.02 10*3/uL (ref 0.00–0.07)
Basophils Absolute: 0.1 10*3/uL (ref 0.0–0.1)
Basophils Relative: 1 %
Eosinophils Absolute: 0.1 10*3/uL (ref 0.0–0.5)
Eosinophils Relative: 1 %
HCT: 41.6 % (ref 36.0–46.0)
Hemoglobin: 13.5 g/dL (ref 12.0–15.0)
Immature Granulocytes: 0 %
Lymphocytes Relative: 42 %
Lymphs Abs: 3.2 10*3/uL (ref 0.7–4.0)
MCH: 30.8 pg (ref 26.0–34.0)
MCHC: 32.5 g/dL (ref 30.0–36.0)
MCV: 95 fL (ref 80.0–100.0)
Monocytes Absolute: 0.4 10*3/uL (ref 0.1–1.0)
Monocytes Relative: 5 %
Neutro Abs: 3.9 10*3/uL (ref 1.7–7.7)
Neutrophils Relative %: 51 %
Platelets: 232 10*3/uL (ref 150–400)
RBC: 4.38 MIL/uL (ref 3.87–5.11)
RDW: 12.8 % (ref 11.5–15.5)
WBC: 7.6 10*3/uL (ref 4.0–10.5)
nRBC: 0 % (ref 0.0–0.2)

## 2022-07-10 ENCOUNTER — Encounter: Payer: Self-pay | Admitting: Oncology

## 2022-07-10 ENCOUNTER — Inpatient Hospital Stay (HOSPITAL_BASED_OUTPATIENT_CLINIC_OR_DEPARTMENT_OTHER): Payer: Medicare Other | Admitting: Oncology

## 2022-07-10 ENCOUNTER — Ambulatory Visit: Payer: Medicare Other | Admitting: Internal Medicine

## 2022-07-10 VITALS — BP 103/52 | HR 70 | Temp 97.6°F | Resp 18 | Wt 149.2 lb

## 2022-07-10 DIAGNOSIS — D72820 Lymphocytosis (symptomatic): Secondary | ICD-10-CM | POA: Diagnosis not present

## 2022-07-10 DIAGNOSIS — Z85038 Personal history of other malignant neoplasm of large intestine: Secondary | ICD-10-CM | POA: Diagnosis not present

## 2022-07-10 DIAGNOSIS — Z79899 Other long term (current) drug therapy: Secondary | ICD-10-CM | POA: Diagnosis not present

## 2022-07-10 DIAGNOSIS — C189 Malignant neoplasm of colon, unspecified: Secondary | ICD-10-CM

## 2022-07-10 LAB — COMP PANEL: LEUKEMIA/LYMPHOMA: Immunophenotypic Profile: 20

## 2022-07-10 LAB — CEA: CEA: 1.7 ng/mL (ref 0.0–4.7)

## 2022-07-10 NOTE — Progress Notes (Signed)
Hematology/Oncology Progress note [phone] [phone]      REASON FOR VISIT Follow up for treatment of management of colon cancer and iron deficiency anemia.  ASSESSMENT & PLAN:   Cancer Staging  Cancer of right colon [facility]) Staging form: Colon and Rectum, AJCC 8th Edition - Clinical: cT1 - Unsigned - Pathologic stage from 03/08/2017: Stage IIA (pT3, pN0, cM0) - Signed by Earlie Server, MD on 03/08/2017   Colon cancer [facility]) #History of stage IIA right side colon cancer (03/2017), with high risk feasures: Lymphovascular Invasion, Perineural Invasion, Patient declined adjuvant chemotherapy. Labs are reviewed and discussed with patient CEA is stable and normal. Not elevated at baseline.  07/08/2022 CT chest abdomen pelvis w contrast showed no recurrence.  She is 5 year after surgery, will stop routine CT scan.  Future Imaging as clinically indicated.   Monoclonal B-cell lymphocytosis of undetermined significance She does not have any constitutional symptoms. No lymphadenopathy on physical exam.  Continue observation.   Orders Placed This Encounter  Procedures   CBC with Differential ([place] Only)    Standing Status:   Future    Standing Expiration Date:   07/10/2023   CMP ([place] only)    Standing Status:   Future    Standing Expiration Date:   07/10/2023   CEA    Standing Status:   Future    Standing Expiration Date:   07/10/2023   Flow cytometry panel-leukemia/lymphoma work-up    Standing Status:   Future    Standing Expiration Date:   07/10/2023   Follow up in 1 year All questions were answered. The patient knows to call the clinic with any problems, questions or concerns.  Earlie Server, MD, PhD [facility] Hematology Oncology 07/10/2022    HISTORY OF PRESENTING ILLNESS:  a colonoscopy done on 12/25/2016 by Dr. Vira Agar. Moves her bowels daily. Patient states she is nausea, states it started at the end of June .She states she was having pain in her  left upper quadrant pain was seen in the ER on 12/24/2016 Upper and lower endoscopies dated 12/28/2016 reviewed which showed Cecal mass, biopsy-proven adenocarcinoma. Patient underwent laparoscopically assisted right hemicolectomy with ileotransverse colostomy. Pathology revealed pT3N0 adenocarcinoma, with perineural invasion and lymphvascular invasion  #  Cancer Stage: pT3N0cM0, stage colon cancer.  Lymphovascular Invasion:  Present  Perineural Invasion:  Present  Patient declined adjuvant chemotherapy. MMR- intact.  #Repeat colonoscopy 01/17/2018 by Dr. Vira Agar showed diverticulosis in the sigmoid colon, in the descending colon and transverse colon.  Internal hemorrhoids.  No specimen collected.  #Nausea.  Chronic problem for her.Etiology unclear.    07/06/2021 CT chest abdomen pelvis w contrast showed no recurrence.   Interval History Patient presents for follow-up of history of stage II colon cancer and monoclonal lymphocytosis.  She reports feeling well. Denies weight loss, fever, chills, fatigue, night sweats.  No abdominal pain blood in stool.   Review of Systems  Constitutional:  Negative for chills, fever, malaise/fatigue and weight loss.  HENT:  Negative for sore throat.   Eyes:  Negative for redness.  Respiratory:  Negative for cough, shortness of breath and wheezing.   Cardiovascular:  Negative for chest pain, palpitations and leg swelling.  Gastrointestinal:  Positive for nausea. Negative for abdominal pain, blood in stool and vomiting.  Genitourinary:  Negative for dysuria.  Musculoskeletal:  Negative for myalgias.  Skin:  Negative for rash.  Neurological:  Negative for dizziness, tingling and tremors.  Endo/Heme/Allergies:  Does not bruise/bleed easily.  Psychiatric/Behavioral:  Negative for hallucinations.      MEDICAL HISTORY:  Past Medical History:  Diagnosis Date   Allergy    Anemia    Carcinoma ([place])    Colon cancer ([place]) 01/2017   Colon polyps     Diverticulitis    01/2018 abscess hosp. 01/2018   Endometriosis    Fibromyalgia    Gastric polyps    GERD (gastroesophageal reflux disease)    Hemorrhoids    Hypothyroidism    Iron deficiency anemia 03/08/2017   Major depressive disorder, recurrent episode, mild (HCC)    Dr. Adele Schilder in [place] h/o ECT x 6 x and hosp x 2 and transcranial magnetic treatment    Miscarriage    x2   PONV (postoperative nausea and vomiting)    Skin cancer    arm, thigh and chest Dr. Evorn Gong    Sleep apnea    USES CPAP   UTI (urinary tract infection)     SURGICAL HISTORY: Past Surgical History:  Procedure Laterality Date   ABDOMINAL HYSTERECTOMY  1990   endometriosis   BREAST BIOPSY Left 2004   benign   CATARACT EXTRACTION W/PHACO Right 10/26/2018   Procedure: CATARACT EXTRACTION PHACO AND INTRAOCULAR LENS PLACEMENT ([place]) RIGHT TORIC LENS;  Surgeon: Leandrew Koyanagi, MD;  Location: [place];  Service: Ophthalmology;  Laterality: Right;  sleep apnea   CATARACT EXTRACTION, BILATERAL Bilateral 05/2012   CHOLECYSTECTOMY  2001   COLONOSCOPY WITH PROPOFOL N/A 12/28/2016   Procedure: COLONOSCOPY WITH PROPOFOL;  Surgeon: Manya Silvas, MD;  Location: [facility] ENDOSCOPY;  Service: Endoscopy;  Laterality: N/A;   COLONOSCOPY WITH PROPOFOL N/A 01/17/2018   Procedure: COLONOSCOPY WITH PROPOFOL;  Surgeon: Manya Silvas, MD;  Location: [facility] ENDOSCOPY;  Service: Endoscopy;  Laterality: N/A;   ESOPHAGOGASTRODUODENOSCOPY (EGD) WITH PROPOFOL N/A 12/28/2016   Procedure: ESOPHAGOGASTRODUODENOSCOPY (EGD) WITH PROPOFOL;  Surgeon: Manya Silvas, MD;  Location[facility] ENDOSCOPY;  Service: Endoscopy;  Laterality: N/A;   EYE SURGERY     lens left eye, cataract right eye    LAPAROSCOPIC RIGHT COLECTOMY Right 01/21/2017   Procedure: LAPAROSCOPIC ASSISTED RIGHT COLECTOMY;  Surgeon: Robert Bellow, MD;  Location: [facility] ORS;  Service: General;  Laterality: Right;   TONSILLECTOMY      SOCIAL HISTORY: Social History    Socioeconomic History   Marital status: Married    Spouse name: Not on file   Number of children: Not on file   Years of education: Not on file   Highest education level: Not on file  Occupational History   Not on file  Tobacco Use   Smoking status: Never   Smokeless tobacco: Never  Vaping Use   Vaping Use: Never used  Substance and Sexual Activity   Alcohol use: Not Currently    Alcohol/week: 0.0 standard drinks of alcohol    Comment:     Drug use: No   Sexual activity: Yes    Partners: Male    Birth control/protection: None  Other Topics Concern   Not on file  Social History Narrative   Light smoker age 50 stopped    2 sons    Married    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, housewife    Owns guns, wears seat belt, safe in relationship    Social Determinants of Health   Financial Resource Strain: Low Risk  (04/09/2021)   Overall Financial Resource Strain (CARDIA)    Difficulty of Paying Living Expenses: Not hard at all  Food Insecurity: No Food Insecurity (04/09/2021)  Hunger Vital Sign    Worried About Running Out of Food in the Last Year: Never true    Ran Out of Food in the Last Year: Never true  Transportation Needs: No Transportation Needs (04/09/2021)   PRAPARE - Hydrologist (Medical): No    Lack of Transportation (Non-Medical): No  Physical Activity: Not on file  Stress: No Stress Concern Present (04/09/2021)   [place]    Feeling of Stress : Not at all  Social Connections: Unknown (04/09/2021)   Social Connection and Isolation Panel [NHANES]    Frequency of Communication with Friends and Family: Not on file    Frequency of Social Gatherings with Friends and Family: Not on file    Attends Religious Services: Not on file    Active Member of Clubs or Organizations: Not on file    Attends Archivist Meetings: Not on file    Marital Status: Married  Intimate Partner  Violence: Not At Risk (04/09/2021)   Humiliation, Afraid, Rape, and Kick questionnaire    Fear of Current or Ex-Partner: No    Emotionally Abused: No    Physically Abused: No    Sexually Abused: No    FAMILY HISTORY: Family History  Problem Relation Age of Onset   Depression Brother    Alcohol abuse Brother    Suicidality Brother    Stroke Mother    Hyperlipidemia Mother    Hypertension Mother    Clotting disorder Brother    Cancer Brother        MDS   Colon polyps Father    Melanoma Sister    Cancer Sister        skin MM   Stroke Maternal Grandfather    Melanoma Nephew    Bipolar disorder Neg Hx     ALLERGIES:  is allergic to other and codeine.  MEDICATIONS:  Current Outpatient Medications  Medication Sig Dispense Refill   benzonatate (TESSALON) 100 MG capsule Take 1 capsule (100 mg total) by mouth 3 (three) times daily as needed. 30 capsule 0   calcium-vitamin D (OSCAL WITH D) 500-200 MG-UNIT tablet Take 2 tablets by mouth 2 (two) times daily.     cetirizine (ZYRTEC) 10 MG tablet Take 10 mg by mouth daily as needed for allergies.      estradiol (ESTRACE) 0.5 MG tablet Take 1 tablet (0.5 mg total) by mouth daily. 90 tablet 3   lamoTRIgine (LAMICTAL) 200 MG tablet Take 1 tablet (200 mg total) by mouth every morning. 90 tablet 0   levothyroxine (SYNTHROID) 25 MCG tablet Take 1 tablet (25 mcg total) by mouth daily before breakfast. 30 minutes 90 tablet 3   methylphenidate (RITALIN) 5 MG tablet Take 1 tablet (5 mg total) by mouth daily in the afternoon. 30 tablet 0   Multiple Vitamins-Minerals (MULTIVITAMIN WITH MINERALS) tablet Take 1 tablet by mouth daily.     omeprazole (PRILOSEC) 20 MG capsule TAKE ONE CAPSULE BY MOUTH DAILY 30 MINUTES PRIOR TO LUNCH. MUST WAIT 3-4 HOURS AFTER THYROID PILL 90 capsule 3   ondansetron (ZOFRAN-ODT) 4 MG disintegrating tablet Take 1 tablet (4 mg total) by mouth 2 (two) times daily as needed for nausea or vomiting. AND PRN if this continues see  gastroenterology 180 tablet 3   Probiotic Product (PROBIOTIC PO) Take by mouth.     traZODone (DESYREL) 50 MG tablet Take 1 tablet (50 mg total) by mouth at bedtime as  needed for sleep. 90 tablet 0   TRINTELLIX 20 MG TABS tablet Take 1 tablet (20 mg total) by mouth every morning. 30 tablet 2   No current facility-administered medications for this visit.      Marland Kitchen  PHYSICAL EXAMINATION: ECOG PERFORMANCE STATUS: 1 - Symptomatic but completely ambulatory Vitals:   07/10/22 1154  BP: (!) 103/52  Pulse: 70  Resp: 18  Temp: 97.6 F (36.4 C)   Filed Weights   07/10/22 1154  Weight: 149 lb 3.2 oz (67.7 kg)   Physical Exam Constitutional:      General: She is not in acute distress.    Appearance: She is not diaphoretic.  HENT:     Head: Normocephalic and atraumatic.     Mouth/Throat:     Pharynx: No oropharyngeal exudate.  Eyes:     General: No scleral icterus.    Pupils: Pupils are equal, round, and reactive to light.  Cardiovascular:     Rate and Rhythm: Normal rate and regular rhythm.     Heart sounds: No murmur heard. Pulmonary:     Effort: Pulmonary effort is normal. No respiratory distress.     Breath sounds: No rales.  Chest:     Chest wall: No tenderness.  Abdominal:     General: There is no distension.     Palpations: Abdomen is soft.     Tenderness: There is no abdominal tenderness.  Musculoskeletal:        General: Normal range of motion.     Cervical back: Normal range of motion and neck supple.  Skin:    General: Skin is warm and dry.     Findings: No erythema.  Neurological:     Mental Status: She is alert and oriented to person, place, and time.  Psychiatric:        Mood and Affect: Affect normal.      LABORATORY DATA:  I have reviewed the data as listed    Latest Ref Rng & Units 07/08/2022    9:12 AM 01/23/2022    9:42 AM 07/31/2021   12:00 AM  CBC  WBC 4.0 - 10.5 K/uL 7.6  8.9  9.5   Hemoglobin 12.0 - 15.0 g/dL 13.5  12.9  13.2   Hematocrit 36.0  - 46.0 % 41.6  38.7  39.7   Platelets 150 - 400 K/uL 232  272.0  268       Latest Ref Rng & Units 07/08/2022    9:12 AM 07/07/2022    2:42 PM 01/23/2022    9:42 AM  CMP  Glucose 70 - 99 mg/dL 84   78   BUN 8 - 23 mg/dL 9   11   Creatinine 0.44 - 1.00 mg/dL 1.02  0.90  1.07   Sodium 135 - 145 mmol/L 140   141   Potassium 3.5 - 5.1 mmol/L 4.3   4.2   Chloride 98 - 111 mmol/L 103   102   CO2 22 - 32 mmol/L 29   31   Calcium 8.9 - 10.3 mg/dL 9.4   9.7   Total Protein 6.5 - 8.1 g/dL 6.3   6.3   Total Bilirubin 0.3 - 1.2 mg/dL 0.5   0.4   Alkaline Phos 38 - 126 U/L 56   55   AST 15 - 41 U/L 16   14   ALT 0 - 44 U/L 14   15       RADIOGRAPHIC STUDIES: I have personally reviewed the  radiological images as listed and agreed with the findings in the report. CT CHEST ABDOMEN PELVIS W CONTRAST  Result Date: 07/08/2022 CLINICAL DATA:  Colon carcinoma. Previous right colectomy. Surveillance. * Tracking Code: BO * EXAM: CT CHEST, ABDOMEN, AND PELVIS WITH CONTRAST TECHNIQUE: Multidetector CT imaging of the chest, abdomen and pelvis was performed following the standard protocol during bolus administration of intravenous contrast. RADIATION DOSE REDUCTION: This exam was performed according to the departmental dose-optimization program which includes automated exposure control, adjustment of the mA and/or kV according to patient size and/or use of iterative reconstruction technique. CONTRAST:  126m OMNIPAQUE IOHEXOL 300 MG/ML  SOLN COMPARISON:  07/04/2021 FINDINGS: CT CHEST FINDINGS Cardiovascular: No acute findings. Mediastinum/Lymph Nodes: No masses or pathologically enlarged lymph nodes identified. Lungs/Pleura: No suspicious pulmonary nodules or masses identified. No evidence of infiltrate or pleural effusion. Musculoskeletal:  No suspicious bone lesions identified. CT ABDOMEN AND PELVIS FINDINGS Hepatobiliary: Tiny cyst in segment 3 of the left hepatic lobe remains stable. No masses identified. Prior  cholecystectomy. No evidence of biliary obstruction. Pancreas:  No mass or inflammatory changes. Spleen:  Within normal limits in size and appearance. Adrenals/Urinary tract: No suspicious masses or hydronephrosis. Stomach/Bowel: Stable postop changes from previous right colectomy. No masses identified. No evidence of obstruction, inflammatory process, or abnormal fluid collections. Diverticulosis is seen mainly involving the descending and sigmoid colon, however there is no evidence of diverticulitis. Vascular/Lymphatic: No pathologically enlarged lymph nodes identified. No acute vascular findings. Aortic atherosclerotic calcification incidentally noted. Reproductive: Prior hysterectomy noted. Adnexal regions are unremarkable in appearance. Other:  None. Musculoskeletal:  No suspicious bone lesions identified. IMPRESSION: Stable exam. No evidence of recurrent or metastatic carcinoma within the chest, abdomen, or pelvis. Colonic diverticulosis, without radiographic evidence of diverticulitis. Aortic Atherosclerosis (ICD10-I70.0). Electronically Signed   By: JMarlaine HindM.D.   On: 07/08/2022 15:16

## 2022-07-10 NOTE — Assessment & Plan Note (Signed)
She does not have any constitutional symptoms. No lymphadenopathy on physical exam.  Continue observation.

## 2022-07-10 NOTE — Assessment & Plan Note (Addendum)
#  History of stage IIA right side colon cancer (03/2017), with high risk feasures: Lymphovascular Invasion, Perineural Invasion, Patient declined adjuvant chemotherapy. Labs are reviewed and discussed with patient CEA is stable and normal. Not elevated at baseline.  07/08/2022 CT chest abdomen pelvis w contrast showed no recurrence.  She is 5 year after surgery, will stop routine CT scan.  Future Imaging as clinically indicated.

## 2022-07-10 NOTE — Progress Notes (Signed)
Pt here for follow up. Reports that she has been having constant headaches on left side, for approx 1 year, with no improvement.

## 2022-07-22 ENCOUNTER — Ambulatory Visit (INDEPENDENT_AMBULATORY_CARE_PROVIDER_SITE_OTHER): Payer: Medicare Other | Admitting: Family Medicine

## 2022-07-22 ENCOUNTER — Encounter: Payer: Self-pay | Admitting: Family Medicine

## 2022-07-22 ENCOUNTER — Ambulatory Visit: Payer: Medicare Other | Admitting: Family Medicine

## 2022-07-22 VITALS — BP 118/65 | HR 74 | Temp 97.8°F | Ht 62.0 in | Wt 149.4 lb

## 2022-07-22 DIAGNOSIS — I700 Atherosclerosis of aorta: Secondary | ICD-10-CM | POA: Diagnosis not present

## 2022-07-22 DIAGNOSIS — R7309 Other abnormal glucose: Secondary | ICD-10-CM

## 2022-07-22 DIAGNOSIS — F39 Unspecified mood [affective] disorder: Secondary | ICD-10-CM | POA: Diagnosis not present

## 2022-07-22 DIAGNOSIS — R232 Flushing: Secondary | ICD-10-CM | POA: Diagnosis not present

## 2022-07-22 DIAGNOSIS — E785 Hyperlipidemia, unspecified: Secondary | ICD-10-CM | POA: Diagnosis not present

## 2022-07-22 DIAGNOSIS — Z1231 Encounter for screening mammogram for malignant neoplasm of breast: Secondary | ICD-10-CM | POA: Diagnosis not present

## 2022-07-22 DIAGNOSIS — E039 Hypothyroidism, unspecified: Secondary | ICD-10-CM

## 2022-07-22 DIAGNOSIS — K2270 Barrett's esophagus without dysplasia: Secondary | ICD-10-CM

## 2022-07-22 DIAGNOSIS — Z85038 Personal history of other malignant neoplasm of large intestine: Secondary | ICD-10-CM

## 2022-07-22 NOTE — Patient Instructions (Addendum)
It was a pleasure meeting you today. Thank you for allowing me to take part in your health care.  Our goals for today as we discussed include:    Schedule annual physical  for September Schedule lab appointment for 1 week prior to office visit.  Fast for 10 hours.    If you have any questions or concerns, please do not hesitate to call the office at [phone].  I look forward to our next visit and until then take care and stay safe.  Regards,   Carollee Leitz, MD   [facility]

## 2022-07-22 NOTE — Progress Notes (Signed)
SUBJECTIVE:   Chief Complaint  Patient presents with   Transitions Of Care   HPI Patient presents to clinic to transfer care.  No acute concerns.  Mood disorder Doing well.  Follows with Dr. Adele Schilder, psychiatry.  Currently aching lamotrigine 200 mg daily, methylphenidate 5 mg q. afternoon, Trintellix 20 mg daily and trazodone 50 mg nightly.  Denies any SI/HI.  HRT use Long-term use Estrace greater than 10 years.  Takes Estrace 0.5 mg daily.  Previously tried discontinuing but hot flashes returned.  No history of MI, VTE or hypertension.  Hypothyroid Asymptomatic.  Currently on levothyroxine 25 mcg's daily.  Tolerating well.  Last TSH within normal limits.   PERTINENT PMH / PSH: Hypothyroid Mood disorder History of colon cancer.  No chemo.  Status post resection 2018.  Colonoscopy History of Barrett's esophagus.  EGD due 08/2023 History of MUGUS Hyperlipidemia Fibromyalgia    OBJECTIVE:  BP 118/65   Pulse 74   Temp 97.8 F (36.6 C) (Oral)   Ht 5\' 2"  (1.575 m)   Wt 149 lb 6.4 oz (67.8 kg)   LMP  (LMP Unknown)   SpO2 98%   BMI 27.33 kg/m    Physical Exam Vitals reviewed.  Constitutional:      General: She is not in acute distress.    Appearance: She is not ill-appearing.  HENT:     Head: Normocephalic.     Nose: Nose normal.  Eyes:     Conjunctiva/sclera: Conjunctivae normal.  Neck:     Thyroid: No thyromegaly or thyroid tenderness.  Cardiovascular:     Rate and Rhythm: Normal rate and regular rhythm.     Heart sounds: Normal heart sounds.  Pulmonary:     Effort: Pulmonary effort is normal.     Breath sounds: Normal breath sounds.  Abdominal:     General: Abdomen is flat. Bowel sounds are normal.     Palpations: Abdomen is soft.  Musculoskeletal:        General: Normal range of motion.     Cervical back: Normal range of motion.  Neurological:     Mental Status: She is alert and oriented to person, place, and time. Mental status is at baseline.   Psychiatric:        Mood and Affect: Mood normal.        Behavior: Behavior normal.        Thought Content: Thought content normal.        Judgment: Judgment normal.     ASSESSMENT/PLAN:  Hypothyroidism, unspecified type Assessment & Plan: Chronic.  Stable.  Recent TSH within normal limits Continue levothyroxine 25 mcg's daily Repeat TSH annually  Orders: -     TSH; Future  Aortic atherosclerosis [facility]) Assessment & Plan: Noted on CT abdomen/pelvis 9/21.  Not currently on statin therapy. Check annual lipids Plan to discuss statin therapy at next visit Could consider CT calcium score    Mood disorder [facility]) Assessment & Plan: Chronic.  Stable.  Doing well on current medications. Continue lamotrigine 200 mg daily Continue Ritalin 5 mg q. afternoon Continue Trintellix 20 mg daily Continue trazodone 50 mg nightly Follow-up with psychiatry as scheduled for continued management.   Barrett's esophagus without dysplasia Assessment & Plan: Continue omeprazole 20 mg daily Follows with Dr. Alice Reichert at [place] [facility].  Repeat EGD due 08/2023.   History of colon cancer Assessment & Plan: Status post right colectomy 2018.  Recommended colonoscopy every 2 years. Patient reports discussed with oncologist, Dr. Tasia Catchings and decision  to discontinue colonoscopies for cancer screening. On review of note from Dr. Tasia Catchings given that patient is 5 years postsurgery recommended discontinuing routine CT scan.   Patient not interested in continuing colonoscopies for screening.     Hyperlipidemia, unspecified hyperlipidemia type Assessment & Plan: Chronic. Fasting lipids at next visit  Orders: -     Lipid panel; Future  Hot flashes Assessment & Plan: Chronic.  Long-term use of Estrace.  Previously tried self weaning but symptoms of hot flashes returned. Tolerating Estrace 0.5 mg po daily Could consider topical use to decrease risk of side effects.  Plan to discuss with patient at next  visit.   Abnormal glucose -     Hemoglobin A1c; Future  Breast cancer screening by mammogram -     3D Screening Mammogram, Left and Right; Future   HCM Medicare annual wellness due DEXA scan completed 11/2021.  T-score -1.9.  FRAX score 11.2% and 2.2% 10-year probability hip fracture Mammogram up-to-date.  Due 7/24.  Order placed Tetanus up-to-date Shingles vaccine and pneumonia vaccine completed. Colonoscopy discontinued at patient's request.  History of colon CA, patient discussed with oncologist and reports collaborative decision to discontinue colonoscopies for screening.  PDMP reviewed  Return in about 6 months (around 01/22/2023) for annual visit with fasting labs 1 week prior.  Carollee Leitz, MD

## 2022-07-28 ENCOUNTER — Encounter: Payer: Self-pay | Admitting: Family Medicine

## 2022-07-28 DIAGNOSIS — F39 Unspecified mood [affective] disorder: Secondary | ICD-10-CM | POA: Insufficient documentation

## 2022-07-28 DIAGNOSIS — R232 Flushing: Secondary | ICD-10-CM | POA: Insufficient documentation

## 2022-07-28 NOTE — Assessment & Plan Note (Signed)
Chronic.  Long-term use of Estrace.  Previously tried self weaning but symptoms of hot flashes returned. Tolerating Estrace 0.5 mg po daily Could consider topical use to decrease risk of side effects.  Plan to discuss with patient at next visit.

## 2022-07-28 NOTE — Assessment & Plan Note (Signed)
Chronic.  Stable.  Recent TSH within normal limits Continue levothyroxine 25 mcg's daily Repeat TSH annually

## 2022-07-28 NOTE — Assessment & Plan Note (Signed)
Noted on CT abdomen/pelvis 9/21.  Not currently on statin therapy. Check annual lipids Plan to discuss statin therapy at next visit Could consider CT calcium score

## 2022-07-28 NOTE — Assessment & Plan Note (Addendum)
Continue omeprazole 20 mg daily Follows with Dr. Alice Reichert at [place] [facility].  Repeat EGD due 08/2023.

## 2022-07-28 NOTE — Assessment & Plan Note (Signed)
Chronic.  Stable.  Doing well on current medications. Continue lamotrigine 200 mg daily Continue Ritalin 5 mg q. afternoon Continue Trintellix 20 mg daily Continue trazodone 50 mg nightly Follow-up with psychiatry as scheduled for continued management.

## 2022-07-28 NOTE — Assessment & Plan Note (Signed)
Chronic. Fasting lipids at next visit

## 2022-07-28 NOTE — Assessment & Plan Note (Signed)
Status post right colectomy 2018.  Recommended colonoscopy every 2 years. Patient reports discussed with oncologist, Dr. Tasia Catchings and decision to discontinue colonoscopies for cancer screening. On review of note from Dr. Tasia Catchings given that patient is 5 years postsurgery recommended discontinuing routine CT scan.   Patient not interested in continuing colonoscopies for screening.

## 2022-07-29 ENCOUNTER — Telehealth (HOSPITAL_COMMUNITY): Payer: Self-pay | Admitting: *Deleted

## 2022-07-29 DIAGNOSIS — F900 Attention-deficit hyperactivity disorder, predominantly inattentive type: Secondary | ICD-10-CM

## 2022-07-29 MED ORDER — METHYLPHENIDATE HCL 5 MG PO TABS: 5.0000 mg | ORAL_TABLET | Freq: Every day | ORAL | 0 refills | Status: DC

## 2022-07-29 NOTE — Telephone Encounter (Signed)
Done

## 2022-07-29 NOTE — Telephone Encounter (Signed)
Pt called requesting a refill of the Methylphenidate 5 mg QD. Last e-scribed on 05/20/22 on last visit, pt scheduled for f/u on 08/19/22. Please review.

## 2022-08-11 ENCOUNTER — Encounter: Payer: Self-pay | Admitting: *Deleted

## 2022-08-11 ENCOUNTER — Ambulatory Visit (INDEPENDENT_AMBULATORY_CARE_PROVIDER_SITE_OTHER): Payer: Medicare Other | Admitting: *Deleted

## 2022-08-11 VITALS — BP 118/65 | Ht 62.0 in | Wt 149.4 lb

## 2022-08-11 DIAGNOSIS — D2271 Melanocytic nevi of right lower limb, including hip: Secondary | ICD-10-CM | POA: Diagnosis not present

## 2022-08-11 DIAGNOSIS — D2272 Melanocytic nevi of left lower limb, including hip: Secondary | ICD-10-CM | POA: Diagnosis not present

## 2022-08-11 DIAGNOSIS — Z0000 Encounter for general adult medical examination without abnormal findings: Secondary | ICD-10-CM

## 2022-08-11 DIAGNOSIS — Z872 Personal history of diseases of the skin and subcutaneous tissue: Secondary | ICD-10-CM | POA: Diagnosis not present

## 2022-08-11 DIAGNOSIS — X32XXXA Exposure to sunlight, initial encounter: Secondary | ICD-10-CM | POA: Diagnosis not present

## 2022-08-11 DIAGNOSIS — Z85828 Personal history of other malignant neoplasm of skin: Secondary | ICD-10-CM | POA: Diagnosis not present

## 2022-08-11 DIAGNOSIS — D2261 Melanocytic nevi of right upper limb, including shoulder: Secondary | ICD-10-CM | POA: Diagnosis not present

## 2022-08-11 DIAGNOSIS — L570 Actinic keratosis: Secondary | ICD-10-CM | POA: Diagnosis not present

## 2022-08-11 NOTE — Patient Instructions (Signed)
  [name] , Thank you for taking time to come for your Medicare Wellness Visit. I appreciate your ongoing commitment to your health goals. Please review the following plan we discussed and let me know if I can assist you in the future.   These are the goals we discussed:  Goals      Follow up with pcp as need     As needed        This is a list of the screening recommended for you and due dates:  Health Maintenance  Topic Date Due   COVID-19 Vaccine (5 - 2023-24 season) 01/02/2022   Mammogram  11/13/2022   Flu Shot  12/03/2022   Medicare Annual Wellness Visit  08/11/2023   DTaP/Tdap/Td vaccine (4 - Td or Tdap) 09/06/2028   Pneumonia Vaccine  Completed   DEXA scan (bone density measurement)  Completed   Hepatitis C Screening: USPSTF Recommendation to screen - Ages 57-79 yo.  Completed   Zoster (Shingles) Vaccine  Completed   HPV Vaccine  Aged Out   Colon Cancer Screening  Discontinued

## 2022-08-11 NOTE — Progress Notes (Signed)
I connected with  [name] on 08/11/22 by a audio enabled telemedicine application and verified that I am speaking with the correct person using two identifiers.  Patient Location: Home  Provider Location: Office/Clinic  I discussed the limitations of evaluation and management by telemedicine. The patient expressed understanding and agreed to proceed.   Subjective:   [name] is a 77 y.o. female who presents for Medicare Annual (Subsequent) preventive examination.   Cardiac Risk Factors include: none     Objective:    Today's Vitals   08/11/22 1407  BP: 118/65  Weight: 149 lb 6.4 oz (67.8 kg)  Height: 5\' 2"  (1.575 m)   Body mass index is 27.33 kg/m.     08/11/2022    2:21 PM 07/10/2022   12:34 PM 07/08/2021   12:51 PM 04/09/2021    3:42 PM 07/05/2020    1:10 PM 02/21/2020   10:52 AM 01/05/2020    1:00 PM  Advanced Directives  Does Patient Have a Medical Advance Directive? Yes Yes Yes Yes Yes Yes Yes  Type of Estate agent of [place];Living will Healthcare Power of [place];Living will  Healthcare Power of [place];Living will Living will;Healthcare Power of [employer] Power of [place];Living will Healthcare Power of Attorney  Does patient want to make changes to medical advance directive? No - Patient declined   No - Patient declined  No - Patient declined   Copy of Healthcare Power of Attorney in Chart? Yes - validated most recent copy scanned in chart (See row information)   Yes - validated most recent copy scanned in chart (See row information)  No - copy requested Yes - validated most recent copy scanned in chart (See row information)    Current Medications (verified) Outpatient Encounter Medications as of 08/11/2022  Medication Sig   calcium-vitamin D (OSCAL WITH D) 500-200 MG-UNIT tablet Take 2 tablets by mouth 2 (two) times daily.   cetirizine (ZYRTEC) 10 MG tablet Take 10 mg by mouth daily as needed for  allergies.    estradiol (ESTRACE) 0.5 MG tablet Take 1 tablet (0.5 mg total) by mouth daily.   lamoTRIgine (LAMICTAL) 200 MG tablet Take 1 tablet (200 mg total) by mouth every morning.   levothyroxine (SYNTHROID) 25 MCG tablet Take 1 tablet (25 mcg total) by mouth daily before breakfast. 30 minutes   methylphenidate (RITALIN) 5 MG tablet Take 1 tablet (5 mg total) by mouth daily in the afternoon.   Multiple Vitamins-Minerals (MULTIVITAMIN WITH MINERALS) tablet Take 1 tablet by mouth daily.   omeprazole (PRILOSEC) 20 MG capsule TAKE ONE CAPSULE BY MOUTH DAILY 30 MINUTES PRIOR TO LUNCH. MUST WAIT 3-4 HOURS AFTER THYROID PILL   ondansetron (ZOFRAN-ODT) 4 MG disintegrating tablet Take 1 tablet (4 mg total) by mouth 2 (two) times daily as needed for nausea or vomiting. AND PRN if this continues see gastroenterology   Probiotic Product (PROBIOTIC PO) Take by mouth.   traZODone (DESYREL) 50 MG tablet Take 1 tablet (50 mg total) by mouth at bedtime as needed for sleep.   TRINTELLIX 20 MG TABS tablet Take 1 tablet (20 mg total) by mouth every morning.   No facility-administered encounter medications on file as of 08/11/2022.    Allergies (verified) Other and Codeine   History: Past Medical History:  Diagnosis Date   Allergy    Anemia    Carcinoma    Colon cancer 01/2017   Colon polyps    Diverticulitis    01/2018 abscess  hosp. 01/2018   Endometriosis    Fibromyalgia    Gastric polyps    GERD (gastroesophageal reflux disease)    Hemorrhoids    Hypothyroidism    Iron deficiency anemia 03/08/2017   Major depressive disorder, recurrent episode, mild    Dr. Lolly Mustache in [place] h/o ECT x 6 x and hosp x 2 and transcranial magnetic treatment    Miscarriage    x2   PONV (postoperative nausea and vomiting)    Skin cancer    arm, thigh and chest Dr. Adolphus Birchwood    Sleep apnea    USES CPAP   UTI (urinary tract infection)    Past Surgical History:  Procedure Laterality Date   ABDOMINAL HYSTERECTOMY  1990    endometriosis   BREAST BIOPSY Left 2004   benign   CATARACT EXTRACTION W/PHACO Right 10/26/2018   Procedure: CATARACT EXTRACTION PHACO AND INTRAOCULAR LENS PLACEMENT (IOC) RIGHT TORIC LENS;  Surgeon: Lockie Mola, MD;  Location: [facility];  Service: Ophthalmology;  Laterality: Right;  sleep apnea   CATARACT EXTRACTION, BILATERAL Bilateral 05/2012   CHOLECYSTECTOMY  2001   COLONOSCOPY WITH PROPOFOL N/A 12/28/2016   Procedure: COLONOSCOPY WITH PROPOFOL;  Surgeon: Scot Jun, MD;  Location[facility];  Service: Endoscopy;  Laterality: N/A;   COLONOSCOPY WITH PROPOFOL N/A 01/17/2018   Procedure: COLONOSCOPY WITH PROPOFOL;  Surgeon: Scot Jun, MD;  Location: [facility] END[facility]COPY;  Service: Endoscopy;  Laterality: N/A;   ESOPHAGOGASTRODUODENOSCOPY (EGD) WITH PROPOFOL N/A 12/28/2016   Procedure: ESOPHAGOGASTRODUODENOSCOPY (EGD) WITH PROPOFOL;  Surgeon: Scot Jun, MD;  Location: [facility] END[facility]COPY;  Service: Endoscopy;  Laterality: N/A;   EYE SURGERY     lens left eye, cataract right eye    LAPAROSCOPIC RIGHT COLECTOMY Right 01/21/2017   Procedure: LAPAROSCOPIC ASSISTED RIGHT COLECTOMY;  Surgeon: Earline Mayotte, MD;  Location: [facility];  Service: General;  Laterality: Right;   TONSILLECTOMY     Family History  Problem Relation Age of Onset   Depression Brother    Alcohol abuse Brother    Suicidality Brother    Stroke Mother    Hyperlipidemia Mother    Hypertension Mother    Clotting disorder Brother    Cancer Brother        MDS   Colon polyps Father    Melanoma Sister    Cancer Sister        skin MM   Stroke Maternal Grandfather    Melanoma Nephew    Bipolar disorder Neg Hx    Social History   Socioeconomic History   Marital status: Married    Spouse name: Not on file   Number of children: Not on file   Years of education: Not on file   Highest education level: Not on file  Occupational History   Not on file  Tobacco Use   Smoking status:  Never   Smokeless tobacco: Never  Vaping Use   Vaping Use: Never used  Substance and Sexual Activity   Alcohol use: Not Currently    Alcohol/week: 0.0 standard drinks of alcohol    Comment:     Drug use: No   Sexual activity: Yes    Partners: Male    Birth control/protection: None  Other Topics Concern   Not on file  Social History Narrative   Light smoker age 43 stopped    2 sons    Married    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, housewife    Owns guns, wears seat belt, safe in relationship  Social Determinants of Health   Financial Resource Strain: Low Risk  (08/11/2022)   Overall Financial Resource Strain (CARDIA)    Difficulty of Paying Living Expenses: Not hard at all  Food Insecurity: No Food Insecurity (08/11/2022)   Hunger Vital Sign    Worried About Running Out of Food in the Last Year: Never true    Ran Out of Food in the Last Year: Never true  Transportation Needs: No Transportation Needs (08/11/2022)   PRAPARE - Administrator, Civil Service (Medical): No    Lack of Transportation (Non-Medical): No  Physical Activity: Inactive (08/11/2022)   Exercise Vital Sign    Days of Exercise per Week: 0 days    Minutes of Exercise per Session: 0 min  Stress: No Stress Concern Present (08/11/2022)   [employer] of Occupational Health - Occupational Stress Questionnaire    Feeling of Stress : Not at all  Social Connections: Moderately Integrated (08/11/2022)   Social Connection and Isolation Panel [NHANES]    Frequency of Communication with Friends and Family: More than three times a week    Frequency of Social Gatherings with Friends and Family: Once a week    Attends Religious Services: More than 4 times per year    Active Member of [employer] or Organizations: No    Attends Engineer, structural: Never    Marital Status: Married    Tobacco Counseling Counseling given: Not Answered   Clinical Intake:  Pre-visit preparation completed: Yes  Pain : No/denies pain      BMI - recorded: 27.32 Nutritional Status: BMI 25 -29 Overweight Nutritional Risks: None Diabetes: No  How often do you need to have someone help you when you read instructions, pamphlets, or other written materials from your doctor or pharmacy?: 1 - Never  Diabetic? No  Interpreter Needed?: No      Activities of Daily Living    08/11/2022    2:16 PM  In your present state of health, do you have any difficulty performing the following activities:  Hearing? 0  Vision? 0  Difficulty concentrating or making decisions? 0  Walking or climbing stairs? 0  Dressing or bathing? 0  Doing errands, shopping? 0  Preparing Food and eating ? N  Using the Toilet? N  In the past six months, have you accidently leaked urine? N  Do you have problems with loss of bowel control? N  Managing your Medications? N  Managing your Finances? N  Housekeeping or managing your Housekeeping? N    Patient Care Team: Dana Allan, MD as PCP - General (Family Medicine) Scot Jun, MD (Inactive) (Gastroenterology) Lemar Livings, Merrily Pew, MD (General Surgery) Benita Gutter, RN as Registered Nurse Rickard Patience, MD as Consulting Physician (Oncology)  Indicate any recent Medical Services you may have received from other than [facility] providers in the past year (date may be approximate).     Assessment:   This is a routine wellness examination for [name].  Hearing/Vision screen No results found.  Dietary issues and exercise activities discussed: Current Exercise Habits: The patient does not participate in regular exercise at present   Goals Addressed   None    Depression Screen    08/11/2022    2:16 PM 07/22/2022   12:57 PM 01/23/2022    9:19 AM 09/01/2021   10:10 AM 06/24/2021    2:52 PM 04/09/2021    3:40 PM 12/11/2020   11:41 AM  PHQ 2/9 Scores  PHQ - 2 Score  0 0 0 0 0 0 0  PHQ- 9 Score    0   5    Fall Risk    08/11/2022    2:16 PM 07/22/2022   12:56 PM 01/23/2022    9:19 AM 09/01/2021    10:10 AM 06/24/2021    2:52 PM  Fall Risk   Falls in the past year? 0 0 0 0 0  Number falls in past yr: 0 0 0 0 0  Injury with Fall? 0 0 0 0 0  Risk for fall due to : No Fall Risks No Fall Risks No Fall Risks No Fall Risks No Fall Risks  Follow up  Falls evaluation completed Falls evaluation completed Falls evaluation completed Falls evaluation completed    FALL RISK PREVENTION PERTAINING TO THE HOME:  Any stairs in or around the home? Yes  If so, are there any without handrails? No  Home free of loose throw rugs in walkways, pet beds, electrical cords, etc? Yes  Adequate lighting in your home to reduce risk of falls? Yes   ASSISTIVE DEVICES UTILIZED TO PREVENT FALLS:  Life alert? No  Use of a cane, walker or w/c? No  Grab bars in the bathroom? No  Shower chair or bench in shower? No  Elevated toilet seat or a handicapped toilet? Yes     Cognitive Function:    11/24/2017   10:36 AM  MMSE - Mini Mental State Exam  Orientation to time 5  Orientation to Place 5  Registration 3  Attention/ Calculation 2  Recall 3  Language- name 2 objects 2  Language- repeat 1  Language- follow 3 step command 3  Language- read & follow direction 1  Write a sentence 1  Copy design 1  Total score 27        08/11/2022    2:21 PM 02/20/2019   11:16 AM  6CIT Screen  What Year? 0 points 0 points  What month? 0 points 0 points  What time? 0 points 0 points  Count back from 20 0 points 0 points  Months in reverse 0 points 0 points  Repeat phrase 2 points   Total Score 2 points     Immunizations Immunization History  Administered Date(s) Administered   DTaP 02/15/2014   Fluad Quad(high Dose 65+) 01/12/2020, 01/29/2021, 01/19/2022   Influenza Split 03/08/2017   Influenza-Unspecified 02/01/2018, 01/30/2019, 01/19/2022   PFIZER(Purple Top)SARS-COV-2 Vaccination 05/22/2019, 06/13/2019, 01/03/2020, 10/04/2020   Pneumococcal Conjugate-13 02/15/2014   Pneumococcal Polysaccharide-23  12/25/2009, 05/31/2018   Tdap 08/10/2018, 09/07/2018   Zoster Recombinat (Shingrix) 12/09/2018, 02/15/2019    TDAP status: Up to date  Flu Vaccine status: Up to date  Pneumococcal vaccine status: Up to date  Covid-19 vaccine status: Information provided on how to obtain vaccines.   Qualifies for Shingles Vaccine? No   Zostavax completed No   Shingrix Completed?: Yes  Screening Tests Health Maintenance  Topic Date Due   COVID-19 Vaccine (5 - 2023-24 season) 01/02/2022   MAMMOGRAM  11/13/2022   INFLUENZA VACCINE  12/03/2022   Medicare Annual Wellness (AWV)  08/11/2023   DTaP/Tdap/Td (4 - Td or Tdap) 09/06/2028   Pneumonia Vaccine 70+ Years old  Completed   DEXA SCAN  Completed   Hepatitis C Screening  Completed   Zoster Vaccines- Shingrix  Completed   HPV VACCINES  Aged Out   COLONOSCOPY (Pts 45-64yrs Insurance coverage will need to be confirmed)  Discontinued    Health Maintenance  Health Maintenance Due  Topic Date Due   COVID-19 Vaccine (5 - 2023-24 season) 01/02/2022    Colorectal cancer screening: No longer required.   Mammogram status: Ordered on 07/28/22. Pt provided with contact info and advised to call to schedule appt.   Bone Density status: Completed 11/12/2021. Results reflect: Bone density results: OSTEOPENIA. Repeat every 2 years.  Lung Cancer Screening: (Low Dose CT Chest recommended if Age 53-80 years, 30 pack-year currently smoking OR have quit w/in 15years.) does not qualify.     Additional Screening:  Hepatitis C Screening: does not qualify  Vision Screening: Recommended annual ophthalmology exams for early detection of glaucoma and other disorders of the eye. Is the patient up to date with their annual eye exam?  Yes  Who is the provider or what is the name of the office in which the patient attends annual eye exams? [facility] If pt is not established with a provider, would they like to be referred to a provider to establish care? No  .   Dental Screening: Recommended annual dental exams for proper oral hygiene  Community Resource Referral / Chronic Care Management: CRR required this visit?  No   CCM required this visit?  No      Plan:     I have personally reviewed and noted the following in the patient's chart:   Medical and social history Use of alcohol, tobacco or illicit drugs  Current medications and supplements including opioid prescriptions. Patient is not currently taking opioid prescriptions. Functional ability and status Nutritional status Physical activity Advanced directives List of other physicians Hospitalizations, surgeries, and ER visits in previous 12 months Vitals Screenings to include cognitive, depression, and falls Referrals and appointments  In addition, I have reviewed and discussed with patient certain preventive protocols, quality metrics, and best practice recommendations. A written personalized care plan for preventive services as well as general preventive health recommendations were provided to patient.     Thurmond ButtsLaToya Kaden Dunkel, CMA   08/11/2022   Nurse Notes: Total time spent on telephone visit today was 12 minutes

## 2022-08-16 ENCOUNTER — Other Ambulatory Visit (HOSPITAL_COMMUNITY): Payer: Self-pay | Admitting: Psychiatry

## 2022-08-16 DIAGNOSIS — F331 Major depressive disorder, recurrent, moderate: Secondary | ICD-10-CM

## 2022-08-19 ENCOUNTER — Encounter (HOSPITAL_COMMUNITY): Payer: Self-pay | Admitting: Psychiatry

## 2022-08-19 ENCOUNTER — Telehealth (HOSPITAL_BASED_OUTPATIENT_CLINIC_OR_DEPARTMENT_OTHER): Payer: Medicare Other | Admitting: Psychiatry

## 2022-08-19 DIAGNOSIS — F900 Attention-deficit hyperactivity disorder, predominantly inattentive type: Secondary | ICD-10-CM

## 2022-08-19 DIAGNOSIS — F331 Major depressive disorder, recurrent, moderate: Secondary | ICD-10-CM

## 2022-08-19 MED ORDER — METHYLPHENIDATE HCL 5 MG PO TABS: ORAL_TABLET | ORAL | 0 refills | Status: DC

## 2022-08-19 MED ORDER — TRINTELLIX 20 MG PO TABS
20.0000 mg | ORAL_TABLET | ORAL | 2 refills | Status: DC
Start: 2022-08-19 — End: 2022-11-18

## 2022-08-19 MED ORDER — LAMOTRIGINE 200 MG PO TABS: 200.0000 mg | ORAL_TABLET | ORAL | 0 refills | Status: DC

## 2022-08-19 MED ORDER — TRAZODONE HCL 50 MG PO TABS
50.0000 mg | ORAL_TABLET | Freq: Every evening | ORAL | 0 refills | Status: DC | PRN
Start: 2022-08-19 — End: 2022-11-18

## 2022-08-19 NOTE — Progress Notes (Signed)
[facility] MD Virtual Progress Note   Patient Location: Home Provider Location: Home Office  I connect with patient by telephone and verified that I am speaking with correct person by using two identifiers. I discussed the limitations of evaluation and management by telemedicine and the availability of in person appointments. I also discussed with the patient that there may be a patient responsible charge related to this service. The patient expressed understanding and agreed to proceed.  [name] [id number] 77 y.o.  08/19/2022 8:30 AM  History of Present Illness:  Patient is evaluated by phone session.  She had a good Easter with the grandkids and went to beach.  She reported things are going okay but sometimes she struggle with focus and attention.  She also reported Trintellix is very expensive and she was thinking to stop the medication and try TMS but after not taking the Trintellix with few days she started to feel more tired, lethargic, sad.  She is back on Trintellix.  Oral and she reported her mood now better.  She denies any crying spells, feeling of hopelessness or worthlessness.  Her level is okay.  She sleeps good with CPAP and trazodone.  Patient recently had a visit to her oncologist and primary care doctor for regular follow-up.  Her labs are stable.  She tried to be involved in church activities.  Patient told she keeps Sabbath on Saturday and sometime her medications fall on Saturday refill and she had missed the dose because she could not get the refill on time.  She reported no tremors, shakes or any EPS.  Her husband is doing well.  She denies any anxiety or any panic attack.  Past Psychiatric History: H/O mania, irritability, impulsive behavior and overdose at age 72. H/O inpatient twice. Had psychological testing and diagnosed ADD. Took Concerta, (Adderall, Vyvanse made anxious) Wellbutrin, Prozac, lithium, Effexor, Lexapro, Abilify and Cymbalta.   Best when given female hormone to help endometriosis.  Had ECT, did not work. TMS helped. No history of psychosis.     Outpatient Encounter Medications as of 08/19/2022  Medication Sig   calcium-vitamin D (OSCAL WITH D) 500-200 MG-UNIT tablet Take 2 tablets by mouth 2 (two) times daily.   cetirizine (ZYRTEC) 10 MG tablet Take 10 mg by mouth daily as needed for allergies.    estradiol (ESTRACE) 0.5 MG tablet Take 1 tablet (0.5 mg total) by mouth daily.   lamoTRIgine (LAMICTAL) 200 MG tablet Take 1 tablet (200 mg total) by mouth every morning.   levothyroxine (SYNTHROID) 25 MCG tablet Take 1 tablet (25 mcg total) by mouth daily before breakfast. 30 minutes   methylphenidate (RITALIN) 5 MG tablet Take 1 tablet (5 mg total) by mouth daily in the afternoon.   Multiple Vitamins-Minerals (MULTIVITAMIN WITH MINERALS) tablet Take 1 tablet by mouth daily.   omeprazole (PRILOSEC) 20 MG capsule TAKE ONE CAPSULE BY MOUTH DAILY 30 MINUTES PRIOR TO LUNCH. MUST WAIT 3-4 HOURS AFTER THYROID PILL   ondansetron (ZOFRAN-ODT) 4 MG disintegrating tablet Take 1 tablet (4 mg total) by mouth 2 (two) times daily as needed for nausea or vomiting. AND PRN if this continues see gastroenterology   Probiotic Product (PROBIOTIC PO) Take by mouth.   traZODone (DESYREL) 50 MG tablet Take 1 tablet (50 mg total) by mouth at bedtime as needed for sleep.   TRINTELLIX 20 MG TABS tablet Take 1 tablet (20 mg total) by mouth every morning.   No facility-administered encounter medications on file as of  08/19/2022.    Recent Results (from the past 2160 hour(s))  I-STAT creatinine     Status: None   Collection Time: 07/07/22  2:42 PM  Result Value Ref Range   Creatinine, Ser 0.90 0.44 - 1.00 mg/dL  Flow cytometry panel-leukemia/lymphoma work-up     Status: None   Collection Time: 07/08/22  9:12 AM  Result Value Ref Range   PATH INTERP XXX-IMP Comment     Comment: (NOTE) CD5+, CD23+ clonal B cell population detected, CLL/SLL  phenotype, CD38 negative, 20% of leukocytes, <5,000 cells/uL. (See comment.)    ANNOTATION COMMENT IMP Comment     Comment: (NOTE) A small monoclonal B-cell population with a CLL phenotype is present. If the patient has a prior diagnosis of CLL, then the findings may represent persistent involvement. If the patient is not known to have CLL or SLL, further investigation is recommended if clinically indicated. In the absence of CLL or SLL, small (<5000/uL) clonal B-cell populations in the blood are classified as monoclonal B-cell lymphocytosis. Recommend clinical correlation and follow up as appropriate. Previous similar immunophenotyping results from 08/05/21 are reviewed.    CLINICAL INFO Comment     Comment: (NOTE) Accompanying CBC dated 07/08/2022 shows: WBC count 7.6, Neu 3.9, Lym 3.2, Mon 0.4    Specimen Type Comment     Comment: Peripheral blood   ASSESSMENT OF LEUKOCYTES Comment     Comment: (NOTE) A CD5+, CD23+ clonal B cell population is detected with dim lambda light chain restriction, representing 98% of the B cells and 20% of the leukocytes. The phenotype is typical of chronic lymphocytic leukemia (CLL)/small lymphocytic lymphoma (SLL).  There is no significant expression of CD38. There is no loss of, or aberrant expression of, the pan T cell antigens to suggest a neoplastic T cell process. CD4:CD8 ratio 1.9 No circulating blasts are detected. There is no immunophenotypic evidence of abnormal myeloid maturation. Rare monocytes show partial dim aberrant expression of CD23 and CD56, as well as decreased expression of CD13, nonspecific findings that can be seen in association with both reactive/activated processes as well as neoplastic processes. Analysis of the leukocyte population shows: granulocytes 62%, monocytes 4%, lymphocytes 34%, blasts <0.1%, B cells 20%, T cells 11%, NK cells 3%    % Viable Cells Comment     Comment: 96%   Immunophenotypic Profile  20% of total cells (Phenotype below)     Comment: Comment Abnormal cell population: present    ANALYSIS AND GATING STRATEGY Comment     Comment: (NOTE) 8 color analysis with CD45/SCC gating  Technical-Analysis performed at [facility], [address] [place], [place] [address] Director: Maurine Simmering, MDPhD    IMMUNOPHENOTYPING STUDY Comment     Comment: (NOTE) CD2       (-)            CD3       (-) CD4       (-)            CD5       (+) CD7       (-)            CD8       (-) CD10      (-)            CD11b     (-) CD11c     (+)            CD13      (-) CD14      (-)  CD15      (-) CD16      (-)            CD19      (+) CD20      (+) Dim        CD22      (+) Dim CD23      (+) Bright     CD33      (-) CD34      (-)            CD38      (-) CD45      (+)            CD56      (-) CD57      (-)            CD103     (-) CD117     (-)            FMC-7     (-) HLA-DR    (+)            KAPPA     (-) LAMBDA    (+) Dim        CD64      (-)    PATHOLOGIST NAME Comment     Comment: Lovenia Shuck, M. D.   COMMENT: Comment     Comment: (NOTE) Each antibody in this assay was utilized to assess for potential abnormalities of studied cell populations or to characterize identified abnormalities. This test was developed and its performance characteristics determined by [facility].  It has not been cleared or approved by the U.S. Food and Drug Administration. The FDA has determined that such clearance or approval is not necessary. This test is used for clinical purposes.  It should not be regarded as investigational or for research. Performed At: -[facility] [address] [place], [place] [id number] Maurine Simmering MDPhD [id number] Performed At: [facility] [address] [place], [place] [id number] Maurine Simmering MDPhD [id number]   CEA     Status: None   Collection Time: 07/08/22  9:12 AM  Result Value Ref Range   CEA 1.7 0.0 - 4.7 ng/mL     Comment: (NOTE)                             Nonsmokers          <3.9                             Smokers             <5.6 Roche [employer]ostics Electrochemiluminescence Immunoassay (ECLIA) Values obtained with different assay methods or kits cannot be used interchangeably.  Results cannot be interpreted as absolute evidence of the presence or absence of malignant disease. Performed At: [facility] [address] [place], [place] [id number] Jolene Schimke MD IH:[id number]   CBC with Differential/Platelet     Status: None   Collection Time: 07/08/22  9:12 AM  Result Value Ref Range   WBC 7.6 4.0 - 10.5 K/uL   RBC 4.38 3.87 - 5.11 MIL/uL   Hemoglobin 13.5 12.0 - 15.0 g/dL   HCT 75.6 43.3 - 29.5 %   MCV 95.0 80.0 - 100.0 fL   MCH 30.8 26.0 - 34.0 pg   MCHC 32.5 30.0 - 36.0 g/dL   RDW 18.8 41.6 -  15.5 %   Platelets 232 150 - 400 K/uL   nRBC 0.0 0.0 - 0.2 %   Neutrophils Relative % 51 %   Neutro Abs 3.9 1.7 - 7.7 K/uL   Lymphocytes Relative 42 %   Lymphs Abs 3.2 0.7 - 4.0 K/uL   Monocytes Relative 5 %   Monocytes Absolute 0.4 0.1 - 1.0 K/uL   Eosinophils Relative 1 %   Eosinophils Absolute 0.1 0.0 - 0.5 K/uL   Basophils Relative 1 %   Basophils Absolute 0.1 0.0 - 0.1 K/uL   Immature Granulocytes 0 %   Abs Immature Granulocytes 0.02 0.00 - 0.07 K/uL    Comment: Performed at [facility], [address] [place], [place] [address]  Comprehensive metabolic panel     Status: Abnormal   Collection Time: 07/08/22  9:12 AM  Result Value Ref Range   Sodium 140 135 - 145 mmol/L   Potassium 4.3 3.5 - 5.1 mmol/L   Chloride 103 98 - 111 mmol/L   CO2 29 22 - 32 mmol/L   Glucose, Bld 84 70 - 99 mg/dL    Comment: Glucose reference range applies only to samples taken after fasting for at least 8 hours.   BUN 9 8 - 23 mg/dL   Creatinine, Ser 9.14 (H) 0.44 - 1.00 mg/dL   Calcium 9.4 8.9 - 78.2 mg/dL   Total Protein 6.3 (L) 6.5 - 8.1 g/dL   Albumin 4.0 3.5 - 5.0 g/dL   AST 16  15 - 41 U/L   ALT 14 0 - 44 U/L   Alkaline Phosphatase 56 38 - 126 U/L   Total Bilirubin 0.5 0.3 - 1.2 mg/dL   GFR, Estimated 57 (L) >60 mL/min    Comment: (NOTE) Calculated using the CKD-EPI Creatinine Equation (2021)    Anion gap 8 5 - 15    Comment: Performed at [facility], [address], [place], [place] [address]     Psychiatric Specialty Exam: Physical Exam  Review of Systems  Weight 149 lb (67.6 kg).There is no height or weight on file to calculate BMI.  General Appearance: NA  Eye Contact:  NA  Speech:  Normal Rate  Volume:  Normal  Mood:  Euthymic  Affect:  NA  Thought Process:  Goal Directed  Orientation:  Full (Time, Place, and Person)  Thought Content:  WDL  Suicidal Thoughts:  No  Homicidal Thoughts:  No  Memory:  Immediate;   Good Recent;   Good Remote;   Good  Judgement:  Good  Insight:  Present  Psychomotor Activity:  Normal  Concentration:  Concentration: Good and Attention Span: Good  Recall:  Good  Fund of Knowledge:  Good  Language:  Good  Akathisia:  No  Handed:  Right  AIMS (if indicated):     Assets:  Communication Skills Desire for Improvement Housing Resilience Social Support Talents/Skills Transportation  ADL's:  Intact  Cognition:  WNL  Sleep:  ok with trazodone     Assessment/Plan: Moderate episode of recurrent major depressive disorder - Plan: lamoTRIgine (LAMICTAL) 200 MG tablet, traZODone (DESYREL) 50 MG tablet, TRINTELLIX 20 MG TABS tablet  Attention deficit hyperactivity disorder (ADHD), predominantly inattentive type - Plan: methylphenidate (RITALIN) 5 MG tablet  I reviewed blood work results.  Discuss occasional difficulty in focus and attention.  Recommend to take extra Ritalin those days when she struggle with above symptoms.  We will provide 40 tablets of Ritalin.  We also discussed about keeping the Trintellix  because it did help her a lot.  Patient has a history of ECT that did not work.  I recommend if she  falls into doughnut hole and not able to afford the Trintellix then she can call us to get samples.  Patient has no rash or any itching.  Continue Lamictal 200 mg daily, Trintellix 20 mg daily, trazodone 50 mg at bedtime and she can take the Trintellix 5 mg in the morning and if needed another 5 mg at noon.  Discussed medication side effects and benefits.  Recommend to call us back if she has any question or any concern.  Follow-up in 3 months.   Follow Up Instructions:     I discussed the assessment and treatment plan with the patient. The patient was provided an opportunity to ask questions and all were answered. The patient agreed with the plan and demonstrated an understanding of the instructions.   The patient was advised to call back or seek an in-person evaluation if the symptoms worsen or if the condition fails to improve as anticipated.    Collaboration of Care: Other provider involved in patient's care AEB note are available in epic to review.  Patient/Guardian was advised Release of Information must be obtained prior to any record release in order to collaborate their care with an outside provider. Patient/Guardian was advised if they have not already done so to contact the registration department to sign all necessary forms in order for [name] to release information regarding their care.   Consent: Patient/Guardian gives verbal consent for treatment and assignment of benefits for services provided during this visit. Patient/Guardian expressed understanding and agreed to proceed.     I provided 26 minutes of non face to face time during this encounter.  Note: This document was prepared by [employer] voice dictation technology and any errors that results from this process are unintentional.    Cleotis Nipper, MD 08/19/2022

## 2022-08-30 ENCOUNTER — Other Ambulatory Visit (HOSPITAL_COMMUNITY): Payer: Self-pay | Admitting: Psychiatry

## 2022-08-30 DIAGNOSIS — F331 Major depressive disorder, recurrent, moderate: Secondary | ICD-10-CM

## 2022-09-16 ENCOUNTER — Telehealth (HOSPITAL_COMMUNITY): Payer: Self-pay | Admitting: *Deleted

## 2022-09-16 DIAGNOSIS — F900 Attention-deficit hyperactivity disorder, predominantly inattentive type: Secondary | ICD-10-CM

## 2022-09-16 MED ORDER — METHYLPHENIDATE HCL 5 MG PO TABS: ORAL_TABLET | ORAL | 0 refills | Status: DC

## 2022-09-16 NOTE — Telephone Encounter (Signed)
Done

## 2022-09-16 NOTE — Addendum Note (Signed)
Addended by: Kathryne Sharper T on: 09/16/2022 01:05 PM   Modules accepted: Orders

## 2022-09-16 NOTE — Telephone Encounter (Signed)
PATIENT REQUEST Rx REFILL--  methylphenidate (RITALIN) 5 MG tablet  Dose, Route, Frequency: As Directed  Dispense Quantity: 40 tablet Refills: 0        Sig: Take one tab in am and 2nd if needed at noon.       Start Date: 08/19/22 End Date: --  Written Date: 08/19/22 Expiration Date: 02/15/23  Earliest Fill Date: 08/19/22       Associated Diagnoses: Attention deficit hyperactivity disorder (ADHD), predominantly inattentive type [F90.0]  Original Order: methylphenidate (RITALIN) 5 MG tablet [[id number]]   [facility] [id number] [place], [place] - [address]  [address] [place], [place] [address]

## 2022-10-19 DIAGNOSIS — M7061 Trochanteric bursitis, right hip: Secondary | ICD-10-CM | POA: Diagnosis not present

## 2022-10-19 DIAGNOSIS — M222X1 Patellofemoral disorders, right knee: Secondary | ICD-10-CM | POA: Diagnosis not present

## 2022-10-22 ENCOUNTER — Telehealth (HOSPITAL_COMMUNITY): Payer: Self-pay

## 2022-10-22 DIAGNOSIS — F900 Attention-deficit hyperactivity disorder, predominantly inattentive type: Secondary | ICD-10-CM

## 2022-10-22 MED ORDER — METHYLPHENIDATE HCL 5 MG PO TABS: ORAL_TABLET | ORAL | 0 refills | Status: DC

## 2022-10-22 NOTE — Telephone Encounter (Signed)
Done

## 2022-10-22 NOTE — Telephone Encounter (Signed)
Patient is calling for a refill on her Adderall 5 mg, [facility] is [facility] [place], patient was last seen 4/17 and has a follow up scheduled on 7/17. Please review and advise, thank you

## 2022-11-13 ENCOUNTER — Other Ambulatory Visit (HOSPITAL_COMMUNITY): Payer: Self-pay | Admitting: Psychiatry

## 2022-11-13 DIAGNOSIS — F331 Major depressive disorder, recurrent, moderate: Secondary | ICD-10-CM

## 2022-11-18 ENCOUNTER — Telehealth (HOSPITAL_BASED_OUTPATIENT_CLINIC_OR_DEPARTMENT_OTHER): Payer: Medicare Other | Admitting: Psychiatry

## 2022-11-18 ENCOUNTER — Encounter (HOSPITAL_COMMUNITY): Payer: Self-pay | Admitting: Psychiatry

## 2022-11-18 ENCOUNTER — Ambulatory Visit
Admission: RE | Admit: 2022-11-18 | Discharge: 2022-11-18 | Disposition: A | Discharge status: Home / Self Care | Payer: Medicare Other | Source: Ambulatory Visit | Attending: Family Medicine | Admitting: Family Medicine

## 2022-11-18 VITALS — Wt 149.0 lb

## 2022-11-18 DIAGNOSIS — Z1231 Encounter for screening mammogram for malignant neoplasm of breast: Secondary | ICD-10-CM | POA: Diagnosis present

## 2022-11-18 DIAGNOSIS — F900 Attention-deficit hyperactivity disorder, predominantly inattentive type: Secondary | ICD-10-CM

## 2022-11-18 DIAGNOSIS — F331 Major depressive disorder, recurrent, moderate: Secondary | ICD-10-CM | POA: Diagnosis not present

## 2022-11-18 MED ORDER — METHYLPHENIDATE HCL 5 MG PO TABS: ORAL_TABLET | ORAL | 0 refills | Status: DC

## 2022-11-18 MED ORDER — TRAZODONE HCL 50 MG PO TABS
50.0000 mg | ORAL_TABLET | Freq: Every evening | ORAL | 0 refills | Status: DC | PRN
Start: 2022-11-18 — End: 2023-02-17

## 2022-11-18 MED ORDER — TRINTELLIX 20 MG PO TABS
20.0000 mg | ORAL_TABLET | ORAL | 2 refills | Status: DC
Start: 2022-11-18 — End: 2023-02-17

## 2022-11-18 MED ORDER — LAMOTRIGINE 200 MG PO TABS
200.0000 mg | ORAL_TABLET | ORAL | 0 refills | Status: DC
Start: 2022-11-18 — End: 2023-02-17

## 2022-11-18 NOTE — Progress Notes (Addendum)
[facility] MD Virtual Progress Note   Patient Location: Home Provider Location: Home Office  I connect with patient by telephone and verified that I am speaking with correct person by using two identifiers. I discussed the limitations of evaluation and management by telemedicine and the availability of in person appointments. I also discussed with the patient that there may be a patient responsible charge related to this service. The patient expressed understanding and agreed to proceed.  [name] [id number] 77 y.o.  11/18/2022 8:37 AM  History of Present Illness:  Patient is evaluated by phone session.  She reported for past few weeks feeling tired.  She reported husband has a backache and not doing very well.  They are planning to travel to [place] but due to back pain of the husband they have decided to stay.  She also reported due to extreme hot weather not able to go outside and sometimes feels dehydrated.  She cut down her Ritalin and only taking 1 tablet in the morning because when she tried second pill at noon she have difficulty sleeping.  She denies any feeling of hopelessness or worthlessness.  She is able to get the Trintellix and has no difficulty.  Prescription.  She has no tremors, shakes or any EPS.  She takes the Lamictal and denies any rash, itching.  She reported may have gained a few pounds because not as active but like to go back to her normal activity once is little bit cool off.  She denies any suicidal thoughts.  She is taking trazodone and also using CPAP.  She wants to keep the current medication.    Past Psychiatric History: H/O mania, irritability, impulsive behavior and overdose at age 52. H/O inpatient twice. Had psychological testing and diagnosed ADD. Took Concerta, (Adderall, Vyvanse made anxious) Wellbutrin, Prozac, lithium, Effexor, Lexapro, Abilify and Cymbalta.  Best when given female hormone to help endometriosis.  Had ECT, did not  work. TMS helped. No history of psychosis.     Outpatient Encounter Medications as of 11/18/2022  Medication Sig   calcium-vitamin D (OSCAL WITH D) 500-200 MG-UNIT tablet Take 2 tablets by mouth 2 (two) times daily.   cetirizine (ZYRTEC) 10 MG tablet Take 10 mg by mouth daily as needed for allergies.    estradiol (ESTRACE) 0.5 MG tablet Take 1 tablet (0.5 mg total) by mouth daily.   lamoTRIgine (LAMICTAL) 200 MG tablet Take 1 tablet (200 mg total) by mouth every morning.   levothyroxine (SYNTHROID) 25 MCG tablet Take 1 tablet (25 mcg total) by mouth daily before breakfast. 30 minutes   methylphenidate (RITALIN) 5 MG tablet Take one tab in am and 2nd if needed at noon.   Multiple Vitamins-Minerals (MULTIVITAMIN WITH MINERALS) tablet Take 1 tablet by mouth daily.   omeprazole (PRILOSEC) 20 MG capsule TAKE ONE CAPSULE BY MOUTH DAILY 30 MINUTES PRIOR TO LUNCH. MUST WAIT 3-4 HOURS AFTER THYROID PILL   ondansetron (ZOFRAN-ODT) 4 MG disintegrating tablet Take 1 tablet (4 mg total) by mouth 2 (two) times daily as needed for nausea or vomiting. AND PRN if this continues see gastroenterology   Probiotic Product (PROBIOTIC PO) Take by mouth.   traZODone (DESYREL) 50 MG tablet Take 1 tablet (50 mg total) by mouth at bedtime as needed for sleep.   TRINTELLIX 20 MG TABS tablet Take 1 tablet (20 mg total) by mouth every morning.   No facility-administered encounter medications on file as of 11/18/2022.    No results found for  this or any previous visit (from the past 2160 hour(s)).   Psychiatric Specialty Exam: Physical Exam  Review of Systems  Weight 149 lb (67.6 kg).There is no height or weight on file to calculate BMI.  General Appearance: NA  Eye Contact:  NA  Speech:  Clear and Coherent and Normal Rate  Volume:  Normal  Mood:  Euthymic  Affect:  NA  Thought Process:  Goal Directed  Orientation:  Full (Time, Place, and Person)  Thought Content:  WDL  Suicidal Thoughts:  No  Homicidal  Thoughts:  No  Memory:  Immediate;   Good Recent;   Good Remote;   Good  Judgement:  Good  Insight:  Good  Psychomotor Activity:  Normal  Concentration:  Concentration: Good and Attention Span: Good  Recall:  Good  Fund of Knowledge:  Good  Language:  Good  Akathisia:  No  Handed:  Right  AIMS (if indicated):     Assets:  Communication Skills Desire for Improvement Housing Resilience Social Support Talents/Skills Transportation  ADL's:  Intact  Cognition:  WNL  Sleep:  ok  with cpap     Assessment/Plan: Moderate episode of recurrent major depressive disorder (HCC) - Plan: traZODone (DESYREL) 50 MG tablet, TRINTELLIX 20 MG TABS tablet, lamoTRIgine (LAMICTAL) 200 MG tablet  Attention deficit hyperactivity disorder (ADHD), predominantly inattentive type - Plan: methylphenidate (RITALIN) 5 MG tablet  Patient only taking Ritalin once a day as second pill makes her more anxious and could not sleep.  So far 5 mg Ritalin working all day.  Discussed fatigue which could be due to weather related and not hydrating herself.  So far no major concern from the medication.  Continue Trintellix 20 mg daily, trazodone 50 mg at bedtime, Lamictal 200 mg daily and Ritalin 5 mg in the morning.  Recommend to call us back if she is any question or any concern.  Follow-up in 3 months   Follow Up Instructions:     I discussed the assessment and treatment plan with the patient. The patient was provided an opportunity to ask questions and all were answered. The patient agreed with the plan and demonstrated an understanding of the instructions.   The patient was advised to call back or seek an in-person evaluation if the symptoms worsen or if the condition fails to improve as anticipated.    Collaboration of Care: Other provider involved in patient's care AEB notes are available in epic to review.  Patient/Guardian was advised Release of Information must be obtained prior to any record release in  order to collaborate their care with an outside provider. Patient/Guardian was advised if they have not already done so to contact the registration department to sign all necessary forms in order for [name] to release information regarding their care.   Consent: Patient/Guardian gives verbal consent for treatment and assignment of benefits for services provided during this visit. Patient/Guardian expressed understanding and agreed to proceed.    I provided 18 minutes of non face-to-face time with the patient.  50% of the time spent in psychoeducation and reviewing the chart.  Note: This document was prepared by [employer] voice dictation technology and any errors that results from this process are unintentional.    Cleotis Nipper, MD 11/18/2022

## 2022-11-26 DIAGNOSIS — M25361 Other instability, right knee: Secondary | ICD-10-CM | POA: Diagnosis not present

## 2022-12-04 DIAGNOSIS — M23341 Other meniscus derangements, anterior horn of lateral meniscus, right knee: Secondary | ICD-10-CM | POA: Diagnosis not present

## 2022-12-04 DIAGNOSIS — M25361 Other instability, right knee: Secondary | ICD-10-CM | POA: Diagnosis not present

## 2022-12-04 DIAGNOSIS — M23361 Other meniscus derangements, other lateral meniscus, right knee: Secondary | ICD-10-CM | POA: Diagnosis not present

## 2022-12-17 ENCOUNTER — Encounter (INDEPENDENT_AMBULATORY_CARE_PROVIDER_SITE_OTHER): Payer: Self-pay

## 2022-12-21 DIAGNOSIS — M1711 Unilateral primary osteoarthritis, right knee: Secondary | ICD-10-CM | POA: Diagnosis not present

## 2022-12-22 ENCOUNTER — Telehealth (HOSPITAL_COMMUNITY): Payer: Self-pay | Admitting: *Deleted

## 2022-12-22 DIAGNOSIS — F900 Attention-deficit hyperactivity disorder, predominantly inattentive type: Secondary | ICD-10-CM

## 2022-12-22 MED ORDER — METHYLPHENIDATE HCL 5 MG PO TABS
ORAL_TABLET | ORAL | 0 refills | Status: DC
Start: 2022-12-22 — End: 2023-01-21

## 2022-12-22 NOTE — Telephone Encounter (Signed)
Done

## 2022-12-22 NOTE — Telephone Encounter (Signed)
Pt called requesting a refill of the Methylphenidate 5 mg every day. Last ordered on 11/18/22 during last appointment. Pt has f/u scheduled for 02/17/23. Please review.

## 2023-01-19 ENCOUNTER — Other Ambulatory Visit: Payer: Medicare Other

## 2023-01-20 ENCOUNTER — Other Ambulatory Visit (INDEPENDENT_AMBULATORY_CARE_PROVIDER_SITE_OTHER): Payer: Medicare Other

## 2023-01-20 ENCOUNTER — Encounter: Payer: Self-pay | Admitting: Family Medicine

## 2023-01-20 DIAGNOSIS — E785 Hyperlipidemia, unspecified: Secondary | ICD-10-CM | POA: Diagnosis not present

## 2023-01-20 DIAGNOSIS — E039 Hypothyroidism, unspecified: Secondary | ICD-10-CM | POA: Diagnosis not present

## 2023-01-20 DIAGNOSIS — R7309 Other abnormal glucose: Secondary | ICD-10-CM

## 2023-01-20 LAB — TSH: TSH: 1.92 u[IU]/mL (ref 0.35–5.50)

## 2023-01-20 LAB — LIPID PANEL
Cholesterol: 193 mg/dL (ref 0–200)
HDL: 70 mg/dL (ref >39.00)
LDL Cholesterol: 99 mg/dL (ref 0–99)
NonHDL: 122.75
Total CHOL/HDL Ratio: 3
Triglycerides: 120 mg/dL (ref 0.0–149.0)
VLDL: 24 mg/dL (ref 0.0–40.0)

## 2023-01-20 LAB — HEMOGLOBIN A1C: Hgb A1c MFr Bld: 5.3 % (ref 4.6–6.5)

## 2023-01-21 ENCOUNTER — Telehealth (HOSPITAL_COMMUNITY): Payer: Self-pay | Admitting: *Deleted

## 2023-01-21 DIAGNOSIS — F900 Attention-deficit hyperactivity disorder, predominantly inattentive type: Secondary | ICD-10-CM

## 2023-01-21 MED ORDER — METHYLPHENIDATE HCL 5 MG PO TABS
ORAL_TABLET | ORAL | 0 refills | Status: DC
Start: 2023-01-21 — End: 2023-02-17

## 2023-01-21 NOTE — Telephone Encounter (Signed)
Pt called requesting a refill of the Ritalin 5 mg every day. Last visit 11/18/22 and has f/u scheduled for 02/17/23. Medication las te-scribed on 12/22/22. [employer]. [place]. Please review.

## 2023-01-21 NOTE — Telephone Encounter (Signed)
Done

## 2023-01-26 ENCOUNTER — Encounter: Payer: Self-pay | Admitting: Family Medicine

## 2023-01-26 ENCOUNTER — Ambulatory Visit: Payer: Medicare Other | Admitting: Family Medicine

## 2023-01-26 VITALS — BP 122/64 | HR 74 | Temp 98.0°F | Resp 16 | Ht 62.0 in | Wt 154.4 lb

## 2023-01-26 DIAGNOSIS — Z23 Encounter for immunization: Secondary | ICD-10-CM | POA: Diagnosis not present

## 2023-01-26 DIAGNOSIS — N959 Unspecified menopausal and perimenopausal disorder: Secondary | ICD-10-CM | POA: Diagnosis not present

## 2023-01-26 DIAGNOSIS — Z0000 Encounter for general adult medical examination without abnormal findings: Secondary | ICD-10-CM | POA: Diagnosis not present

## 2023-01-26 DIAGNOSIS — E039 Hypothyroidism, unspecified: Secondary | ICD-10-CM | POA: Diagnosis not present

## 2023-01-26 DIAGNOSIS — Z1231 Encounter for screening mammogram for malignant neoplasm of breast: Secondary | ICD-10-CM

## 2023-01-26 MED ORDER — LEVOTHYROXINE SODIUM 25 MCG PO TABS
25.0000 ug | ORAL_TABLET | Freq: Every day | ORAL | 3 refills | Status: DC
Start: 2023-01-26 — End: 2023-11-11

## 2023-01-26 MED ORDER — ESTRADIOL 0.5 MG PO TABS
0.5000 mg | ORAL_TABLET | Freq: Every day | ORAL | 3 refills | Status: DC
Start: 2023-01-26 — End: 2023-11-11

## 2023-01-26 NOTE — Assessment & Plan Note (Signed)
HCM Medicare annual wellness completed DEXA scan completed 11/2021.   Mammogram up-to-date.  Due 7/25.  Order placed Tetanus up-to-date Shingles vaccine and pneumonia vaccine completed. Colon cancer screening aged out Flu vaccine today

## 2023-01-26 NOTE — Progress Notes (Signed)
SUBJECTIVE:   Chief Complaint  Patient presents with   Annual Exam   HPI Patient presents to clinic for annual physical  No acute concerns.  Mood disorder Continues to doing well on current medication. Follows with Dr. Lolly Mustache, psychiatry.  No changes to current medication. lamotrigine 200 mg daily, methylphenidate 5 mg q. afternoon, Trintellix 20 mg daily and trazodone 50 mg nightly.  Denies any SI/HI.  HRT use Requesting refill Estrace.  Long term use. No history of MI, VTE or hypertension.  Hypothyroid Asymptomatic.  Compliant with levothyroxine 25 mcg daily.  Tolerating well.  Requesting refill.    PERTINENT PMH / PSH: Hypothyroid Mood disorder History of colon cancer.  No chemo.  Status post resection 2018.  Colonoscopy History of Barrett's esophagus.  EGD due 08/2023 History of MUGUS Hyperlipidemia Fibromyalgia  OBJECTIVE:  BP 122/64   Pulse 74   Temp 98 F (36.7 C)   Resp 16   Ht 5\' 2"  (1.575 m)   Wt 154 lb 6 oz (70 kg)   LMP  (LMP Unknown)   SpO2 98%   BMI 28.24 kg/m    Physical Exam Vitals reviewed.  Constitutional:      General: She is not in acute distress.    Appearance: Normal appearance. She is not ill-appearing.  HENT:     Head: Normocephalic.     Right Ear: Tympanic membrane, ear canal and external ear normal.     Left Ear: Tympanic membrane, ear canal and external ear normal.     Nose: Nose normal.  Eyes:     Conjunctiva/sclera: Conjunctivae normal.  Neck:     Thyroid: No thyromegaly or thyroid tenderness.  Cardiovascular:     Rate and Rhythm: Normal rate and regular rhythm.     Heart sounds: Normal heart sounds.  Pulmonary:     Effort: Pulmonary effort is normal.     Breath sounds: Normal breath sounds.  Abdominal:     General: Abdomen is flat. Bowel sounds are normal.     Palpations: Abdomen is soft.  Musculoskeletal:        General: Normal range of motion.     Cervical back: Normal range of motion.  Neurological:     Mental  Status: She is alert and oriented to person, place, and time. Mental status is at baseline.  Psychiatric:        Mood and Affect: Mood normal.        Behavior: Behavior normal.        Thought Content: Thought content normal.        Judgment: Judgment normal.     ASSESSMENT/PLAN:  Annual physical exam Assessment & Plan: HCM Medicare annual wellness completed DEXA scan completed 11/2021.   Mammogram up-to-date.  Due 7/25.  Order placed Tetanus up-to-date Shingles vaccine and pneumonia vaccine completed. Colon cancer screening aged out Flu vaccine today    Hypothyroidism, unspecified type -     Levothyroxine Sodium; Take 1 tablet (25 mcg total) by mouth daily before breakfast. 30 minutes  Dispense: 90 tablet; Refill: 3  Need for influenza vaccination -     Flu Vaccine Trivalent High Dose (Fluad)  Unspecified menopausal and perimenopausal disorder -     Estradiol; Take 1 tablet (0.5 mg total) by mouth daily.  Dispense: 90 tablet; Refill: 3  Breast cancer screening by mammogram -     3D Screening Mammogram, Left and Right; Future      PDMP reviewed  Return if symptoms worsen or fail  to improve, for PCP.  Dana Allan, MD

## 2023-01-26 NOTE — Patient Instructions (Addendum)
It was a pleasure meeting you today. Thank you for allowing me to take part in your health care.  Our goals for today as we discussed include:  Refill sent for requested medications  Flu vaccine today  Referral sent for Mammogram and Dexa scan. Please call to schedule appointment. [facility] [address] [place], [place] [address] [phone]    This is a list of the screening recommended for you and due dates:  Health Maintenance  Topic Date Due   Flu Shot  12/03/2022   COVID-19 Vaccine (5 - 2023-24 season) 01/03/2023   Medicare Annual Wellness Visit  08/11/2023   Mammogram  11/18/2023   DTaP/Tdap/Td vaccine (4 - Td or Tdap) 09/06/2028   Pneumonia Vaccine  Completed   DEXA scan (bone density measurement)  Completed   Hepatitis C Screening  Completed   Zoster (Shingles) Vaccine  Completed   HPV Vaccine  Aged Out   Colon Cancer Screening  Discontinued     Follow up as needed  Annual visit in 1 year  If you have any questions or concerns, please do not hesitate to call the office at [phone].  I look forward to our next visit and until then take care and stay safe.  Regards,   Dana Allan, MD   [facility]

## 2023-02-03 ENCOUNTER — Encounter: Payer: Self-pay | Admitting: Family Medicine

## 2023-02-03 DIAGNOSIS — Z1231 Encounter for screening mammogram for malignant neoplasm of breast: Secondary | ICD-10-CM | POA: Insufficient documentation

## 2023-02-03 DIAGNOSIS — Z23 Encounter for immunization: Secondary | ICD-10-CM | POA: Insufficient documentation

## 2023-02-12 ENCOUNTER — Other Ambulatory Visit (HOSPITAL_COMMUNITY): Payer: Self-pay | Admitting: Psychiatry

## 2023-02-12 DIAGNOSIS — Z961 Presence of intraocular lens: Secondary | ICD-10-CM | POA: Diagnosis not present

## 2023-02-12 DIAGNOSIS — F331 Major depressive disorder, recurrent, moderate: Secondary | ICD-10-CM

## 2023-02-17 ENCOUNTER — Telehealth (HOSPITAL_BASED_OUTPATIENT_CLINIC_OR_DEPARTMENT_OTHER): Payer: Medicare Other | Admitting: Psychiatry

## 2023-02-17 ENCOUNTER — Encounter (HOSPITAL_COMMUNITY): Payer: Self-pay | Admitting: Psychiatry

## 2023-02-17 VITALS — Wt 154.0 lb

## 2023-02-17 DIAGNOSIS — F900 Attention-deficit hyperactivity disorder, predominantly inattentive type: Secondary | ICD-10-CM

## 2023-02-17 DIAGNOSIS — F331 Major depressive disorder, recurrent, moderate: Secondary | ICD-10-CM | POA: Diagnosis not present

## 2023-02-17 MED ORDER — LAMOTRIGINE 150 MG PO TABS
150.0000 mg | ORAL_TABLET | ORAL | 2 refills | Status: DC
Start: 2023-02-17 — End: 2023-04-14

## 2023-02-17 MED ORDER — METHYLPHENIDATE HCL 5 MG PO TABS
ORAL_TABLET | ORAL | 0 refills | Status: DC
Start: 2023-02-17 — End: 2023-03-24

## 2023-02-17 MED ORDER — TRINTELLIX 20 MG PO TABS
20.0000 mg | ORAL_TABLET | ORAL | 2 refills | Status: DC
Start: 2023-02-17 — End: 2023-05-20

## 2023-02-17 MED ORDER — TRAZODONE HCL 50 MG PO TABS: 50.0000 mg | ORAL_TABLET | Freq: Every evening | ORAL | 0 refills | Status: DC | PRN

## 2023-02-17 NOTE — Progress Notes (Signed)
[facility] MD Virtual Progress Note   Patient Location: Home Provider Location: Home Office  I connect with patient by video and verified that I am speaking with correct person by using two identifiers. I discussed the limitations of evaluation and management by telemedicine and the availability of in person appointments. I also discussed with the patient that there may be a patient responsible charge related to this service. The patient expressed understanding and agreed to proceed.  [name] [id number] 77 y.o.  02/17/2023 9:49 AM  History of Present Illness:  Patient is evaluated by video session.  She reported still feels tired and fatigued in the afternoon but otherwise no major concerns or issues.  She is pleased that her husband's back pain is much better and he is seeing pain management.  Her attention, concentration is good.  She is able to do multitasking.  She denies any crying spells or any feeling of hopelessness or worthlessness.  She has no tremor or shakes or any EPS.  She is taking Trintellix which is helping her depression and anxiety.  She also taking Lamictal and reported no rash.  She uses CPAP and trazodone that is helping her sleep.  Recently she had a visit with primary care and her labs are okay.  No new medication added.  She is taking Ritalin 5 mg in the morning.  She used to take 2 times a day but started to have a lot of anxiety and nervousness and decided to cut down to 1 pill a day.  She admitted few pounds weight gain as not active in the afternoon.  Past Psychiatric History: H/O mania, irritability, impulsive behavior and overdose at age 57. H/O inpatient twice. Had psychological testing and diagnosed ADD. Took Concerta, (Adderall, Vyvanse made anxious) Wellbutrin, Prozac, lithium, Effexor, Lexapro, Abilify and Cymbalta.  Best when given female hormone to help endometriosis.  Had ECT, did not work. TMS helped. No history of psychosis.      Outpatient Encounter Medications as of 02/17/2023  Medication Sig   calcium-vitamin D (OSCAL WITH D) 500-200 MG-UNIT tablet Take 2 tablets by mouth 2 (two) times daily.   cetirizine (ZYRTEC) 10 MG tablet Take 10 mg by mouth daily as needed for allergies.    estradiol (ESTRACE) 0.5 MG tablet Take 1 tablet (0.5 mg total) by mouth daily.   lamoTRIgine (LAMICTAL) 200 MG tablet Take 1 tablet (200 mg total) by mouth every morning.   levothyroxine (SYNTHROID) 25 MCG tablet Take 1 tablet (25 mcg total) by mouth daily before breakfast. 30 minutes   methylphenidate (RITALIN) 5 MG tablet Take one tab in am   Multiple Vitamins-Minerals (MULTIVITAMIN WITH MINERALS) tablet Take 1 tablet by mouth daily.   omeprazole (PRILOSEC) 20 MG capsule TAKE ONE CAPSULE BY MOUTH DAILY 30 MINUTES PRIOR TO LUNCH. MUST WAIT 3-4 HOURS AFTER THYROID PILL   ondansetron (ZOFRAN-ODT) 4 MG disintegrating tablet Take 1 tablet (4 mg total) by mouth 2 (two) times daily as needed for nausea or vomiting. AND PRN if this continues see gastroenterology   Probiotic Product (PROBIOTIC PO) Take by mouth.   traZODone (DESYREL) 50 MG tablet Take 1 tablet (50 mg total) by mouth at bedtime as needed for sleep.   TRINTELLIX 20 MG TABS tablet Take 1 tablet (20 mg total) by mouth every morning.   No facility-administered encounter medications on file as of 02/17/2023.    Recent Results (from the past 2160 hour(s))  Hemoglobin A1c     Status: None  Collection Time: 01/20/23 10:03 AM  Result Value Ref Range   Hgb A1c MFr Bld 5.3 4.6 - 6.5 %    Comment: Glycemic Control Guidelines for People with Diabetes:Non Diabetic:  <6%Goal of Therapy: <7%Additional Action Suggested:  >8%   TSH     Status: None   Collection Time: 01/20/23 10:03 AM  Result Value Ref Range   TSH 1.92 0.35 - 5.50 uIU/mL  Lipid panel     Status: None   Collection Time: 01/20/23 10:03 AM  Result Value Ref Range   Cholesterol 193 0 - 200 mg/dL    Comment: ATP III  Classification       Desirable:  < 200 mg/dL               Borderline High:  200 - 239 mg/dL          High:  > = 259 mg/dL   Triglycerides 563.8 0.0 - 149.0 mg/dL    Comment: Normal:  <756 mg/dLBorderline High:  150 - 199 mg/dL   HDL 43.32 >95.18 mg/dL   VLDL 84.1 0.0 - 66.0 mg/dL   LDL Cholesterol 99 0 - 99 mg/dL   Total CHOL/HDL Ratio 3     Comment:                Men          Women1/2 Average Risk     3.4          3.3Average Risk          5.0          4.42X Average Risk          9.6          7.13X Average Risk          15.0          11.0                       NonHDL 122.75     Comment: NOTE:  Non-HDL goal should be 30 mg/dL higher than patient's LDL goal (i.e. LDL goal of < 70 mg/dL, would have non-HDL goal of < 100 mg/dL)     Psychiatric Specialty Exam: Physical Exam  Review of Systems  Constitutional:  Positive for fatigue.    Weight 154 lb (69.9 kg).There is no height or weight on file to calculate BMI.  General Appearance: Casual  Eye Contact:  Good  Speech:  Clear and Coherent and Slow  Volume:  Normal  Mood:  Euthymic  Affect:  Appropriate  Thought Process:  Goal Directed  Orientation:  Full (Time, Place, and Person)  Thought Content:  WDL  Suicidal Thoughts:  No  Homicidal Thoughts:  No  Memory:  Immediate;   Good Recent;   Good Remote;   Good  Judgement:  Intact  Insight:  Present  Psychomotor Activity:  Normal  Concentration:  Concentration: Good and Attention Span: Good  Recall:  Good  Fund of Knowledge:  Good  Language:  Good  Akathisia:  No  Handed:  Right  AIMS (if indicated):     Assets:  Communication Skills Desire for Improvement Financial Resources/Insurance Housing Resilience Social Support Transportation  ADL's:  Intact  Cognition:  WNL  Sleep:  ok with CPAP     Assessment/Plan: Moderate episode of recurrent major depressive disorder (HCC) - Plan: lamoTRIgine (LAMICTAL) 150 MG tablet, traZODone (DESYREL) 50 MG tablet, TRINTELLIX 20 MG TABS  tablet  Attention deficit  hyperactivity disorder (ADHD), predominantly inattentive type - Plan: methylphenidate (RITALIN) 5 MG tablet  I reviewed blood work results and current medication.  Labs stable.  I recommend to try reducing the Lamictal from 200 mg a day to 150 mg a day.  That may help her fatigue.  She like to try again Ritalin in the afternoon however she had noticed increased anxiety and nervousness.  She agreed to try first the dose of Lamictal to reduce however if she noticed worsening of depression then she will call us back.  We will follow-up in 3 months.  I have provided 30-day prescription of Lamictal 150 with 2 additional refills.  Recommended to call us back if she has any question or any concern.   Follow Up Instructions:     I discussed the assessment and treatment plan with the patient. The patient was provided an opportunity to ask questions and all were answered. The patient agreed with the plan and demonstrated an understanding of the instructions.   The patient was advised to call back or seek an in-person evaluation if the symptoms worsen or if the condition fails to improve as anticipated.    Collaboration of Care: Other provider involved in patient's care AEB notes are available in epic to review  Patient/Guardian was advised Release of Information must be obtained prior to any record release in order to collaborate their care with an outside provider. Patient/Guardian was advised if they have not already done so to contact the registration department to sign all necessary forms in order for [name] to release information regarding their care.   Consent: Patient/Guardian gives verbal consent for treatment and assignment of benefits for services provided during this visit. Patient/Guardian expressed understanding and agreed to proceed.     I provided 15 minutes of non face to face time during this encounter.  Note: This document was prepared by [employer] voice dictation  technology and any errors that results from this process are unintentional.    Cleotis Nipper, MD 02/17/2023

## 2023-03-09 ENCOUNTER — Encounter (HOSPITAL_COMMUNITY): Payer: Self-pay

## 2023-03-24 ENCOUNTER — Telehealth (HOSPITAL_COMMUNITY): Payer: Self-pay | Admitting: *Deleted

## 2023-03-24 DIAGNOSIS — F900 Attention-deficit hyperactivity disorder, predominantly inattentive type: Secondary | ICD-10-CM

## 2023-03-24 MED ORDER — METHYLPHENIDATE HCL 5 MG PO TABS: ORAL_TABLET | ORAL | 0 refills | Status: DC

## 2023-03-24 NOTE — Telephone Encounter (Signed)
Pt called requesting a refill of the generic Ritalin 5 mg qam. Last e-scribed on 02/17/23, which was her most recent visit. Pt has f/u scheduled for 05/20/23. Please review.

## 2023-03-24 NOTE — Telephone Encounter (Signed)
Done

## 2023-04-13 ENCOUNTER — Telehealth (HOSPITAL_COMMUNITY): Payer: Self-pay | Admitting: *Deleted

## 2023-04-13 DIAGNOSIS — F331 Major depressive disorder, recurrent, moderate: Secondary | ICD-10-CM

## 2023-04-13 NOTE — Telephone Encounter (Signed)
Pt called requesting to increase lamictal back up to 200 mg every day (pt currently on 150 mg every day). The dose was decreased as of last visit on 02/17/23 due to lack of energy however pt says she really does need to 200 mg every day. She says the 150 mg is not working and she actually has been cutting pills in 1/4's and taking total 200 mg daily so now needs refill of the 200 mg. Pt next appointment is scheduled for 05/20/23. Please review and advise.

## 2023-04-14 MED ORDER — LAMOTRIGINE 200 MG PO TABS: 200.0000 mg | ORAL_TABLET | ORAL | 1 refills | Status: DC

## 2023-04-14 NOTE — Telephone Encounter (Signed)
I sent a new prescription of Lamictal 200 mg daily with 1 additional refill to last until next appointment.

## 2023-04-20 ENCOUNTER — Other Ambulatory Visit: Payer: Self-pay

## 2023-04-20 DIAGNOSIS — R112 Nausea with vomiting, unspecified: Secondary | ICD-10-CM

## 2023-04-21 ENCOUNTER — Telehealth: Payer: Self-pay

## 2023-04-21 ENCOUNTER — Telehealth (HOSPITAL_COMMUNITY): Payer: Self-pay | Admitting: *Deleted

## 2023-04-21 DIAGNOSIS — F900 Attention-deficit hyperactivity disorder, predominantly inattentive type: Secondary | ICD-10-CM

## 2023-04-21 MED ORDER — METHYLPHENIDATE HCL 5 MG PO TABS: ORAL_TABLET | ORAL | 0 refills | Status: DC

## 2023-04-21 NOTE — Telephone Encounter (Signed)
Done

## 2023-04-21 NOTE — Telephone Encounter (Signed)
Copied from CRM [id number]. Topic: Clinical - Medication Refill >> Apr 21, 2023  8:28 AM Almira Coaster wrote: Most Recent Primary Care Visit:  Provider: Dana Allan  Department: [facility]  Visit Type: OFFICE VISIT  Date: 01/26/2023  Medication: ondansetron (ZOFRAN-ODT) 4 MG disintegrating tablet  Has the patient contacted their pharmacy? Yes (Agent: If no, request that the patient contact the pharmacy for the refill. If patient does not wish to contact the pharmacy document the reason why and proceed with request.) (Agent: If yes, when and what did the pharmacy advise?)  Is this the correct pharmacy for this prescription? Yes If no, delete pharmacy and type the correct one.  This is the patient's preferred pharmacy:  [facility] [id number] [place], [place] - [address] [place] [address] Phone: [phone] Fax: [phone]   Has the prescription been filled recently? No  Is the patient out of the medication? No  Has the patient been seen for an appointment in the last year OR does the patient have an upcoming appointment? Yes  Can we respond through MyChart? Yes  Agent: Please be advised that Rx refills may take up to 3 business days. We ask that you follow-up with your pharmacy. >> Apr 21, 2023  8:46 AM CMA Elijio Miles wrote: Refilled: 01/23/2022 by Dr. French Ana Last OV: 01/26/2023 Next OV: 01/28/2024

## 2023-04-21 NOTE — Telephone Encounter (Signed)
Pt called requesting refill of the generic Ritalin 5 mg every day. Pt was last seen on 02/17/23 and has f/u scheduled on 05/20/23. Medication was last e-scribed on 03/24/23. Please review.

## 2023-04-23 ENCOUNTER — Telehealth: Payer: Self-pay

## 2023-04-23 ENCOUNTER — Other Ambulatory Visit: Payer: Self-pay

## 2023-04-23 DIAGNOSIS — R112 Nausea with vomiting, unspecified: Secondary | ICD-10-CM

## 2023-04-23 MED ORDER — ONDANSETRON 4 MG PO TBDP: 4.0000 mg | ORAL_TABLET | Freq: Two times a day (BID) | ORAL | 3 refills | Status: DC | PRN

## 2023-04-23 NOTE — Telephone Encounter (Signed)
Rx sent to pharmacy and pr notified.

## 2023-04-23 NOTE — Telephone Encounter (Signed)
Copied from CRM [id number]. Topic: Clinical - Medication Question >> Apr 23, 2023  3:57 PM Almira Coaster wrote: Reason for CRM: Patient is calling to follow up on her request for ondansetron (ZOFRAN-ODT) 4 MG disintegrating tablet, she states that she just needs a yes or no answer if it will be filled if not she will look for a different provider.

## 2023-05-07 ENCOUNTER — Other Ambulatory Visit: Payer: Self-pay | Admitting: Family

## 2023-05-07 DIAGNOSIS — K219 Gastro-esophageal reflux disease without esophagitis: Secondary | ICD-10-CM

## 2023-05-11 ENCOUNTER — Other Ambulatory Visit: Payer: Self-pay

## 2023-05-11 DIAGNOSIS — K219 Gastro-esophageal reflux disease without esophagitis: Secondary | ICD-10-CM

## 2023-05-11 MED ORDER — OMEPRAZOLE 20 MG PO CPDR: DELAYED_RELEASE_CAPSULE | ORAL | 3 refills | Status: DC

## 2023-05-15 ENCOUNTER — Other Ambulatory Visit (HOSPITAL_COMMUNITY): Payer: Self-pay | Admitting: Psychiatry

## 2023-05-15 DIAGNOSIS — F331 Major depressive disorder, recurrent, moderate: Secondary | ICD-10-CM

## 2023-05-20 ENCOUNTER — Encounter (HOSPITAL_COMMUNITY): Payer: Self-pay | Admitting: Psychiatry

## 2023-05-20 ENCOUNTER — Telehealth (HOSPITAL_COMMUNITY): Payer: Medicare Other | Admitting: Psychiatry

## 2023-05-20 VITALS — Wt 154.0 lb

## 2023-05-20 DIAGNOSIS — F900 Attention-deficit hyperactivity disorder, predominantly inattentive type: Secondary | ICD-10-CM

## 2023-05-20 DIAGNOSIS — F331 Major depressive disorder, recurrent, moderate: Secondary | ICD-10-CM

## 2023-05-20 MED ORDER — TRAZODONE HCL 50 MG PO TABS: 50.0000 mg | ORAL_TABLET | Freq: Every evening | ORAL | 0 refills | Status: DC | PRN

## 2023-05-20 MED ORDER — LAMOTRIGINE 200 MG PO TABS: 200.0000 mg | ORAL_TABLET | ORAL | 2 refills | Status: DC

## 2023-05-20 MED ORDER — METHYLPHENIDATE HCL 5 MG PO TABS: ORAL_TABLET | ORAL | 0 refills | Status: DC

## 2023-05-20 MED ORDER — TRINTELLIX 20 MG PO TABS: 20.0000 mg | ORAL_TABLET | ORAL | 2 refills | Status: DC

## 2023-05-20 NOTE — Progress Notes (Signed)
[facility] MD Virtual Progress Note   Patient Location: Home Provider Location: Home Office  I connect with patient by video and verified that I am speaking with correct person by using two identifiers. I discussed the limitations of evaluation and management by telemedicine and the availability of in person appointments. I also discussed with the patient that there may be a patient responsible charge related to this service. The patient expressed understanding and agreed to proceed.  [name] [id number] 78 y.o.  05/20/2023 10:48 AM  History of Present Illness:  Patient is evaluated by video session.  She reported things are doing much better since back on the Lamictal 200 mg.  We have cut down to 150 after she was complaining of feeling tired and fatigued but she noticed reducing the dose causes increased depression.  She had a good Christmas.  She was able to see both of her children.  She is sleeping good with the help of trazodone and CPAP.  Her attention concentration is good.  She is able to do multitasking and time management.  She denies any feeling of hopelessness or worthlessness.  She denies any tremors, shakes or any EPS.  She feels the Trintellix and other medication keeping her stable.  Recently husband has oral surgery but he is recovering better.  She denies drinking or using any illegal substances.  Her appetite is okay.  Her weight is stable.  Past Psychiatric History: H/O mania, irritability, impulsive behavior and overdose at age 72. H/O inpatient twice. Had psychological testing and diagnosed ADD. Took Concerta, (Adderall, Vyvanse made anxious) Wellbutrin, Prozac, lithium, Effexor, Lexapro, Abilify and Cymbalta.  Best when given female hormone to help endometriosis.  Had ECT, did not work. TMS helped. No history of psychosis.     Outpatient Encounter Medications as of 05/20/2023  Medication Sig   calcium-vitamin D (OSCAL WITH D) 500-200 MG-UNIT  tablet Take 2 tablets by mouth 2 (two) times daily.   cetirizine (ZYRTEC) 10 MG tablet Take 10 mg by mouth daily as needed for allergies.    estradiol (ESTRACE) 0.5 MG tablet Take 1 tablet (0.5 mg total) by mouth daily.   lamoTRIgine (LAMICTAL) 200 MG tablet Take 1 tablet (200 mg total) by mouth every morning.   levothyroxine (SYNTHROID) 25 MCG tablet Take 1 tablet (25 mcg total) by mouth daily before breakfast. 30 minutes   methylphenidate (RITALIN) 5 MG tablet Take one tab in am   Multiple Vitamins-Minerals (MULTIVITAMIN WITH MINERALS) tablet Take 1 tablet by mouth daily.   omeprazole (PRILOSEC) 20 MG capsule TAKE ONE CAPSULE BY MOUTH DAILY 30 MINUTES PRIOR TO LUNCH. MUST WAIT 3-4 HOURS AFTER THYROID PILL   ondansetron (ZOFRAN-ODT) 4 MG disintegrating tablet Take 1 tablet (4 mg total) by mouth 2 (two) times daily as needed for nausea or vomiting. AND PRN   Probiotic Product (PROBIOTIC PO) Take by mouth.   traZODone (DESYREL) 50 MG tablet Take 1 tablet (50 mg total) by mouth at bedtime as needed for sleep.   TRINTELLIX 20 MG TABS tablet Take 1 tablet (20 mg total) by mouth every morning.   No facility-administered encounter medications on file as of 05/20/2023.    No results found for this or any previous visit (from the past 2160 hours).   Psychiatric Specialty Exam: Physical Exam  Review of Systems  Weight 154 lb (69.9 kg).There is no height or weight on file to calculate BMI.  General Appearance: Casual  Eye Contact:  Good  Speech:  Clear and Coherent and Normal Rate  Volume:  Normal  Mood:  Euthymic  Affect:  Appropriate  Thought Process:  Goal Directed  Orientation:  Full (Time, Place, and Person)  Thought Content:  WDL  Suicidal Thoughts:  No  Homicidal Thoughts:  No  Memory:  Immediate;   Good Recent;   Good Remote;   Good  Judgement:  Good  Insight:  Good  Psychomotor Activity:  Normal  Concentration:  Concentration: Good and Attention Span: Good  Recall:  Good  Fund  of Knowledge:  Good  Language:  Good  Akathisia:  No  Handed:  Right  AIMS (if indicated):     Assets:  Communication Skills Desire for Improvement Housing Resilience Social Support Talents/Skills Transportation  ADL's:  Intact  Cognition:  WNL  Sleep:  good , uses CPAP     Assessment/Plan: Moderate episode of recurrent major depressive disorder (HCC) - Plan: traZODone (DESYREL) 50 MG tablet, TRINTELLIX 20 MG TABS tablet, lamoTRIgine (LAMICTAL) 200 MG tablet  Attention deficit hyperactivity disorder (ADHD), predominantly inattentive type - Plan: methylphenidate (RITALIN) 5 MG tablet  Patient is stable on her current medication.  Since back on Lamictal 200 mg her depression is much better.  She has no rash, itching, tremors or shakes.  Continue Lamictal 200 mg daily, Trintellix 20 mg daily, trazodone 50 mg at bedtime and Ritalin 5 mg every day.  In the past she had tried higher dose of Ritalin but causes anxiety.  Discussed medication side effects and benefits especially stimulant abuse, tolerance, withdrawal.  Follow-up in 3 months   Follow Up Instructions:     I discussed the assessment and treatment plan with the patient. The patient was provided an opportunity to ask questions and all were answered. The patient agreed with the plan and demonstrated an understanding of the instructions.   The patient was advised to call back or seek an in-person evaluation if the symptoms worsen or if the condition fails to improve as anticipated.    Collaboration of Care: Other provider involved in patient's care AEB notes are available in epic to review  Patient/Guardian was advised Release of Information must be obtained prior to any record release in order to collaborate their care with an outside provider. Patient/Guardian was advised if they have not already done so to contact the registration department to sign all necessary forms in order for [name] to release information regarding their  care.   Consent: Patient/Guardian gives verbal consent for treatment and assignment of benefits for services provided during this visit. Patient/Guardian expressed understanding and agreed to proceed.     I provided 19 minutes of non face to face time during this encounter.  Note: This document was prepared by [employer] voice dictation technology and any errors that results from this process are unintentional.    Cleotis Nipper, MD 05/20/2023

## 2023-06-23 ENCOUNTER — Telehealth (HOSPITAL_COMMUNITY): Payer: Self-pay | Admitting: *Deleted

## 2023-06-23 DIAGNOSIS — F900 Attention-deficit hyperactivity disorder, predominantly inattentive type: Secondary | ICD-10-CM

## 2023-06-23 MED ORDER — METHYLPHENIDATE HCL 5 MG PO TABS: ORAL_TABLET | ORAL | 0 refills | Status: DC

## 2023-06-23 NOTE — Telephone Encounter (Signed)
Pt called requesting a refill of the generic Ritalin 5 mg. E-scribed at pt's last visit on 05/20/23. Pt has f/u scheduled for 08/18/23. Please review.

## 2023-06-23 NOTE — Telephone Encounter (Signed)
 Done

## 2023-07-09 ENCOUNTER — Inpatient Hospital Stay: Payer: Medicare Other | Attending: Oncology

## 2023-07-09 DIAGNOSIS — Z08 Encounter for follow-up examination after completed treatment for malignant neoplasm: Secondary | ICD-10-CM | POA: Insufficient documentation

## 2023-07-09 DIAGNOSIS — D509 Iron deficiency anemia, unspecified: Secondary | ICD-10-CM | POA: Insufficient documentation

## 2023-07-09 DIAGNOSIS — Z85038 Personal history of other malignant neoplasm of large intestine: Secondary | ICD-10-CM | POA: Insufficient documentation

## 2023-07-09 DIAGNOSIS — C189 Malignant neoplasm of colon, unspecified: Secondary | ICD-10-CM

## 2023-07-09 LAB — CBC WITH DIFFERENTIAL (CANCER CENTER ONLY)
Abs Immature Granulocytes: 0.04 10*3/uL (ref 0.00–0.07)
Basophils Absolute: 0.1 10*3/uL (ref 0.0–0.1)
Basophils Relative: 1 %
Eosinophils Absolute: 0 10*3/uL (ref 0.0–0.5)
Eosinophils Relative: 0 %
HCT: 39.7 % (ref 36.0–46.0)
Hemoglobin: 13 g/dL (ref 12.0–15.0)
Immature Granulocytes: 0 %
Lymphocytes Relative: 33 %
Lymphs Abs: 3.7 10*3/uL (ref 0.7–4.0)
MCH: 30.3 pg (ref 26.0–34.0)
MCHC: 32.7 g/dL (ref 30.0–36.0)
MCV: 92.5 fL (ref 80.0–100.0)
Monocytes Absolute: 0.6 10*3/uL (ref 0.1–1.0)
Monocytes Relative: 6 %
Neutro Abs: 6.7 10*3/uL (ref 1.7–7.7)
Neutrophils Relative %: 60 %
Platelet Count: 304 10*3/uL (ref 150–400)
RBC: 4.29 MIL/uL (ref 3.87–5.11)
RDW: 12.6 % (ref 11.5–15.5)
WBC Count: 11.1 10*3/uL — ABNORMAL HIGH (ref 4.0–10.5)
nRBC: 0 % (ref 0.0–0.2)

## 2023-07-09 LAB — CMP (CANCER CENTER ONLY)
ALT: 13 U/L (ref 0–44)
AST: 13 U/L — ABNORMAL LOW (ref 15–41)
Albumin: 3.8 g/dL (ref 3.5–5.0)
Alkaline Phosphatase: 62 U/L (ref 38–126)
Anion gap: 6 (ref 5–15)
BUN: 13 mg/dL (ref 8–23)
CO2: 30 mmol/L (ref 22–32)
Calcium: 9.5 mg/dL (ref 8.9–10.3)
Chloride: 102 mmol/L (ref 98–111)
Creatinine: 0.99 mg/dL (ref 0.44–1.00)
GFR, Estimated: 59 mL/min — ABNORMAL LOW (ref >60)
Glucose, Bld: 101 mg/dL — ABNORMAL HIGH (ref 70–99)
Potassium: 4.4 mmol/L (ref 3.5–5.1)
Sodium: 138 mmol/L (ref 135–145)
Total Bilirubin: 0.5 mg/dL (ref 0.0–1.2)
Total Protein: 6.9 g/dL (ref 6.5–8.1)

## 2023-07-10 LAB — CEA: CEA: 1.5 ng/mL (ref 0.0–4.7)

## 2023-07-12 ENCOUNTER — Ambulatory Visit: Payer: Medicare Other | Admitting: Oncology

## 2023-07-13 LAB — COMP PANEL: LEUKEMIA/LYMPHOMA: Immunophenotypic Profile: 18

## 2023-07-19 ENCOUNTER — Inpatient Hospital Stay (HOSPITAL_BASED_OUTPATIENT_CLINIC_OR_DEPARTMENT_OTHER): Payer: Medicare Other | Admitting: Oncology

## 2023-07-19 ENCOUNTER — Encounter: Payer: Self-pay | Admitting: Oncology

## 2023-07-19 VITALS — BP 137/60 | HR 64 | Temp 96.6°F | Resp 18 | Wt 162.3 lb

## 2023-07-19 DIAGNOSIS — C189 Malignant neoplasm of colon, unspecified: Secondary | ICD-10-CM | POA: Diagnosis not present

## 2023-07-19 DIAGNOSIS — D509 Iron deficiency anemia, unspecified: Secondary | ICD-10-CM | POA: Diagnosis not present

## 2023-07-19 DIAGNOSIS — D72820 Lymphocytosis (symptomatic): Secondary | ICD-10-CM | POA: Diagnosis not present

## 2023-07-19 DIAGNOSIS — Z85038 Personal history of other malignant neoplasm of large intestine: Secondary | ICD-10-CM | POA: Diagnosis not present

## 2023-07-19 DIAGNOSIS — Z08 Encounter for follow-up examination after completed treatment for malignant neoplasm: Secondary | ICD-10-CM | POA: Diagnosis not present

## 2023-07-19 NOTE — Assessment & Plan Note (Addendum)
#  History of stage IIA right side colon cancer (03/2017), with high risk feasures: Lymphovascular Invasion, Perineural Invasion, Patient declined adjuvant chemotherapy. She declined repeat colonoscopy  Labs are reviewed and discussed with patient CEA is stable and normal. Not elevated at baseline.  07/08/2022 CT chest abdomen pelvis w contrast showed no recurrence.  She is more than 5 year after surgery, will stop routine CT scan.  Future Imaging as clinically indicated.

## 2023-07-19 NOTE — Assessment & Plan Note (Signed)
She does not have any constitutional symptoms. No lymphadenopathy on physical exam.  Continue observation.

## 2023-07-19 NOTE — Progress Notes (Signed)
 Hematology/Oncology Progress note [phone] [phone]      REASON FOR VISIT Follow up for treatment of management of colon cancer and iron deficiency anemia.  ASSESSMENT & PLAN:   Cancer Staging  Cancer of right colon [facility]) Staging form: Colon and Rectum, AJCC 8th Edition - Clinical: cT1 - Unsigned - Pathologic stage from 03/08/2017: Stage IIA (pT3, pN0, cM0) - Signed by Rickard Patience, MD on 03/08/2017   Colon cancer [facility]) #History of stage IIA right side colon cancer (03/2017), with high risk feasures: Lymphovascular Invasion, Perineural Invasion, Patient declined adjuvant chemotherapy. She declined repeat colonoscopy  Labs are reviewed and discussed with patient CEA is stable and normal. Not elevated at baseline.  07/08/2022 CT chest abdomen pelvis w contrast showed no recurrence.  She is more than 5 year after surgery, will stop routine CT scan.  Future Imaging as clinically indicated.   Monoclonal B-cell lymphocytosis of undetermined significance She does not have any constitutional symptoms. No lymphadenopathy on physical exam.  Continue observation.   Orders Placed This Encounter  Procedures   CEA    Standing Status:   Future    Expected Date:   07/18/2024    Expiration Date:   07/18/2024   CMP (Cancer Center only)    Standing Status:   Future    Expected Date:   07/18/2024    Expiration Date:   07/18/2024   CBC with Differential (Cancer Center Only)    Standing Status:   Future    Expected Date:   07/18/2024    Expiration Date:   07/18/2024   Follow up in 1 year All questions were answered. The patient knows to call the clinic with any problems, questions or concerns.  Rickard Patience, MD, PhD [facility] Hematology Oncology 07/19/2023    HISTORY OF PRESENTING ILLNESS:  a colonoscopy done on 12/25/2016 by Dr. Mechele Collin. Moves her bowels daily. Patient states she is nausea, states it started at the end of June .She states she was having pain in her left  upper quadrant pain was seen in the ER on 12/24/2016 Upper and lower endoscopies dated 12/28/2016 reviewed which showed Cecal mass, biopsy-proven adenocarcinoma. Patient underwent laparoscopically assisted right hemicolectomy with ileotransverse colostomy. Pathology revealed pT3N0 adenocarcinoma, with perineural invasion and lymphvascular invasion  #  Cancer Stage: pT3N0cM0, stage colon cancer.  Lymphovascular Invasion:  Present  Perineural Invasion:  Present  Patient declined adjuvant chemotherapy. MMR- intact.  #Repeat colonoscopy 01/17/2018 by Dr. Mechele Collin showed diverticulosis in the sigmoid colon, in the descending colon and transverse colon.  Internal hemorrhoids.  No specimen collected.  #Nausea.  Chronic problem for her.Etiology unclear.    07/06/2021 CT chest abdomen pelvis w contrast showed no recurrence.   Interval History Patient presents for follow-up of history of stage II colon cancer and monoclonal lymphocytosis.  She reports feeling well. Denies weight loss, fever, chills, fatigue, night sweats.  No abdominal pain blood in stool.   Review of Systems  Constitutional:  Negative for chills, fever, malaise/fatigue and weight loss.  HENT:  Negative for sore throat.   Eyes:  Negative for redness.  Respiratory:  Negative for cough, shortness of breath and wheezing.   Cardiovascular:  Negative for chest pain, palpitations and leg swelling.  Gastrointestinal:  Negative for abdominal pain, blood in stool, nausea and vomiting.  Genitourinary:  Negative for dysuria.  Musculoskeletal:  Negative for myalgias.  Skin:  Negative for rash.  Neurological:  Negative for dizziness, tingling and tremors.  Endo/Heme/Allergies:  Does not  bruise/bleed easily.  Psychiatric/Behavioral:  Negative for hallucinations.      MEDICAL HISTORY:  Past Medical History:  Diagnosis Date   Allergy    Anemia    Carcinoma (HCC)    Colon cancer (HCC) 01/2017   Colon polyps    Diverticulitis    01/2018  abscess hosp. 01/2018   Endometriosis    Fibromyalgia    Gastric polyps    GERD (gastroesophageal reflux disease)    Hemorrhoids    Hypothyroidism    Iron deficiency anemia 03/08/2017   Major depressive disorder, recurrent episode, mild (HCC)    Dr. Lolly Mustache in [place] h/o ECT x 6 x and hosp x 2 and transcranial magnetic treatment    Miscarriage    x2   PONV (postoperative nausea and vomiting)    Skin cancer    arm, thigh and chest Dr. Adolphus Birchwood    Sleep apnea    USES CPAP   UTI (urinary tract infection)     SURGICAL HISTORY: Past Surgical History:  Procedure Laterality Date   ABDOMINAL HYSTERECTOMY  1990   endometriosis   BREAST BIOPSY Left 2004   benign   CATARACT EXTRACTION W/PHACO Right 10/26/2018   Procedure: CATARACT EXTRACTION PHACO AND INTRAOCULAR LENS PLACEMENT (IOC) RIGHT TORIC LENS;  Surgeon: Lockie Mola, MD;  Location: [facility];  Service: Ophthalmology;  Laterality: Right;  sleep apnea   CATARACT EXTRACTION, BILATERAL Bilateral 05/2012   CHOLECYSTECTOMY  2001   COLONOSCOPY WITH PROPOFOL N/A 12/28/2016   Procedure: COLONOSCOPY WITH PROPOFOL;  Surgeon: Scot Jun, MD;  Location: [facility] ENDOSCOPY;  Service: Endoscopy;  Laterality: N/A;   COLONOSCOPY WITH PROPOFOL N/A 01/17/2018   Procedure: COLONOSCOPY WITH PROPOFOL;  Surgeon: Scot Jun, MD;  Location: [facility] ENDOSCOPY;  Service: Endoscopy;  Laterality: N/A;   ESOPHAGOGASTRODUODENOSCOPY (EGD) WITH PROPOFOL N/A 12/28/2016   Procedure: ESOPHAGOGASTRODUODENOSCOPY (EGD) WITH PROPOFOL;  Surgeon: Scot Jun, MD;  Location: [facility] ENDOSCOPY;  Service: Endoscopy;  Laterality: N/A;   EYE SURGERY     lens left eye, cataract right eye    LAPAROSCOPIC RIGHT COLECTOMY Right 01/21/2017   Procedure: LAPAROSCOPIC ASSISTED RIGHT COLECTOMY;  Surgeon: Earline Mayotte, MD;  Location: [facility] ORS;  Service: General;  Laterality: Right;   TONSILLECTOMY      SOCIAL HISTORY: Social History   Socioeconomic History    Marital status: Married    Spouse name: Not on file   Number of children: Not on file   Years of education: Not on file   Highest education level: Not on file  Occupational History   Not on file  Tobacco Use   Smoking status: Never   Smokeless tobacco: Never  Vaping Use   Vaping status: Never Used  Substance and Sexual Activity   Alcohol use: Not Currently    Alcohol/week: 0.0 standard drinks of alcohol    Comment:     Drug use: No   Sexual activity: Yes    Partners: Male    Birth control/protection: None  Other Topics Concern   Not on file  Social History Narrative   Light smoker age 34 stopped    2 sons    Married    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, housewife    Owns guns, wears seat belt, safe in relationship    Social Drivers of Corporate investment banker Strain: Low Risk  (08/11/2022)   Overall Financial Resource Strain (CARDIA)    Difficulty of Paying Living Expenses: Not hard at all  Food Insecurity: No Food  Insecurity (08/11/2022)   Hunger Vital Sign    Worried About Running Out of Food in the Last Year: Never true    Ran Out of Food in the Last Year: Never true  Transportation Needs: No Transportation Needs (08/11/2022)   PRAPARE - Administrator, Civil Service (Medical): No    Lack of Transportation (Non-Medical): No  Physical Activity: Inactive (08/11/2022)   Exercise Vital Sign    Days of Exercise per Week: 0 days    Minutes of Exercise per Session: 0 min  Stress: No Stress Concern Present (08/11/2022)   [employer] of Occupational Health - Occupational Stress Questionnaire    Feeling of Stress : Not at all  Social Connections: Moderately Integrated (08/11/2022)   Social Connection and Isolation Panel [NHANES]    Frequency of Communication with Friends and Family: More than three times a week    Frequency of Social Gatherings with Friends and Family: Once a week    Attends Religious Services: More than 4 times per year    Active Member of [employer] or  Organizations: No    Attends Banker Meetings: Never    Marital Status: Married  Catering manager Violence: Not At Risk (08/11/2022)   Humiliation, Afraid, Rape, and Kick questionnaire    Fear of Current or Ex-Partner: No    Emotionally Abused: No    Physically Abused: No    Sexually Abused: No    FAMILY HISTORY: Family History  Problem Relation Age of Onset   Depression Brother    Alcohol abuse Brother    Suicidality Brother    Stroke Mother    Hyperlipidemia Mother    Hypertension Mother    Clotting disorder Brother    Cancer Brother        MDS   Colon polyps Father    Melanoma Sister    Cancer Sister        skin MM   Stroke Maternal Grandfather    Melanoma Nephew    Bipolar disorder Neg Hx     ALLERGIES:  is allergic to other and codeine.  MEDICATIONS:  Current Outpatient Medications  Medication Sig Dispense Refill   calcium-vitamin D (OSCAL WITH D) 500-200 MG-UNIT tablet Take 2 tablets by mouth 2 (two) times daily.     cetirizine (ZYRTEC) 10 MG tablet Take 10 mg by mouth daily as needed for allergies.      estradiol (ESTRACE) 0.5 MG tablet Take 1 tablet (0.5 mg total) by mouth daily. 90 tablet 3   lamoTRIgine (LAMICTAL) 200 MG tablet Take 1 tablet (200 mg total) by mouth every morning. 30 tablet 2   levothyroxine (SYNTHROID) 25 MCG tablet Take 1 tablet (25 mcg total) by mouth daily before breakfast. 30 minutes 90 tablet 3   methylphenidate (RITALIN) 5 MG tablet Take one tab in am 30 tablet 0   Multiple Vitamins-Minerals (MULTIVITAMIN WITH MINERALS) tablet Take 1 tablet by mouth daily.     omeprazole (PRILOSEC) 20 MG capsule TAKE ONE CAPSULE BY MOUTH DAILY 30 MINUTES PRIOR TO LUNCH. MUST WAIT 3-4 HOURS AFTER THYROID PILL 90 capsule 3   ondansetron (ZOFRAN-ODT) 4 MG disintegrating tablet Take 1 tablet (4 mg total) by mouth 2 (two) times daily as needed for nausea or vomiting. AND PRN 180 tablet 3   Probiotic Product (PROBIOTIC PO) Take by mouth.      traZODone (DESYREL) 50 MG tablet Take 1 tablet (50 mg total) by mouth at bedtime as needed for sleep. 90 tablet  0   TRINTELLIX 20 MG TABS tablet Take 1 tablet (20 mg total) by mouth every morning. 30 tablet 2   No current facility-administered medications for this visit.      Marland Kitchen  PHYSICAL EXAMINATION: ECOG PERFORMANCE STATUS: 1 - Symptomatic but completely ambulatory Vitals:   07/19/23 1014  BP: 137/60  Pulse: 64  Resp: 18  Temp: (!) 96.6 F (35.9 C)  SpO2: 100%   Filed Weights   07/19/23 1014  Weight: 162 lb 4.8 oz (73.6 kg)   Physical Exam Constitutional:      General: She is not in acute distress.    Appearance: She is not diaphoretic.  HENT:     Head: Normocephalic and atraumatic.     Mouth/Throat:     Pharynx: No oropharyngeal exudate.  Eyes:     General: No scleral icterus.    Pupils: Pupils are equal, round, and reactive to light.  Cardiovascular:     Rate and Rhythm: Normal rate and regular rhythm.     Heart sounds: No murmur heard. Pulmonary:     Effort: Pulmonary effort is normal. No respiratory distress.     Breath sounds: No rales.  Chest:     Chest wall: No tenderness.  Abdominal:     General: There is no distension.     Palpations: Abdomen is soft.     Tenderness: There is no abdominal tenderness.  Musculoskeletal:        General: Normal range of motion.     Cervical back: Normal range of motion and neck supple.  Skin:    General: Skin is warm and dry.     Findings: No erythema.  Neurological:     Mental Status: She is alert and oriented to person, place, and time.  Psychiatric:        Mood and Affect: Affect normal.       LABORATORY DATA:  I have reviewed the data as listed    Latest Ref Rng & Units 07/09/2023   11:00 AM 07/08/2022    9:12 AM 01/23/2022    9:42 AM  CBC  WBC 4.0 - 10.5 K/uL 11.1  7.6  8.9   Hemoglobin 12.0 - 15.0 g/dL 16.1  09.6  04.5   Hematocrit 36.0 - 46.0 % 39.7  41.6  38.7   Platelets 150 - 400 K/uL 304  232  272.0        Latest Ref Rng & Units 07/09/2023   11:00 AM 07/08/2022    9:12 AM 07/07/2022    2:42 PM  CMP  Glucose 70 - 99 mg/dL 409  84    BUN 8 - 23 mg/dL 13  9    Creatinine 8.11 - 1.00 mg/dL 9.14  7.82  9.56   Sodium 135 - 145 mmol/L 138  140    Potassium 3.5 - 5.1 mmol/L 4.4  4.3    Chloride 98 - 111 mmol/L 102  103    CO2 22 - 32 mmol/L 30  29    Calcium 8.9 - 10.3 mg/dL 9.5  9.4    Total Protein 6.5 - 8.1 g/dL 6.9  6.3    Total Bilirubin 0.0 - 1.2 mg/dL 0.5  0.5    Alkaline Phos 38 - 126 U/L 62  56    AST 15 - 41 U/L 13  16    ALT 0 - 44 U/L 13  14        RADIOGRAPHIC STUDIES: I have personally reviewed the radiological images  as listed and agreed with the findings in the report. No results found.

## 2023-07-29 ENCOUNTER — Telehealth (HOSPITAL_COMMUNITY): Payer: Self-pay | Admitting: *Deleted

## 2023-07-29 DIAGNOSIS — F900 Attention-deficit hyperactivity disorder, predominantly inattentive type: Secondary | ICD-10-CM

## 2023-07-29 MED ORDER — METHYLPHENIDATE HCL 5 MG PO TABS
ORAL_TABLET | ORAL | 0 refills | Status: DC
Start: 2023-07-29 — End: 2023-08-18

## 2023-07-29 NOTE — Telephone Encounter (Signed)
 Pt called requesting a refill of the Ritalin 5 mg qam. Last script was e-scribed on 06/23/23. Pt was last seen on 05/20/23 and has f/u scheduled for 08/18/23. Please review.

## 2023-07-29 NOTE — Telephone Encounter (Signed)
 Done

## 2023-08-18 ENCOUNTER — Encounter (HOSPITAL_COMMUNITY): Payer: Self-pay | Admitting: Psychiatry

## 2023-08-18 ENCOUNTER — Telehealth (HOSPITAL_COMMUNITY): Payer: Medicare Other | Admitting: Psychiatry

## 2023-08-18 DIAGNOSIS — F331 Major depressive disorder, recurrent, moderate: Secondary | ICD-10-CM | POA: Diagnosis not present

## 2023-08-18 DIAGNOSIS — F900 Attention-deficit hyperactivity disorder, predominantly inattentive type: Secondary | ICD-10-CM

## 2023-08-18 MED ORDER — TRAZODONE HCL 50 MG PO TABS: 50.0000 mg | ORAL_TABLET | Freq: Every evening | ORAL | 0 refills | Status: DC | PRN

## 2023-08-18 MED ORDER — METHYLPHENIDATE HCL 5 MG PO TABS: ORAL_TABLET | ORAL | 0 refills | Status: DC

## 2023-08-18 MED ORDER — LAMOTRIGINE 200 MG PO TABS: 200.0000 mg | ORAL_TABLET | ORAL | 2 refills | Status: DC

## 2023-08-18 MED ORDER — TRINTELLIX 20 MG PO TABS: 20.0000 mg | ORAL_TABLET | ORAL | 2 refills | Status: DC

## 2023-08-18 NOTE — Progress Notes (Signed)
 [facility] MD Virtual Progress Note   Patient Location: Home Provider Location: Home Office  I connect with patient by video and verified that I am speaking with correct person by using two identifiers. I discussed the limitations of evaluation and management by telemedicine and the availability of in person appointments. I also discussed with the patient that there may be a patient responsible charge related to this service. The patient expressed understanding and agreed to proceed.  [name] [id number] 78 y.o.  08/18/2023 9:57 AM  History of Present Illness:  Patient is evaluated by video session.  She is taking all her medication as prescribed.  She is sleeping good with the help of trazodone and she also uses CPAP.  Her energy level is good.  Her attention concentration is good.  She had a trip to [facility] and now she excited about upcoming trip to [facility] with her husband.  Patient told they are going to celebrate 75 wedding anniversary.  Patient reported children's are doing very well.  Patient told her grandchild excepted at [place] of [place] and other 1 is going to [employer] of [place].  Patient reported medicine helping her attention concentration, focus and multitasking.  She denies any feeling of hopelessness or worthlessness.  She denies any paranoia, hallucination, agitation or irritability.  She has no rash, itching tremors or shakes.  She like to keep the current medication.  Her weight is stable.  Her appetite is okay.  She is pleased that husband is fully recover from surgery and back to his normal routine.  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  Patient recently had a visit with cancer doctor and labs are drawn.  Labs are stable.  Past Psychiatric History: H/O mania, irritability, impulsive behavior and overdose at age 13. H/O inpatient twice. Had psychological testing and diagnosed ADD. Took  Concerta, (Adderall, Vyvanse made anxious) Wellbutrin, Prozac, lithium, Effexor, Lexapro, Abilify and Cymbalta.  Best when given female hormone to help endometriosis.  Had ECT, did not work. TMS helped. No history of psychosis.     Outpatient Encounter Medications as of 08/18/2023  Medication Sig   calcium-vitamin D (OSCAL WITH D) 500-200 MG-UNIT tablet Take 2 tablets by mouth 2 (two) times daily.   cetirizine (ZYRTEC) 10 MG tablet Take 10 mg by mouth daily as needed for allergies.    estradiol (ESTRACE) 0.5 MG tablet Take 1 tablet (0.5 mg total) by mouth daily.   lamoTRIgine (LAMICTAL) 200 MG tablet Take 1 tablet (200 mg total) by mouth every morning.   levothyroxine (SYNTHROID) 25 MCG tablet Take 1 tablet (25 mcg total) by mouth daily before breakfast. 30 minutes   methylphenidate (RITALIN) 5 MG tablet Take one tab in am   Multiple Vitamins-Minerals (MULTIVITAMIN WITH MINERALS) tablet Take 1 tablet by mouth daily.   omeprazole (PRILOSEC) 20 MG capsule TAKE ONE CAPSULE BY MOUTH DAILY 30 MINUTES PRIOR TO LUNCH. MUST WAIT 3-4 HOURS AFTER THYROID PILL   ondansetron (ZOFRAN-ODT) 4 MG disintegrating tablet Take 1 tablet (4 mg total) by mouth 2 (two) times daily as needed for nausea or vomiting. AND PRN   Probiotic Product (PROBIOTIC PO) Take by mouth.   traZODone (DESYREL) 50 MG tablet Take 1 tablet (50 mg total) by mouth at bedtime as needed for sleep.   TRINTELLIX 20 MG TABS tablet Take 1 tablet (20 mg total) by mouth every morning.   No facility-administered encounter medications on file as  of 08/18/2023.    Recent Results (from the past 2160 hours)  Flow cytometry panel-leukemia/lymphoma work-up     Status: None   Collection Time: 07/09/23 11:00 AM  Result Value Ref Range   PATH INTERP XXX-IMP Comment     Comment: (NOTE) CD5+, CD23+ clonal B cell population detected, CLL/SLL phenotype, CD38 negative and CD19, CD20 and CD22 positive, 18% of leukocytes, <5000 cells/uL. (See comment.)     ANNOTATION COMMENT IMP Comment     Comment: (NOTE) A small monoclonal B-cell population with a CLL phenotype is present. If the patient has a prior diagnosis of CLL, then the findings may represent persistent involvement. If the patient is not known to have CLL or SLL, further investigation is recommended if clinically indicated. In the absence of CLL or SLL, small (<5000/uL) clonal B-cell populations in the blood are classified as monoclonal B-cell lymphocytosis. Clinical correlation is required. Previous similar phenotyping reports from 07/08/22 and 08/05/21 were reviewed.    CLINICAL INFO Comment     Comment: Accompanying CBC dated 07/09/2023 shows: WBC count 11.1.   Specimen Type Comment     Comment: Peripheral blood   ASSESSMENT OF LEUKOCYTES Comment     Comment: (NOTE) A CD5+, CD23+, CD38- (4%) clonal B cell population is detected with dim lambda light chain restriction, representing 98% of the B cells and 18% of the leukocytes. The phenotype is typical of chronic lymphocytic leukemia (CLL) /small lymphocytic lymphoma (SLL). There is no loss of, or aberrant expression of, the pan T cell antigens to suggest a neoplastic T cell process. CD4:CD8 ratio 1.7 No circulating blasts are detected. There is no immunophenotypic evidence of abnormal myeloid maturation. Analysis of the leukocyte population shows: granulocytes 67%, monocytes 5%, lymphocytes 28%, blasts <0.1%, B cells 18%, T cells 8%, NK cells 2%.    % Viable Cells Comment     Comment: 97%   Immunophenotypic Profile 18% of total cells (Phenotype below)     Comment: Comment Abnormal cell population: present    ANALYSIS AND GATING STRATEGY Comment     Comment: (NOTE) 8 color analysis with CD45/SSC gating  Technical-Analysis performed at [facility] of [address], [address] [place], [place] [address], Director: Maurine Simmering, MDPhD    IMMUNOPHENOTYPING STUDY Comment     Comment: (NOTE) CD2        (-)            CD3       (-) CD4       (-)            CD5       (+) CD7       (-)            CD8       (-) CD10      (-)            CD11b     (-) CD11c     (+)            CD13      (-) CD14      (-)            CD15      (-) CD16      (-)            CD19      (+) CD20      (+) Dim        CD22      (+) Dim CD23      (+)  CD33      (-) CD34      (-)            CD38      (-) CD45      (+)            CD56      (-) CD57      (-)            CD103     (-) CD117     (-)            FMC-7     (-) HLA-DR    (+)            KAPPA     (-) LAMBDA    (+) Dim        CD64      (-)    PATHOLOGIST NAME Comment     Comment: Fredricka Jenny, M.D.   COMMENT: Comment     Comment: (NOTE) Each antibody in this assay was utilized to assess for potential abnormalities of studied cell populations or to characterize identified abnormalities. This test was developed and its performance characteristics determined by [facility].  It has not been cleared or approved by the U.S. Food and Drug Administration. The FDA has determined that such clearance or approval is not necessary. This test is used for clinical purposes.  It should not be regarded as investigational or for research. Performed At: -[facility] [address] [place], [place] [id number] Adams Adams MDPhD [id number] Performed At: [facility] [address] [place], [place] [id number] Adams Adams MDPhD [id number]   CEA     Status: None   Collection Time: 07/09/23 11:00 AM  Result Value Ref Range   CEA 1.5 0.0 - 4.7 ng/mL    Comment: (NOTE)                             Nonsmokers          <3.9                             Smokers             <5.6 Roche Diagnostics Electrochemiluminescence Immunoassay (ECLIA) Values obtained with different assay methods or kits cannot be used interchangeably.  Results cannot be interpreted as absolute evidence of the presence or absence of malignant disease. Performed At: [facility] [address] [place], [place] [id number] Pearlean Botts MD [id number]   CMP (Cancer Center only)     Status: Abnormal   Collection Time: 07/09/23 11:00 AM  Result Value Ref Range   Sodium 138 135 - 145 mmol/L   Potassium 4.4 3.5 - 5.1 mmol/L   Chloride 102 98 - 111 mmol/L   CO2 30 22 - 32 mmol/L   Glucose, Bld 101 (H) 70 - 99 mg/dL    Comment: Glucose reference range applies only to samples taken after fasting for at least 8 hours.   BUN 13 8 - 23 mg/dL   Creatinine 6.06 3.01 - 1.00 mg/dL   Calcium 9.5 8.9 - 60.1 mg/dL   Total Protein 6.9 6.5 - 8.1 g/dL   Albumin 3.8 3.5 - 5.0 g/dL   AST 13 (L) 15 - 41 U/L   ALT 13 0 - 44 U/L   Alkaline Phosphatase 62 38 - 126 U/L   Total  Bilirubin 0.5 0.0 - 1.2 mg/dL   GFR, Estimated 59 (L) >60 mL/min    Comment: (NOTE) Calculated using the CKD-EPI Creatinine Equation (2021)    Anion gap 6 5 - 15    Comment: Performed at [facility], [address] [place], [place] [address]  CBC with Differential (Cancer Center Only)     Status: Abnormal   Collection Time: 07/09/23 11:00 AM  Result Value Ref Range   WBC Count 11.1 (H) 4.0 - 10.5 K/uL   RBC 4.29 3.87 - 5.11 MIL/uL   Hemoglobin 13.0 12.0 - 15.0 g/dL   HCT 91.4 78.2 - 95.6 %   MCV 92.5 80.0 - 100.0 fL   MCH 30.3 26.0 - 34.0 pg   MCHC 32.7 30.0 - 36.0 g/dL   RDW 21.3 08.6 - 57.8 %   Platelet Count 304 150 - 400 K/uL   nRBC 0.0 0.0 - 0.2 %   Neutrophils Relative % 60 %   Neutro Abs 6.7 1.7 - 7.7 K/uL   Lymphocytes Relative 33 %   Lymphs Abs 3.7 0.7 - 4.0 K/uL   Monocytes Relative 6 %   Monocytes Absolute 0.6 0.1 - 1.0 K/uL   Eosinophils Relative 0 %   Eosinophils Absolute 0.0 0.0 - 0.5 K/uL   Basophils Relative 1 %   Basophils Absolute 0.1 0.0 - 0.1 K/uL   Immature Granulocytes 0 %   Abs Immature Granulocytes 0.04 0.00 - 0.07 K/uL    Comment: Performed at [facility], [address] [place], [place] [address]     Psychiatric Specialty  Exam: Physical Exam  Review of Systems  Weight 157 lb (71.2 kg).There is no height or weight on file to calculate BMI.  General Appearance: Casual  Eye Contact:  Good  Speech:  Clear and Coherent and Normal Rate  Volume:  Normal  Mood:  Euthymic  Affect:  Appropriate  Thought Process:  Goal Directed  Orientation:  Full (Time, Place, and Person)  Thought Content:  Logical  Suicidal Thoughts:  No  Homicidal Thoughts:  No  Memory:  Immediate;   Good Recent;   Good Remote;   Good  Judgement:  Good  Insight:  Good  Psychomotor Activity:  Normal  Concentration:  Concentration: Good and Attention Span: Good  Recall:  Good  Fund of Knowledge:  Good  Language:  Good  Akathisia:  No  Handed:  Right  AIMS (if indicated):     Assets:  Communication Skills Desire for Improvement Housing Physical Health Social Support Transportation  ADL's:  Intact  Cognition:  WNL  Sleep:  ok       01/26/2023    1:43 PM 08/11/2022    2:16 PM 07/22/2022   12:57 PM 01/23/2022    9:19 AM 09/01/2021   10:10 AM  Depression screen PHQ 2/9  Decreased Interest 1 0 0 0 0  Down, Depressed, Hopeless 0 0 0 0 0  PHQ - 2 Score 1 0 0 0 0  Altered sleeping 1    0  Tired, decreased energy 1    0  Change in appetite 1    0  Feeling bad or failure about yourself  0    0  Trouble concentrating 0    0  Moving slowly or fidgety/restless 0    0  Suicidal thoughts 0    0  PHQ-9 Score 4    0  Difficult doing work/chores Not difficult at all    Not difficult  at all    Assessment/Plan: Moderate episode of recurrent major depressive disorder (HCC) - Plan: lamoTRIgine (LAMICTAL) 200 MG tablet, traZODone (DESYREL) 50 MG tablet, TRINTELLIX 20 MG TABS tablet  Attention deficit hyperactivity disorder (ADHD), predominantly inattentive type - Plan: methylphenidate (RITALIN) 5 MG tablet  Patient is stable on current medication.  Her symptoms are stable.  She is looking forward for trip to [facility] on her wedding  anniversary with her husband.  She does not want to change the medication since it is working well.  Continue Lamictal 200 mg daily, Trintellix 20 mg daily, trazodone 50 mg at bedtime and Ritalin 5 mg every day.  In the past we had tried lowering Lamictal but patient started to decompensate.  Discussed stimulant abuse, tolerance, withdrawal.  Recommend to call us  back if she has any question or any concern.  Follow-up in 3 months.   Follow Up Instructions:     I discussed the assessment and treatment plan with the patient. The patient was provided an opportunity to ask questions and all were answered. The patient agreed with the plan and demonstrated an understanding of the instructions.   The patient was advised to call back or seek an in-person evaluation if the symptoms worsen or if the condition fails to improve as anticipated.    Collaboration of Care: Other provider involved in patient's care AEB notes are available in epic to review  Patient/Guardian was advised Release of Information must be obtained prior to any record release in order to collaborate their care with an outside provider. Patient/Guardian was advised if they have not already done so to contact the registration department to sign all necessary forms in order for us  to release information regarding their care.   Consent: Patient/Guardian gives verbal consent for treatment and assignment of benefits for services provided during this visit. Patient/Guardian expressed understanding and agreed to proceed.     Total encounter time 18 minutes which includes face-to-face time, chart reviewed, care coordination, order entry and documentation during this encounter.   Note: This document was prepared by [employer] voice dictation technology and any errors that results from this process are unintentional.    Arturo Late, MD 08/18/2023

## 2023-08-19 ENCOUNTER — Other Ambulatory Visit (HOSPITAL_COMMUNITY): Payer: Self-pay | Admitting: Psychiatry

## 2023-08-19 DIAGNOSIS — F331 Major depressive disorder, recurrent, moderate: Secondary | ICD-10-CM

## 2023-09-01 DIAGNOSIS — D2262 Melanocytic nevi of left upper limb, including shoulder: Secondary | ICD-10-CM | POA: Diagnosis not present

## 2023-09-01 DIAGNOSIS — D225 Melanocytic nevi of trunk: Secondary | ICD-10-CM | POA: Diagnosis not present

## 2023-09-01 DIAGNOSIS — L570 Actinic keratosis: Secondary | ICD-10-CM | POA: Diagnosis not present

## 2023-09-01 DIAGNOSIS — D2272 Melanocytic nevi of left lower limb, including hip: Secondary | ICD-10-CM | POA: Diagnosis not present

## 2023-09-01 DIAGNOSIS — D2261 Melanocytic nevi of right upper limb, including shoulder: Secondary | ICD-10-CM | POA: Diagnosis not present

## 2023-09-01 DIAGNOSIS — Z85828 Personal history of other malignant neoplasm of skin: Secondary | ICD-10-CM | POA: Diagnosis not present

## 2023-09-06 ENCOUNTER — Ambulatory Visit (INDEPENDENT_AMBULATORY_CARE_PROVIDER_SITE_OTHER): Payer: Medicare Other | Admitting: *Deleted

## 2023-09-06 ENCOUNTER — Other Ambulatory Visit (HOSPITAL_COMMUNITY): Payer: Self-pay | Admitting: Psychiatry

## 2023-09-06 VITALS — Ht 62.0 in | Wt 157.0 lb

## 2023-09-06 DIAGNOSIS — Z0000 Encounter for general adult medical examination without abnormal findings: Secondary | ICD-10-CM | POA: Diagnosis not present

## 2023-09-06 DIAGNOSIS — F331 Major depressive disorder, recurrent, moderate: Secondary | ICD-10-CM

## 2023-09-06 NOTE — Patient Instructions (Signed)
 Ms. [name] , Thank you for taking time to come for your Medicare Wellness Visit. I appreciate your ongoing commitment to your health goals. Please review the following plan we discussed and let me know if I can assist you in the future.   Referrals/Orders/Follow-Ups/Clinician Recommendations: Consider updating your covid vaccine.  This is a list of the screening recommended for you and due dates:  Health Maintenance  Topic Date Due   COVID-19 Vaccine (5 - 2024-25 season) 01/03/2023   Mammogram  11/18/2023   Flu Shot  12/03/2023   Medicare Annual Wellness Visit  09/05/2024   DTaP/Tdap/Td vaccine (4 - Td or Tdap) 09/06/2028   Pneumonia Vaccine  Completed   DEXA scan (bone density measurement)  Completed   Hepatitis C Screening  Completed   Zoster (Shingles) Vaccine  Completed   HPV Vaccine  Aged Out   Meningitis B Vaccine  Aged Out   Colon Cancer Screening  Discontinued    Advanced directives: (In Chart) A copy of your advanced directives are scanned into your chart should your provider ever need it.  Next Medicare Annual Wellness Visit scheduled for next year: Yes 09/08/24 @ 9:30  Have you seen your provider in the last 6 months (3 months if uncontrolled diabetes)? No Last office visit 01/26/23 has TOC appointment scheduled 11/11/23

## 2023-09-06 NOTE — Progress Notes (Signed)
 Subjective:   [name] is a 78 y.o. who presents for a Medicare Wellness preventive visit.  Visit Complete: Virtual I connected with  [name] on 09/06/23 by a audio enabled telemedicine application and verified that I am speaking with the correct person using two identifiers.  Patient Location: Home  Provider Location: Office/Clinic  I discussed the limitations of evaluation and management by telemedicine. The patient expressed understanding and agreed to proceed.  Vital Signs: Because this visit was a virtual/telehealth visit, some criteria may be missing or patient reported. Any vitals not documented were not able to be obtained and vitals that have been documented are patient reported.  VideoDeclined- This patient declined Librarian, academic. Therefore the visit was completed with audio only.  Persons Participating in Visit: Patient.  AWV Questionnaire: Yes: Patient Medicare AWV questionnaire was completed by the patient on 09/02/23; I have confirmed that all information answered by patient is correct and no changes since this date.  Cardiac Risk Factors include: advanced age (>76men, >74 women);dyslipidemia     Objective:    Today's Vitals   09/06/23 0935  Weight: 157 lb (71.2 kg)  Height: 5\' 2"  (1.575 m)   Body mass index is 28.72 kg/m.     08/11/2022    2:21 PM 07/10/2022   12:34 PM 07/08/2021   12:51 PM 04/09/2021    3:42 PM 07/05/2020    1:10 PM 02/21/2020   10:52 AM 01/05/2020    1:00 PM  Advanced Directives  Does Patient Have a Medical Advance Directive? Yes Yes Yes Yes Yes Yes Yes  Type of Estate agent of [place];Living will Healthcare Power of [place];Living will  Healthcare Power of [place];Living will Living will;Healthcare Power of [employer] Power of [place];Living will Healthcare Power of Attorney  Does patient want to make changes to medical advance directive? No -  Patient declined   No - Patient declined  No - Patient declined   Copy of Healthcare Power of Attorney in Chart? Yes - validated most recent copy scanned in chart (See row information)   Yes - validated most recent copy scanned in chart (See row information)  No - copy requested Yes - validated most recent copy scanned in chart (See row information)    Current Medications (verified) Outpatient Encounter Medications as of 09/06/2023  Medication Sig   calcium-vitamin D  (OSCAL WITH D) 500-200 MG-UNIT tablet Take 2 tablets by mouth 2 (two) times daily.   cetirizine (ZYRTEC) 10 MG tablet Take 10 mg by mouth daily as needed for allergies.    estradiol  (ESTRACE ) 0.5 MG tablet Take 1 tablet (0.5 mg total) by mouth daily.   lamoTRIgine  (LAMICTAL ) 200 MG tablet Take 1 tablet (200 mg total) by mouth every morning.   levothyroxine  (SYNTHROID ) 25 MCG tablet Take 1 tablet (25 mcg total) by mouth daily before breakfast. 30 minutes   methylphenidate  (RITALIN ) 5 MG tablet Take one tab in am   Multiple Vitamins-Minerals (MULTIVITAMIN WITH MINERALS) tablet Take 1 tablet by mouth daily.   omeprazole  (PRILOSEC) 20 MG capsule TAKE ONE CAPSULE BY MOUTH DAILY 30 MINUTES PRIOR TO LUNCH. MUST WAIT 3-4 HOURS AFTER THYROID  PILL   ondansetron  (ZOFRAN -ODT) 4 MG disintegrating tablet Take 1 tablet (4 mg total) by mouth 2 (two) times daily as needed for nausea or vomiting. AND PRN   Probiotic Product (PROBIOTIC PO) Take by mouth.   traZODone  (DESYREL ) 50 MG tablet Take 1 tablet (50 mg total) by mouth at  bedtime as needed for sleep.   TRINTELLIX  20 MG TABS tablet Take 1 tablet (20 mg total) by mouth every morning.   No facility-administered encounter medications on file as of 09/06/2023.    Allergies (verified) Other and Codeine   History: Past Medical History:  Diagnosis Date   Allergy    Anemia    Carcinoma (HCC)    Colon cancer (HCC) 01/2017   Colon polyps    Diverticulitis    01/2018 abscess hosp. 01/2018    Endometriosis    Fibromyalgia    Gastric polyps    GERD (gastroesophageal reflux disease)    Hemorrhoids    Hypothyroidism    Iron  deficiency anemia 03/08/2017   Major depressive disorder, recurrent episode, mild (HCC)    Dr. Carlos Chesterfield in [place] h/o ECT x 6 x and hosp x 2 and transcranial magnetic treatment    Miscarriage    x2   PONV (postoperative nausea and vomiting)    Skin cancer    arm, thigh and chest Dr. Tresa Frohlich    Sleep apnea    USES CPAP   UTI (urinary tract infection)    Past Surgical History:  Procedure Laterality Date   ABDOMINAL HYSTERECTOMY  1990   endometriosis   BREAST BIOPSY Left 2004   benign   CATARACT EXTRACTION W/PHACO Right 10/26/2018   Procedure: CATARACT EXTRACTION PHACO AND INTRAOCULAR LENS PLACEMENT (IOC) RIGHT TORIC LENS;  Surgeon: Annell Kidney, MD;  Location: [facility];  Service: Ophthalmology;  Laterality: Right;  sleep apnea   CATARACT EXTRACTION, BILATERAL Bilateral 05/2012   CHOLECYSTECTOMY  2001   COLONOSCOPY WITH PROPOFOL  N/A 12/28/2016   Procedure: COLONOSCOPY WITH PROPOFOL ;  Surgeon: Cassie Click, MD;  Location: [facility] END[facility];  Service: Endoscopy;  Laterality: N/A;   COLONOSCOPY WITH PROPOFOL  N/A 01/17/2018   Procedure: COLONOSCOPY WITH PROPOFOL ;  Surgeon: Cassie Click, MD;  Location: [facility];  Service: Endoscopy;  Laterality: N/A;   ESOPHAGOGASTRODUODENOSCOPY (EGD) WITH PROPOFOL  N/A 12/28/2016   Procedure: ESOPHAGOGASTRODUODENOSCOPY (EGD) WITH PROPOFOL ;  Surgeon: Cassie Click, MD;  Location: [facility] ENDOSCOPY;  Service: Endoscopy;  Laterality: N/A;   EYE SURGERY     lens left eye, cataract right eye    LAPAROSCOPIC RIGHT COLECTOMY Right 01/21/2017   Procedure: LAPAROSCOPIC ASSISTED RIGHT COLECTOMY;  Surgeon: Marshall Skeeter, MD;  Location: [facility] ORS;  Service: General;  Laterality: Right;   TONSILLECTOMY     Family History  Problem Relation Age of Onset   Depression Brother    Alcohol abuse Brother     Suicidality Brother    Stroke Mother    Hyperlipidemia Mother    Hypertension Mother    Clotting disorder Brother    Cancer Brother        MDS   Colon polyps Father    Melanoma Sister    Cancer Sister        skin MM   Stroke Maternal Grandfather    Melanoma Nephew    Bipolar disorder Neg Hx    Social History   Socioeconomic History   Marital status: Married    Spouse name: Not on file   Number of children: Not on file   Years of education: Not on file   Highest education level: 12th grade  Occupational History   Not on file  Tobacco Use   Smoking status: Never   Smokeless tobacco: Never  Vaping Use   Vaping status: Never Used  Substance and Sexual Activity   Alcohol use: Not Currently  Alcohol/week: 0.0 standard drinks of alcohol    Comment:     Drug use: No   Sexual activity: Yes    Partners: Male    Birth control/protection: None  Other Topics Concern   Not on file  Social History Narrative   Light smoker age 76 stopped    2 sons    Married    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, housewife    Owns guns, wears seat belt, safe in relationship    Social Drivers of Corporate investment banker Strain: Low Risk  (09/02/2023)   Overall Financial Resource Strain (CARDIA)    Difficulty of Paying Living Expenses: Not hard at all  Food Insecurity: No Food Insecurity (09/02/2023)   Hunger Vital Sign    Worried About Running Out of Food in the Last Year: Never true    Ran Out of Food in the Last Year: Never true  Transportation Needs: No Transportation Needs (09/02/2023)   PRAPARE - Administrator, Civil Service (Medical): No    Lack of Transportation (Non-Medical): No  Physical Activity: Insufficiently Active (09/02/2023)   Exercise Vital Sign    Days of Exercise per Week: 1 day    Minutes of Exercise per Session: 20 min  Stress: No Stress Concern Present (09/02/2023)   [employer] of Occupational Health - Occupational Stress Questionnaire    Feeling of Stress : Not at  all  Social Connections: Moderately Integrated (09/02/2023)   Social Connection and Isolation Panel [NHANES]    Frequency of Communication with Friends and Family: Once a week    Frequency of Social Gatherings with Friends and Family: Once a week    Attends Religious Services: More than 4 times per year    Active Member of [employer] or Organizations: Yes    Attends Engineer, structural: More than 4 times per year    Marital Status: Married    Tobacco Counseling Counseling given: Not Answered    Clinical Intake:  Pre-visit preparation completed: Yes  Pain : No/denies pain     BMI - recorded: 28.72 Nutritional Status: BMI 25 -29 Overweight Nutritional Risks: Nausea/ vomitting/ diarrhea (nausea which is normal for her per patient) Diabetes: No  Lab Results  Component Value Date   HGBA1C 5.3 01/20/2023     How often do you need to have someone help you when you read instructions, pamphlets, or other written materials from your doctor or pharmacy?: 1 - Never  Interpreter Needed?: No  Information entered by :: R. Raheim Beutler LPN   Activities of Daily Living     09/02/2023    8:51 AM  In your present state of health, do you have any difficulty performing the following activities:  Hearing? 0  Vision? 0  Difficulty concentrating or making decisions? 0  Walking or climbing stairs? 0  Dressing or bathing? 0  Doing errands, shopping? 0  Preparing Food and eating ? N  Using the Toilet? N  In the past six months, have you accidently leaked urine? Y  Do you have problems with loss of bowel control? N  Managing your Medications? N  Managing your Finances? N  Housekeeping or managing your Housekeeping? N    Patient Care Team: Valli Gaw, MD as PCP - General (Family Medicine) Cassie Click, MD (Inactive) (Gastroenterology) Marquita Situ, Magali Schmitz, MD (General Surgery) Rochell Chroman, RN as Registered Nurse Timmy Forbes, MD as Consulting Physician (Oncology)  Indicate any  recent Medical Services you may have received from  other than [facility] providers in the past year (date may be approximate).     Assessment:   This is a routine wellness examination for Sally-Anne.  Hearing/Vision screen Hearing Screening - Comments:: No issues Vision Screening - Comments:: readers   Goals Addressed             This Visit's Progress    Patient Stated       Wants to eat better       Depression Screen     09/06/2023    9:39 AM 01/26/2023    1:43 PM 08/11/2022    2:16 PM 07/22/2022   12:57 PM 01/23/2022    9:19 AM 09/01/2021   10:10 AM 06/24/2021    2:52 PM  PHQ 2/9 Scores  PHQ - 2 Score 0 1 0 0 0 0 0  PHQ- 9 Score 4 4    0     Fall Risk     09/02/2023    8:51 AM 01/26/2023    1:43 PM 08/11/2022    2:16 PM 07/22/2022   12:56 PM 01/23/2022    9:19 AM  Fall Risk   Falls in the past year? 0 0 0 0 0  Number falls in past yr: 0 0 0 0 0  Injury with Fall? 0 0 0 0 0  Risk for fall due to : No Fall Risks No Fall Risks No Fall Risks No Fall Risks No Fall Risks  Follow up Falls prevention discussed;Falls evaluation completed Falls evaluation completed  Falls evaluation completed Falls evaluation completed    MEDICARE RISK AT HOME:  Medicare Risk at Home Any stairs in or around the home?: (Patient-Rptd) Yes If so, are there any without handrails?: (Patient-Rptd) No Home free of loose throw rugs in walkways, pet beds, electrical cords, etc?: (Patient-Rptd) Yes Adequate lighting in your home to reduce risk of falls?: (Patient-Rptd) Yes Life alert?: (Patient-Rptd) No Use of a cane, walker or w/c?: (Patient-Rptd) No Grab bars in the bathroom?: (Patient-Rptd) No Shower chair or bench in shower?: (Patient-Rptd) No Elevated toilet seat or a handicapped toilet?: (Patient-Rptd) Yes  TIMED UP AND GO:  Was the test performed?  No  Cognitive Function: 6CIT completed    11/24/2017   10:36 AM  MMSE - Mini Mental State Exam  Orientation to time 5  Orientation to Place 5   Registration 3  Attention/ Calculation 2  Recall 3  Language- name 2 objects 2  Language- repeat 1  Language- follow 3 step command 3  Language- read & follow direction 1  Write a sentence 1  Copy design 1  Total score 27        09/06/2023    9:44 AM 08/11/2022    2:21 PM 02/20/2019   11:16 AM  6CIT Screen  What Year? 0 points 0 points 0 points  What month? 0 points 0 points 0 points  What time? 0 points 0 points 0 points  Count back from 20 0 points 0 points 0 points  Months in reverse 0 points 0 points 0 points  Repeat phrase 4 points 2 points   Total Score 4 points 2 points     Immunizations Immunization History  Administered Date(s) Administered   DTaP 02/15/2014   Fluad Quad(high Dose 65+) 01/12/2020, 01/29/2021, 01/19/2022   Fluad Trivalent(High Dose 65+) 01/26/2023   Influenza Split 03/08/2017   Influenza-Unspecified 02/01/2018, 01/30/2019, 01/19/2022   PFIZER(Purple Top)SARS-COV-2 Vaccination 05/22/2019, 06/13/2019, 01/03/2020, 10/04/2020   Pneumococcal Conjugate-13 02/15/2014   Pneumococcal  Polysaccharide-23 12/25/2009, 05/31/2018   Tdap 08/10/2018, 09/07/2018   Zoster Recombinant(Shingrix) 12/09/2018, 02/15/2019    Screening Tests Health Maintenance  Topic Date Due   COVID-19 Vaccine (5 - 2024-25 season) 01/03/2023   MAMMOGRAM  11/18/2023   INFLUENZA VACCINE  12/03/2023   Medicare Annual Wellness (AWV)  01/26/2024   DTaP/Tdap/Td (4 - Td or Tdap) 09/06/2028   Pneumonia Vaccine 40+ Years old  Completed   DEXA SCAN  Completed   Hepatitis C Screening  Completed   Zoster Vaccines- Shingrix  Completed   HPV VACCINES  Aged Out   Meningococcal B Vaccine  Aged Out   Colonoscopy  Discontinued    Health Maintenance  Health Maintenance Due  Topic Date Due   COVID-19 Vaccine (5 - 2024-25 season) 01/03/2023   Health Maintenance Items Addressed: Discussed the need to update covid vaccine, patient declines. Patient stated that she no longer wants to have a  colonoscopy.   Additional Screening:  Vision Screening: Recommended annual ophthalmology exams for early detection of glaucoma and other disorders of the eye. Up to date [facility]  Dental Screening: Recommended annual dental exams for proper oral hygiene  Community Resource Referral / Chronic Care Management: CRR required this visit?  No   CCM required this visit?  No     Plan:     I have personally reviewed and noted the following in the patient's chart:   Medical and social history Use of alcohol, tobacco or illicit drugs  Current medications and supplements including opioid prescriptions. Patient is not currently taking opioid prescriptions. Functional ability and status Nutritional status Physical activity Advanced directives List of other physicians Hospitalizations, surgeries, and ER visits in previous 12 months Vitals Screenings to include cognitive, depression, and falls Referrals and appointments  In addition, I have reviewed and discussed with patient certain preventive protocols, quality metrics, and best practice recommendations. A written personalized care plan for preventive services as well as general preventive health recommendations were provided to patient.     Felicitas Horse, LPN   09/08/8467   After Visit Summary: (MyChart) Due to this being a telephonic visit, the after visit summary with patients personalized plan was offered to patient via MyChart   Notes: Nothing significant to report at this time.

## 2023-09-28 ENCOUNTER — Telehealth (HOSPITAL_COMMUNITY): Payer: Self-pay | Admitting: *Deleted

## 2023-09-28 DIAGNOSIS — F900 Attention-deficit hyperactivity disorder, predominantly inattentive type: Secondary | ICD-10-CM

## 2023-09-28 MED ORDER — METHYLPHENIDATE HCL 5 MG PO TABS: ORAL_TABLET | ORAL | 0 refills | Status: DC

## 2023-09-28 NOTE — Telephone Encounter (Signed)
 Pt called to request a refill of the Methylphenidate  5 mg #30 tabs. Medication last e-scribed during last visit on 08/18/23. Pt has f/u scheduled for 11/19/23.

## 2023-09-28 NOTE — Telephone Encounter (Signed)
 Done

## 2023-10-29 ENCOUNTER — Telehealth (HOSPITAL_COMMUNITY): Payer: Self-pay

## 2023-10-29 NOTE — Telephone Encounter (Signed)
 Patient called to request a refill of methylphenidate  (RITALIN ) 5 MG tablet    Last visit 08-18-23 Next visit 11-19-23   Preferred pharmacy [facility] [id number] - [place], [place] - [address] Phone: [phone]  Fax: [phone]

## 2023-11-01 ENCOUNTER — Telehealth (HOSPITAL_COMMUNITY): Payer: Self-pay | Admitting: *Deleted

## 2023-11-01 DIAGNOSIS — F900 Attention-deficit hyperactivity disorder, predominantly inattentive type: Secondary | ICD-10-CM

## 2023-11-01 MED ORDER — METHYLPHENIDATE HCL 5 MG PO TABS: ORAL_TABLET | ORAL | 0 refills | Status: DC

## 2023-11-01 NOTE — Telephone Encounter (Signed)
 Pt called requesting a refill of generic Ritalin  5 mg every day. Last e-scribed on on 09/28/23.   Last visit: 08/18/23 Next visit: 11/19/23

## 2023-11-11 ENCOUNTER — Ambulatory Visit: Admitting: Nurse Practitioner

## 2023-11-11 VITALS — BP 124/78 | HR 73 | Temp 98.2°F | Ht 62.0 in | Wt 158.4 lb

## 2023-11-11 DIAGNOSIS — G4733 Obstructive sleep apnea (adult) (pediatric): Secondary | ICD-10-CM | POA: Diagnosis not present

## 2023-11-11 DIAGNOSIS — R112 Nausea with vomiting, unspecified: Secondary | ICD-10-CM | POA: Diagnosis not present

## 2023-11-11 DIAGNOSIS — F39 Unspecified mood [affective] disorder: Secondary | ICD-10-CM | POA: Diagnosis not present

## 2023-11-11 DIAGNOSIS — E559 Vitamin D deficiency, unspecified: Secondary | ICD-10-CM

## 2023-11-11 DIAGNOSIS — N959 Unspecified menopausal and perimenopausal disorder: Secondary | ICD-10-CM | POA: Diagnosis not present

## 2023-11-11 DIAGNOSIS — E785 Hyperlipidemia, unspecified: Secondary | ICD-10-CM

## 2023-11-11 DIAGNOSIS — K2270 Barrett's esophagus without dysplasia: Secondary | ICD-10-CM

## 2023-11-11 DIAGNOSIS — E039 Hypothyroidism, unspecified: Secondary | ICD-10-CM | POA: Diagnosis not present

## 2023-11-11 DIAGNOSIS — K219 Gastro-esophageal reflux disease without esophagitis: Secondary | ICD-10-CM

## 2023-11-11 LAB — LIPID PANEL
Cholesterol: 210 mg/dL — ABNORMAL HIGH (ref 0–200)
HDL: 75.6 mg/dL (ref >39.00)
LDL Cholesterol: 108 mg/dL — ABNORMAL HIGH (ref 0–99)
NonHDL: 134.23
Total CHOL/HDL Ratio: 3
Triglycerides: 131 mg/dL (ref 0.0–149.0)
VLDL: 26.2 mg/dL (ref 0.0–40.0)

## 2023-11-11 LAB — COMPREHENSIVE METABOLIC PANEL WITH GFR
ALT: 14 U/L (ref 0–35)
AST: 14 U/L (ref 0–37)
Albumin: 4.3 g/dL (ref 3.5–5.2)
Alkaline Phosphatase: 70 U/L (ref 39–117)
BUN: 10 mg/dL (ref 6–23)
CO2: 33 meq/L — ABNORMAL HIGH (ref 19–32)
Calcium: 9.4 mg/dL (ref 8.4–10.5)
Chloride: 100 meq/L (ref 96–112)
Creatinine, Ser: 1.11 mg/dL (ref 0.40–1.20)
GFR: 48.3 mL/min — ABNORMAL LOW (ref >60.00)
Glucose, Bld: 85 mg/dL (ref 70–99)
Potassium: 4.5 meq/L (ref 3.5–5.1)
Sodium: 138 meq/L (ref 135–145)
Total Bilirubin: 0.4 mg/dL (ref 0.2–1.2)
Total Protein: 6.7 g/dL (ref 6.0–8.3)

## 2023-11-11 LAB — VITAMIN D 25 HYDROXY (VIT D DEFICIENCY, FRACTURES): VITD: 26.56 ng/mL — ABNORMAL LOW (ref 30.00–100.00)

## 2023-11-11 LAB — TSH: TSH: 2.06 u[IU]/mL (ref 0.35–5.50)

## 2023-11-11 MED ORDER — OMEPRAZOLE 20 MG PO CPDR: DELAYED_RELEASE_CAPSULE | ORAL | 3 refills | Status: AC

## 2023-11-11 MED ORDER — ESTRADIOL 0.5 MG PO TABS: 0.5000 mg | ORAL_TABLET | Freq: Every day | ORAL | 3 refills | Status: AC

## 2023-11-11 MED ORDER — ONDANSETRON 4 MG PO TBDP: 4.0000 mg | ORAL_TABLET | Freq: Two times a day (BID) | ORAL | 3 refills | Status: AC | PRN

## 2023-11-11 MED ORDER — LEVOTHYROXINE SODIUM 25 MCG PO TABS: 25.0000 ug | ORAL_TABLET | Freq: Every day | ORAL | 3 refills | Status: AC

## 2023-11-11 NOTE — Progress Notes (Signed)
 Emily Glance, NP-C Phone: [phone]  [name] is a 78 y.o. female who presents today for transfer of care.   Discussed the use of AI scribe software for clinical note transcription with the patient, who gave verbal consent to proceed.  History of Present Illness   [name] is a 78 year old female who presents for a transfer of care.  She is currently under the care of hematology/oncology and psychiatry, and is on several medications including Lamictal , Ritalin , trazodone , Trintellix , and levothyroxine . She has no new problems or concerns at this time.  She has a history of sleep apnea and uses a CPAP machine every night. She feels well-rested most of the time. However, she experiences ongoing tiredness, which is managed with Ritalin  in the morning and afternoon.  She takes omeprazole  daily for acid reflux, and her symptoms are managed. She has no blood in her stool or trouble swallowing.  She takes Aleve every morning on an empty stomach and supplements with vitamin D , calcium, and a multivitamin.  She has a history of borderline high cholesterol but is not currently on any cholesterol medication. She acknowledges that her diet has not been as good as it has been in the past.  No issues with heart palpitations, temperature regulation, or significant hair, skin, or nail problems.      Social History   Tobacco Use  Smoking Status Never  Smokeless Tobacco Never    Current Outpatient Medications on File Prior to Visit  Medication Sig Dispense Refill   calcium-vitamin D  (OSCAL WITH D) 500-200 MG-UNIT tablet Take 2 tablets by mouth 2 (two) times daily.     cetirizine (ZYRTEC) 10 MG tablet Take 10 mg by mouth daily as needed for allergies.      lamoTRIgine  (LAMICTAL ) 200 MG tablet Take 1 tablet (200 mg total) by mouth every morning. 30 tablet 2   methylphenidate  (RITALIN ) 5 MG tablet Take one tab in am 30 tablet 0   Multiple Vitamins-Minerals  (MULTIVITAMIN WITH MINERALS) tablet Take 1 tablet by mouth daily.     Probiotic Product (PROBIOTIC PO) Take by mouth.     traZODone  (DESYREL ) 50 MG tablet Take 1 tablet (50 mg total) by mouth at bedtime as needed for sleep. 90 tablet 0   TRINTELLIX  20 MG TABS tablet Take 1 tablet (20 mg total) by mouth every morning. 30 tablet 2   No current facility-administered medications on file prior to visit.     ROS see history of present illness  Objective  Physical Exam Vitals:   11/11/23 1122  BP: 124/78  Pulse: 73  Temp: 98.2 F (36.8 C)  SpO2: 97%    BP Readings from Last 3 Encounters:  11/11/23 124/78  07/19/23 137/60  01/26/23 122/64   Wt Readings from Last 3 Encounters:  11/11/23 158 lb 6.4 oz (71.8 kg)  09/06/23 157 lb (71.2 kg)  07/19/23 162 lb 4.8 oz (73.6 kg)    Physical Exam Constitutional:      General: She is not in acute distress.    Appearance: Normal appearance.  HENT:     Head: Normocephalic.  Cardiovascular:     Rate and Rhythm: Normal rate and regular rhythm.     Heart sounds: Normal heart sounds.  Pulmonary:     Effort: Pulmonary effort is normal.     Breath sounds: Normal breath sounds.  Skin:    General: Skin is warm and dry.  Neurological:     General: No focal  deficit present.     Mental Status: She is alert.  Psychiatric:        Mood and Affect: Mood normal.        Behavior: Behavior normal.      Assessment/Plan: Please see individual problem list.   Mood disorder (HCC) Assessment & Plan: Mood is stable on Lamictal , Ritalin , Trintellix  and Trazodone . Continue current regimen. Follow up with Psychiatry as scheduled.    Hypothyroidism, unspecified type Assessment & Plan: She is on levothyroxine  without symptoms or hypo- or hyperthyroid. Continue levothyroxine . Check TSH.   Orders: -     Levothyroxine  Sodium; Take 1 tablet (25 mcg total) by mouth daily before breakfast. 30 minutes  Dispense: 90 tablet; Refill: 3 -      TSH  Barrett's esophagus without dysplasia Assessment & Plan: Omeprazole  effectively manages her symptoms. Continue omeprazole  daily. Repeat EGD due. Encourage follow up with GI.   Orders: -     Omeprazole ; TAKE ONE CAPSULE BY MOUTH DAILY 30 MINUTES PRIOR TO LUNCH. MUST WAIT 3-4 HOURS AFTER THYROID  PILL  Dispense: 90 capsule; Refill: 3  Hyperlipidemia, unspecified hyperlipidemia type Assessment & Plan: She acknowledges the condition and prefers lifestyle management but is open to medication if necessary. Check cholesterol levels today. Encourage dietary modifications and exercise.   Orders: -     Comprehensive metabolic panel with GFR -     Lipid panel  OSA on CPAP Assessment & Plan: She uses CPAP nightly, feels well-rested, and manages afternoon tiredness with Ritalin  through Psychiatry. Continue CPAP use nightly.   Vitamin D  deficiency -     VITAMIN D  25 Hydroxy (Vit-D Deficiency, Fractures)  Unspecified menopausal and perimenopausal disorder -     Estradiol ; Take 1 tablet (0.5 mg total) by mouth daily.  Dispense: 90 tablet; Refill: 3  Nausea and vomiting in adult -     Ondansetron ; Take 1 tablet (4 mg total) by mouth 2 (two) times daily as needed for nausea or vomiting.  Dispense: 180 tablet; Refill: 3     Return in about 1 year (around 11/10/2024) for Follow up, sooner as needed.   Emily Glance, NP-C [facility]

## 2023-11-12 ENCOUNTER — Ambulatory Visit: Payer: Self-pay | Admitting: Nurse Practitioner

## 2023-11-12 ENCOUNTER — Other Ambulatory Visit (HOSPITAL_COMMUNITY): Payer: Self-pay | Admitting: Psychiatry

## 2023-11-12 DIAGNOSIS — R944 Abnormal results of kidney function studies: Secondary | ICD-10-CM

## 2023-11-12 DIAGNOSIS — F331 Major depressive disorder, recurrent, moderate: Secondary | ICD-10-CM

## 2023-11-17 ENCOUNTER — Encounter: Payer: Self-pay | Admitting: Nurse Practitioner

## 2023-11-17 ENCOUNTER — Other Ambulatory Visit: Payer: Self-pay | Admitting: Nurse Practitioner

## 2023-11-17 ENCOUNTER — Other Ambulatory Visit (INDEPENDENT_AMBULATORY_CARE_PROVIDER_SITE_OTHER)

## 2023-11-17 DIAGNOSIS — Z1231 Encounter for screening mammogram for malignant neoplasm of breast: Secondary | ICD-10-CM

## 2023-11-17 DIAGNOSIS — R944 Abnormal results of kidney function studies: Secondary | ICD-10-CM

## 2023-11-17 LAB — BASIC METABOLIC PANEL WITH GFR
BUN: 12 mg/dL (ref 6–23)
CO2: 34 meq/L — ABNORMAL HIGH (ref 19–32)
Calcium: 10.3 mg/dL (ref 8.4–10.5)
Chloride: 99 meq/L (ref 96–112)
Creatinine, Ser: 1.16 mg/dL (ref 0.40–1.20)
GFR: 45.81 mL/min — ABNORMAL LOW (ref >60.00)
Glucose, Bld: 109 mg/dL — ABNORMAL HIGH (ref 70–99)
Potassium: 4.4 meq/L (ref 3.5–5.1)
Sodium: 139 meq/L (ref 135–145)

## 2023-11-17 NOTE — Assessment & Plan Note (Signed)
 Mood is stable on Lamictal , Ritalin , Trintellix  and Trazodone . Continue current regimen. Follow up with Psychiatry as scheduled.

## 2023-11-17 NOTE — Assessment & Plan Note (Signed)
 Omeprazole  effectively manages her symptoms. Continue omeprazole  daily. Repeat EGD due. Encourage follow up with GI.

## 2023-11-17 NOTE — Assessment & Plan Note (Addendum)
 She acknowledges the condition and prefers lifestyle management but is open to medication if necessary. Check cholesterol levels today. Encourage dietary modifications and exercise.

## 2023-11-17 NOTE — Assessment & Plan Note (Addendum)
 She uses CPAP nightly, feels well-rested, and manages afternoon tiredness with Ritalin  through Psychiatry. Continue CPAP use nightly.

## 2023-11-17 NOTE — Assessment & Plan Note (Signed)
 She is on levothyroxine  without symptoms or hypo- or hyperthyroid. Continue levothyroxine . Check TSH.

## 2023-11-18 ENCOUNTER — Ambulatory Visit: Payer: Self-pay | Admitting: Nurse Practitioner

## 2023-11-19 ENCOUNTER — Telehealth (HOSPITAL_BASED_OUTPATIENT_CLINIC_OR_DEPARTMENT_OTHER): Admitting: Psychiatry

## 2023-11-19 ENCOUNTER — Encounter (HOSPITAL_COMMUNITY): Payer: Self-pay | Admitting: Psychiatry

## 2023-11-19 DIAGNOSIS — F331 Major depressive disorder, recurrent, moderate: Secondary | ICD-10-CM

## 2023-11-19 DIAGNOSIS — F900 Attention-deficit hyperactivity disorder, predominantly inattentive type: Secondary | ICD-10-CM | POA: Diagnosis not present

## 2023-11-19 MED ORDER — METHYLPHENIDATE HCL 5 MG PO TABS: ORAL_TABLET | ORAL | 0 refills | Status: DC

## 2023-11-19 MED ORDER — TRINTELLIX 20 MG PO TABS: 20.0000 mg | ORAL_TABLET | ORAL | 2 refills | Status: DC

## 2023-11-19 MED ORDER — LAMOTRIGINE 200 MG PO TABS: 200.0000 mg | ORAL_TABLET | ORAL | 2 refills | Status: DC

## 2023-11-19 MED ORDER — TRAZODONE HCL 50 MG PO TABS: 50.0000 mg | ORAL_TABLET | Freq: Every evening | ORAL | 0 refills | Status: DC | PRN

## 2023-11-19 NOTE — Progress Notes (Signed)
 [facility] MD Virtual Progress Note   Patient Location: Home Provider Location: Home Office  I connect with patient by video and verified that I am speaking with correct person by using two identifiers. I discussed the limitations of evaluation and management by telemedicine and the availability of in person appointments. I also discussed with the patient that there may be a patient responsible charge related to this service. The patient expressed understanding and agreed to proceed.  [name] [id number] 78 y.o.  11/19/2023 10:21 AM  History of Present Illness:  Patient is evaluated by video session.  She is doing very well and taking all her medication as prescribed.  She recently back from [place]  4 weeks ago after a daily family reunion.  She was able to see the kids but one of his son could not come live in [place] .  She reported medicine is working and she is able to do her task on time.  Her attention, focus and concentration is good.  She denies any crying spells, paranoia, suicidal thoughts or homicidal thoughts.  Her weight is stable.  Recently she had a blood work and labs are stable.  She is working as a Agricultural consultant as a Nurse, mental health at [employer] and she likes her job.  She denies any tremors, shakes or any EPS.  She denies any palpitation, insomnia.  She like to keep the current medication.  She denies drinking or using any illegal substances.  She lives with her husband.  Past Psychiatric History: H/O mania, irritability, impulsive behavior and overdose at age 76. H/O inpatient twice. Had psychological testing and diagnosed ADD. Took Concerta , (Adderall, Vyvanse  made anxious) Wellbutrin, Prozac, lithium, Effexor , Lexapro, Abilify and Cymbalta .  Best when given female hormone to help endometriosis.  Had ECT, did not work. TMS helped. No history of psychosis.     Outpatient Encounter Medications as of 11/19/2023  Medication Sig   calcium-vitamin D   (OSCAL WITH D) 500-200 MG-UNIT tablet Take 2 tablets by mouth 2 (two) times daily.   cetirizine (ZYRTEC) 10 MG tablet Take 10 mg by mouth daily as needed for allergies.    estradiol  (ESTRACE ) 0.5 MG tablet Take 1 tablet (0.5 mg total) by mouth daily.   lamoTRIgine  (LAMICTAL ) 200 MG tablet Take 1 tablet (200 mg total) by mouth every morning.   levothyroxine  (SYNTHROID ) 25 MCG tablet Take 1 tablet (25 mcg total) by mouth daily before breakfast. 30 minutes   methylphenidate  (RITALIN ) 5 MG tablet Take one tab in am   Multiple Vitamins-Minerals (MULTIVITAMIN WITH MINERALS) tablet Take 1 tablet by mouth daily.   omeprazole  (PRILOSEC) 20 MG capsule TAKE ONE CAPSULE BY MOUTH DAILY 30 MINUTES PRIOR TO LUNCH. MUST WAIT 3-4 HOURS AFTER THYROID  PILL   ondansetron  (ZOFRAN -ODT) 4 MG disintegrating tablet Take 1 tablet (4 mg total) by mouth 2 (two) times daily as needed for nausea or vomiting.   Probiotic Product (PROBIOTIC PO) Take by mouth.   traZODone  (DESYREL ) 50 MG tablet Take 1 tablet (50 mg total) by mouth at bedtime as needed for sleep.   TRINTELLIX  20 MG TABS tablet Take 1 tablet (20 mg total) by mouth every morning.   No facility-administered encounter medications on file as of 11/19/2023.    Recent Results (from the past 2160 hours)  Comprehensive metabolic panel with GFR     Status: Abnormal   Collection Time: 11/11/23 11:39 AM  Result Value Ref Range   Sodium 138 135 - 145 mEq/L   Potassium 4.5  3.5 - 5.1 mEq/L   Chloride 100 96 - 112 mEq/L   CO2 33 (H) 19 - 32 mEq/L   Glucose, Bld 85 70 - 99 mg/dL   BUN 10 6 - 23 mg/dL   Creatinine, Ser 8.88 0.40 - 1.20 mg/dL   Total Bilirubin 0.4 0.2 - 1.2 mg/dL   Alkaline Phosphatase 70 39 - 117 U/L   AST 14 0 - 37 U/L   ALT 14 0 - 35 U/L   Total Protein 6.7 6.0 - 8.3 g/dL   Albumin 4.3 3.5 - 5.2 g/dL   GFR 51.69 (L) >39.99 mL/min    Comment: Calculated using the CKD-EPI Creatinine Equation (2021)   Calcium 9.4 8.4 - 10.5 mg/dL  Lipid panel      Status: Abnormal   Collection Time: 11/11/23 11:39 AM  Result Value Ref Range   Cholesterol 210 (H) 0 - 200 mg/dL    Comment: ATP III Classification       Desirable:  < 200 mg/dL               Borderline High:  200 - 239 mg/dL          High:  > = 759 mg/dL   Triglycerides 868.9 0.0 - 149.0 mg/dL    Comment: Normal:  <849 mg/dLBorderline High:  150 - 199 mg/dL   HDL 24.39 >60.99 mg/dL   VLDL 73.7 0.0 - 59.9 mg/dL   LDL Cholesterol 891 (H) 0 - 99 mg/dL   Total CHOL/HDL Ratio 3     Comment:                Men          Women1/2 Average Risk     3.4          3.3Average Risk          5.0          4.42X Average Risk          9.6          7.13X Average Risk          15.0          11.0                       NonHDL 134.23     Comment: NOTE:  Non-HDL goal should be 30 mg/dL higher than patient's LDL goal (i.e. LDL goal of < 70 mg/dL, would have non-HDL goal of < 100 mg/dL)  TSH     Status: None   Collection Time: 11/11/23 11:39 AM  Result Value Ref Range   TSH 2.06 0.35 - 5.50 uIU/mL  VITAMIN D  25 Hydroxy (Vit-D Deficiency, Fractures)     Status: Abnormal   Collection Time: 11/11/23 11:39 AM  Result Value Ref Range   VITD 26.56 (L) 30.00 - 100.00 ng/mL  Basic metabolic panel with GFR     Status: Abnormal   Collection Time: 11/17/23  9:46 AM  Result Value Ref Range   Sodium 139 135 - 145 mEq/L   Potassium 4.4 3.5 - 5.1 mEq/L   Chloride 99 96 - 112 mEq/L   CO2 34 (H) 19 - 32 mEq/L   Glucose, Bld 109 (H) 70 - 99 mg/dL   BUN 12 6 - 23 mg/dL   Creatinine, Ser 8.83 0.40 - 1.20 mg/dL   GFR 54.18 (L) >39.99 mL/min    Comment: Calculated using the CKD-EPI Creatinine Equation (2021)   Calcium  10.3 8.4 - 10.5 mg/dL     Psychiatric Specialty Exam: Physical Exam  Review of Systems  Weight 157 lb (71.2 kg).There is no height or weight on file to calculate BMI.  General Appearance: Casual  Eye Contact:  Good  Speech:  Clear and Coherent and Normal Rate  Volume:  Normal  Mood:  Euthymic  Affect:   Appropriate  Thought Process:  Goal Directed  Orientation:  Full (Time, Place, and Person)  Thought Content:  WDL  Suicidal Thoughts:  No  Homicidal Thoughts:  No  Memory:  Immediate;   Good Recent;   Good Remote;   Good  Judgement:  Good  Insight:  Good  Psychomotor Activity:  Normal  Concentration:  Concentration: Good and Attention Span: Good  Recall:  Good  Fund of Knowledge:  Good  Language:  Good  Akathisia:  No  Handed:  Right  AIMS (if indicated):     Assets:  Communication Skills Desire for Improvement Housing Resilience Social Support Transportation  ADL's:  Intact  Cognition:  WNL  Sleep:  ok       11/11/2023   11:24 AM 09/06/2023    9:39 AM 01/26/2023    1:43 PM 08/11/2022    2:16 PM 07/22/2022   12:57 PM  Depression screen PHQ 2/9  Decreased Interest 1 0 1 0 0  Down, Depressed, Hopeless 1 0 0 0 0  PHQ - 2 Score 2 0 1 0 0  Altered sleeping 0 2 1    Tired, decreased energy 1 1 1     Change in appetite 2 1 1     Feeling bad or failure about yourself  0 0 0    Trouble concentrating 1 0 0    Moving slowly or fidgety/restless 0 0 0    Suicidal thoughts 0 0 0    PHQ-9 Score 6 4 4     Difficult doing work/chores Not difficult at all Somewhat difficult Not difficult at all      Assessment/Plan: Moderate episode of recurrent major depressive disorder (HCC) - Plan: lamoTRIgine  (LAMICTAL ) 200 MG tablet, traZODone  (DESYREL ) 50 MG tablet, TRINTELLIX  20 MG TABS tablet  Attention deficit hyperactivity disorder (ADHD), predominantly inattentive type - Plan: methylphenidate  (RITALIN ) 5 MG tablet  Patient is stable on current medication.  No new issues.  She had a good time in [place] .  She has no rash or any itching or any concern from the current meds.  Continue Lamictal  200 mg daily, Trintellix  20 mg daily, trazodone  50 mg at bedtime and Ritalin  5 mg every day.  Recommended to call us  back if she is any question, concern of 50 worsening of the symptoms.  Discussed stimulant  abuse, tolerance and dependency.  Patient does not ask early refills.  Will follow-up in 3 months.   Follow Up Instructions:     I discussed the assessment and treatment plan with the patient. The patient was provided an opportunity to ask questions and all were answered. The patient agreed with the plan and demonstrated an understanding of the instructions.   The patient was advised to call back or seek an in-person evaluation if the symptoms worsen or if the condition fails to improve as anticipated.    Collaboration of Care: Other provider involved in patient's care AEB notes are available in epic to review  Patient/Guardian was advised Release of Information must be obtained prior to any record release in order to collaborate their care with an outside provider. Patient/Guardian was advised if  they have not already done so to contact the registration department to sign all necessary forms in order for us  to release information regarding their care.   Consent: Patient/Guardian gives verbal consent for treatment and assignment of benefits for services provided during this visit. Patient/Guardian expressed understanding and agreed to proceed.     Total encounter time 18 minutes which includes face-to-face time, chart reviewed, care coordination, order entry and documentation during this encounter.   Note: This document was prepared by [employer] voice dictation technology and any errors that results from this process are unintentional.    Leni ONEIDA Client, MD 11/19/2023

## 2023-12-01 ENCOUNTER — Ambulatory Visit
Admission: RE | Admit: 2023-12-01 | Discharge: 2023-12-01 | Disposition: A | Discharge status: Home / Self Care | Source: Ambulatory Visit | Attending: Nurse Practitioner | Admitting: Nurse Practitioner

## 2023-12-01 DIAGNOSIS — Z1231 Encounter for screening mammogram for malignant neoplasm of breast: Secondary | ICD-10-CM | POA: Insufficient documentation

## 2023-12-03 ENCOUNTER — Ambulatory Visit: Payer: Self-pay | Admitting: Nurse Practitioner

## 2023-12-06 ENCOUNTER — Telehealth: Payer: Self-pay

## 2023-12-06 NOTE — Telephone Encounter (Signed)
 Copied from CRM [id number]. Topic: Clinical - Lab/Test Results >> Dec 06, 2023 11:00 AM Frederich PARAS wrote: Reason for CRM: PT Calling to schedule her 72mo labs that were requested by pcp. She is looking to schedule in January . Please call pt to schedule apt

## 2023-12-07 ENCOUNTER — Other Ambulatory Visit: Payer: Self-pay | Admitting: Nurse Practitioner

## 2023-12-07 DIAGNOSIS — E785 Hyperlipidemia, unspecified: Secondary | ICD-10-CM

## 2023-12-28 ENCOUNTER — Ambulatory Visit (INDEPENDENT_AMBULATORY_CARE_PROVIDER_SITE_OTHER): Admitting: Family

## 2023-12-28 ENCOUNTER — Encounter: Payer: Self-pay | Admitting: Family

## 2023-12-28 VITALS — BP 124/70 | HR 72 | Temp 98.4°F | Resp 20 | Ht 62.0 in | Wt 157.4 lb

## 2023-12-28 DIAGNOSIS — M25841 Other specified joint disorders, right hand: Secondary | ICD-10-CM | POA: Diagnosis not present

## 2023-12-28 DIAGNOSIS — M67441 Ganglion, right hand: Secondary | ICD-10-CM | POA: Diagnosis not present

## 2023-12-29 NOTE — Progress Notes (Signed)
 Acute Office Visit  Subjective:     Patient ID: [name], female    DOB: Apr 24, 1946, 78 y.o.   MRN: [id number]  Chief Complaint  Patient presents with   Cyst    On Right hand hurts when she bends finger X couple months    HPI Patient is in today with c/o a cyst to the dorsal right hand x a few months that has become uncomfortable and burns at times with movement. She is seeking to have it removed.  Review of Systems  Skin:        Cyst to the right hand  All other systems reviewed and are negative.   Past Medical History:  Diagnosis Date   Allergy    Anemia 03/08/2017   Carcinoma (HCC)    Cataract    Both removed   Colon cancer (HCC) 01/2017   Colon polyps    Diverticulitis    01/2018 abscess hosp. 01/2018   Endometriosis    Fibromyalgia    Gastric polyps    GERD (gastroesophageal reflux disease)    Hemorrhoids    Hypothyroidism    Iron  deficiency anemia 03/08/2017   Major depressive disorder, recurrent episode, mild (HCC)    Dr. Curry in [place] h/o ECT x 6 x and hosp x 2 and transcranial magnetic treatment    Miscarriage    x2   PONV (postoperative nausea and vomiting)    Skin cancer    arm, thigh and chest Dr. Dela    Sleep apnea    USES CPAP   UTI (urinary tract infection)     Social History   Socioeconomic History   Marital status: Married    Spouse name: Not on file   Number of children: Not on file   Years of education: Not on file   Highest education level: Some college, no degree  Occupational History   Not on file  Tobacco Use   Smoking status: Never   Smokeless tobacco: Never  Vaping Use   Vaping status: Never Used  Substance and Sexual Activity   Alcohol use: Not Currently    Alcohol/week: 0.0 standard drinks of alcohol    Comment:     Drug use: No   Sexual activity: Yes    Partners: Male    Birth control/protection: None  Other Topics Concern   Not on file  Social History Narrative   Light smoker age 62 stopped     2 sons    Married    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, housewife    Owns guns, wears seat belt, safe in relationship    Social Drivers of Corporate investment banker Strain: Low Risk  (11/11/2023)   Overall Financial Resource Strain (CARDIA)    Difficulty of Paying Living Expenses: Not hard at all  Food Insecurity: No Food Insecurity (11/11/2023)   Hunger Vital Sign    Worried About Running Out of Food in the Last Year: Never true    Ran Out of Food in the Last Year: Never true  Transportation Needs: No Transportation Needs (11/11/2023)   PRAPARE - Administrator, Civil Service (Medical): No    Lack of Transportation (Non-Medical): No  Physical Activity: Inactive (11/11/2023)   Exercise Vital Sign    Days of Exercise per Week: 0 days    Minutes of Exercise per Session: Not on file  Stress: No Stress Concern Present (11/11/2023)   [employer] of Occupational Health - Occupational Stress Questionnaire  Feeling of Stress: Not at all  Social Connections: Moderately Integrated (11/11/2023)   Social Connection and Isolation Panel    Frequency of Communication with Friends and Family: Once a week    Frequency of Social Gatherings with Friends and Family: Once a week    Attends Religious Services: More than 4 times per year    Active Member of [employer] or Organizations: Yes    Attends Engineer, structural: More than 4 times per year    Marital Status: Married  Catering manager Violence: Not At Risk (09/06/2023)   Humiliation, Afraid, Rape, and Kick questionnaire    Fear of Current or Ex-Partner: No    Emotionally Abused: No    Physically Abused: No    Sexually Abused: No    Past Surgical History:  Procedure Laterality Date   ABDOMINAL HYSTERECTOMY  1990   endometriosis   BREAST BIOPSY Left 2004   benign   CATARACT EXTRACTION W/PHACO Right 10/26/2018   Procedure: CATARACT EXTRACTION PHACO AND INTRAOCULAR LENS PLACEMENT (IOC) RIGHT TORIC LENS;  Surgeon: Mittie Gaskin, MD;   Location: [facility];  Service: Ophthalmology;  Laterality: Right;  sleep apnea   CATARACT EXTRACTION, BILATERAL Bilateral 05/2012   CHOLECYSTECTOMY  2001   COLON SURGERY  2018   COLONOSCOPY WITH PROPOFOL  N/A 12/28/2016   Procedure: COLONOSCOPY WITH PROPOFOL ;  Surgeon: Viktoria Lamar DASEN, MD;  Location[facility];  Service: Endoscopy;  Laterality: N/A;   COLONOSCOPY WITH PROPOFOL  N/A 01/17/2018   Procedure: COLONOSCOPY WITH PROPOFOL ;  Surgeon: Viktoria Lamar DASEN, MD;  Location: [facility] ENDOSCOPY;  Service: Endoscopy;  Laterality: N/A;   ESOPHAGOGASTRODUODENOSCOPY (EGD) WITH PROPOFOL  N/A 12/28/2016   Procedure: ESOPHAGOGASTRODUODENOSCOPY (EGD) WITH PROPOFOL ;  Surgeon: Viktoria Lamar DASEN, MD;  Location: [facility] ENDOSCOPY;  Service: Endoscopy;  Laterality: N/A;   EYE SURGERY     lens left eye, cataract right eye    LAPAROSCOPIC RIGHT COLECTOMY Right 01/21/2017   Procedure: LAPAROSCOPIC ASSISTED RIGHT COLECTOMY;  Surgeon: Dessa Reyes ORN, MD;  Location: [facility] ORS;  Service: General;  Laterality: Right;   TONSILLECTOMY      Family History  Problem Relation Age of Onset   Stroke Mother    Hyperlipidemia Mother    Hypertension Mother    Colon polyps Father    Melanoma Sister    Cancer Sister        skin MM   Stroke Maternal Grandfather    Depression Brother    Alcohol abuse Brother    Suicidality Brother    Clotting disorder Brother    Cancer Brother        MDS   Melanoma Nephew    Bipolar disorder Neg Hx    Breast cancer Neg Hx     Allergies  Allergen Reactions   Other     BAND-AIDS IF LEFT ON FOR EXTENDED PERIODS-REDNESS   Codeine     Nausea    Current Outpatient Medications on File Prior to Visit  Medication Sig Dispense Refill   calcium-vitamin D  (OSCAL WITH D) 500-200 MG-UNIT tablet Take 2 tablets by mouth 2 (two) times daily.     cetirizine (ZYRTEC) 10 MG tablet Take 10 mg by mouth daily as needed for allergies.      estradiol  (ESTRACE ) 0.5 MG tablet Take 1  tablet (0.5 mg total) by mouth daily. 90 tablet 3   lamoTRIgine  (LAMICTAL ) 200 MG tablet Take 1 tablet (200 mg total) by mouth every morning. 30 tablet 2   levothyroxine  (SYNTHROID ) 25 MCG tablet Take  1 tablet (25 mcg total) by mouth daily before breakfast. 30 minutes 90 tablet 3   methylphenidate  (RITALIN ) 5 MG tablet Take one tab in am 30 tablet 0   Multiple Vitamins-Minerals (MULTIVITAMIN WITH MINERALS) tablet Take 1 tablet by mouth daily.     omeprazole  (PRILOSEC) 20 MG capsule TAKE ONE CAPSULE BY MOUTH DAILY 30 MINUTES PRIOR TO LUNCH. MUST WAIT 3-4 HOURS AFTER THYROID  PILL 90 capsule 3   ondansetron  (ZOFRAN -ODT) 4 MG disintegrating tablet Take 1 tablet (4 mg total) by mouth 2 (two) times daily as needed for nausea or vomiting. 180 tablet 3   Probiotic Product (PROBIOTIC PO) Take by mouth.     traZODone  (DESYREL ) 50 MG tablet Take 1 tablet (50 mg total) by mouth at bedtime as needed for sleep. 90 tablet 0   TRINTELLIX  20 MG TABS tablet Take 1 tablet (20 mg total) by mouth every morning. 30 tablet 2   No current facility-administered medications on file prior to visit.    BP 124/70   Pulse 72   Temp 98.4 F (36.9 C)   Resp 20   Ht 5' 2 (1.575 m)   Wt 157 lb 6 oz (71.4 kg)   LMP  (LMP Unknown)   SpO2 95%   BMI 28.78 kg/m chart     Objective:    BP 124/70   Pulse 72   Temp 98.4 F (36.9 C)   Resp 20   Ht 5' 2 (1.575 m)   Wt 157 lb 6 oz (71.4 kg)   LMP  (LMP Unknown)   SpO2 95%   BMI 28.78 kg/m    Physical Exam Constitutional:      Appearance: Normal appearance. She is normal weight.  Cardiovascular:     Rate and Rhythm: Normal rate and regular rhythm.  Pulmonary:     Effort: Pulmonary effort is normal.     Breath sounds: Normal breath sounds.  Skin:        Comments: Small ganglion cyst noted to the right hand. Mildly tender. Visually present when she makes a fist and disappears with an open hand.  Neurological:     General: No focal deficit present.     Mental  Status: She is alert and oriented to person, place, and time. Mental status is at baseline.  Psychiatric:        Mood and Affect: Mood normal.        Behavior: Behavior normal.     No results found for any visits on 12/28/23.      Assessment & Plan:   Problem List Items Addressed This Visit   None Visit Diagnoses       Cyst of joint of hand, right    -  Primary   Relevant Orders   Ambulatory referral to Hand Surgery     Ganglion cyst of finger of right hand          Refer to hands specialist due to the location of the cyst to have it removed.  No orders of the defined types were placed in this encounter.   No follow-ups on file.  Kasey Hansell B Chaselyn Nanney, FNP

## 2023-12-30 ENCOUNTER — Telehealth (HOSPITAL_COMMUNITY): Payer: Self-pay | Admitting: *Deleted

## 2023-12-30 DIAGNOSIS — F900 Attention-deficit hyperactivity disorder, predominantly inattentive type: Secondary | ICD-10-CM

## 2023-12-30 MED ORDER — METHYLPHENIDATE HCL 5 MG PO TABS: ORAL_TABLET | ORAL | 0 refills | Status: DC

## 2023-12-30 NOTE — Telephone Encounter (Signed)
 Sent. She can pick up on her due date.

## 2023-12-30 NOTE — Telephone Encounter (Signed)
 Pt called to request a refill of the Ritalin  5 mg qam.  Last visit: 11/19/23 Next visit: 02/18/24

## 2024-01-06 DIAGNOSIS — M67441 Ganglion, right hand: Secondary | ICD-10-CM | POA: Diagnosis not present

## 2024-01-28 ENCOUNTER — Telehealth (HOSPITAL_COMMUNITY): Payer: Self-pay | Admitting: *Deleted

## 2024-01-28 ENCOUNTER — Encounter: Payer: Medicare Other | Admitting: Family Medicine

## 2024-01-28 ENCOUNTER — Encounter (HOSPITAL_COMMUNITY): Payer: Self-pay | Admitting: *Deleted

## 2024-01-28 DIAGNOSIS — F900 Attention-deficit hyperactivity disorder, predominantly inattentive type: Secondary | ICD-10-CM

## 2024-01-28 MED ORDER — METHYLPHENIDATE HCL 5 MG PO TABS: ORAL_TABLET | ORAL | 0 refills | Status: DC

## 2024-01-28 NOTE — Telephone Encounter (Signed)
 Prescription sent to her pharmacy.  Please inform the patient she can pick up on the due date.

## 2024-01-28 NOTE — Telephone Encounter (Signed)
 Pt requests refill of the Ritalin  5 mg. Last e-scribed on 01/01/24.  Last visit: 11/19/23 Next visit: 02/18/24

## 2024-02-13 ENCOUNTER — Other Ambulatory Visit (HOSPITAL_COMMUNITY): Payer: Self-pay | Admitting: Psychiatry

## 2024-02-13 DIAGNOSIS — F331 Major depressive disorder, recurrent, moderate: Secondary | ICD-10-CM

## 2024-02-14 DIAGNOSIS — Z961 Presence of intraocular lens: Secondary | ICD-10-CM | POA: Diagnosis not present

## 2024-02-14 DIAGNOSIS — H43813 Vitreous degeneration, bilateral: Secondary | ICD-10-CM | POA: Diagnosis not present

## 2024-02-14 DIAGNOSIS — H04203 Unspecified epiphora, bilateral lacrimal glands: Secondary | ICD-10-CM | POA: Diagnosis not present

## 2024-02-18 ENCOUNTER — Telehealth (HOSPITAL_COMMUNITY): Admitting: Psychiatry

## 2024-02-18 ENCOUNTER — Encounter (HOSPITAL_COMMUNITY): Payer: Self-pay | Admitting: Psychiatry

## 2024-02-18 VITALS — Wt 157.0 lb

## 2024-02-18 DIAGNOSIS — F900 Attention-deficit hyperactivity disorder, predominantly inattentive type: Secondary | ICD-10-CM | POA: Diagnosis not present

## 2024-02-18 DIAGNOSIS — F331 Major depressive disorder, recurrent, moderate: Secondary | ICD-10-CM | POA: Diagnosis not present

## 2024-02-18 MED ORDER — METHYLPHENIDATE HCL 5 MG PO TABS: ORAL_TABLET | ORAL | 0 refills | Status: DC

## 2024-02-18 MED ORDER — TRAZODONE HCL 50 MG PO TABS: 50.0000 mg | ORAL_TABLET | Freq: Every evening | ORAL | 0 refills | Status: DC | PRN

## 2024-02-18 MED ORDER — TRINTELLIX 20 MG PO TABS: 20.0000 mg | ORAL_TABLET | ORAL | 2 refills | Status: DC

## 2024-02-18 MED ORDER — LAMOTRIGINE 200 MG PO TABS: 200.0000 mg | ORAL_TABLET | ORAL | 2 refills | Status: DC

## 2024-02-18 NOTE — Progress Notes (Signed)
 [facility] MD Virtual Progress Note   Patient Location: Home Provider Location: Home Office  I connect with patient by video and verified that I am speaking with correct person by using two identifiers. I discussed the limitations of evaluation and management by telemedicine and the availability of in person appointments. I also discussed with the patient that there may be a patient responsible charge related to this service. The patient expressed understanding and agreed to proceed.  [name] [id number] 78 y.o.  02/18/2024 10:51 AM  History of Present Illness:  Patient is evaluated by video session.  She reported things are going very well.  She had a good summer.  She reported family is doing very well.  She is sleeping good and medicine working for her.  She denies any irritability, crying spells, feeling of hopelessness or worthlessness.  Her attention concentration is good.  She has no tremor or shakes or any EPS.  She sleeps good with the help of trazodone .  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  Her appetite is okay.  Her weight is stable.  Patient lives with her husband who is very supportive.  She denies drinking or using any illegal substances.  Past Psychiatric History: H/O mania, irritability, impulsive behavior and overdose at age 33. H/O inpatient twice. Had psychological testing and diagnosed ADD. Took Concerta , (Adderall, Vyvanse  made anxious) Wellbutrin, Prozac, lithium, Effexor , Lexapro, Abilify and Cymbalta .  Best when given female hormone to help endometriosis.  Had ECT, did not work. TMS helped. No history of psychosis.    Past Medical History:  Diagnosis Date   Allergy    Anemia 03/08/2017   Carcinoma (HCC)    Cataract    Both removed   Colon cancer (HCC) 01/2017   Colon polyps    Diverticulitis    01/2018 abscess hosp. 01/2018   Endometriosis    Fibromyalgia    Gastric polyps     GERD (gastroesophageal reflux disease)    Hemorrhoids    Hypothyroidism    Iron  deficiency anemia 03/08/2017   Major depressive disorder, recurrent episode, mild    Dr. Curry in [place] h/o ECT x 6 x and hosp x 2 and transcranial magnetic treatment    Miscarriage    x2   PONV (postoperative nausea and vomiting)    Skin cancer    arm, thigh and chest Dr. Dela    Sleep apnea    USES CPAP   UTI (urinary tract infection)     Outpatient Encounter Medications as of 02/18/2024  Medication Sig   calcium-vitamin D  (OSCAL WITH D) 500-200 MG-UNIT tablet Take 2 tablets by mouth 2 (two) times daily.   cetirizine (ZYRTEC) 10 MG tablet Take 10 mg by mouth daily as needed for allergies.    estradiol  (ESTRACE ) 0.5 MG tablet Take 1 tablet (0.5 mg total) by mouth daily.   lamoTRIgine  (LAMICTAL ) 200 MG tablet Take 1 tablet (200 mg total) by mouth every morning.   levothyroxine  (SYNTHROID ) 25 MCG tablet Take 1 tablet (25 mcg total) by mouth daily before breakfast. 30 minutes   methylphenidate  (RITALIN ) 5 MG tablet Take one tab in am   Multiple Vitamins-Minerals (MULTIVITAMIN WITH MINERALS) tablet Take 1 tablet by mouth daily.   omeprazole  (PRILOSEC) 20 MG capsule TAKE ONE CAPSULE BY MOUTH DAILY 30 MINUTES PRIOR TO LUNCH. MUST WAIT 3-4 HOURS AFTER THYROID  PILL   ondansetron  (ZOFRAN -ODT) 4 MG disintegrating tablet Take 1  tablet (4 mg total) by mouth 2 (two) times daily as needed for nausea or vomiting.   Probiotic Product (PROBIOTIC PO) Take by mouth.   traZODone  (DESYREL ) 50 MG tablet Take 1 tablet (50 mg total) by mouth at bedtime as needed for sleep.   TRINTELLIX  20 MG TABS tablet Take 1 tablet (20 mg total) by mouth every morning.   No facility-administered encounter medications on file as of 02/18/2024.    No results found for this or any previous visit (from the past 2160 hours).   Psychiatric Specialty Exam: Physical Exam  Review of Systems  Weight 157 lb (71.2 kg).There is no height or weight  on file to calculate BMI.  General Appearance: Casual  Eye Contact:  Good  Speech:  Clear and Coherent  Volume:  Normal  Mood:  Euthymic  Affect:  Appropriate  Thought Process:  Goal Directed  Orientation:  Full (Time, Place, and Person)  Thought Content:  WDL  Suicidal Thoughts:  No  Homicidal Thoughts:  No  Memory:  Immediate;   Good Recent;   Good Remote;   Good  Judgement:  Good  Insight:  Present  Psychomotor Activity:  Normal  Concentration:  Concentration: Good and Attention Span: Good  Recall:  Good  Fund of Knowledge:  Good  Language:  Good  Akathisia:  No  Handed:  Right  AIMS (if indicated):     Assets:  Communication Skills Desire for Improvement Housing Resilience Social Support Talents/Skills Transportation  ADL's:  Intact  Cognition:  WNL  Sleep: Good       11/11/2023   11:24 AM 09/06/2023    9:39 AM 01/26/2023    1:43 PM 08/11/2022    2:16 PM 07/22/2022   12:57 PM  Depression screen PHQ 2/9  Decreased Interest 1 0 1 0 0  Down, Depressed, Hopeless 1 0 0 0 0  PHQ - 2 Score 2 0 1 0 0  Altered sleeping 0 2 1    Tired, decreased energy 1 1 1     Change in appetite 2 1 1     Feeling bad or failure about yourself  0 0 0    Trouble concentrating 1 0 0    Moving slowly or fidgety/restless 0 0 0    Suicidal thoughts 0 0 0    PHQ-9 Score 6 4 4     Difficult doing work/chores Not difficult at all Somewhat difficult Not difficult at all      Assessment/Plan: Moderate episode of recurrent major depressive disorder (HCC) - Plan: lamoTRIgine  (LAMICTAL ) 200 MG tablet, traZODone  (DESYREL ) 50 MG tablet, TRINTELLIX  20 MG TABS tablet  Attention deficit hyperactivity disorder (ADHD), predominantly inattentive type - Plan: methylphenidate  (RITALIN ) 5 MG tablet  Patient is 78 year old Caucasian female with history of major depressive disorder and attention deficit disorder.  She is stable on current medication.  She has no tremor or shakes or any EPS.  She does not ask  early refills for her stimulants.  She has no rash or any itching.  Continue Lamictal  200 mg daily, trazodone  50 mg at bedtime, Trintellix  20 mg daily and Ritalin  5 mg every day.  Recommended to call back if she has any question or any concern.  Discussed stimulant abuse, tolerance and dependency.  Will follow-up in 3 months.   Follow Up Instructions:     I discussed the assessment and treatment plan with the patient. The patient was provided an opportunity to ask questions and all were answered. The patient agreed  with the plan and demonstrated an understanding of the instructions.   The patient was advised to call back or seek an in-person evaluation if the symptoms worsen or if the condition fails to improve as anticipated.    Collaboration of Care: Other provider involved in patient's care AEB notes are available in epic to review  Patient/Guardian was advised Release of Information must be obtained prior to any record release in order to collaborate their care with an outside provider. Patient/Guardian was advised if they have not already done so to contact the registration department to sign all necessary forms in order for us  to release information regarding their care.   Consent: Patient/Guardian gives verbal consent for treatment and assignment of benefits for services provided during this visit. Patient/Guardian expressed understanding and agreed to proceed.     Total encounter time 18 minutes which includes face-to-face time, chart reviewed, care coordination, order entry and documentation during this encounter.   Note: This document was prepared by [employer] voice dictation technology and any errors that results from this process are unintentional.    Leni ONEIDA Client, MD 02/18/2024

## 2024-02-28 DIAGNOSIS — Z23 Encounter for immunization: Secondary | ICD-10-CM | POA: Diagnosis not present

## 2024-03-08 ENCOUNTER — Telehealth (HOSPITAL_COMMUNITY): Payer: Self-pay | Admitting: *Deleted

## 2024-03-08 NOTE — Telephone Encounter (Signed)
 PA for Ritalin  5 mg tablets 1 every day submitted to [employer] via Cover My Meds portal, and has been Approved.   Approval valid from 03/08/24 through 03/08/25.

## 2024-04-06 ENCOUNTER — Telehealth (HOSPITAL_COMMUNITY): Payer: Self-pay | Admitting: *Deleted

## 2024-04-06 DIAGNOSIS — F900 Attention-deficit hyperactivity disorder, predominantly inattentive type: Secondary | ICD-10-CM

## 2024-04-06 MED ORDER — METHYLPHENIDATE HCL 5 MG PO TABS: ORAL_TABLET | ORAL | 0 refills | Status: DC

## 2024-04-06 NOTE — Telephone Encounter (Signed)
 Prescription sent to the pharmacy.  Please inform the patient.  Thank you.

## 2024-04-06 NOTE — Telephone Encounter (Signed)
 Pt is requesting a refill of Ritalin  5 mg every day. Last e-scribed on 02/28/24.   Last visit: 02/18/24 Next visit: 05/20/23

## 2024-04-16 ENCOUNTER — Encounter: Payer: Self-pay | Admitting: Nurse Practitioner

## 2024-04-16 ENCOUNTER — Telehealth: Admitting: Family

## 2024-04-16 DIAGNOSIS — J101 Influenza due to other identified influenza virus with other respiratory manifestations: Secondary | ICD-10-CM | POA: Diagnosis not present

## 2024-04-16 DIAGNOSIS — R6889 Other general symptoms and signs: Secondary | ICD-10-CM | POA: Diagnosis not present

## 2024-04-16 MED ORDER — BENZONATATE 100 MG PO CAPS: 100.0000 mg | ORAL_CAPSULE | Freq: Two times a day (BID) | ORAL | 0 refills | Status: AC | PRN

## 2024-04-16 MED ORDER — OSELTAMIVIR PHOSPHATE 75 MG PO CAPS: 75.0000 mg | ORAL_CAPSULE | Freq: Two times a day (BID) | ORAL | 0 refills | Status: AC

## 2024-04-16 NOTE — Patient Instructions (Signed)

## 2024-04-16 NOTE — Progress Notes (Signed)
 Virtual Visit Consent   [name], you are scheduled for a virtual visit with a [facility] provider today. Just as with appointments in the office, your consent must be obtained to participate. Your consent will be active for this visit and any virtual visit you may have with one of our providers in the next 365 days. If you have a MyChart account, a copy of this consent can be sent to you electronically.  As this is a virtual visit, video technology does not allow for your provider to perform a traditional examination. This may limit your provider's ability to fully assess your condition. If your provider identifies any concerns that need to be evaluated in person or the need to arrange testing (such as labs, EKG, etc.), we will make arrangements to do so. Although advances in technology are sophisticated, we cannot ensure that it will always work on either your end or our end. If the connection with a video visit is poor, the visit may have to be switched to a telephone visit. With either a video or telephone visit, we are not always able to ensure that we have a secure connection.  By engaging in this virtual visit, you consent to the provision of healthcare and authorize for your insurance to be billed (if applicable) for the services provided during this visit. Depending on your insurance coverage, you may receive a charge related to this service.  I need to obtain your verbal consent now. Are you willing to proceed with your visit today? [name] has provided verbal consent on 04/16/2024 for a virtual visit (video or telephone). Bari Learn, FNP  Date: 04/16/2024 7:15 PM   Virtual Visit via Video Note   I, Bari Learn, connected with  [name]  ([id number], 04-Jan-1946) on 04/16/2024 at  7:00 PM EST by a video-enabled telemedicine application and verified that I am speaking with the correct person using two identifiers.  Location: Patient:  Virtual Visit Location Patient: Home Provider: Virtual Visit Location Provider: Home Office   I discussed the limitations of evaluation and management by telemedicine and the availability of in person appointments. The patient expressed understanding and agreed to proceed.    History of Present Illness: [name] is a 78 y.o. who identifies as a female who was assigned female at birth, and is being seen today for flu like symptoms started two days ago. Tested at home and was positive for Flu A.   HPI: Influenza This is a new problem. The current episode started in the past 7 days. The problem has been gradually improving. Associated symptoms include chills, congestion, coughing, fatigue, a fever, headaches, myalgias, nausea and a sore throat. Pertinent negatives include no vomiting. She has tried acetaminophen  and oral narcotics for the symptoms. The treatment provided mild relief.    Problems:  Patient Active Problem List   Diagnosis Date Noted   Vitamin D  deficiency 11/11/2023   Breast cancer screening by mammogram 02/03/2023   Need for influenza vaccination 02/03/2023   Unspecified menopausal and perimenopausal disorder 01/26/2023   Annual physical exam 01/26/2023   Mood disorder 07/28/2022   Hot flashes 07/28/2022   Monoclonal B-cell lymphocytosis of undetermined significance 07/10/2022   Vaginitis 09/01/2021   Patellofemoral stress syndrome 07/28/2021   Stiffness of right knee 06/25/2021   Greater trochanteric pain syndrome 06/24/2021   Iliotibial band syndrome, right 06/24/2021   Barrett's esophagus 08/29/2020   Rectovaginal fistula 01/15/2020   Aortic atherosclerosis 01/15/2020   Hyperlipidemia  01/15/2020   Abnormal CT scan 01/05/2020   Multiple gastric polyps 01/05/2020   History of colon cancer 01/05/2020   Hypothyroidism 03/22/2018   Chronic nausea 03/22/2018   OSA on CPAP 03/22/2018   History of skin cancer 03/22/2018   Fatigue 03/22/2018   Fibromyalgia  03/22/2018   Diverticulitis large intestine 01/29/2018   Iron  deficiency anemia 03/08/2017   Cancer of right colon (HCC) 12/31/2016   MDD (major depressive disorder), recurrent episode, severe (HCC) 06/04/2014    Allergies: Allergies[1] Medications: Current Medications[2]  Observations/Objective: Patient is well-developed, well-nourished in no acute distress.  Resting comfortably  at home.  Head is normocephalic, atraumatic.  No labored breathing.  Speech is clear and coherent with logical content.  Patient is alert and oriented at baseline.  Dry nonproductive cough  Assessment and Plan: 1. Influenza A (Primary) - oseltamivir  (TAMIFLU ) 75 MG capsule; Take 1 capsule (75 mg total) by mouth 2 (two) times daily.  Dispense: 10 capsule; Refill: 0 - benzonatate  (TESSALON ) 100 MG capsule; Take 1 capsule (100 mg total) by mouth 2 (two) times daily as needed for cough.  Dispense: 20 capsule; Refill: 0  2. Flu-like symptoms  - Take meds as prescribed - Use a cool mist humidifier  -Use saline nose sprays frequently -Force fluids -For any cough or congestion  Use plain Mucinex- regular strength or max strength is fine -For fever or aces or pains- take tylenol  or ibuprofen . -Throat lozenges if help -Follow up if symptoms worsen or do not improve   Follow Up Instructions: I discussed the assessment and treatment plan with the patient. The patient was provided an opportunity to ask questions and all were answered. The patient agreed with the plan and demonstrated an understanding of the instructions.  A copy of instructions were sent to the patient via MyChart unless otherwise noted below.     The patient was advised to call back or seek an in-person evaluation if the symptoms worsen or if the condition fails to improve as anticipated.    Bari Learn, FNP     [1]  Allergies Allergen Reactions   Other     BAND-AIDS IF LEFT ON FOR EXTENDED PERIODS-REDNESS   Codeine     Nausea   [2]  Current Outpatient Medications:    benzonatate  (TESSALON ) 100 MG capsule, Take 1 capsule (100 mg total) by mouth 2 (two) times daily as needed for cough., Disp: 20 capsule, Rfl: 0   oseltamivir  (TAMIFLU ) 75 MG capsule, Take 1 capsule (75 mg total) by mouth 2 (two) times daily., Disp: 10 capsule, Rfl: 0   calcium-vitamin D  (OSCAL WITH D) 500-200 MG-UNIT tablet, Take 2 tablets by mouth 2 (two) times daily., Disp: , Rfl:    cetirizine (ZYRTEC) 10 MG tablet, Take 10 mg by mouth daily as needed for allergies. , Disp: , Rfl:    estradiol  (ESTRACE ) 0.5 MG tablet, Take 1 tablet (0.5 mg total) by mouth daily., Disp: 90 tablet, Rfl: 3   lamoTRIgine  (LAMICTAL ) 200 MG tablet, Take 1 tablet (200 mg total) by mouth every morning., Disp: 30 tablet, Rfl: 2   levothyroxine  (SYNTHROID ) 25 MCG tablet, Take 1 tablet (25 mcg total) by mouth daily before breakfast. 30 minutes, Disp: 90 tablet, Rfl: 3   methylphenidate  (RITALIN ) 5 MG tablet, Take one tab in am, Disp: 30 tablet, Rfl: 0   Multiple Vitamins-Minerals (MULTIVITAMIN WITH MINERALS) tablet, Take 1 tablet by mouth daily., Disp: , Rfl:    omeprazole  (PRILOSEC) 20 MG capsule, TAKE ONE CAPSULE  BY MOUTH DAILY 30 MINUTES PRIOR TO LUNCH. MUST WAIT 3-4 HOURS AFTER THYROID  PILL, Disp: 90 capsule, Rfl: 3   ondansetron  (ZOFRAN -ODT) 4 MG disintegrating tablet, Take 1 tablet (4 mg total) by mouth 2 (two) times daily as needed for nausea or vomiting., Disp: 180 tablet, Rfl: 3   Probiotic Product (PROBIOTIC PO), Take by mouth., Disp: , Rfl:    traZODone  (DESYREL ) 50 MG tablet, Take 1 tablet (50 mg total) by mouth at bedtime as needed for sleep., Disp: 90 tablet, Rfl: 0   TRINTELLIX  20 MG TABS tablet, Take 1 tablet (20 mg total) by mouth every morning., Disp: 30 tablet, Rfl: 2

## 2024-05-01 ENCOUNTER — Emergency Department

## 2024-05-01 ENCOUNTER — Emergency Department
Admission: EM | Admit: 2024-05-01 | Discharge: 2024-05-01 | Disposition: A | Discharge status: Home / Self Care | Attending: Emergency Medicine | Admitting: Emergency Medicine

## 2024-05-01 ENCOUNTER — Other Ambulatory Visit: Payer: Self-pay

## 2024-05-01 DIAGNOSIS — E039 Hypothyroidism, unspecified: Secondary | ICD-10-CM | POA: Diagnosis not present

## 2024-05-01 DIAGNOSIS — R0789 Other chest pain: Secondary | ICD-10-CM | POA: Diagnosis present

## 2024-05-01 LAB — BASIC METABOLIC PANEL WITH GFR
Anion gap: 9 (ref 5–15)
BUN: 8 mg/dL (ref 8–23)
CO2: 28 mmol/L (ref 22–32)
Calcium: 9.4 mg/dL (ref 8.9–10.3)
Chloride: 104 mmol/L (ref 98–111)
Creatinine, Ser: 1.06 mg/dL — ABNORMAL HIGH (ref 0.44–1.00)
GFR, Estimated: 54 mL/min — ABNORMAL LOW
Glucose, Bld: 97 mg/dL (ref 70–99)
Potassium: 4.2 mmol/L (ref 3.5–5.1)
Sodium: 141 mmol/L (ref 135–145)

## 2024-05-01 LAB — CBC
HCT: 40 % (ref 36.0–46.0)
Hemoglobin: 13.4 g/dL (ref 12.0–15.0)
MCH: 30.5 pg (ref 26.0–34.0)
MCHC: 33.5 g/dL (ref 30.0–36.0)
MCV: 91.1 fL (ref 80.0–100.0)
Platelets: 330 K/uL (ref 150–400)
RBC: 4.39 MIL/uL (ref 3.87–5.11)
RDW: 12.8 % (ref 11.5–15.5)
WBC: 9 K/uL (ref 4.0–10.5)
nRBC: 0 % (ref 0.0–0.2)

## 2024-05-01 LAB — D-DIMER, QUANTITATIVE: D-Dimer, Quant: 0.31 ug{FEU}/mL (ref 0.00–0.50)

## 2024-05-01 LAB — TROPONIN T, HIGH SENSITIVITY: Troponin T High Sensitivity: <15 ng/L (ref 0–19)

## 2024-05-01 NOTE — ED Provider Notes (Signed)
 "  [facility] Provider Note    Event Date/Time   First MD Initiated Contact with Patient 05/01/24 1623     (approximate)   History   Chest Pain   HPI  [name] is a 78 y.o. female with a history of OSA, hyperlipidemia, and hypothyroidism who presents with left upper chest pain for the last 2 days, persistent course, nonexertional, described as an ache.  She states her chest feels tight but denies significant shortness of breath.  She states that her cough is resolved after she had the flu a couple weeks ago.  She also reports generalized fatigue for the last 2 days.  She has no vomiting or diarrhea.  She denies any leg swelling.  I reviewed the past medical records.  The patient's most recent outpatient counter was with family medicine on 12/14 for influenza A.  She was treated with Tamiflu .  Physical Exam   Triage Vital Signs: ED Triage Vitals [05/01/24 1402]  Encounter Vitals Group     BP (!) 139/52     Girls Systolic BP Percentile      Girls Diastolic BP Percentile      Boys Systolic BP Percentile      Boys Diastolic BP Percentile      Pulse Rate 72     Resp 18     Temp 98.7 F (37.1 C)     Temp src      SpO2 99 %     Weight 153 lb (69.4 kg)     Height 5' 2 (1.575 m)     Head Circumference      Peak Flow      Pain Score 2     Pain Loc      Pain Education      Exclude from Growth Chart     Most recent vital signs: Vitals:   05/01/24 1402  BP: (!) 139/52  Pulse: 72  Resp: 18  Temp: 98.7 F (37.1 C)  SpO2: 99%     General: Alert, well-appearing, no distress.  CV:  Good peripheral perfusion.  Normal heart sounds. Resp:  Normal effort.  Lungs CTAB. Abd:  No distention.  Other:  No peripheral edema.   ED Results / Procedures / Treatments   Labs (all labs ordered are listed, but only abnormal results are displayed) Labs Reviewed  BASIC METABOLIC PANEL WITH GFR - Abnormal; Notable for the following components:       Result Value   Creatinine, Ser 1.06 (*)    GFR, Estimated 54 (*)    All other components within normal limits  CBC  D-DIMER, QUANTITATIVE  TROPONIN T, HIGH SENSITIVITY     EKG  ED ECG REPORT I, Waylon Cassis, the attending physician, personally viewed and interpreted this ECG.  Date: 05/01/2024 EKG Time: 1401 Rate: 69 Rhythm: normal sinus rhythm QRS Axis: Left axis Intervals: normal ST/T Wave abnormalities: normal Narrative Interpretation: no evidence of acute ischemia    RADIOLOGY  Chest x-ray: I independently viewed and interpreted the images; there is no focal consolidation or edema  PROCEDURES:  Critical Care performed: No  Procedures   MEDICATIONS ORDERED IN ED: Medications - No data to display   IMPRESSION / MDM / ASSESSMENT AND PLAN / ED COURSE  I reviewed the triage vital signs and the nursing notes.  78 year old female with PMH as noted above presents with atypical, nonexertional chest pain for the last 2 days associated with fatigue.  On exam her  vital signs are normal.  Physical exam is unremarkable for acute findings.  EKG is nonischemic.  Chest x-ray is clear.  BMP and CBC show no acute findings.  Troponin is negative.  Differential diagnosis includes, but is not limited to, musculoskeletal chest wall pain, bronchospasm or bronchitis, GERD, less likely PE.  There is no clinical evidence for ACS.  Given the duration of the symptoms there is no indication for serial troponins.  The patient cannot be ruled out for PE by [facility] given her age.  Given the component of fatigue, I feel it would be prudent to rule out a PE.  I have ordered a D-dimer.  Patient's presentation is most consistent with acute complicated illness / injury requiring diagnostic workup.  The patient is on the cardiac monitor to evaluate for evidence of arrhythmia and/or significant heart rate changes.  ----------------------------------------- 6:52 PM on  05/01/2024 -----------------------------------------  D-dimer is negative.  On reassessment the patient continues to appear comfortable.  There is no indication for further ED workup.  She is eager to go home.  She is stable for discharge at this time.  I counseled her on the results of the workup and answered all of her questions.  I gave strict return precautions, and she expressed understanding.   FINAL CLINICAL IMPRESSION(S) / ED DIAGNOSES   Final diagnoses:  Atypical chest pain     Rx / DC Orders   ED Discharge Orders     None        Note:  This document was prepared using Dragon voice recognition software and may include unintentional dictation errors.    Jacolyn Pae, MD 05/01/24 1853  "

## 2024-05-01 NOTE — Discharge Instructions (Signed)
 Your ER workup is reassuring.  Follow-up with your primary care provider.  Return to the ER immediately for new, worsening, or persistent severe chest pain, difficulty breathing, fatigue or weakness, or any other new or worsening symptoms that concern you.

## 2024-05-01 NOTE — ED Triage Notes (Signed)
 ARrives from [facility] for ED evaluation for Left sided chest pain and SOB. VS wnl.

## 2024-05-01 NOTE — ED Triage Notes (Signed)
 Pt comes with left upper cp for few days and extreme tiredness. Pt states flu early Dec an finally quit coughing. Pt states productive cough and its clear. Pt states no sob.

## 2024-05-02 ENCOUNTER — Telehealth (HOSPITAL_COMMUNITY): Payer: Self-pay

## 2024-05-02 DIAGNOSIS — F900 Attention-deficit hyperactivity disorder, predominantly inattentive type: Secondary | ICD-10-CM

## 2024-05-02 NOTE — Telephone Encounter (Signed)
 Patient is calling for a refill on her RItalin , she is not due until the 4th

## 2024-05-07 MED ORDER — METHYLPHENIDATE HCL 5 MG PO TABS: ORAL_TABLET | ORAL | 0 refills | Status: DC

## 2024-05-07 NOTE — Telephone Encounter (Signed)
 Prescription sent to the pharmacy.  Please inform the patient.  Thank you.

## 2024-05-10 ENCOUNTER — Other Ambulatory Visit (INDEPENDENT_AMBULATORY_CARE_PROVIDER_SITE_OTHER)

## 2024-05-10 DIAGNOSIS — E785 Hyperlipidemia, unspecified: Secondary | ICD-10-CM | POA: Diagnosis not present

## 2024-05-10 LAB — LIPID PANEL
Cholesterol: 190 mg/dL (ref 28–200)
HDL: 63.9 mg/dL
LDL Cholesterol: 106 mg/dL — ABNORMAL HIGH (ref 10–99)
NonHDL: 126.43
Total CHOL/HDL Ratio: 3
Triglycerides: 103 mg/dL (ref 10.0–149.0)
VLDL: 20.6 mg/dL (ref 0.0–40.0)

## 2024-05-10 LAB — COMPREHENSIVE METABOLIC PANEL WITH GFR
ALT: 11 U/L (ref 3–35)
AST: 13 U/L (ref 5–37)
Albumin: 3.8 g/dL (ref 3.5–5.2)
Alkaline Phosphatase: 54 U/L (ref 39–117)
BUN: 13 mg/dL (ref 6–23)
CO2: 31 meq/L (ref 19–32)
Calcium: 8.9 mg/dL (ref 8.4–10.5)
Chloride: 105 meq/L (ref 96–112)
Creatinine, Ser: 1 mg/dL (ref 0.40–1.20)
GFR: 54.56 mL/min — ABNORMAL LOW
Glucose, Bld: 87 mg/dL (ref 70–99)
Potassium: 4.4 meq/L (ref 3.5–5.1)
Sodium: 140 meq/L (ref 135–145)
Total Bilirubin: 0.5 mg/dL (ref 0.2–1.2)
Total Protein: 5.8 g/dL — ABNORMAL LOW (ref 6.0–8.3)

## 2024-05-11 ENCOUNTER — Ambulatory Visit: Payer: Self-pay | Admitting: Nurse Practitioner

## 2024-05-17 ENCOUNTER — Other Ambulatory Visit (HOSPITAL_COMMUNITY): Payer: Self-pay | Admitting: Psychiatry

## 2024-05-17 DIAGNOSIS — F331 Major depressive disorder, recurrent, moderate: Secondary | ICD-10-CM

## 2024-05-19 ENCOUNTER — Encounter (HOSPITAL_COMMUNITY): Payer: Self-pay | Admitting: Psychiatry

## 2024-05-19 ENCOUNTER — Telehealth (HOSPITAL_COMMUNITY): Admitting: Psychiatry

## 2024-05-19 DIAGNOSIS — F331 Major depressive disorder, recurrent, moderate: Secondary | ICD-10-CM

## 2024-05-19 DIAGNOSIS — F900 Attention-deficit hyperactivity disorder, predominantly inattentive type: Secondary | ICD-10-CM | POA: Diagnosis not present

## 2024-05-19 MED ORDER — METHYLPHENIDATE HCL 5 MG PO TABS: ORAL_TABLET | ORAL | 0 refills | Status: AC

## 2024-05-19 MED ORDER — TRINTELLIX 20 MG PO TABS: 20.0000 mg | ORAL_TABLET | ORAL | 2 refills | Status: AC

## 2024-05-19 MED ORDER — TRAZODONE HCL 50 MG PO TABS: 50.0000 mg | ORAL_TABLET | Freq: Every evening | ORAL | 0 refills | Status: AC | PRN

## 2024-05-19 MED ORDER — LAMOTRIGINE 200 MG PO TABS: 200.0000 mg | ORAL_TABLET | ORAL | 2 refills | Status: AC

## 2024-05-19 NOTE — Progress Notes (Signed)
 " [facility] MD Virtual Progress Note   Patient Location: Home Provider Location: Home Office  I connect with patient by video and verified that I am speaking with correct person by using two identifiers. I discussed the limitations of evaluation and management by telemedicine and the availability of in person appointments. I also discussed with the patient that there may be a patient responsible charge related to this service. The patient expressed understanding and agreed to proceed.  [name] [id number] 79 y.o.  05/19/2024 10:07 AM  History of Present Illness:  Patient is evaluated by video session.  She reported had a good holidays and Christmas.  Unfortunately could not see son who lives in [place] because he was sick.  Patient also had a flulike symptoms and seen in the emergency room because of the chest pain.  She was concerned if develop pneumonia but labs were okay and she was sent home.  She reported things are going well and she is happy with the current medication.  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  She denies any crying spells or any feeling of hopelessness or worthlessness.  She denies any agitation, suicidal thoughts.  Her appetite is okay and her weight is stable.  Her energy level is good.  She wants to keep the current medication which is helping her anxiety depression.  She denies drinking or using any illegal substances.  She reported Ritalin  helps her attention and focus and multitasking.  There are days when she forgot to take the Ritalin  and she could not function and she was sleeping all day until she realized that she had missed the medication.  She denies any tremors, shakes or any EPS.  She has no rash or any itching.  Past Psychiatric History: H/O mania, irritability, impulsive behavior and overdose at age 61. H/O  inpatient twice. Had psychological testing and diagnosed ADD. Took Concerta , (Adderall, Vyvanse  made anxious) Wellbutrin, Prozac, lithium, Effexor , Lexapro, Abilify and Cymbalta .  Best when given female hormone to help endometriosis.  Had ECT, did not work. TMS helped. No history of psychosis.    Past Medical History:  Diagnosis Date   Allergy    Anemia 03/08/2017   Carcinoma (HCC)    Cataract    Both removed   Colon cancer (HCC) 01/2017   Colon polyps    Diverticulitis    01/2018 abscess hosp. 01/2018   Endometriosis    Fibromyalgia    Gastric polyps    GERD (gastroesophageal reflux disease)    Hemorrhoids    Hypothyroidism    Iron  deficiency anemia 03/08/2017   Major depressive disorder, recurrent episode, mild    Dr. Curry in [place] h/o ECT x 6 x and hosp x 2 and transcranial magnetic treatment    Miscarriage    x2   PONV (postoperative nausea and vomiting)    Skin cancer    arm, thigh and chest Dr. Dela    Sleep apnea    USES CPAP   UTI (urinary tract infection)     Outpatient Encounter Medications as of 05/19/2024  Medication Sig   benzonatate  (TESSALON ) 100 MG capsule Take 1 capsule (100 mg total) by mouth 2 (two) times daily as needed for cough.   calcium-vitamin D  (OSCAL WITH D) 500-200 MG-UNIT tablet Take 2 tablets by mouth 2 (two) times daily.   cetirizine (ZYRTEC)  10 MG tablet Take 10 mg by mouth daily as needed for allergies.    estradiol  (ESTRACE ) 0.5 MG tablet Take 1 tablet (0.5 mg total) by mouth daily.   lamoTRIgine  (LAMICTAL ) 200 MG tablet Take 1 tablet (200 mg total) by mouth every morning.   levothyroxine  (SYNTHROID ) 25 MCG tablet Take 1 tablet (25 mcg total) by mouth daily before breakfast. 30 minutes   methylphenidate  (RITALIN ) 5 MG tablet Take one tab in am   Multiple Vitamins-Minerals (MULTIVITAMIN WITH MINERALS) tablet Take 1 tablet by mouth daily.   omeprazole  (PRILOSEC) 20 MG capsule TAKE ONE CAPSULE BY MOUTH DAILY 30 MINUTES PRIOR TO LUNCH. MUST WAIT 3-4  HOURS AFTER THYROID  PILL   ondansetron  (ZOFRAN -ODT) 4 MG disintegrating tablet Take 1 tablet (4 mg total) by mouth 2 (two) times daily as needed for nausea or vomiting.   oseltamivir  (TAMIFLU ) 75 MG capsule Take 1 capsule (75 mg total) by mouth 2 (two) times daily.   Probiotic Product (PROBIOTIC PO) Take by mouth.   traZODone  (DESYREL ) 50 MG tablet Take 1 tablet (50 mg total) by mouth at bedtime as needed for sleep.   TRINTELLIX  20 MG TABS tablet Take 1 tablet (20 mg total) by mouth every morning.   No facility-administered encounter medications on file as of 05/19/2024.    Recent Results (from the past 2160 hours)  Basic metabolic panel     Status: Abnormal   Collection Time: 05/01/24  2:02 PM  Result Value Ref Range   Sodium 141 135 - 145 mmol/L   Potassium 4.2 3.5 - 5.1 mmol/L   Chloride 104 98 - 111 mmol/L   CO2 28 22 - 32 mmol/L   Glucose, Bld 97 70 - 99 mg/dL    Comment: Glucose reference range applies only to samples taken after fasting for at least 8 hours.   BUN 8 8 - 23 mg/dL   Creatinine, Ser 8.93 (H) 0.44 - 1.00 mg/dL   Calcium 9.4 8.9 - 89.6 mg/dL   GFR, Estimated 54 (L) >60 mL/min    Comment: (NOTE) Calculated using the CKD-EPI Creatinine Equation (2021)    Anion gap 9 5 - 15    Comment: Performed at [facility], [address] [place], [place] [address]  CBC     Status: None   Collection Time: 05/01/24  2:02 PM  Result Value Ref Range   WBC 9.0 4.0 - 10.5 K/uL   RBC 4.39 3.87 - 5.11 MIL/uL   Hemoglobin 13.4 12.0 - 15.0 g/dL   HCT 59.9 63.9 - 53.9 %   MCV 91.1 80.0 - 100.0 fL   MCH 30.5 26.0 - 34.0 pg   MCHC 33.5 30.0 - 36.0 g/dL   RDW 87.1 88.4 - 84.4 %   Platelets 330 150 - 400 K/uL   nRBC 0.0 0.0 - 0.2 %    Comment: Performed at [facility], [address] [place], [place] [address]  Troponin T, High Sensitivity     Status: None   Collection Time: 05/01/24  2:02 PM  Result Value Ref Range   Troponin T High Sensitivity <15 0 - 19  ng/L    Comment: (NOTE) Biotin concentrations > 1000 ng/mL falsely decrease TnT results.  Serial cardiac troponin measurements are suggested.  Refer to the Links section for chest pain algorithms and additional  guidance. Performed at [facility], [address], [place], [place] [address]   D-dimer, quantitative     Status: None   Collection Time:  05/01/24  6:16 PM  Result Value Ref Range   D-Dimer, Quant 0.31 0.00 - 0.50 ug/mL-FEU    Comment: (NOTE) At the manufacturer cut-off value of 0.5 g/mL FEU, this assay has a negative predictive value of 95-100%.This assay is intended for use in conjunction with a clinical pretest probability (PTP) assessment model to exclude pulmonary embolism (PE) and deep venous thrombosis (DVT) in outpatients suspected of PE or DVT. Results should be correlated with clinical presentation. Performed at [facility], [address] [place], [place] [address]   Lipid panel     Status: Abnormal   Collection Time: 05/10/24  8:27 AM  Result Value Ref Range   Cholesterol 190 28 - 200 mg/dL    Comment: ATP III Classification       Desirable:  < 200 mg/dL               Borderline High:  200 - 239 mg/dL          High:  > = 759 mg/dL   Triglycerides 896.9 89.9 - 149.0 mg/dL    Comment: Normal:  <849 mg/dLBorderline High:  150 - 199 mg/dL   HDL 36.09 >60.99 mg/dL   VLDL 79.3 0.0 - 59.9 mg/dL   LDL Cholesterol 893 (H) 10 - 99 mg/dL   Total CHOL/HDL Ratio 3     Comment:                Men          Women1/2 Average Risk     3.4          3.3Average Risk          5.0          4.42X Average Risk          9.6          7.13X Average Risk          15.0          11.0                       NonHDL 126.43     Comment: NOTE:  Non-HDL goal should be 30 mg/dL higher than patient's LDL goal (i.e. LDL goal of < 70 mg/dL, would have non-HDL goal of < 100 mg/dL)  Comprehensive metabolic panel with GFR     Status: Abnormal   Collection Time: 05/10/24  8:27  AM  Result Value Ref Range   Sodium 140 135 - 145 mEq/L   Potassium 4.4 3.5 - 5.1 mEq/L   Chloride 105 96 - 112 mEq/L   CO2 31 19 - 32 mEq/L    Comment: Elevated LDH levels may cause falsely increased CO2 results. If LDH is >2000 U/L, a positive bias of 12% is possible.   Glucose, Bld 87 70 - 99 mg/dL   BUN 13 6 - 23 mg/dL   Creatinine, Ser 8.99 0.40 - 1.20 mg/dL   Total Bilirubin 0.5 0.2 - 1.2 mg/dL   Alkaline Phosphatase 54 39 - 117 U/L   AST 13 5 - 37 U/L   ALT 11 3 - 35 U/L   Total Protein 5.8 (L) 6.0 - 8.3 g/dL   Albumin 3.8 3.5 - 5.2 g/dL   GFR 45.43 (L) >39.99 mL/min    Comment: Calculated using the CKD-EPI Creatinine Equation (2021)   Calcium 8.9 8.4 - 10.5 mg/dL     Psychiatric Specialty Exam: Physical Exam  Review of Systems  Weight 150 lb (68 kg).There is no height or weight on file to calculate BMI.  General Appearance: Casual  Eye Contact:  Good  Speech:  Clear and Coherent  Volume:  Normal  Mood:  Pleasant  Affect:  Appropriate  Thought Process:  Goal Directed  Orientation:  Full (Time, Place, and Person)  Thought Content:  WDL  Suicidal Thoughts:  No  Homicidal Thoughts:  No  Memory:  Immediate;   Good Recent;   Good Remote;   Good  Judgement:  Good  Insight:  Present  Psychomotor Activity:  Normal  Concentration:  Concentration: Good and Attention Span: Good  Recall:  Good  Fund of Knowledge:  Good  Language:  Good  Akathisia:  No  Handed:  Right  AIMS (if indicated):     Assets:  Communication Skills Desire for Improvement Housing Resilience Social Support Talents/Skills Transportation  ADL's:  Intact  Cognition:  WNL  Sleep: Good       11/11/2023   11:24 AM 09/06/2023    9:39 AM 01/26/2023    1:43 PM 08/11/2022    2:16 PM 07/22/2022   12:57 PM  Depression screen PHQ 2/9  Decreased Interest 1 0 1 0 0  Down, Depressed, Hopeless 1 0 0 0 0  PHQ - 2 Score 2 0 1 0 0  Altered sleeping 0 2 1    Tired, decreased energy 1 1 1     Change in  appetite 2 1 1     Feeling bad or failure about yourself  0 0 0    Trouble concentrating 1 0 0    Moving slowly or fidgety/restless 0 0 0    Suicidal thoughts 0 0 0    PHQ-9 Score 6  4  4      Difficult doing work/chores Not difficult at all Somewhat difficult Not difficult at all       Data saved with a previous flowsheet row definition    Assessment/Plan: Moderate episode of recurrent major depressive disorder (HCC) - Plan: lamoTRIgine  (LAMICTAL ) 200 MG tablet, traZODone  (DESYREL ) 50 MG tablet, TRINTELLIX  20 MG TABS tablet  Attention deficit hyperactivity disorder (ADHD), predominantly inattentive type - Plan: methylphenidate  (RITALIN ) 5 MG tablet  Patient is 79 year old Caucasian female with major depressive disorder, ADHD and currently stable on medication.  Review blood work results and collect information from recent emergency room visit.  She is doing better.  She has no more chest pain.  She like to keep the current medication.  She does not ask early refills.  Discussed stimulant abuse, dependency, tolerance and withdrawal.  So far no major concern.  Continue trazodone  50 mg at bedtime, Trintellix  20 mg daily, Ritalin  5 mg daily and Lamictal  200 mg daily.  Recommend to call back if she is any question or any concern.  Follow-up in 3 months.  Follow Up Instructions:     I discussed the assessment and treatment plan with the patient. The patient was provided an opportunity to ask questions and all were answered. The patient agreed with the plan and demonstrated an understanding of the instructions.   The patient was advised to call back or seek an in-person evaluation if the symptoms worsen or if the condition fails to improve as anticipated.    Collaboration of Care: Other provider involved in patient's care AEB notes are available in epic to review  Patient/Guardian was advised Release of Information must be obtained prior to any record release in order to collaborate their care  with  an outside provider. Patient/Guardian was advised if they have not already done so to contact the registration department to sign all necessary forms in order for us  to release information regarding their care.   Consent: Patient/Guardian gives verbal consent for treatment and assignment of benefits for services provided during this visit. Patient/Guardian expressed understanding and agreed to proceed.     Total encounter time 17 minutes which includes face-to-face time, chart reviewed, care coordination, order entry and documentation during this encounter.   Note: This document was prepared by [employer] voice dictation technology and any errors that results from this process are unintentional.    Leni ONEIDA Client, MD 05/19/2024   "

## 2024-05-24 ENCOUNTER — Other Ambulatory Visit (HOSPITAL_COMMUNITY): Payer: Self-pay | Admitting: Psychiatry

## 2024-05-24 DIAGNOSIS — F331 Major depressive disorder, recurrent, moderate: Secondary | ICD-10-CM

## 2024-06-12 ENCOUNTER — Ambulatory Visit: Admitting: Sleep Medicine

## 2024-07-13 ENCOUNTER — Other Ambulatory Visit

## 2024-07-18 ENCOUNTER — Ambulatory Visit: Admitting: Oncology

## 2024-08-25 ENCOUNTER — Telehealth (HOSPITAL_COMMUNITY): Admitting: Psychiatry

## 2024-09-08 ENCOUNTER — Ambulatory Visit

## 2024-11-14 ENCOUNTER — Ambulatory Visit: Admitting: Nurse Practitioner
